# Patient Record
Sex: Male | Born: 1935
Health system: Southern US, Community
[De-identification: ages and names within clinical notes are randomized; demographics above are authoritative.]

## PROBLEM LIST (undated history)

## (undated) DIAGNOSIS — C61 Malignant neoplasm of prostate: Secondary | ICD-10-CM

## (undated) DIAGNOSIS — I340 Nonrheumatic mitral (valve) insufficiency: Secondary | ICD-10-CM

## (undated) DIAGNOSIS — R351 Nocturia: Secondary | ICD-10-CM

## (undated) DIAGNOSIS — M76899 Other specified enthesopathies of unspecified lower limb, excluding foot: Secondary | ICD-10-CM

## (undated) DIAGNOSIS — E785 Hyperlipidemia, unspecified: Secondary | ICD-10-CM

## (undated) DIAGNOSIS — M1990 Unspecified osteoarthritis, unspecified site: Secondary | ICD-10-CM

## (undated) DIAGNOSIS — I499 Cardiac arrhythmia, unspecified: Secondary | ICD-10-CM

## (undated) DIAGNOSIS — I7409 Other arterial embolism and thrombosis of abdominal aorta: Secondary | ICD-10-CM

## (undated) DIAGNOSIS — R002 Palpitations: Secondary | ICD-10-CM

## (undated) DIAGNOSIS — M720 Palmar fascial fibromatosis [Dupuytren]: Secondary | ICD-10-CM

## (undated) DIAGNOSIS — K5900 Constipation, unspecified: Secondary | ICD-10-CM

## (undated) DIAGNOSIS — R350 Frequency of micturition: Secondary | ICD-10-CM

## (undated) DIAGNOSIS — E039 Hypothyroidism, unspecified: Secondary | ICD-10-CM

## (undated) DIAGNOSIS — R112 Nausea with vomiting, unspecified: Secondary | ICD-10-CM

## (undated) DIAGNOSIS — K219 Gastro-esophageal reflux disease without esophagitis: Secondary | ICD-10-CM

## (undated) DIAGNOSIS — S069X9A Unspecified intracranial injury with loss of consciousness of unspecified duration, initial encounter: Secondary | ICD-10-CM

## (undated) DIAGNOSIS — T4145XA Adverse effect of unspecified anesthetic, initial encounter: Secondary | ICD-10-CM

## (undated) DIAGNOSIS — R011 Cardiac murmur, unspecified: Secondary | ICD-10-CM

## (undated) DIAGNOSIS — I10 Essential (primary) hypertension: Secondary | ICD-10-CM

## (undated) DIAGNOSIS — Z9889 Other specified postprocedural states: Secondary | ICD-10-CM

## (undated) DIAGNOSIS — Q211 Atrial septal defect: Secondary | ICD-10-CM

## (undated) DIAGNOSIS — I779 Disorder of arteries and arterioles, unspecified: Secondary | ICD-10-CM

## (undated) DIAGNOSIS — M25569 Pain in unspecified knee: Secondary | ICD-10-CM

## (undated) DIAGNOSIS — N400 Enlarged prostate without lower urinary tract symptoms: Secondary | ICD-10-CM

## (undated) DIAGNOSIS — I739 Peripheral vascular disease, unspecified: Secondary | ICD-10-CM

## (undated) DIAGNOSIS — H25019 Cortical age-related cataract, unspecified eye: Secondary | ICD-10-CM

## (undated) HISTORY — DX: Constipation, unspecified: K59.00

## (undated) HISTORY — DX: Nonrheumatic mitral (valve) insufficiency: I34.0

## (undated) HISTORY — DX: Hyperlipidemia, unspecified: E78.5

## (undated) HISTORY — DX: Hypothyroidism, unspecified: E03.9

## (undated) HISTORY — DX: Essential (primary) hypertension: I10

## (undated) HISTORY — DX: Palpitations: R00.2

## (undated) HISTORY — DX: Pain in unspecified knee: M25.569

## (undated) HISTORY — DX: Benign prostatic hyperplasia without lower urinary tract symptoms: N40.0

## (undated) HISTORY — PX: COLONOSCOPY: SHX174

## (undated) HISTORY — DX: Malignant neoplasm of prostate: C61

## (undated) HISTORY — DX: Frequency of micturition: R35.0

## (undated) HISTORY — DX: Disorder of arteries and arterioles, unspecified: I77.9

## (undated) HISTORY — DX: Unspecified osteoarthritis, unspecified site: M19.90

## (undated) HISTORY — DX: Peripheral vascular disease, unspecified: I73.9

## (undated) HISTORY — DX: Cortical age-related cataract, unspecified eye: H25.019

## (undated) HISTORY — DX: Other specified enthesopathies of unspecified lower limb, excluding foot: M76.899

## (undated) HISTORY — DX: Nocturia: R35.1

## (undated) HISTORY — DX: Palmar fascial fibromatosis (dupuytren): M72.0

## (undated) HISTORY — DX: Other arterial embolism and thrombosis of abdominal aorta: I74.09

---
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
## 1966-10-22 HISTORY — PX: KNEE ARTHROSCOPY WITH MENISCAL REPAIR: SHX5653

## 1998-10-22 HISTORY — PX: PROSTATE SURGERY: SHX751

## 1999-10-23 DIAGNOSIS — S069X9A Unspecified intracranial injury with loss of consciousness of unspecified duration, initial encounter: Secondary | ICD-10-CM

## 1999-10-23 HISTORY — DX: Unspecified intracranial injury with loss of consciousness of unspecified duration, initial encounter: S06.9X9A

## 2004-10-22 HISTORY — PX: CATARACT EXTRACTION W/ INTRAOCULAR LENS  IMPLANT, BILATERAL: SHX1307

## 2008-08-14 DIAGNOSIS — R350 Frequency of micturition: Secondary | ICD-10-CM

## 2008-08-14 HISTORY — DX: Frequency of micturition: R35.0

## 2009-11-27 DIAGNOSIS — M76899 Other specified enthesopathies of unspecified lower limb, excluding foot: Secondary | ICD-10-CM

## 2009-11-27 HISTORY — DX: Other specified enthesopathies of unspecified lower limb, excluding foot: M76.899

## 2010-06-21 DIAGNOSIS — E785 Hyperlipidemia, unspecified: Secondary | ICD-10-CM

## 2010-06-21 DIAGNOSIS — M720 Palmar fascial fibromatosis [Dupuytren]: Secondary | ICD-10-CM | POA: Insufficient documentation

## 2010-06-21 DIAGNOSIS — C61 Malignant neoplasm of prostate: Secondary | ICD-10-CM

## 2010-06-21 DIAGNOSIS — M1990 Unspecified osteoarthritis, unspecified site: Secondary | ICD-10-CM

## 2010-06-21 DIAGNOSIS — I10 Essential (primary) hypertension: Secondary | ICD-10-CM

## 2010-06-21 DIAGNOSIS — K5900 Constipation, unspecified: Secondary | ICD-10-CM

## 2010-06-21 DIAGNOSIS — N400 Enlarged prostate without lower urinary tract symptoms: Secondary | ICD-10-CM

## 2010-06-21 HISTORY — DX: Unspecified osteoarthritis, unspecified site: M19.90

## 2010-06-21 HISTORY — DX: Essential (primary) hypertension: I10

## 2010-06-21 HISTORY — DX: Benign prostatic hyperplasia without lower urinary tract symptoms: N40.0

## 2010-06-21 HISTORY — DX: Constipation, unspecified: K59.00

## 2010-06-21 HISTORY — DX: Hyperlipidemia, unspecified: E78.5

## 2010-06-21 HISTORY — DX: Malignant neoplasm of prostate: C61

## 2010-06-21 HISTORY — DX: Palmar fascial fibromatosis (dupuytren): M72.0

## 2010-08-14 DIAGNOSIS — R002 Palpitations: Secondary | ICD-10-CM

## 2010-08-14 HISTORY — DX: Palpitations: R00.2

## 2011-08-27 DIAGNOSIS — E039 Hypothyroidism, unspecified: Secondary | ICD-10-CM

## 2011-08-27 HISTORY — DX: Hypothyroidism, unspecified: E03.9

## 2011-11-14 DIAGNOSIS — L819 Disorder of pigmentation, unspecified: Secondary | ICD-10-CM | POA: Diagnosis not present

## 2011-11-14 DIAGNOSIS — D485 Neoplasm of uncertain behavior of skin: Secondary | ICD-10-CM | POA: Diagnosis not present

## 2011-11-14 DIAGNOSIS — R238 Other skin changes: Secondary | ICD-10-CM | POA: Diagnosis not present

## 2011-11-14 DIAGNOSIS — L980 Pyogenic granuloma: Secondary | ICD-10-CM | POA: Diagnosis not present

## 2011-12-17 DIAGNOSIS — L821 Other seborrheic keratosis: Secondary | ICD-10-CM | POA: Diagnosis not present

## 2011-12-17 DIAGNOSIS — L570 Actinic keratosis: Secondary | ICD-10-CM | POA: Diagnosis not present

## 2011-12-17 DIAGNOSIS — D485 Neoplasm of uncertain behavior of skin: Secondary | ICD-10-CM | POA: Diagnosis not present

## 2012-01-16 DIAGNOSIS — H903 Sensorineural hearing loss, bilateral: Secondary | ICD-10-CM | POA: Diagnosis not present

## 2012-02-25 DIAGNOSIS — I10 Essential (primary) hypertension: Secondary | ICD-10-CM | POA: Diagnosis not present

## 2012-02-25 DIAGNOSIS — D62 Acute posthemorrhagic anemia: Secondary | ICD-10-CM | POA: Diagnosis not present

## 2012-02-25 DIAGNOSIS — M25569 Pain in unspecified knee: Secondary | ICD-10-CM

## 2012-02-25 DIAGNOSIS — E785 Hyperlipidemia, unspecified: Secondary | ICD-10-CM | POA: Diagnosis not present

## 2012-02-25 HISTORY — DX: Pain in unspecified knee: M25.569

## 2012-02-28 DIAGNOSIS — R002 Palpitations: Secondary | ICD-10-CM | POA: Diagnosis not present

## 2012-02-28 DIAGNOSIS — E785 Hyperlipidemia, unspecified: Secondary | ICD-10-CM | POA: Diagnosis not present

## 2012-02-28 DIAGNOSIS — I10 Essential (primary) hypertension: Secondary | ICD-10-CM | POA: Diagnosis not present

## 2012-03-05 DIAGNOSIS — C61 Malignant neoplasm of prostate: Secondary | ICD-10-CM | POA: Diagnosis not present

## 2012-03-12 DIAGNOSIS — R350 Frequency of micturition: Secondary | ICD-10-CM | POA: Diagnosis not present

## 2012-03-12 DIAGNOSIS — L820 Inflamed seborrheic keratosis: Secondary | ICD-10-CM | POA: Diagnosis not present

## 2012-03-12 DIAGNOSIS — C61 Malignant neoplasm of prostate: Secondary | ICD-10-CM | POA: Diagnosis not present

## 2012-03-12 DIAGNOSIS — N401 Enlarged prostate with lower urinary tract symptoms: Secondary | ICD-10-CM | POA: Diagnosis not present

## 2012-04-21 DIAGNOSIS — L820 Inflamed seborrheic keratosis: Secondary | ICD-10-CM | POA: Diagnosis not present

## 2012-05-01 DIAGNOSIS — C4441 Basal cell carcinoma of skin of scalp and neck: Secondary | ICD-10-CM | POA: Diagnosis not present

## 2012-06-20 DIAGNOSIS — C44211 Basal cell carcinoma of skin of unspecified ear and external auricular canal: Secondary | ICD-10-CM | POA: Diagnosis not present

## 2012-07-29 DIAGNOSIS — M2040 Other hammer toe(s) (acquired), unspecified foot: Secondary | ICD-10-CM | POA: Diagnosis not present

## 2012-07-29 DIAGNOSIS — B351 Tinea unguium: Secondary | ICD-10-CM | POA: Diagnosis not present

## 2012-07-29 DIAGNOSIS — L600 Ingrowing nail: Secondary | ICD-10-CM | POA: Diagnosis not present

## 2012-08-05 DIAGNOSIS — Z23 Encounter for immunization: Secondary | ICD-10-CM | POA: Diagnosis not present

## 2012-08-19 DIAGNOSIS — I4891 Unspecified atrial fibrillation: Secondary | ICD-10-CM | POA: Diagnosis not present

## 2012-08-19 DIAGNOSIS — K219 Gastro-esophageal reflux disease without esophagitis: Secondary | ICD-10-CM | POA: Diagnosis not present

## 2012-08-19 DIAGNOSIS — I6529 Occlusion and stenosis of unspecified carotid artery: Secondary | ICD-10-CM | POA: Diagnosis not present

## 2012-08-19 DIAGNOSIS — E785 Hyperlipidemia, unspecified: Secondary | ICD-10-CM | POA: Diagnosis not present

## 2012-08-26 DIAGNOSIS — R002 Palpitations: Secondary | ICD-10-CM | POA: Diagnosis not present

## 2012-08-26 DIAGNOSIS — I10 Essential (primary) hypertension: Secondary | ICD-10-CM | POA: Diagnosis not present

## 2012-08-26 DIAGNOSIS — E785 Hyperlipidemia, unspecified: Secondary | ICD-10-CM | POA: Diagnosis not present

## 2012-09-01 DIAGNOSIS — C61 Malignant neoplasm of prostate: Secondary | ICD-10-CM | POA: Diagnosis not present

## 2012-09-01 DIAGNOSIS — M76899 Other specified enthesopathies of unspecified lower limb, excluding foot: Secondary | ICD-10-CM | POA: Diagnosis not present

## 2012-09-01 DIAGNOSIS — I10 Essential (primary) hypertension: Secondary | ICD-10-CM | POA: Diagnosis not present

## 2012-09-01 DIAGNOSIS — E785 Hyperlipidemia, unspecified: Secondary | ICD-10-CM | POA: Diagnosis not present

## 2012-09-05 ENCOUNTER — Ambulatory Visit (INDEPENDENT_AMBULATORY_CARE_PROVIDER_SITE_OTHER): Payer: Medicare Other | Admitting: *Deleted

## 2012-09-05 DIAGNOSIS — I6529 Occlusion and stenosis of unspecified carotid artery: Secondary | ICD-10-CM

## 2012-11-19 DIAGNOSIS — D2310 Other benign neoplasm of skin of unspecified eyelid, including canthus: Secondary | ICD-10-CM | POA: Diagnosis not present

## 2012-12-15 DIAGNOSIS — Z85828 Personal history of other malignant neoplasm of skin: Secondary | ICD-10-CM | POA: Diagnosis not present

## 2012-12-15 DIAGNOSIS — L570 Actinic keratosis: Secondary | ICD-10-CM | POA: Diagnosis not present

## 2012-12-15 DIAGNOSIS — L821 Other seborrheic keratosis: Secondary | ICD-10-CM | POA: Diagnosis not present

## 2012-12-15 DIAGNOSIS — D485 Neoplasm of uncertain behavior of skin: Secondary | ICD-10-CM | POA: Diagnosis not present

## 2013-03-06 DIAGNOSIS — C61 Malignant neoplasm of prostate: Secondary | ICD-10-CM | POA: Diagnosis not present

## 2013-03-20 DIAGNOSIS — N401 Enlarged prostate with lower urinary tract symptoms: Secondary | ICD-10-CM | POA: Diagnosis not present

## 2013-03-20 DIAGNOSIS — C61 Malignant neoplasm of prostate: Secondary | ICD-10-CM | POA: Diagnosis not present

## 2013-05-29 ENCOUNTER — Other Ambulatory Visit: Payer: Self-pay | Admitting: Geriatric Medicine

## 2013-06-08 DIAGNOSIS — Z85828 Personal history of other malignant neoplasm of skin: Secondary | ICD-10-CM | POA: Diagnosis not present

## 2013-06-08 DIAGNOSIS — L570 Actinic keratosis: Secondary | ICD-10-CM | POA: Diagnosis not present

## 2013-06-23 ENCOUNTER — Other Ambulatory Visit: Payer: Self-pay | Admitting: Geriatric Medicine

## 2013-07-04 ENCOUNTER — Other Ambulatory Visit: Payer: Self-pay | Admitting: Internal Medicine

## 2013-07-27 DIAGNOSIS — Z23 Encounter for immunization: Secondary | ICD-10-CM | POA: Diagnosis not present

## 2013-08-11 ENCOUNTER — Encounter: Payer: Self-pay | Admitting: *Deleted

## 2013-08-17 DIAGNOSIS — M720 Palmar fascial fibromatosis [Dupuytren]: Secondary | ICD-10-CM | POA: Diagnosis not present

## 2013-08-17 DIAGNOSIS — M19049 Primary osteoarthritis, unspecified hand: Secondary | ICD-10-CM | POA: Diagnosis not present

## 2013-08-18 DIAGNOSIS — I4891 Unspecified atrial fibrillation: Secondary | ICD-10-CM | POA: Diagnosis not present

## 2013-08-18 DIAGNOSIS — I6529 Occlusion and stenosis of unspecified carotid artery: Secondary | ICD-10-CM | POA: Diagnosis not present

## 2013-08-18 DIAGNOSIS — K219 Gastro-esophageal reflux disease without esophagitis: Secondary | ICD-10-CM | POA: Diagnosis not present

## 2013-08-18 DIAGNOSIS — E785 Hyperlipidemia, unspecified: Secondary | ICD-10-CM | POA: Diagnosis not present

## 2013-09-01 DIAGNOSIS — E039 Hypothyroidism, unspecified: Secondary | ICD-10-CM | POA: Diagnosis not present

## 2013-09-01 DIAGNOSIS — I10 Essential (primary) hypertension: Secondary | ICD-10-CM | POA: Diagnosis not present

## 2013-09-01 DIAGNOSIS — N400 Enlarged prostate without lower urinary tract symptoms: Secondary | ICD-10-CM | POA: Diagnosis not present

## 2013-09-01 DIAGNOSIS — E785 Hyperlipidemia, unspecified: Secondary | ICD-10-CM | POA: Diagnosis not present

## 2013-09-04 ENCOUNTER — Encounter: Payer: Self-pay | Admitting: *Deleted

## 2013-09-07 ENCOUNTER — Encounter: Payer: Self-pay | Admitting: Internal Medicine

## 2013-10-12 ENCOUNTER — Encounter: Payer: Self-pay | Admitting: Internal Medicine

## 2013-10-12 ENCOUNTER — Non-Acute Institutional Stay: Payer: Medicare Other | Admitting: Internal Medicine

## 2013-10-12 VITALS — BP 130/82 | HR 56 | Resp 12 | Ht 68.03 in | Wt 160.0 lb

## 2013-10-12 DIAGNOSIS — E785 Hyperlipidemia, unspecified: Secondary | ICD-10-CM

## 2013-10-12 DIAGNOSIS — C61 Malignant neoplasm of prostate: Secondary | ICD-10-CM | POA: Diagnosis not present

## 2013-10-12 DIAGNOSIS — I10 Essential (primary) hypertension: Secondary | ICD-10-CM | POA: Diagnosis not present

## 2013-10-12 DIAGNOSIS — E039 Hypothyroidism, unspecified: Secondary | ICD-10-CM | POA: Diagnosis not present

## 2013-10-12 DIAGNOSIS — M76899 Other specified enthesopathies of unspecified lower limb, excluding foot: Secondary | ICD-10-CM

## 2013-10-12 DIAGNOSIS — M720 Palmar fascial fibromatosis [Dupuytren]: Secondary | ICD-10-CM

## 2013-10-12 NOTE — Progress Notes (Signed)
Patient ID: Samuel [name], male   DOB: 09/15/1932, 79 y.o.   MRN: [id number]    Nursing Home Location:  [facility] [employer] [facility] Service: Clinic (12)  PCP: Kimber Relic, MD  Code Status: LIVING WILL  No Known Allergies  Chief Complaint  Patient presents with  . Annual Exam    Yearly check-up, Heart followed by Dr.Tilley     HPI:   Patient says he is doing well. He has no specific complaints today. he presents for complete exam and review of medical problems  Unspecified hypothyroidism: Controlled  Malignant neoplasm of prostate: No evidence of relapse  Other and unspecified hyperlipidemia: Controlled  Unspecified essential hypertension: Controlled  Contracture of palmar fascia: Stable without change  Enthesopathy of hip region: Improved.  Past Medical History  Diagnosis Date  . Pain in joint, lower leg 02/25/2012  . Unspecified hypothyroidism 08/27/2011  . Palpitations 08/14/2010  . Cortical senile cataract 06/27/2010  . Malignant neoplasm of prostate 06/21/2010  . Other and unspecified hyperlipidemia 06/21/2010  . Unspecified essential hypertension 06/21/2010  . Unspecified constipation 06/21/2010  . Hypertrophy of prostate without urinary obstruction and other lower urinary tract symptoms (LUTS) 06/21/2010  . Osteoarthrosis, unspecified whether generalized or localized, unspecified site 06/21/2010  . Contracture of palmar fascia 06/21/2010  . Enthesopathy of hip region 11/27/2009  . Urinary frequency 08/14/2008  . Nocturia 2011    Past Surgical History  Procedure Laterality Date  . Total knee arthroplasty Right 1955    Dr. Sandra Cockayne  . Knee arthroscopy with meniscal repair Left 1968  . Cataract extraction w/ intraocular lens  implant, bilateral  2006    CONSULTANTS Cardiology: Donnie Aho  PAST PROCEDURES Urology: Heloise Purpura  Social History: History   Social History  . Marital Status: Married    Spouse Name: N/A    Number of Children: N/A    . Years of Education: N/A   Occupational History  . retired Multimedia programmer at [employer] History Main Topics  . Smoking status: Former Smoker -- 5 years    Types: Cigarettes    Quit date: 09/04/1969  . Smokeless tobacco: Never Used  . Alcohol Use: Yes     Comment: 2 daily  . Drug Use: No  . Sexual Activity: None   Other Topics Concern  . None   Social History Narrative   Lives at [facility] since 05/2010   Has living will   Retired from [employer] after 29 years    Family History Family Status  Relation Status Death Age  . Mother Deceased   . Father Deceased   . Sister Alive   . Daughter Alive   . Son Alive    Family History  Problem Relation Age of Onset  . Heart disease Mother     myocardial infarction  . Heart disease Father     myocardial infarction     Medications: Patient's Medications  New Prescriptions   No medications on file  Previous Medications   ALFUZOSIN (UROXATRAL) 10 MG 24 HR TABLET    Take 10 mg by mouth daily with breakfast. Take one daily to reduce bladder spasm   ASPIRIN 81 MG TABLET    Take 81 mg by mouth. Take one daily for anticoagulation   METOPROLOL SUCCINATE (TOPROL-XL) 25 MG 24 HR TABLET    TAKE ONE-HALF (1/2) TABLET EVERY OTHER DAY   NABUMETONE (RELAFEN) 750 MG TABLET    TAKE 1 TABLET TWICE A DAY FOR  SWELLING   NAPROXEN SODIUM (ANAPROX) 220 MG TABLET    Take 220 mg by mouth. Take one tablet daily as needed for pain   SIMVASTATIN (ZOCOR) 20 MG TABLET    TAKE 1 TABLET DAILY TO MAINTAIN LOW CHOLESTEROL   SOLIFENACIN (VESICARE) 10 MG TABLET    Take by mouth daily. Take one daily for bladder control  Modified Medications   No medications on file  Discontinued Medications   No medications on file    Immunization History  Administered Date(s) Administered  . Influenza-Unspecified 07/27/2013  . Td 10/23/2007  . Zoster 10/22/2008     Review of Systems  Constitutional: Negative for fever, chills, weight loss,  malaise/fatigue and diaphoresis.  HENT: Positive for hearing loss. Negative for congestion, ear discharge, ear pain, nosebleeds, sore throat and tinnitus.   Eyes: Negative for blurred vision, double vision, photophobia, pain, discharge and redness.  Respiratory: Negative for cough, hemoptysis, sputum production, shortness of breath, wheezing and stridor.   Cardiovascular: Negative for chest pain, palpitations, orthopnea, claudication, leg swelling and PND.  Gastrointestinal: Negative for heartburn, nausea, vomiting, abdominal pain, diarrhea, constipation, blood in stool and melena.  Genitourinary:       History prostate cancer  Musculoskeletal: Negative for back pain, falls, joint pain, myalgias and neck pain.  Skin: Negative for itching and rash.  Neurological: Negative for dizziness, tingling, tremors, sensory change, speech change, focal weakness, loss of consciousness, weakness and headaches.  Endo/Heme/Allergies: Negative for environmental allergies and polydipsia. Does not bruise/bleed easily.  Psychiatric/Behavioral: Negative.      Filed Vitals:   10/12/13 1429  BP: 130/82  Pulse: 56  Resp: 12  Height: 5' 8.03" (1.728 m)  Weight: 160 lb (72.576 kg)  SpO2: 93%   Physical Exam  Constitutional: He is oriented to person, place, and time. He appears well-developed and well-nourished. No distress.  HENT:  Nose: Nose normal.  Mouth/Throat: Oropharynx is clear and moist. No oropharyngeal exudate.  Eyes: Right eye exhibits no discharge. Left eye exhibits no discharge. No scleral icterus.  Neck: Neck supple. No JVD present. No tracheal deviation present. No thyromegaly present.  Cardiovascular: Normal rate, regular rhythm and intact distal pulses.  Exam reveals no gallop and no friction rub.   No murmur heard. Pulmonary/Chest: No respiratory distress. He has no wheezes. He has no rales. He exhibits no tenderness.  Abdominal: Bowel sounds are normal. He exhibits no distension and no  mass. There is no tenderness. There is no rebound and no guarding.  Musculoskeletal: Normal range of motion. He exhibits no edema and no tenderness.  Lymphadenopathy:    He has no cervical adenopathy.  Neurological: He is oriented to person, place, and time. No cranial nerve deficit. Coordination normal.  Skin: Skin is dry. No rash noted. He is not diaphoretic. No erythema. No pallor.  Psychiatric: He has a normal mood and affect. His behavior is normal. Judgment and thought content normal.   Physical Exam  Constitutional: He is oriented to person, place, and time. He appears well-developed and well-nourished. No distress.  HENT:  Nose: Nose normal.  Mouth/Throat: Oropharynx is clear and moist. No oropharyngeal exudate.  Eyes: Right eye exhibits no discharge. Left eye exhibits no discharge. No scleral icterus.  Neck: Neck supple. No JVD present. No tracheal deviation present. No thyromegaly present.  Cardiovascular: Normal rate, regular rhythm and intact distal pulses.  Exam reveals no gallop and no friction rub.   No murmur heard. Respiratory: No respiratory distress. He has no wheezes. He  has no rales. He exhibits no tenderness.  GI: Bowel sounds are normal. He exhibits no distension and no mass. There is no tenderness. There is no rebound and no guarding.  Musculoskeletal: Normal range of motion. He exhibits no edema and no tenderness.  Lymphadenopathy:    He has no cervical adenopathy.  Neurological: He is oriented to person, place, and time. No cranial nerve deficit. Coordination normal.  Skin: Skin is dry. No rash noted. He is not diaphoretic. No erythema. No pallor.  Psychiatric: He has a normal mood and affect. His behavior is normal. Judgment and thought content normal.      Labs reviewed: 02/22/10: [facility] [place]. WBC 4.1, Hgb. 14.1, Hct. 40.9, Plt 190 B12 333, folat 15.3 TC 159, TG 80, HDL 70, LDL 73 Glu 103, BUN 19, Cr.1.03, Na 140, K+ 4.6, Protein /LFTs WNL. TSH  4.32 PSA 0.1,  Testosterone, Total 539 (WNL), Free Testosterone 60.9(WNL) U/A- neg. Glucose, protein, ketone, blood, nitrite 08/14/11 TSH: 5.227 08/26/2012 CMP: Sodium 140, Potassium 4.8, glucose 79, BUN 17, Creatinine 1.05 Lipid: cholesterol 178, triglyceride 68, HDL 65, LDL 99 TSH 4.333  16/10/96 CMP: nl   Lipids: TC 171, trig 73, HDL 62, LDL 73   TSH 6.211  PSA 0.01  Assessment/Plan Unspecified hypothyroidism: mild elevation in TSH  Malignant neoplasm of prostate; no evidence of relapse  Other and unspecified hyperlipidemia: controlled  Unspecified essential hypertension: controlled  Contracture of palmar fascia: unchanged  Enthesopathy of hip region: resolved

## 2013-11-06 ENCOUNTER — Encounter: Payer: Self-pay | Admitting: Internal Medicine

## 2013-11-14 ENCOUNTER — Other Ambulatory Visit: Payer: Self-pay | Admitting: Geriatric Medicine

## 2013-12-30 DIAGNOSIS — H2640 Unspecified secondary cataract: Secondary | ICD-10-CM | POA: Diagnosis not present

## 2014-01-13 DIAGNOSIS — D1801 Hemangioma of skin and subcutaneous tissue: Secondary | ICD-10-CM | POA: Diagnosis not present

## 2014-01-13 DIAGNOSIS — L821 Other seborrheic keratosis: Secondary | ICD-10-CM | POA: Diagnosis not present

## 2014-01-13 DIAGNOSIS — L570 Actinic keratosis: Secondary | ICD-10-CM | POA: Diagnosis not present

## 2014-01-17 ENCOUNTER — Other Ambulatory Visit: Payer: Self-pay | Admitting: Internal Medicine

## 2014-01-26 DIAGNOSIS — E785 Hyperlipidemia, unspecified: Secondary | ICD-10-CM | POA: Diagnosis not present

## 2014-01-26 LAB — TSH: TSH: 8.42 u[IU]/mL — AB (ref 0.41–5.90)

## 2014-01-27 ENCOUNTER — Other Ambulatory Visit: Payer: Self-pay | Admitting: Geriatric Medicine

## 2014-02-05 ENCOUNTER — Other Ambulatory Visit: Payer: Self-pay

## 2014-02-05 ENCOUNTER — Telehealth: Payer: Self-pay

## 2014-02-05 MED ORDER — LEVOTHYROXINE SODIUM 25 MCG PO TABS: ORAL_TABLET | ORAL | Status: DC

## 2014-02-05 NOTE — Telephone Encounter (Signed)
Dr. Nyoka Cowden received  results from 01/26/14 TSH 8.422. Needs to start Levothyroxine 25 mcq one daily. Dr. Nyoka Cowden wrote for #90 with 3 refills repeat TSH in 2 months. Patient want #30, 3 refills. Faxed to [facility]. Repeat 04/08/14.

## 2014-03-24 DIAGNOSIS — C61 Malignant neoplasm of prostate: Secondary | ICD-10-CM | POA: Diagnosis not present

## 2014-03-31 ENCOUNTER — Other Ambulatory Visit: Payer: Self-pay | Admitting: Internal Medicine

## 2014-03-31 DIAGNOSIS — N401 Enlarged prostate with lower urinary tract symptoms: Secondary | ICD-10-CM | POA: Diagnosis not present

## 2014-03-31 DIAGNOSIS — N139 Obstructive and reflux uropathy, unspecified: Secondary | ICD-10-CM | POA: Diagnosis not present

## 2014-03-31 DIAGNOSIS — R350 Frequency of micturition: Secondary | ICD-10-CM | POA: Diagnosis not present

## 2014-03-31 DIAGNOSIS — C61 Malignant neoplasm of prostate: Secondary | ICD-10-CM | POA: Diagnosis not present

## 2014-04-08 DIAGNOSIS — E039 Hypothyroidism, unspecified: Secondary | ICD-10-CM | POA: Diagnosis not present

## 2014-04-08 LAB — TSH: TSH: 4.19 u[IU]/mL (ref 0.41–5.90)

## 2014-04-14 ENCOUNTER — Telehealth: Payer: Self-pay

## 2014-04-14 MED ORDER — LEVOTHYROXINE SODIUM 25 MCG PO TABS: ORAL_TABLET | ORAL | Status: DC

## 2014-04-14 NOTE — Telephone Encounter (Signed)
Received lab 04/08/14 TSH 4.192, per Hassell Done, NP continue same dose of Levothyroxine 39mcg. Called patient, needs new Rx for 90 day supply with 3 refills for mail order. Rx written. Has appt with Dr. Nyoka Cowden for CPX 10/18/14. Will pick up Rx at clinic.

## 2014-04-27 ENCOUNTER — Telehealth: Payer: Self-pay

## 2014-04-27 NOTE — Telephone Encounter (Signed)
Patient received e-mail from [facility] about his Levothyroxine prescription written 04/14/14. Called [facility] at [phone], they had contacted patient about his Insurance and other programs he's in, and did he want brand or generic. Because he did not get in  touch with them they cancel the Rx. Was transfer to Pharm gave Rx verbally. They will mail Rx, patient should receive it in 8-10 days. Called patient and explain to him.

## 2014-04-29 ENCOUNTER — Encounter: Payer: Self-pay | Admitting: Internal Medicine

## 2014-07-07 DIAGNOSIS — Z23 Encounter for immunization: Secondary | ICD-10-CM | POA: Diagnosis not present

## 2014-08-05 DIAGNOSIS — L821 Other seborrheic keratosis: Secondary | ICD-10-CM | POA: Diagnosis not present

## 2014-08-05 DIAGNOSIS — D098 Carcinoma in situ of other specified sites: Secondary | ICD-10-CM | POA: Diagnosis not present

## 2014-08-05 DIAGNOSIS — D0439 Carcinoma in situ of skin of other parts of face: Secondary | ICD-10-CM | POA: Diagnosis not present

## 2014-08-05 DIAGNOSIS — L918 Other hypertrophic disorders of the skin: Secondary | ICD-10-CM | POA: Diagnosis not present

## 2014-08-20 ENCOUNTER — Encounter: Payer: Self-pay | Admitting: Internal Medicine

## 2014-08-31 DIAGNOSIS — I482 Chronic atrial fibrillation: Secondary | ICD-10-CM | POA: Diagnosis not present

## 2014-08-31 DIAGNOSIS — E785 Hyperlipidemia, unspecified: Secondary | ICD-10-CM | POA: Diagnosis not present

## 2014-08-31 DIAGNOSIS — I6529 Occlusion and stenosis of unspecified carotid artery: Secondary | ICD-10-CM | POA: Diagnosis not present

## 2014-08-31 DIAGNOSIS — I480 Paroxysmal atrial fibrillation: Secondary | ICD-10-CM | POA: Diagnosis not present

## 2014-08-31 DIAGNOSIS — K219 Gastro-esophageal reflux disease without esophagitis: Secondary | ICD-10-CM | POA: Diagnosis not present

## 2014-09-22 DIAGNOSIS — C44321 Squamous cell carcinoma of skin of nose: Secondary | ICD-10-CM | POA: Diagnosis not present

## 2014-10-12 DIAGNOSIS — I10 Essential (primary) hypertension: Secondary | ICD-10-CM | POA: Diagnosis not present

## 2014-10-12 DIAGNOSIS — E785 Hyperlipidemia, unspecified: Secondary | ICD-10-CM | POA: Diagnosis not present

## 2014-10-12 DIAGNOSIS — T50905A Adverse effect of unspecified drugs, medicaments and biological substances, initial encounter: Secondary | ICD-10-CM | POA: Diagnosis not present

## 2014-10-12 LAB — HEPATIC FUNCTION PANEL
ALK PHOS: 71 U/L (ref 25–125)
ALT: 27 U/L (ref 10–40)
AST: 29 U/L (ref 14–40)
Bilirubin, Total: 0.6 mg/dL

## 2014-10-12 LAB — LIPID PANEL
Cholesterol: 164 mg/dL (ref 0–200)
HDL: 73 mg/dL — AB (ref 35–70)
LDL Cholesterol: 71 mg/dL
LDl/HDL Ratio: 2.2
Triglycerides: 59 mg/dL (ref 40–160)

## 2014-10-12 LAB — BASIC METABOLIC PANEL
BUN: 18 mg/dL (ref 4–21)
Creatinine: 1.1 mg/dL (ref 0.6–1.3)
Glucose: 83 mg/dL
Potassium: 4.1 mmol/L (ref 3.4–5.3)
Sodium: 139 mmol/L (ref 137–147)

## 2014-10-12 LAB — TSH: TSH: 4.39 u[IU]/mL (ref 0.41–5.90)

## 2014-10-15 ENCOUNTER — Other Ambulatory Visit: Payer: Self-pay | Admitting: Internal Medicine

## 2014-10-18 ENCOUNTER — Encounter: Payer: Self-pay | Admitting: Internal Medicine

## 2014-10-26 ENCOUNTER — Encounter: Payer: Self-pay | Admitting: Internal Medicine

## 2014-10-26 ENCOUNTER — Non-Acute Institutional Stay: Payer: Medicare Other | Admitting: Internal Medicine

## 2014-10-26 VITALS — BP 132/76 | HR 62 | Temp 97.7°F | Ht 68.0 in | Wt 164.0 lb

## 2014-10-26 DIAGNOSIS — M170 Bilateral primary osteoarthritis of knee: Secondary | ICD-10-CM | POA: Diagnosis not present

## 2014-10-26 DIAGNOSIS — E785 Hyperlipidemia, unspecified: Secondary | ICD-10-CM

## 2014-10-26 DIAGNOSIS — I10 Essential (primary) hypertension: Secondary | ICD-10-CM

## 2014-10-26 DIAGNOSIS — E039 Hypothyroidism, unspecified: Secondary | ICD-10-CM | POA: Diagnosis not present

## 2014-10-26 DIAGNOSIS — R350 Frequency of micturition: Secondary | ICD-10-CM

## 2014-10-26 DIAGNOSIS — C61 Malignant neoplasm of prostate: Secondary | ICD-10-CM

## 2014-10-26 DIAGNOSIS — Z23 Encounter for immunization: Secondary | ICD-10-CM
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
## 2014-10-26 NOTE — Progress Notes (Deleted)
Patient ID: Samuel [name], male   DOB: 1932-02-13, 81 y.o.   MRN: [id number]   Location: [facility]  No Known Allergies  Chief Complaint  Patient presents with  . Annual Exam    Comprehensive exam: thyroid, blood pressure, cholesterol    HPI: Patient is a 81 y.o. white male seen in the office today for his annual exam.    He has no concerns.    Review of Systems:  ROS   Past Medical History  Diagnosis Date  . Pain in joint, lower leg 02/25/2012  . Unspecified hypothyroidism 08/27/2011  . Palpitations 08/14/2010  . Cortical senile cataract 06/27/2010  . Malignant neoplasm of prostate 06/21/2010  . Other and unspecified hyperlipidemia 06/21/2010  . Unspecified essential hypertension 06/21/2010  . Unspecified constipation 06/21/2010  . Hypertrophy of prostate without urinary obstruction and other lower urinary tract symptoms (LUTS) 06/21/2010  . Osteoarthrosis, unspecified whether generalized or localized, unspecified site 06/21/2010  . Contracture of palmar fascia 06/21/2010  . Enthesopathy of hip region 11/27/2009  . Urinary frequency 08/14/2008  . Nocturia 2011    Past Surgical History  Procedure Laterality Date  . Total knee arthroplasty Right 1955    Dr. Ethel Rana  . Knee arthroscopy with meniscal repair Left 1968  . Cataract extraction w/ intraocular lens  implant, bilateral  2006    Social History:   reports that he quit smoking about 45 years ago. His smoking use included Cigarettes. He smoked 0.00 packs per day for 5 years. He has never used smokeless tobacco. He reports that he drinks about 1.2 oz of alcohol per week. He reports that he does not use illicit drugs.  Family History  Problem Relation Age of Onset  . Heart disease Mother     myocardial infarction  . Heart disease Father     myocardial infarction    Medications: Patient's Medications  New Prescriptions   No medications on file  Previous Medications   ALFUZOSIN (UROXATRAL) 10 MG 24 HR TABLET     Take 10 mg by mouth daily with breakfast. Take one daily to reduce bladder spasm   ASPIRIN 81 MG TABLET    Take 81 mg by mouth. Take one daily for anticoagulation   LEVOTHYROXINE (SYNTHROID, LEVOTHROID) 25 MCG TABLET    Take one tablet before breakfast daily for thyroid   METOPROLOL SUCCINATE (TOPROL-XL) 25 MG 24 HR TABLET    TAKE ONE-HALF (1/2) TABLET EVERY OTHER DAY   NABUMETONE (RELAFEN) 750 MG TABLET    TAKE 1 TABLET TWICE A DAY FOR SWELLING   NAPROXEN SODIUM (ANAPROX) 220 MG TABLET    Take 220 mg by mouth. Take one tablet daily as needed for pain   SIMVASTATIN (ZOCOR) 20 MG TABLET    TAKE 1 TABLET DAILY TO MAINTAIN LOW CHOLESTEROL   SOLIFENACIN (VESICARE) 10 MG TABLET    Take by mouth daily. Take one daily for bladder control  Modified Medications   No medications on file  Discontinued Medications   No medications on file     Physical Exam: Filed Vitals:   10/26/14 1439  BP: 132/76  Pulse: 62  Temp: 97.7 F (36.5 C)  TempSrc: Oral  Height: 5\' 8"  (1.727 m)  Weight: 164 lb (74.39 kg)  SpO2: 93%   ***(type .physexam)  Labs reviewed: Basic Metabolic Panel:  Recent Labs  01/26/14 04/08/14 10/12/14  NA  --   --  139  K  --   --  4.1  BUN  --   --  18  CREATININE  --   --  1.1  TSH 8.42* 4.19 4.39   Liver Function Tests:  Recent Labs  10/12/14  AST 29  ALT 27  ALKPHOS 71   No results for input(s): LIPASE, AMYLASE in the last 8760 hours. No results for input(s): AMMONIA in the last 8760 hours. CBC: No results for input(s): WBC, NEUTROABS, HGB, HCT, MCV, PLT in the last 8760 hours. Lipid Panel:  Recent Labs  10/12/14  CHOL 164  HDL 73*  LDLCALC 71  TRIG 59   No results found for: HGBA1C  Past Procedures:  Assessment/Plan No problem-specific assessment & plan notes found for this encounter.  Labs/tests ordered: Next appt:  Ahman Dugdale L. Raivyn Kabler, D.O. [facility] [address], [place] [address] Cell  Phone (Mon-Fri 8am-5pm):  [phone] On Call:  [phone] & follow prompts after 5pm & weekends Office Phone:  [phone] Office Fax:  [phone]

## 2014-11-05 ENCOUNTER — Encounter: Payer: Self-pay | Admitting: Internal Medicine

## 2015-01-14 DIAGNOSIS — L814 Other melanin hyperpigmentation: Secondary | ICD-10-CM | POA: Diagnosis not present

## 2015-01-14 DIAGNOSIS — L570 Actinic keratosis: Secondary | ICD-10-CM | POA: Diagnosis not present

## 2015-01-14 DIAGNOSIS — L578 Other skin changes due to chronic exposure to nonionizing radiation: Secondary | ICD-10-CM | POA: Diagnosis not present

## 2015-01-14 DIAGNOSIS — L820 Inflamed seborrheic keratosis: Secondary | ICD-10-CM | POA: Diagnosis not present

## 2015-01-26 DIAGNOSIS — Z961 Presence of intraocular lens: Secondary | ICD-10-CM | POA: Diagnosis not present

## 2015-02-11 DIAGNOSIS — L578 Other skin changes due to chronic exposure to nonionizing radiation: Secondary | ICD-10-CM | POA: Diagnosis not present

## 2015-02-11 DIAGNOSIS — L570 Actinic keratosis: Secondary | ICD-10-CM | POA: Diagnosis not present

## 2015-02-11 DIAGNOSIS — L820 Inflamed seborrheic keratosis: Secondary | ICD-10-CM | POA: Diagnosis not present

## 2015-03-16 ENCOUNTER — Other Ambulatory Visit: Payer: Self-pay | Admitting: Internal Medicine

## 2015-03-29 DIAGNOSIS — C61 Malignant neoplasm of prostate: Secondary | ICD-10-CM | POA: Diagnosis not present

## 2015-04-05 DIAGNOSIS — N401 Enlarged prostate with lower urinary tract symptoms: Secondary | ICD-10-CM | POA: Diagnosis not present

## 2015-04-05 DIAGNOSIS — R350 Frequency of micturition: Secondary | ICD-10-CM | POA: Diagnosis not present

## 2015-04-05 DIAGNOSIS — R3916 Straining to void: Secondary | ICD-10-CM | POA: Diagnosis not present

## 2015-04-05 DIAGNOSIS — C61 Malignant neoplasm of prostate: Secondary | ICD-10-CM | POA: Diagnosis not present

## 2015-05-20 DIAGNOSIS — L814 Other melanin hyperpigmentation: Secondary | ICD-10-CM | POA: Diagnosis not present

## 2015-05-20 DIAGNOSIS — L578 Other skin changes due to chronic exposure to nonionizing radiation: Secondary | ICD-10-CM | POA: Diagnosis not present

## 2015-05-20 DIAGNOSIS — L570 Actinic keratosis: Secondary | ICD-10-CM | POA: Diagnosis not present

## 2015-08-04 DIAGNOSIS — Z85828 Personal history of other malignant neoplasm of skin: Secondary | ICD-10-CM | POA: Diagnosis not present

## 2015-08-04 DIAGNOSIS — L821 Other seborrheic keratosis: Secondary | ICD-10-CM | POA: Diagnosis not present

## 2015-08-04 DIAGNOSIS — D2239 Melanocytic nevi of other parts of face: Secondary | ICD-10-CM | POA: Diagnosis not present

## 2015-08-04 DIAGNOSIS — D225 Melanocytic nevi of trunk: Secondary | ICD-10-CM | POA: Diagnosis not present

## 2015-08-04 DIAGNOSIS — L814 Other melanin hyperpigmentation: Secondary | ICD-10-CM | POA: Diagnosis not present

## 2015-08-04 DIAGNOSIS — L570 Actinic keratosis: Secondary | ICD-10-CM | POA: Diagnosis not present

## 2015-08-11 DIAGNOSIS — Z23 Encounter for immunization: Secondary | ICD-10-CM | POA: Diagnosis not present

## 2015-09-05 ENCOUNTER — Encounter: Payer: Self-pay | Admitting: Cardiology

## 2015-09-05 DIAGNOSIS — K219 Gastro-esophageal reflux disease without esophagitis: Secondary | ICD-10-CM | POA: Diagnosis not present

## 2015-09-05 DIAGNOSIS — E785 Hyperlipidemia, unspecified: Secondary | ICD-10-CM | POA: Diagnosis not present

## 2015-09-05 DIAGNOSIS — I480 Paroxysmal atrial fibrillation: Secondary | ICD-10-CM | POA: Diagnosis not present

## 2015-09-05 DIAGNOSIS — R011 Cardiac murmur, unspecified: Secondary | ICD-10-CM | POA: Diagnosis not present

## 2015-09-05 DIAGNOSIS — I6529 Occlusion and stenosis of unspecified carotid artery: Secondary | ICD-10-CM | POA: Diagnosis not present

## 2015-09-05 NOTE — Progress Notes (Signed)
Patient ID: Samuel [name], male   DOB: 1932/06/07, 81 y.o.   MRN: [id number]   Samuel, [name]  Date of visit:  09/05/2015 DOB:  12-27-1931    Age:  81 yrs. Medical record number:  [id number]     Account number:  [id number] Primary Care Provider: REED,TIFFANY L ____________________________ CURRENT DIAGNOSES  1. Paroxysmal atrial fibrillation  2. Murmur  3. Hyperlipidemia, unspecified  4. Gastro-esophageal reflux disease without esophagitis  5. Occlusion and stenosis of unspecified carotid artery  6. Personal history of malignant neoplasm of prostate ____________________________ ALLERGIES  No Known Allergies ____________________________ MEDICATIONS  1. nabumetone 750 mg Tablet, BID  2. Uroxatral 10 mg Tablet Extended Release 24 hr, 1 p.o. daily  3. simvastatin 20 mg Tablet, 1 p.o. daily  4. nitroglycerin 0.4 mg tablet, sublingual, SL prn chest pain  5. Vesicare 5 mg tablet, 1 p.o. daily  6. aspirin 81 mg chewable tablet, 1 p.o. daily  7. metoprolol succinate ER 25 mg tablet,extended release 24 hr, 1/2 tab qod ____________________________ CHIEF COMPLAINTS  Followup of Paroxysmal atrial fibrillation ____________________________ HISTORY OF PRESENT ILLNESS Patient seen for cardiac followup. He has had a good year since he was here. He is active and is asymptomatic. He denies angina and has no PND, orthopnea, syncope, palpitations, or claudication. He has had no atrial fibrillation. Her He had a loud murmur on examination today that I have not heard previously. He does recall being told of heart murmur about 10 years ago prior to prostate surgery but has been asymptomatic. ____________________________ PAST HISTORY  Past Medical Illnesses:  syncope, prostate cancer treated with radiation and seed implants, anemia, hyperlipidemia, GERD, BPH;  Cardiovascular Illnesses:  atrial fibrillation;  Surgical Procedures:  tonsillectomy, knee surgery, bil;  NYHA Classification:  I;  Canadian Angina  Classification:  Class 0: Asymptomatic;  Cardiology Procedures-Invasive:  no history of prior cardiac procedures;  Cardiology Procedures-Noninvasive:  echocardiogram 2007, treadmill cardiolite 2008;  Peripheral Vascular Procedures:  carotid doppler November 2013 (<40% BILAT ica);  LVEF not documented,  CHADS Score:  1,  CHA2DS2-VASC Score:  2 ____________________________ CARDIO-PULMONARY TEST DATES EKG Date:  09/05/2015;  Nuclear Study Date:  04/02/2007;  Echocardiography Date: 05/15/2006;   ____________________________ FAMILY HISTORY Father -- Father dead Mother -- Mother dead Sister -- Sister alive and well ____________________________ SOCIAL HISTORY Alcohol Use:  2 1/2 oz rum qd;  Smoking:  used to smoke but quit;  Diet:  regular diet;  Lifestyle:  married, 1 son and 1 daughter;  Exercise:  walking for approximately 30 minutes 7 days per week;  Occupation:  retired and Corporate treasurer;  Residence:  lives with wife;   ____________________________ REVIEW OF SYSTEMS General:  denies recent weight change, fatique or change in exercise tolerance. Eyes: cataract extraction bilaterally Respiratory: denies dyspnea, cough, wheezing or hemoptysis. Cardiovascular:  please review HPI Abdominal: denies dyspepsia, GI bleeding, constipation, or diarrhea Genitourinary-Male: nocturia, frequency, hesitancy  Musculoskeletal:  arthritis of the knees, arthritis of the hips Neurological:  denies headaches, stroke, or TIA  ____________________________ PHYSICAL EXAMINATION VITAL SIGNS  Blood Pressure:  134/60 Sitting, Right arm, regular cuff  , 130/62 Standing, Right arm and regular cuff   Pulse:  64/min. Weight:  165.00 lbs. Height:  68"BMI: 25  Constitutional:  pleasant white male in no acute distress Skin:  warm and dry to touch, no apparent skin lesions, or masses noted. Head:  normocephalic, normal hair pattern, no masses or tenderness ENT:  ears, nose and throat reveal no gross abnormalities.  Dentition good.  Neck:   supple, without massess. No JVD, thyromegaly or carotid bruits. Carotid upstroke normal. Chest:  normal symmetry, clear to auscultation. Cardiac:  regular rhythm, normal S1 and S2, no S3 or S4, grade 3/6 systolic murmur heard best at the apex Peripheral Pulses:  the femoral,dorsalis pedis, and posterior tibial pulses are full and equal bilaterally with no bruits auscultated. Extremities & Back:  no deformities, clubbing, cyanosis, erythema or edema observed. Normal muscle strength and tone. Neurological:  no gross motor or sensory deficits noted, affect appropriate, oriented x3. ____________________________ MOST RECENT LIPID PANEL 09/01/13  CHOL TOTL 171 mg/dl, LDL 73 NM, HDL 62 mg/dl, TRIGLYCER 73 mg/dl, ALT 27 u/l, ALK PHOS 73 u/l, CHOL/HDL 2.8 (Calc) and AST 30 u/l ____________________________ IMPRESSIONS/PLAN  1. Mild systolic heart murmur that sounds like a severe mitral regurgitation murmur by examination 2. Paroxysmal atrial fibrillation without recurrence 3. Carotid artery disease asymptomatic 4. Hyperlipidemia  Recommendations:  Obtain echocardiogram to assess loud systolic heart murmur. Followup in one year otherwise but we'll go over echo when he comes. EKG shows an IVCD and left axis.   ____________________________ TODAYS ORDERS  1. 2D, color flow, doppler: First Available  2. Return Visit: 1 year  3. 12 Lead EKG: Today                       ____________________________ Cardiology Physician:  Kerry Hough MD [facility]

## 2015-09-26 DIAGNOSIS — R011 Cardiac murmur, unspecified: Secondary | ICD-10-CM | POA: Diagnosis not present

## 2015-10-03 ENCOUNTER — Encounter: Payer: Self-pay | Admitting: Cardiology

## 2015-10-03 DIAGNOSIS — I340 Nonrheumatic mitral (valve) insufficiency: Secondary | ICD-10-CM

## 2015-10-03 DIAGNOSIS — I779 Disorder of arteries and arterioles, unspecified: Secondary | ICD-10-CM

## 2015-10-03 DIAGNOSIS — I739 Peripheral vascular disease, unspecified: Secondary | ICD-10-CM

## 2015-10-03 DIAGNOSIS — K219 Gastro-esophageal reflux disease without esophagitis: Secondary | ICD-10-CM | POA: Diagnosis not present

## 2015-10-03 DIAGNOSIS — I6529 Occlusion and stenosis of unspecified carotid artery: Secondary | ICD-10-CM | POA: Diagnosis not present

## 2015-10-03 DIAGNOSIS — E785 Hyperlipidemia, unspecified: Secondary | ICD-10-CM | POA: Diagnosis not present

## 2015-10-03 DIAGNOSIS — I480 Paroxysmal atrial fibrillation: Secondary | ICD-10-CM | POA: Diagnosis not present

## 2015-10-07 DIAGNOSIS — I779 Disorder of arteries and arterioles, unspecified: Secondary | ICD-10-CM | POA: Insufficient documentation

## 2015-10-07 DIAGNOSIS — I340 Nonrheumatic mitral (valve) insufficiency: Secondary | ICD-10-CM

## 2015-10-07 DIAGNOSIS — I739 Peripheral vascular disease, unspecified: Secondary | ICD-10-CM

## 2015-10-07 HISTORY — DX: Nonrheumatic mitral (valve) insufficiency: I34.0

## 2015-10-07 NOTE — Progress Notes (Unsigned)
Patient ID: Samuel [name], male   DOB: [birth year], 81 y.o.   MRN: [id number]  [name], [name]  Date of visit:  10/03/2015 DOB:  11-05-1931    Age:  29 yrs. Medical record number:  [id number]     Account number:  [id number] Primary Care Provider: REED,TIFFANY L ____________________________ CURRENT DIAGNOSES  1. Mitral valve regurgitation  2. Paroxysmal atrial fibrillation  3. Gastro-esophageal reflux disease without esophagitis  4. Hyperlipidemia, unspecified  5. Occlusion and stenosis of unspecified carotid artery  6. Personal history of malignant neoplasm of prostate ____________________________ ALLERGIES  No Known Allergies ____________________________ MEDICATIONS  1. nabumetone 750 mg Tablet, BID  2. Uroxatral 10 mg Tablet Extended Release 24 hr, 1 p.o. daily  3. simvastatin 20 mg Tablet, 1 p.o. daily  4. nitroglycerin 0.4 mg tablet, sublingual, SL prn chest pain  5. Vesicare 5 mg tablet, 1 p.o. daily  6. aspirin 81 mg chewable tablet, 1 p.o. daily  7. metoprolol succinate ER 25 mg tablet,extended release 24 hr, 1/2 tab qod ____________________________ CHIEF COMPLAINTS  Followup of Mitral valve regurgitation ____________________________ HISTORY OF PRESENT ILLNESS Patient seen for cardiac followup. He had an Echocardiogram a week ago that showed a partially flail posterior leaflet of the mitral valve with severe mitral regurgitation. The patient [place] that did not show this degree of regurgitation. He reports mild murmur prior to a previous surgery he was evaluated by cardiologist in another city. This was probably about the same time. He is asymptomatic and can work out and do whatever he wants to do. His wife does have dementia which is a problem for him. Otherwise he is a quite healthy 81 year old with not a lot of other comorbidities. There is a note in the chart about paroxysmal atrial fibrillation in the past but we have not been  able to demonstrate any here. ____________________________ PAST HISTORY  Past Medical Illnesses:  syncope, prostate cancer treated with radiation and seed implants, anemia, hyperlipidemia, GERD, BPH;  Cardiovascular Illnesses:  atrial fibrillation;  Surgical Procedures:  tonsillectomy, knee surgery, bil;  NYHA Classification:  I;  Canadian Angina Classification:  Class 0: Asymptomatic;  Cardiology Procedures-Invasive:  no history of prior cardiac procedures;  Cardiology Procedures-Noninvasive:  echocardiogram 2007, treadmill cardiolite 2008, echocardiogram December 2016;  Peripheral Vascular Procedures:  carotid doppler November 2013 (<40% BILAT ica);  LVEF not documented,  CHADS Score:  1,  CHA2DS2-VASC Score:  2 ____________________________ CARDIO-PULMONARY TEST DATES EKG Date:  09/05/2015;  Nuclear Study Date:  04/02/2007;  Echocardiography Date: 09/24/2015;   ____________________________ SOCIAL HISTORY Alcohol Use:  2 1/2 oz rum qd;  Smoking:  used to smoke but quit;  Diet:  regular diet;  Lifestyle:  married, 1 son and 1 daughter;  Exercise:  walking for approximately 30 minutes 7 days per week;  Occupation:  retired and Corporate treasurer;  Residence:  lives with wife;   ____________________________ REVIEW OF SYSTEMS General:  denies recent weight change, fatique or change in exercise tolerance. Eyes: cataract extraction bilaterally Respiratory: denies dyspnea, cough, wheezing or hemoptysis. Cardiovascular:  please review HPI Abdominal: denies dyspepsia, GI bleeding, constipation, or diarrhea Genitourinary-Male: nocturia, frequency, hesitancy  Musculoskeletal:  arthritis of the knees, arthritis of the hips Neurological:  denies headaches, stroke, or TIA  ____________________________ PHYSICAL EXAMINATION VITAL SIGNS  Blood Pressure:  114/70 Sitting, Right arm, regular cuff  , 124/74 Standing, Right arm and regular cuff   Pulse:  64/min. Weight:  166.00 lbs. Height:  68"BMI:  25  Constitutional:  pleasant  white male in no acute distress Skin:  warm and dry to touch, no apparent skin lesions, or masses noted. Head:  normocephalic, normal hair pattern, no masses or tenderness ENT:  ears, nose and throat reveal no gross abnormalities.  Dentition good. Neck:  supple, without massess. No JVD, thyromegaly or carotid bruits. Carotid upstroke normal. Chest:  normal symmetry, clear to auscultation. Cardiac:  regular rhythm, normal S1 and S2, no S3 or S4, grade 3/6 systolic murmur heard best at the apex Peripheral Pulses:  the femoral,dorsalis pedis, and posterior tibial pulses are full and equal bilaterally with no bruits auscultated. Extremities & Back:  no deformities, clubbing, cyanosis, erythema or edema observed. Normal muscle strength and tone. Neurological:  no gross motor or sensory deficits noted, affect appropriate, oriented x3. ____________________________ MOST RECENT LIPID PANEL 09/01/13  CHOL TOTL 171 mg/dl, LDL 73 NM, HDL 62 mg/dl, TRIGLYCER 73 mg/dl, ALT 27 u/l, ALK PHOS 73 u/l, CHOL/HDL 2.8 (Calc) and AST 30 u/l ____________________________ IMPRESSIONS/PLAN  1. Severe mitral regurgitation due to partially flail posterior leaflet of the mitral valve with anteriorly directed mitral regurgitation recent onset 2. Prior carotid artery disease 3. Hyperlipidemia 4. History of paroxysmal atrial fibrillation which is mild with no recurrence or treatment  Recommendations:  Long discussion about mitral valve disease. He has severe mitral regurgitation and a partially flail leaflet. He is 52 and is a vigorous 83. Again discussed the nature of symptoms. Discussed that we normally recommend repair of regurgitation the severity prevent future complications. This may be more problematic in an 81 year old although he is quite vigorous. We also discussed options of waiting and the fact that he become symptomatic that the results may not be as good. Also discussed other treatments including possibility  of percutaneous but usually not an asymptomatic people. He would like to get an opinion about this and would be willing to talk to the surgeon about it. I did not recommend further additional workup unless he would be willing to entertain repair or something else done about the valve. We'll arrange for cardiac thoracic workup and I will see back in 6 weeks. ____________________________ Cleda Clarks  1. CV Surgery Consult Dr. Roxy Manns: Schedule ASAP  2. Return Visit: 6 weeks                       ____________________________ Cardiology Physician:  Kerry Hough MD [facility]

## 2015-10-23 HISTORY — PX: CORONARY ANGIOPLASTY: SHX604

## 2015-10-25 DIAGNOSIS — I10 Essential (primary) hypertension: Secondary | ICD-10-CM | POA: Diagnosis not present

## 2015-10-25 DIAGNOSIS — T50905A Adverse effect of unspecified drugs, medicaments and biological substances, initial encounter: Secondary | ICD-10-CM | POA: Diagnosis not present

## 2015-10-25 DIAGNOSIS — E785 Hyperlipidemia, unspecified: Secondary | ICD-10-CM | POA: Diagnosis not present

## 2015-10-25 LAB — LIPID PANEL
Cholesterol: 164 mg/dL (ref 0–200)
HDL: 67 mg/dL (ref 35–70)
LDL Cholesterol: 69 mg/dL
LDL/HDL RATIO: 1
TRIGLYCERIDES: 75 mg/dL (ref 40–160)

## 2015-10-25 LAB — HEPATIC FUNCTION PANEL
ALT: 20 U/L (ref 10–40)
AST: 23 U/L (ref 14–40)
Alkaline Phosphatase: 66 U/L (ref 25–125)
BILIRUBIN, TOTAL: 0.6 mg/dL

## 2015-10-25 LAB — BASIC METABOLIC PANEL
BUN: 18 mg/dL (ref 4–21)
CREATININE: 1.1 mg/dL (ref 0.6–1.3)
Glucose: 83 mg/dL
Potassium: 4.1 mmol/L (ref 3.4–5.3)
SODIUM: 138 mmol/L (ref 137–147)

## 2015-10-25 LAB — CBC AND DIFFERENTIAL
HCT: 41 % (ref 41–53)
Hemoglobin: 13.9 g/dL (ref 13.5–17.5)
Platelets: 221 10*3/uL (ref 150–399)
WBC: 4.8 10^3/mL

## 2015-10-25 LAB — TSH: TSH: 6.01 u[IU]/mL — AB (ref 0.41–5.90)

## 2015-10-26 ENCOUNTER — Other Ambulatory Visit: Payer: Self-pay

## 2015-10-26 ENCOUNTER — Encounter: Payer: TRICARE For Life (TFL) | Admitting: Thoracic Surgery (Cardiothoracic Vascular Surgery)

## 2015-10-27 ENCOUNTER — Encounter: Payer: Self-pay | Admitting: Thoracic Surgery (Cardiothoracic Vascular Surgery)

## 2015-10-27 ENCOUNTER — Other Ambulatory Visit: Payer: Self-pay | Admitting: Thoracic Surgery (Cardiothoracic Vascular Surgery)

## 2015-10-27 ENCOUNTER — Institutional Professional Consult (permissible substitution) (INDEPENDENT_AMBULATORY_CARE_PROVIDER_SITE_OTHER): Payer: Medicare Other | Admitting: Thoracic Surgery (Cardiothoracic Vascular Surgery)

## 2015-10-27 VITALS — BP 140/80 | HR 60 | Resp 20 | Ht 68.0 in | Wt 163.0 lb

## 2015-10-27 DIAGNOSIS — I340 Nonrheumatic mitral (valve) insufficiency: Secondary | ICD-10-CM | POA: Diagnosis not present

## 2015-10-27 DIAGNOSIS — I719 Aortic aneurysm of unspecified site, without rupture: Secondary | ICD-10-CM

## 2015-10-27 DIAGNOSIS — I7409 Other arterial embolism and thrombosis of abdominal aorta: Secondary | ICD-10-CM

## 2015-10-27 NOTE — Progress Notes (Signed)
[address]       [place] [address]             [phone]     CARDIOTHORACIC SURGERY CONSULTATION REPORT  Referring Provider is [name], MD PCP is [name], DO  Chief Complaint  Patient presents with  . Mitral Regurgitation    Surgical eval, ECHO 09/26/15- Dr Wynonia Lawman    HPI:  Patient is an 82 year old male with history of hypertension and prostate cancer who has otherwise been remarkably healthy most of his life but has been referred for surgical consultation to discuss treatment options for recently discovered mitral valve prolapse with severe mitral regurgitation. The patient is retired from the [employer] and lived for many years in [place] where he was followed by a cardiologist because of history of heart murmur and abnormal electrocardiogram. He states that he has been told that he had a slight heart murmur for most of his life but this did not affect his ability to be in the [employer]. There is no reported history of rheumatic fever. Prior to elective surgery for radioactive seed implantation for management of the patient's prostate cancer the patient was noted to have abnormal EKG and referred for cardiology evaluation. He carries the diagnosis of left atrial enlargement and paroxysmal atrial fibrillation. He states that the irregular heart rhythm was discovered only on a Holter monitor study that was performed after the patient suffered a brief syncopal event several years ago. Approximately 5 years ago the patient moved to [place] where he now lives with his wife at the [place] community.  For the last several years he has been followed by Dr. Wynonia Lawman who recently noted a prominent systolic murmur on physical exam that was noticeably different in comparison with previous exams. Subsequent transthoracic echocardiogram revealed findings consistent with mitral valve prolapse with possible flail segment of the posterior leaflet and  severe mitral regurgitation. The patient was referred for surgical consultation.  The patient remains very active physically and functionally completely independent. He lives in a town house with his wife. His wife has significant short-term memory dysfunction related to dementia but still resides at home with him and remains reasonably functional. The patient exercises regularly, including swimming, aerobic exercise on a treadmill, and weight lifting. He denies any symptoms of exertional shortness of breath or fatigue. He denies any significant problems with PND, orthopnea, or lower extremity edema.  He reports no other significant physical limitations other than some problems related to chronic arthritis and arthralgias in both knees. He specifically denies any history of exertional chest pain or chest tightness. He has never had any palpitations, dizzy spells, nor syncope.     Past Medical History  Diagnosis Date  . Pain in joint, lower leg 02/25/2012  . Unspecified hypothyroidism 08/27/2011  . Palpitations 08/14/2010  . Cortical senile cataract 06/27/2010  . Malignant neoplasm of prostate ([facility]) 06/21/2010  . Other and unspecified hyperlipidemia 06/21/2010  . Unspecified essential hypertension 06/21/2010  . Unspecified constipation 06/21/2010  . Hypertrophy of prostate without urinary obstruction and other lower urinary tract symptoms (LUTS) 06/21/2010  . Osteoarthrosis, unspecified whether generalized or localized, unspecified site 06/21/2010  . Contracture of palmar fascia 06/21/2010  . Enthesopathy of hip region 11/27/2009  . Urinary frequency 08/14/2008  . Nocturia 2011  . Mitral regurgitation 10/07/2015    Severe MR noted on ECHO with partially flail post leaflet Oct 03 2015   . Essential hypertension, benign  06/21/2010  . Carotid artery disease ([facility])   . Hypothyroidism 08/27/2011  . Hyperlipidemia LDL goal <100 06/21/2010  . Aortoiliac occlusive disease [facility])     Past Surgical History    Procedure Laterality Date  . Total knee arthroplasty Right 1955    Dr. Ethel Rana  . Knee arthroscopy with meniscal repair Left 1968  . Cataract extraction w/ intraocular lens  implant, bilateral  2006    Family History  Problem Relation Age of Onset  . Heart disease Mother     myocardial infarction  . Heart disease Father     myocardial infarction    Social History   Social History  . Marital Status: Married    Spouse Name: N/A  . Number of Children: N/A  . Years of Education: N/A   Occupational History  . retired Engineer, materials at [employer]  . Smoking status: Former Smoker -- 5 years    Types: Cigarettes    Quit date: 09/04/1969  . Smokeless tobacco: Never Used  . Alcohol Use: 1.2 oz/week    2 Shots of liquor per week     Comment: 2 daily  . Drug Use: No  . Sexual Activity: Not on file   Other Topics Concern  . Not on file   Social History Narrative   Lives at [facility] since 05/2010   Married [name]    Has living will, POA   Retired from [employer] after 29 years   Stopped smoking 1970   Exercise run 1 1/2 mile every other day, swim 15 minutes every other day, use machines in gym   Alcohol 2 drinks daily    Current Outpatient Prescriptions  Medication Sig Dispense Refill  . alfuzosin (UROXATRAL) 10 MG 24 hr tablet Take 10 mg by mouth daily with breakfast. Take one daily to reduce bladder spasm    . aspirin 81 MG tablet Take 81 mg by mouth. Take one daily for anticoagulation    . levothyroxine (SYNTHROID, LEVOTHROID) 25 MCG tablet TAKE 1 TABLET DAILY IN THE MORNING BEFORE BREAKFAST FOR THYROID 90 tablet 2  . metoprolol succinate (TOPROL-XL) 25 MG 24 hr tablet TAKE ONE-HALF (1/2) TABLET EVERY OTHER DAY 45 tablet 0  . nabumetone (RELAFEN) 750 MG tablet TAKE 1 TABLET TWICE A DAY FOR SWELLING 180 tablet 1  . simvastatin (ZOCOR) 20 MG tablet TAKE 1 TABLET DAILY TO MAINTAIN LOW CHOLESTEROL 90 tablet 0  . solifenacin (VESICARE) 10 MG  tablet Take by mouth daily. Take one daily for bladder control     No current facility-administered medications for this visit.    No Known Allergies    Review of Systems:   General:  normal appetite, normal energy, no weight gain, no weight loss, no fever  Cardiac:  no chest pain with exertion, no chest pain at rest, no SOB with exertion, no resting SOB, no PND, no orthopnea, no palpitations, no arrhythmia, no atrial fibrillation, no LE edema, no dizzy spells, no syncope  Respiratory:  no shortness of breath, no home oxygen, no productive cough, no dry cough, no bronchitis, no wheezing, no hemoptysis, no asthma, no pain with inspiration or cough, no sleep apnea, no CPAP at night  GI:   no difficulty swallowing, no reflux, no frequent heartburn, no hiatal hernia, no abdominal pain, no constipation, no diarrhea, no hematochezia, no hematemesis, no melena  GU:   no dysuria,  + frequency, no urinary tract infection, no hematuria, no enlarged prostate,  no kidney stones, no kidney disease  Vascular:  no pain suggestive of claudication, no pain in feet, no leg cramps, no varicose veins, no DVT, no non-healing foot ulcer  Neuro:   no stroke, no TIA's, no seizures, no headaches, no temporary blindness one eye,  no slurred speech, no peripheral neuropathy, no chronic pain, no instability of gait, no memory/cognitive dysfunction  Musculoskeletal: + arthritis, + joint swelling, no myalgias, no difficulty walking, normal mobility   Skin:   no rash, no itching, no skin infections, no pressure sores or ulcerations  Psych:   no anxiety, no depression, no nervousness, no unusual recent stress  Eyes:   no blurry vision, no floaters, no recent vision changes, + wears glasses or contacts  ENT:   no hearing loss, no loose or painful teeth, no dentures, last saw dentist within the past year  Hematologic:  no easy bruising, no abnormal bleeding, no clotting disorder, no frequent epistaxis  Endocrine:  no diabetes,  does not check CBG's at home     Physical Exam:   BP 140/80 mmHg  Pulse 60  Resp 20  Ht 5\' 8"  (1.727 m)  Wt 163 lb (73.936 kg)  BMI 24.79 kg/m2  SpO2 98%  General:  Thin,  well-appearing, looks younger than stated age  14:  Unremarkable   Neck:   no JVD, no bruits, no adenopathy   Chest:   clear to auscultation, symmetrical breath sounds, no wheezes, no rhonchi   CV:   RRR, prominent grade IV/VI holosystolic murmur   Abdomen:  soft, non-tender, no masses   Extremities:  warm, well-perfused, pulses palpable but somewhat diminished, no LE edema  Rectal/GU  Deferred  Neuro:   Grossly non-focal and symmetrical throughout  Skin:   Clean and dry, no rashes, no breakdown   Diagnostic Tests:  TRANSTHORACIC ECHOCARDIOGRAM  Both images and report of transthoracic echocardiogram performed recently at Dr. Thurman Coyer office are reviewed. Image quality is suboptimal in these digital recordings, but the patient clearly has mitral valve prolapse including a likely flail segment of the posterior leaflet of the mitral valve. There appears to be severe mitral regurgitation with an extremely broad jet of regurgitation that fills the majority of the left atrium. Left ventricular systolic function appears preserved. No other significant abnormalities are noted.   Impression:  Patient has stage C severe asymptomatic primary mitral regurgitation with mitral valve prolapse and what appears to be a flail segment of the posterior leaflet of the mitral valve. Left ventricular systolic function remains preserved. Options include proceeding with further diagnostic workup and possible elective mitral valve repair versus continued close observation on medical therapy. Despite the patient's advanced age he appears to be an excellent candidate for surgery given his otherwise remarkably good health. Because of the severity of mitral regurgitation I favor proceeding with surgery in the near future.  He may be an  excellent candidate for early invasive approach for surgery although diagnostic cardiac catheterization has not yet been performed.   Plan:  The patient was counseled at length regarding the indications, risks and potential benefits of mitral valve repair.  The rationale for elective surgery has been explained, including a comparison between surgery and continued medical therapy with close follow-up.  The patient is interested in proceeding with further diagnostic workup and possible elective surgery in the near future.  We will make arrangements for the patient to undergo transesophageal echocardiogram and diagnostic cardiac catheterization in the near future. The patient will also undergo CT  angiography to further characterize the feasibility of peripheral cannulation for surgery to facilitate a minimally invasive approach. The patient will return for follow-up within the next few weeks once all of these tests have been completed. All of his questions have been addressed.    I spent in excess of 90 minutes during the conduct of this office consultation and >50% of this time involved direct face-to-face encounter with the patient for counseling and/or coordination of their care.   [name], MD 10/27/2015 4:43 PM

## 2015-10-27 NOTE — Patient Instructions (Signed)
Continue all previous medications without any changes at this time  Schedule TEE and heart catheterization as soon as practical through Dr Thurman Coyer office  Schedule CT angiogram

## 2015-10-31 ENCOUNTER — Inpatient Hospital Stay: Admission: RE | Admit: 2015-10-31 | Payer: TRICARE For Life (TFL) | Source: Ambulatory Visit

## 2015-10-31 ENCOUNTER — Other Ambulatory Visit: Payer: TRICARE For Life (TFL)

## 2015-11-01 ENCOUNTER — Encounter: Payer: Self-pay | Admitting: Internal Medicine

## 2015-11-02 ENCOUNTER — Non-Acute Institutional Stay: Payer: Medicare Other | Admitting: Internal Medicine

## 2015-11-02 ENCOUNTER — Encounter: Payer: Self-pay | Admitting: Cardiology

## 2015-11-02 ENCOUNTER — Encounter: Payer: Self-pay | Admitting: Internal Medicine

## 2015-11-02 VITALS — BP 128/66 | HR 64 | Temp 97.5°F | Ht 67.5 in | Wt 163.0 lb

## 2015-11-02 DIAGNOSIS — C61 Malignant neoplasm of prostate: Secondary | ICD-10-CM

## 2015-11-02 DIAGNOSIS — E039 Hypothyroidism, unspecified: Secondary | ICD-10-CM | POA: Diagnosis not present

## 2015-11-02 DIAGNOSIS — I10 Essential (primary) hypertension: Secondary | ICD-10-CM | POA: Diagnosis not present

## 2015-11-02 DIAGNOSIS — I480 Paroxysmal atrial fibrillation: Secondary | ICD-10-CM | POA: Diagnosis not present

## 2015-11-02 DIAGNOSIS — E785 Hyperlipidemia, unspecified: Secondary | ICD-10-CM

## 2015-11-02 DIAGNOSIS — I340 Nonrheumatic mitral (valve) insufficiency: Secondary | ICD-10-CM

## 2015-11-02 DIAGNOSIS — Z0000 Encounter for general adult medical examination without abnormal findings: Secondary | ICD-10-CM | POA: Diagnosis not present

## 2015-11-02 DIAGNOSIS — Z01810 Encounter for preprocedural cardiovascular examination: Secondary | ICD-10-CM | POA: Diagnosis not present

## 2015-11-02 DIAGNOSIS — I6529 Occlusion and stenosis of unspecified carotid artery: Secondary | ICD-10-CM | POA: Diagnosis not present

## 2015-11-02 DIAGNOSIS — K219 Gastro-esophageal reflux disease without esophagitis: Secondary | ICD-10-CM | POA: Diagnosis not present

## 2015-11-02 MED ORDER — LEVOTHYROXINE SODIUM 50 MCG PO CAPS: 1.0000 | ORAL_CAPSULE | Freq: Every day | ORAL | Status: DC

## 2015-11-02 NOTE — Progress Notes (Signed)
Patient ID: Samuel [name], male   DOB: Mar 12, 1932, 81 y.o.   MRN: [id number] MMSE 28/30 passed clock

## 2015-11-02 NOTE — Progress Notes (Signed)
Patient ID: Samuel [name], male   DOB: 1932/09/07, 82 y.o.   MRN: [id number]  [name], [name]  Date of visit:  11/02/2015 DOB:  [birth year]    Age:  82 yrs. Medical record number:  [id number]     Account number:  [id number] Primary Care Provider: REED,TIFFANY L ____________________________ CURRENT DIAGNOSES  1. Mitral valve regurgitation  2. Paroxysmal atrial fibrillation  3. Gastro-esophageal reflux disease without esophagitis  4. Hyperlipidemia, unspecified  5. Occlusion and stenosis of unspecified carotid artery  6. Personal history of malignant neoplasm of prostate ____________________________ ALLERGIES  No Known Allergies ____________________________ MEDICATIONS  1. nabumetone 750 mg Tablet, BID  2. Uroxatral 10 mg Tablet Extended Release 24 hr, 1 p.o. daily  3. simvastatin 20 mg Tablet, 1 p.o. daily  4. nitroglycerin 0.4 mg tablet, sublingual, SL prn chest pain  5. Vesicare 5 mg tablet, 1 p.o. daily  6. aspirin 81 mg chewable tablet, 1 p.o. daily  7. metoprolol succinate ER 25 mg tablet,extended release 24 hr, 1/2 tab qod ____________________________ CHIEF COMPLAINTS  Talk re cath ____________________________ HISTORY OF PRESENT ILLNESS Patient seen for cardiac followup. He was found to have severe mitral regurgitation and a partially flail posterior leaflet of the mitral valve. He has seen Dr. Roxy Manns and is now agreeable to having a workup to be considered for repair. The patient is quite vigorous at his age and he denies angina or shortness of breath. He has no PND orthopnea or edema. He has a history of some carotid artery disease that is asymptomatic but is normally able to exercise. His wife does have some health issues that are of concern to him so his schedule will need to be taken into account when his diagnostic testing as scheduled. ____________________________ PAST HISTORY  Past Medical Illnesses:  syncope, prostate cancer treated with radiation and seed implants, anemia,  hyperlipidemia, GERD, BPH;  Cardiovascular Illnesses:  atrial fibrillation, mitral regurgitation;  Surgical Procedures:  tonsillectomy, knee surgery, bil;  NYHA Classification:  I;  Canadian Angina Classification:  Class 0: Asymptomatic;  Cardiology Procedures-Invasive:  no history of prior cardiac procedures;  Cardiology Procedures-Noninvasive:  echocardiogram 2007, treadmill cardiolite 2008, echocardiogram December 2016;  Peripheral Vascular Procedures:  carotid doppler November 2013 (<40% BILAT ica);  LVEF of 60% documented via echocardiogram on 09/26/2015,  CHADS Score:  1,  CHA2DS2-VASC Score:  2 ____________________________ CARDIO-PULMONARY TEST DATES EKG Date:  09/05/2015;  Nuclear Study Date:  04/02/2007;  Echocardiography Date: 09/24/2015;   ____________________________ FAMILY HISTORY Father -- Father dead Mother -- Mother dead Sister -- Sister alive and well ____________________________ SOCIAL HISTORY Alcohol Use:  2 1/2 oz rum qd;  Smoking:  used to smoke but quit;  Diet:  regular diet;  Lifestyle:  married, 1 son and 1 daughter;  Exercise:  walking for approximately 30 minutes 7 days per week;  Occupation:  retired and Corporate treasurer;  Residence:  lives with wife;   ____________________________ REVIEW OF SYSTEMS General:  denies recent weight change, fatique or change in exercise tolerance.  Integumentary:no rashes or new skin lesions. Eyes: cataract extraction bilaterally Respiratory: denies dyspnea, cough, wheezing or hemoptysis. Cardiovascular:  please review HPI Abdominal: denies dyspepsia, GI bleeding, constipation, or diarrhea Genitourinary-Male: nocturia, frequency, hesitancy  Musculoskeletal:  arthritis of the knees, arthritis of the hips Neurological:  denies headaches, stroke, or TIA  ____________________________ PHYSICAL EXAMINATION VITAL SIGNS  Blood Pressure:  124/70 Sitting, Right arm, regular cuff  , 120/72 Standing, Right arm and regular cuff   Pulse:  72/min. Weight:  164.00  lbs. Height:  68"BMI: 25  Constitutional:  pleasant white male in no acute distress Skin:  warm and dry to touch, no apparent skin lesions, or masses noted. Head:  normocephalic, normal hair pattern, no masses or tenderness ENT:  ears, nose and throat reveal no gross abnormalities.  Dentition good. Neck:  supple, without massess. No JVD, thyromegaly or carotid bruits. Carotid upstroke normal. Chest:  normal symmetry, clear to auscultation. Cardiac:  regular rhythm, normal S1 and S2, no S3 or S4, grade 3/6 systolic murmur heard best at the apex Peripheral Pulses:  the femoral,dorsalis pedis, and posterior tibial pulses are full and equal bilaterally with no bruits auscultated. Extremities & Back:  no deformities, clubbing, cyanosis, erythema or edema observed. Normal muscle strength and tone. Neurological:  no gross motor or sensory deficits noted, affect appropriate, oriented x3. ____________________________ MOST RECENT LIPID PANEL 10/25/15  CHOL TOTL 164 mg/dl, LDL 69 NM, HDL 67 mg/dl and TRIGLYCER 75 mg/dl ____________________________ IMPRESSIONS/PLAN  1. Severe mitral regurgitation due partially flail posterior leaflet and her this is an is an 8 he is to rule and unless instructions and upper males is 2. History of carotid artery disease asymptomatic 3. History of prostate cancer 4. Hyperlipidemia 5. Remote history of paroxysmal atrial fibrillation years ago without recurrence  Recommendations:  He will need to have a transesophageal echocardiogram as well as cardiac catheterization. Cardiac catheterization was discussed with the patient including risks of myocardial infarction, death, stroke, bleeding, arrhythmia, dye allergy, or renal insufficiency. He understands and is willing to proceed. Possibility of percutaneous intervention at the same setting was also discussed with the patient including risks. He would like to proceed with these and we discussed the risks and benefits of  both with him. He would like to have these both done on the same day  if possible. ____________________________ TODAYS ORDERS  1. TEE: First Available  2. Right and Left Heart Cath: First Available                       ____________________________ Cardiology Physician:  Kerry Hough MD [facility]

## 2015-11-02 NOTE — Progress Notes (Signed)
Patient ID: Samuel [name], male   DOB: 02-Mar-1932, 82 y.o.   MRN: [id number]   Location: [facility] Provider: Maheen Cwikla L. Mariea Clonts, D.O., C.M.D.  Code Status: DNR Goals of Care: Advanced Directive information Does patient have an advance directive?: Yes, Type of Advance Directive: Living will  Chief Complaint  Patient presents with  . Annual Exam    Wellness exam  . Medical Management of Chronic Issues    blood pressure, thyroid, mitral valve prolapse    HPI: Patient is a 82 y.o. male seen in the office today for an annual wellness exam.    Has TEE, catheterization, and CT of heart.  CT is coming up and Dr. Wynonia Lawman is scheduling the other two.  Going forward like an emergency, he reports.  Dr. Ricard Dillon also encouraged him to get it done while his health remains good. Probably in February.  Plan in 4-5 days of hospitalization.  Every now and then he gets a light pain in the left chest. Just feels it.  Still swimming and doing his regular routine every other day.  No dyspnea.  No syncope.  HTN:  bp at goal.    Depression screen [facility] 2/9 11/02/2015 10/26/2014  Decreased Interest 0 0  Down, Depressed, Hopeless 0 0  PHQ - 2 Score 0 0    Fall Risk  11/02/2015 10/26/2014  Falls in the past year? No No   MMSE - Mini Mental State Exam 11/02/2015  Orientation to time 5  Orientation to Place 5  Registration 3  Attention/ Calculation 5  Recall 1  Language- name 2 objects 2  Language- repeat 1  Language- follow 3 step command 3  Language- read & follow direction 1  Write a sentence 1  Copy design 1  Total score 28  Passed clock.  Cannot remember peoples' names.  Says he is losing his memory slowly but surely.     Health Maintenance  Topic Date Due  . PNA vac Low Risk Adult (2 of 2 - PPSV23) 10/27/2015  . INFLUENZA VACCINE  05/22/2016  . TETANUS/TDAP  02/27/2025  . ZOSTAVAX  Completed   Urinary incontinence: No difficulty.  Gets up 2-3 x per night.   Functional status:  Independent  in all ADLs, IADLs; still very active in [facility] Exercise:  Swims and does exercise routine every other day Diet:  No special diet.  Doesn't eat a lot of lunch so he can eat more liberally at supper. Vision:  Seeing well. OD 20/40, OS 20/30, and 20/30 OU Hearing:  No difficulty here.  Dentition:  No dental problems.  Goes regularly--just had a cleaning. Pain:  No pain whatsoever.    Review of Systems:  Review of Systems  Constitutional: Negative for fever and chills.  HENT: Negative for hearing loss.   Eyes: Negative for blurred vision.  Respiratory: Negative for cough and shortness of breath.   Cardiovascular: Positive for chest pain. Negative for palpitations, orthopnea and leg swelling.  Gastrointestinal: Negative for abdominal pain, constipation, blood in stool and melena.  Genitourinary: Positive for frequency. Negative for dysuria and urgency.  Musculoskeletal: Negative for falls.  Neurological: Negative for dizziness and loss of consciousness.  Endo/Heme/Allergies: Bruises/bleeds easily.  Psychiatric/Behavioral: Positive for memory loss. The patient does not have insomnia.        He notes, but not significant during visit    Past Medical History  Diagnosis Date  . Pain in joint, lower leg 02/25/2012  . Unspecified hypothyroidism 08/27/2011  . Palpitations 08/14/2010  .  Cortical senile cataract 06/27/2010  . Malignant neoplasm of prostate ([place]) 06/21/2010  . Other and unspecified hyperlipidemia 06/21/2010  . Unspecified essential hypertension 06/21/2010  . Unspecified constipation 06/21/2010  . Hypertrophy of prostate without urinary obstruction and other lower urinary tract symptoms (LUTS) 06/21/2010  . Osteoarthrosis, unspecified whether generalized or localized, unspecified site 06/21/2010  . Contracture of palmar fascia 06/21/2010  . Enthesopathy of hip region 11/27/2009  . Urinary frequency 08/14/2008  . Nocturia 2011  . Mitral regurgitation 10/07/2015    Severe MR noted on  ECHO with partially flail post leaflet Oct 03 2015   . Essential hypertension, benign 06/21/2010  . Carotid artery disease ([place])   . Hypothyroidism 08/27/2011  . Hyperlipidemia LDL goal <100 06/21/2010  . Aortoiliac occlusive disease [facility])     Past Surgical History  Procedure Laterality Date  . Total knee arthroplasty Right 1955    Dr. Ethel Rana  . Knee arthroscopy with meniscal repair Left 1968  . Cataract extraction w/ intraocular lens  implant, bilateral  2006     No Known Allergies    Medication List       This list is accurate as of: 11/02/15  3:02 PM.  Always use your most recent med list.               alfuzosin 10 MG 24 hr tablet  Commonly known as:  UROXATRAL  Take 10 mg by mouth daily with breakfast. Take one daily to reduce bladder spasm     aspirin 81 MG tablet  Take 81 mg by mouth. Take one daily for anticoagulation     levothyroxine 25 MCG tablet  Commonly known as:  SYNTHROID, LEVOTHROID  TAKE 1 TABLET DAILY IN THE MORNING BEFORE BREAKFAST FOR THYROID     metoprolol succinate 25 MG 24 hr tablet  Commonly known as:  TOPROL-XL  TAKE ONE-HALF (1/2) TABLET EVERY OTHER DAY     nabumetone 750 MG tablet  Commonly known as:  RELAFEN  TAKE 1 TABLET TWICE A DAY FOR SWELLING     simvastatin 20 MG tablet  Commonly known as:  ZOCOR  TAKE 1 TABLET DAILY TO MAINTAIN LOW CHOLESTEROL     solifenacin 10 MG tablet  Commonly known as:  VESICARE  Take by mouth daily. Take one daily for bladder control        Physical Exam: Filed Vitals:   11/02/15 1446  BP: 128/66  Pulse: 64  Temp: 97.5 F (36.4 C)  TempSrc: Oral  Height: 5' 7.5" (1.715 m)  Weight: 163 lb (73.936 kg)  SpO2: 92%   Body mass index is 25.14 kg/(m^2). Physical Exam  Constitutional: He is oriented to person, place, and time. He appears well-developed and well-nourished. No distress.  HENT:  Head: Normocephalic and atraumatic.  Right Ear: External ear normal.  Left Ear: External ear normal.    Nose: Nose normal.  Mouth/Throat: Oropharynx is clear and moist. No oropharyngeal exudate.  Eyes: Conjunctivae and EOM are normal. Pupils are equal, round, and reactive to light.  Neck: Normal range of motion. Neck supple. No JVD present.  Cardiovascular: Normal rate and regular rhythm.   Murmur heard. 3/6 systolic murmur loudest at apex midaxillary line but audible throughout the precordium  Pulmonary/Chest: Effort normal and breath sounds normal. No respiratory distress. He has no rales.  Abdominal: Soft. Bowel sounds are normal. He exhibits no distension and no mass. There is no tenderness.  Musculoskeletal: Normal range of motion. He exhibits no edema or tenderness.  Lymphadenopathy:    He has no cervical adenopathy.  Neurological: He is alert and oriented to person, place, and time. He has normal reflexes.  Skin: Skin is warm and dry.  Psychiatric: He has a normal mood and affect.    Labs reviewed: Basic Metabolic Panel:  Recent Labs  10/25/15  NA 138  K 4.1  BUN 18  CREATININE 1.1  TSH 6.01*   Liver Function Tests:  Recent Labs  10/25/15  AST 23  ALT 20  ALKPHOS 66   No results for input(s): LIPASE, AMYLASE in the last 8760 hours. No results for input(s): AMMONIA in the last 8760 hours. CBC:  Recent Labs  10/25/15  WBC 4.8  HGB 13.9  HCT 41  PLT 221   Lipid Panel:  Recent Labs  10/25/15  CHOL 164  HDL 67  LDLCALC 69  TRIG 75  Reviewed labs with pt  Procedures: 09/26/15 echocardiogram reviewed and pt provided copy of CD for me to personally view (current laptop w/o cd drive):  Severe mitral regurg with ruptured chordae tendinae noted as new finding  Assessment/Plan 1. Essential hypertension, benign -bp well controlled on current regimen -pt does report occasional "light pain" in left chest, but not severe and not enough to treat, resolves on own; no other symptoms of his new flail valve  2. Mitral regurgitation -is undergoing workup as in hpi  for valve surgery--to get imaging, cath, and TEE to assess if needs cabg as well during procedure -will follow results as they transpire and will also plan to see him in rehab here at [facility] upon hospital discharge  3. Hypothyroidism, unspecified hypothyroidism type -TSH elevated at 6 this time -will increase synthroid to 79mcg daily from 37mcg and f/u tsh during rehab stay  4. Hyperlipidemia LDL goal <100 -lipids at goal with zocor, cont same  5. Malignant neoplasm of prostate ([place]) -cont uroxatral, vesicare and monitor  6. Medicare annual wellness visit, subsequent -see above -needs a new pneumovax ideally before his surgery -has had prevnar over a year ago and last pneumovax at least 10 years ago -otherwise up to date on age appropriate preventive care  Labs/tests ordered:  No new Next appt:  Will see him in rehab postop  Shanin Szymanowski L. Teira Arcilla, D.O. [facility] [address], [place] [address] Cell Phone (Mon-Fri 8am-5pm):  [phone] On Call:  [phone] & follow prompts after 5pm & weekends Office Phone:  [phone] Office Fax:  [phone]

## 2015-11-04 ENCOUNTER — Ambulatory Visit
Admission: RE | Admit: 2015-11-04 | Discharge: 2015-11-04 | Disposition: A | Discharge status: Home / Self Care | Payer: Medicare Other | Source: Ambulatory Visit | Attending: Thoracic Surgery (Cardiothoracic Vascular Surgery) | Admitting: Thoracic Surgery (Cardiothoracic Vascular Surgery)

## 2015-11-04 DIAGNOSIS — I719 Aortic aneurysm of unspecified site, without rupture: Secondary | ICD-10-CM

## 2015-11-04 DIAGNOSIS — Z01818 Encounter for other preprocedural examination: Secondary | ICD-10-CM | POA: Diagnosis not present

## 2015-11-04 DIAGNOSIS — I700 Atherosclerosis of aorta: Secondary | ICD-10-CM | POA: Diagnosis not present

## 2015-11-04 DIAGNOSIS — I7409 Other arterial embolism and thrombosis of abdominal aorta: Secondary | ICD-10-CM

## 2015-11-04 MED ORDER — IOPAMIDOL (ISOVUE-370) INJECTION 76%
75.0000 mL | Freq: Once | INTRAVENOUS | Status: AC | PRN
  Administered 2015-11-04: 75 mL via INTRAVENOUS

## 2015-11-10 ENCOUNTER — Ambulatory Visit (HOSPITAL_COMMUNITY)
Admission: AD | Admit: 2015-11-10 | Discharge: 2015-11-10 | Disposition: A | Discharge status: Home / Self Care | Payer: Medicare Other | Source: Ambulatory Visit | Attending: Cardiovascular Disease | Admitting: Cardiovascular Disease

## 2015-11-10 ENCOUNTER — Ambulatory Visit (HOSPITAL_BASED_OUTPATIENT_CLINIC_OR_DEPARTMENT_OTHER)
Admission: AD | Admit: 2015-11-10 | Discharge: 2015-11-10 | Disposition: A | Discharge status: Home / Self Care | Payer: Medicare Other | Source: Ambulatory Visit | Attending: Cardiovascular Disease | Admitting: Cardiovascular Disease

## 2015-11-10 ENCOUNTER — Encounter (HOSPITAL_COMMUNITY)
Admission: AD | Disposition: A | Discharge status: Home / Self Care | Payer: Self-pay | Source: Ambulatory Visit | Attending: Cardiovascular Disease

## 2015-11-10 ENCOUNTER — Encounter (HOSPITAL_COMMUNITY): Payer: Self-pay | Admitting: *Deleted

## 2015-11-10 DIAGNOSIS — I480 Paroxysmal atrial fibrillation: Secondary | ICD-10-CM | POA: Insufficient documentation

## 2015-11-10 DIAGNOSIS — I340 Nonrheumatic mitral (valve) insufficiency: Secondary | ICD-10-CM | POA: Diagnosis not present

## 2015-11-10 DIAGNOSIS — E785 Hyperlipidemia, unspecified: Secondary | ICD-10-CM | POA: Diagnosis not present

## 2015-11-10 DIAGNOSIS — Z7982 Long term (current) use of aspirin: Secondary | ICD-10-CM | POA: Insufficient documentation

## 2015-11-10 DIAGNOSIS — K219 Gastro-esophageal reflux disease without esophagitis: Secondary | ICD-10-CM | POA: Diagnosis not present

## 2015-11-10 DIAGNOSIS — I341 Nonrheumatic mitral (valve) prolapse: Secondary | ICD-10-CM | POA: Insufficient documentation

## 2015-11-10 DIAGNOSIS — Z8546 Personal history of malignant neoplasm of prostate: Secondary | ICD-10-CM | POA: Insufficient documentation

## 2015-11-10 DIAGNOSIS — I10 Essential (primary) hypertension: Secondary | ICD-10-CM | POA: Diagnosis not present

## 2015-11-10 DIAGNOSIS — I2510 Atherosclerotic heart disease of native coronary artery without angina pectoris: Secondary | ICD-10-CM | POA: Insufficient documentation

## 2015-11-10 DIAGNOSIS — Z87891 Personal history of nicotine dependence: Secondary | ICD-10-CM | POA: Insufficient documentation

## 2015-11-10 DIAGNOSIS — D649 Anemia, unspecified: Secondary | ICD-10-CM | POA: Diagnosis not present

## 2015-11-10 DIAGNOSIS — I739 Peripheral vascular disease, unspecified: Secondary | ICD-10-CM

## 2015-11-10 DIAGNOSIS — I779 Disorder of arteries and arterioles, unspecified: Secondary | ICD-10-CM | POA: Diagnosis present

## 2015-11-10 HISTORY — PX: TEE WITHOUT CARDIOVERSION: SHX5443

## 2015-11-10 HISTORY — PX: CARDIAC CATHETERIZATION: SHX172

## 2015-11-10 LAB — CBC
HEMATOCRIT: 41.4 % (ref 39.0–52.0)
Hemoglobin: 14.1 g/dL (ref 13.0–17.0)
MCH: 30.9 pg (ref 26.0–34.0)
MCHC: 34.1 g/dL (ref 30.0–36.0)
MCV: 90.6 fL (ref 78.0–100.0)
Platelets: 178 10*3/uL (ref 150–400)
RBC: 4.57 MIL/uL (ref 4.22–5.81)
RDW: 13.2 % (ref 11.5–15.5)
WBC: 3.6 10*3/uL — AB (ref 4.0–10.5)

## 2015-11-10 LAB — POCT I-STAT 3, ART BLOOD GAS (G3+)
Acid-Base Excess: 1 mmol/L (ref 0.0–2.0)
Bicarbonate: 24.6 mEq/L — ABNORMAL HIGH (ref 20.0–24.0)
O2 SAT: 96 %
PCO2 ART: 36.6 mmHg (ref 35.0–45.0)
PO2 ART: 82 mmHg (ref 80.0–100.0)
TCO2: 26 mmol/L (ref 0–100)
pH, Arterial: 7.435 (ref 7.350–7.450)

## 2015-11-10 LAB — POCT I-STAT 3, VENOUS BLOOD GAS (G3P V)
ACID-BASE DEFICIT: 2 mmol/L (ref 0.0–2.0)
BICARBONATE: 23.3 meq/L (ref 20.0–24.0)
O2 Saturation: 75 %
PCO2 VEN: 40.5 mmHg — AB (ref 45.0–50.0)
PO2 VEN: 41 mmHg (ref 30.0–45.0)
TCO2: 25 mmol/L (ref 0–100)
pH, Ven: 7.369 — ABNORMAL HIGH (ref 7.250–7.300)

## 2015-11-10 LAB — BASIC METABOLIC PANEL
ANION GAP: 9 (ref 5–15)
BUN: 13 mg/dL (ref 6–20)
CALCIUM: 9.2 mg/dL (ref 8.9–10.3)
CO2: 25 mmol/L (ref 22–32)
CREATININE: 1.15 mg/dL (ref 0.61–1.24)
Chloride: 106 mmol/L (ref 101–111)
GFR calc Af Amer: >60 mL/min (ref >60)
GFR calc non Af Amer: 58 mL/min — ABNORMAL LOW (ref >60)
GLUCOSE: 89 mg/dL (ref 65–99)
Potassium: 4.1 mmol/L (ref 3.5–5.1)
Sodium: 140 mmol/L (ref 135–145)

## 2015-11-10 LAB — PROTIME-INR
INR: 1.1 (ref 0.00–1.49)
Prothrombin Time: 14.4 seconds (ref 11.6–15.2)

## 2015-11-10 LAB — POCT ACTIVATED CLOTTING TIME: ACTIVATED CLOTTING TIME: 178 s

## 2015-11-10 SURGERY — RIGHT/LEFT HEART CATH AND CORONARY ANGIOGRAPHY
Anesthesia: LOCAL

## 2015-11-10 SURGERY — ECHOCARDIOGRAM, TRANSESOPHAGEAL
Anesthesia: Moderate Sedation

## 2015-11-10 MED ORDER — LIDOCAINE HCL (PF) 1 % IJ SOLN
INTRAMUSCULAR | Status: AC
  Filled 2015-11-10: qty 30

## 2015-11-10 MED ORDER — SODIUM CHLORIDE 0.9 % WEIGHT BASED INFUSION: 3.0000 mL/kg/h | INTRAVENOUS | Status: AC

## 2015-11-10 MED ORDER — SODIUM CHLORIDE 0.9 % WEIGHT BASED INFUSION: 1.0000 mL/kg/h | INTRAVENOUS | Status: DC

## 2015-11-10 MED ORDER — MIDAZOLAM HCL 5 MG/ML IJ SOLN
INTRAMUSCULAR | Status: AC
  Filled 2015-11-10: qty 2

## 2015-11-10 MED ORDER — SODIUM CHLORIDE 0.9 % IV SOLN: 250.0000 mL | INTRAVENOUS | Status: DC | PRN

## 2015-11-10 MED ORDER — HEPARIN (PORCINE) IN NACL 2-0.9 UNIT/ML-% IJ SOLN
INTRAMUSCULAR | Status: DC | PRN
  Administered 2015-11-10: 12:00:00

## 2015-11-10 MED ORDER — IOHEXOL 350 MG/ML SOLN
INTRAVENOUS | Status: DC | PRN
Start: 2015-11-10 — End: 2015-11-10
  Administered 2015-11-10: 60 mL via INTRAVENOUS

## 2015-11-10 MED ORDER — SODIUM CHLORIDE 0.9 % IJ SOLN: 3.0000 mL | Freq: Two times a day (BID) | INTRAMUSCULAR | Status: DC

## 2015-11-10 MED ORDER — FENTANYL CITRATE (PF) 100 MCG/2ML IJ SOLN
INTRAMUSCULAR | Status: AC
  Filled 2015-11-10: qty 2

## 2015-11-10 MED ORDER — SODIUM CHLORIDE 0.9 % IJ SOLN: 3.0000 mL | INTRAMUSCULAR | Status: DC | PRN

## 2015-11-10 MED ORDER — ASPIRIN 81 MG PO CHEW
81.0000 mg | CHEWABLE_TABLET | ORAL | Status: DC
  Filled 2015-11-10: qty 1

## 2015-11-10 MED ORDER — HEPARIN SODIUM (PORCINE) 1000 UNIT/ML IJ SOLN
INTRAMUSCULAR | Status: DC | PRN
  Administered 2015-11-10: 3000 [IU] via INTRAVENOUS

## 2015-11-10 MED ORDER — VERAPAMIL HCL 2.5 MG/ML IV SOLN
INTRAVENOUS | Status: DC | PRN
  Administered 2015-11-10: 10 mL via INTRA_ARTERIAL

## 2015-11-10 MED ORDER — MIDAZOLAM HCL 10 MG/2ML IJ SOLN
INTRAMUSCULAR | Status: DC | PRN
  Administered 2015-11-10 (×5): 1 mg via INTRAVENOUS

## 2015-11-10 MED ORDER — SODIUM CHLORIDE 0.9 % IV SOLN
250.0000 mL | INTRAVENOUS | Status: DC | PRN
  Administered 2015-11-10: 250 mL via INTRAVENOUS

## 2015-11-10 MED ORDER — HEPARIN SODIUM (PORCINE) 1000 UNIT/ML IJ SOLN
INTRAMUSCULAR | Status: AC
  Filled 2015-11-10: qty 1

## 2015-11-10 MED ORDER — HEPARIN (PORCINE) IN NACL 2-0.9 UNIT/ML-% IJ SOLN
INTRAMUSCULAR | Status: AC
  Filled 2015-11-10: qty 1000

## 2015-11-10 MED ORDER — FENTANYL CITRATE (PF) 100 MCG/2ML IJ SOLN
INTRAMUSCULAR | Status: DC | PRN
  Administered 2015-11-10 (×2): 12.5 ug via INTRAVENOUS
  Administered 2015-11-10 (×2): 25 ug via INTRAVENOUS

## 2015-11-10 MED ORDER — VERAPAMIL HCL 2.5 MG/ML IV SOLN
INTRAVENOUS | Status: AC
  Filled 2015-11-10: qty 2

## 2015-11-10 MED ORDER — MIDAZOLAM HCL 2 MG/2ML IJ SOLN
INTRAMUSCULAR | Status: DC | PRN
  Administered 2015-11-10: 1 mg via INTRAVENOUS

## 2015-11-10 MED ORDER — BUTAMBEN-TETRACAINE-BENZOCAINE 2-2-14 % EX AERO
INHALATION_SPRAY | CUTANEOUS | Status: DC | PRN
  Administered 2015-11-10: 2 via TOPICAL

## 2015-11-10 MED ORDER — MIDAZOLAM HCL 2 MG/2ML IJ SOLN
INTRAMUSCULAR | Status: AC
  Filled 2015-11-10: qty 2

## 2015-11-10 SURGICAL SUPPLY — 17 items
CATH BALLN WEDGE 5F 110CM (CATHETERS) ×2 IMPLANT
CATH INFINITI 5 FR JL3.5 (CATHETERS) ×2 IMPLANT
CATH INFINITI 5FR ANG PIGTAIL (CATHETERS) ×2 IMPLANT
CATH INFINITI JR4 5F (CATHETERS) ×2 IMPLANT
CATH SWAN GANZ 7F STRAIGHT (CATHETERS) ×2 IMPLANT
DEVICE RAD COMP TR BAND LRG (VASCULAR PRODUCTS) ×2 IMPLANT
GLIDESHEATH SLEND SS 6F .021 (SHEATH) ×2 IMPLANT
GUIDEWIRE .025 260CM (WIRE) ×2 IMPLANT
KIT HEART LEFT (KITS) ×2 IMPLANT
KIT HEART RIGHT NAMIC (KITS) ×2 IMPLANT
PACK CARDIAC CATHETERIZATION (CUSTOM PROCEDURE TRAY) ×2 IMPLANT
SHEATH FAST CATH BRACH 5F 5CM (SHEATH) ×2 IMPLANT
SHEATH PINNACLE 7F 10CM (SHEATH) ×2 IMPLANT
SYR MEDRAD MARK V 150ML (SYRINGE) ×2 IMPLANT
TRANSDUCER W/STOPCOCK (MISCELLANEOUS) ×2 IMPLANT
TUBING CIL FLEX 10 FLL-RA (TUBING) ×2 IMPLANT
WIRE SAFE-T 1.5MM-J .035X260CM (WIRE) ×2 IMPLANT

## 2015-11-10 NOTE — H&P (View-Only) (Signed)
Patient ID: Samuel [name], male   DOB: 30-Oct-1931, 82 y.o.   MRN: [id number]  Samuel, [name]  Date of visit:  11/02/2015 DOB:  1932-01-24    Age:  82 yrs. Medical record number:  [id number]     Account number:  [id number] Primary Care Provider: REED,TIFFANY L ____________________________ CURRENT DIAGNOSES  1. Mitral valve regurgitation  2. Paroxysmal atrial fibrillation  3. Gastro-esophageal reflux disease without esophagitis  4. Hyperlipidemia, unspecified  5. Occlusion and stenosis of unspecified carotid artery  6. Personal history of malignant neoplasm of prostate ____________________________ ALLERGIES  No Known Allergies ____________________________ MEDICATIONS  1. nabumetone 750 mg Tablet, BID  2. Uroxatral 10 mg Tablet Extended Release 24 hr, 1 p.o. daily  3. simvastatin 20 mg Tablet, 1 p.o. daily  4. nitroglycerin 0.4 mg tablet, sublingual, SL prn chest pain  5. Vesicare 5 mg tablet, 1 p.o. daily  6. aspirin 81 mg chewable tablet, 1 p.o. daily  7. metoprolol succinate ER 25 mg tablet,extended release 24 hr, 1/2 tab qod ____________________________ CHIEF COMPLAINTS  Talk re cath ____________________________ HISTORY OF PRESENT ILLNESS Patient seen for cardiac followup. He was found to have severe mitral regurgitation and a partially flail posterior leaflet of the mitral valve. He has seen Dr. Roxy Manns and is now agreeable to having a workup to be considered for repair. The patient is quite vigorous at his age and he denies angina or shortness of breath. He has no PND orthopnea or edema. He has a history of some carotid artery disease that is asymptomatic but is normally able to exercise. His wife does have some health issues that are of concern to him so his schedule will need to be taken into account when his diagnostic testing as scheduled. ____________________________ PAST HISTORY  Past Medical Illnesses:  syncope, prostate cancer treated with radiation and seed implants, anemia,  hyperlipidemia, GERD, BPH;  Cardiovascular Illnesses:  atrial fibrillation, mitral regurgitation;  Surgical Procedures:  tonsillectomy, knee surgery, bil;  NYHA Classification:  I;  Canadian Angina Classification:  Class 0: Asymptomatic;  Cardiology Procedures-Invasive:  no history of prior cardiac procedures;  Cardiology Procedures-Noninvasive:  echocardiogram 2007, treadmill cardiolite 2008, echocardiogram December 2016;  Peripheral Vascular Procedures:  carotid doppler November 2013 (<40% BILAT ica);  LVEF of 60% documented via echocardiogram on 09/26/2015,  CHADS Score:  1,  CHA2DS2-VASC Score:  2 ____________________________ CARDIO-PULMONARY TEST DATES EKG Date:  09/05/2015;  Nuclear Study Date:  04/02/2007;  Echocardiography Date: 09/24/2015;   ____________________________ FAMILY HISTORY Father -- Father dead Mother -- Mother dead Sister -- Sister alive and well ____________________________ SOCIAL HISTORY Alcohol Use:  2 1/2 oz rum qd;  Smoking:  used to smoke but quit;  Diet:  regular diet;  Lifestyle:  married, 1 son and 1 daughter;  Exercise:  walking for approximately 30 minutes 7 days per week;  Occupation:  retired and Corporate treasurer;  Residence:  lives with wife;   ____________________________ REVIEW OF SYSTEMS General:  denies recent weight change, fatique or change in exercise tolerance.  Integumentary:no rashes or new skin lesions. Eyes: cataract extraction bilaterally Respiratory: denies dyspnea, cough, wheezing or hemoptysis. Cardiovascular:  please review HPI Abdominal: denies dyspepsia, GI bleeding, constipation, or diarrhea Genitourinary-Male: nocturia, frequency, hesitancy  Musculoskeletal:  arthritis of the knees, arthritis of the hips Neurological:  denies headaches, stroke, or TIA  ____________________________ PHYSICAL EXAMINATION VITAL SIGNS  Blood Pressure:  124/70 Sitting, Right arm, regular cuff  , 120/72 Standing, Right arm and regular cuff   Pulse:  72/min. Weight:  164.00  lbs. Height:  68"BMI: 25  Constitutional:  pleasant white male in no acute distress Skin:  warm and dry to touch, no apparent skin lesions, or masses noted. Head:  normocephalic, normal hair pattern, no masses or tenderness ENT:  ears, nose and throat reveal no gross abnormalities.  Dentition good. Neck:  supple, without massess. No JVD, thyromegaly or carotid bruits. Carotid upstroke normal. Chest:  normal symmetry, clear to auscultation. Cardiac:  regular rhythm, normal S1 and S2, no S3 or S4, grade 3/6 systolic murmur heard best at the apex Peripheral Pulses:  the femoral,dorsalis pedis, and posterior tibial pulses are full and equal bilaterally with no bruits auscultated. Extremities & Back:  no deformities, clubbing, cyanosis, erythema or edema observed. Normal muscle strength and tone. Neurological:  no gross motor or sensory deficits noted, affect appropriate, oriented x3. ____________________________ MOST RECENT LIPID PANEL 10/25/15  CHOL TOTL 164 mg/dl, LDL 69 NM, HDL 67 mg/dl and TRIGLYCER 75 mg/dl ____________________________ IMPRESSIONS/PLAN  1. Severe mitral regurgitation due partially flail posterior leaflet and her this is an is an 8 he is to rule and unless instructions and upper males is 2. History of carotid artery disease asymptomatic 3. History of prostate cancer 4. Hyperlipidemia 5. Remote history of paroxysmal atrial fibrillation years ago without recurrence  Recommendations:  He will need to have a transesophageal echocardiogram as well as cardiac catheterization. Cardiac catheterization was discussed with the patient including risks of myocardial infarction, death, stroke, bleeding, arrhythmia, dye allergy, or renal insufficiency. He understands and is willing to proceed. Possibility of percutaneous intervention at the same setting was also discussed with the patient including risks. He would like to proceed with these and we discussed the risks and benefits of  both with him. He would like to have these both done on the same day  if possible. ____________________________ TODAYS ORDERS  1. TEE: First Available  2. Right and Left Heart Cath: First Available                       ____________________________ Cardiology Physician:  Kerry Hough MD [facility]

## 2015-11-10 NOTE — Progress Notes (Signed)
Right brachial and right femoral sheaths pulled. Manual pressure held for 15 minutes to the femoral site and 10 minutes to the brachial site. Hemostasis obtained to both sites. Both sites level 0. No hematoma or bleeding noted. Vital signs stable. Patient to be transported to short stay.

## 2015-11-10 NOTE — Progress Notes (Signed)
  Echocardiogram Echocardiogram Transesophageal has been performed.  Jennette Dubin 11/10/2015, 11:55 AM

## 2015-11-10 NOTE — Interval H&P Note (Signed)
History and Physical Interval Note:  11/10/2015 10:06 AM  Samuel [name]  has presented today for surgery, with the diagnosis of MR   The various methods of treatment have been discussed with the patient and family. After consideration of risks, benefits and other options for treatment, the patient has consented to  Procedure(s): TRANSESOPHAGEAL ECHOCARDIOGRAM (TEE) (N/A) as a surgical intervention .  The patient's history has been reviewed, patient examined, no change in status, stable for surgery.  I have reviewed the patient's chart and labs.  Questions were answered to the patient's satisfaction.     Sharol Harness, MD 11/10/2015  10:06 AM

## 2015-11-10 NOTE — Discharge Instructions (Signed)
Radial Site Care °Refer to this sheet in the next few weeks. These instructions provide you with information about caring for yourself after your procedure. Your health care provider may also give you more specific instructions. Your treatment has been planned according to current medical practices, but problems sometimes occur. Call your health care provider if you have any problems or questions after your procedure. °WHAT TO EXPECT AFTER THE PROCEDURE °After your procedure, it is typical to have the following: °· Bruising at the radial site that usually fades within 1-2 weeks. °· Blood collecting in the tissue (hematoma) that may be painful to the touch. It should usually decrease in size and tenderness within 1-2 weeks. °HOME CARE INSTRUCTIONS °· Take medicines only as directed by your health care provider. °· You may shower 24-48 hours after the procedure or as directed by your health care provider. Remove the bandage (dressing) and gently wash the site with plain soap and water. Pat the area dry with a clean towel. Do not rub the site, because this may cause bleeding. °· Do not take baths, swim, or use a hot tub until your health care provider approves. °· Check your insertion site every day for redness, swelling, or drainage. °· Do not apply powder or lotion to the site. °· Do not flex or bend the affected arm for 24 hours or as directed by your health care provider. °· Do not push or pull heavy objects with the affected arm for 24 hours or as directed by your health care provider. °· Do not lift over 10 lb (4.5 kg) for 5 days after your procedure or as directed by your health care provider. °· Ask your health care provider when it is okay to: °¨ Return to work or school. °¨ Resume usual physical activities or sports. °¨ Resume sexual activity. °· Do not drive home if you are discharged the same day as the procedure. Have someone else drive you. °· You may drive 24 hours after the procedure unless otherwise  instructed by your health care provider. °· Do not operate machinery or power tools for 24 hours after the procedure. °· If your procedure was done as an outpatient procedure, which means that you went home the same day as your procedure, a responsible adult should be with you for the first 24 hours after you arrive home. °· Keep all follow-up visits as directed by your health care provider. This is important. °SEEK MEDICAL CARE IF: °· You have a fever. °· You have chills. °· You have increased bleeding from the radial site. Hold pressure on the site. CALL 911 °SEEK IMMEDIATE MEDICAL CARE IF: °· You have unusual pain at the radial site. °· You have redness, warmth, or swelling at the radial site. °· You have drainage (other than a small amount of blood on the dressing) from the radial site. °· The radial site is bleeding, and the bleeding does not stop after 30 minutes of holding steady pressure on the site. °· Your arm or hand becomes pale, cool, tingly, or numb. °  °This information is not intended to replace advice given to you by your health care provider. Make sure you discuss any questions you have with your health care provider. °  °Document Released: 11/10/2010 Document Revised: 10/29/2014 Document Reviewed: 04/26/2014 °Elsevier Interactive Patient Education ©2016 [employer] ° °

## 2015-11-10 NOTE — Progress Notes (Signed)
[facility] facility was called, I spoke with Mliss Sax Rn to alert them of post cath precautions. Pt lives in a separate home environment that has an emergency pull cord system for assistance.

## 2015-11-10 NOTE — CV Procedure (Signed)
Brief TEE Note  LVEF >55% Severe mitral regurgitation directed anteriorly.  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  For additional details see full report.  Dayzha Pogosyan C. Oval Linsey, MD, [facility]  11/10/2015 10:43 AM

## 2015-11-10 NOTE — Interval H&P Note (Signed)
History and Physical Interval Note:  11/10/2015 10:54 AM  Samuel [name]  has presented today for surgery, with the diagnosis of cp  The various methods of treatment have been discussed with the patient and family. After consideration of risks, benefits and other options for treatment, the patient has consented to  Procedure(s): Right/Left Heart Cath and Coronary Angiography (N/A) as a surgical intervention .  The patient's history has been reviewed, patient examined, no change in status, stable for surgery.  I have reviewed the patient's chart and labs.  Questions were answered to the patient's satisfaction.     Collier Salina [facility] 11/10/2015 10:55 AM

## 2015-11-11 ENCOUNTER — Encounter: Payer: Self-pay | Admitting: Internal Medicine

## 2015-11-11 ENCOUNTER — Encounter (HOSPITAL_COMMUNITY): Payer: Self-pay | Admitting: Cardiology

## 2015-11-21 ENCOUNTER — Other Ambulatory Visit: Payer: Self-pay | Admitting: *Deleted

## 2015-11-21 ENCOUNTER — Ambulatory Visit (INDEPENDENT_AMBULATORY_CARE_PROVIDER_SITE_OTHER): Payer: Medicare Other | Admitting: Thoracic Surgery (Cardiothoracic Vascular Surgery)

## 2015-11-21 ENCOUNTER — Encounter: Payer: Self-pay | Admitting: Thoracic Surgery (Cardiothoracic Vascular Surgery)

## 2015-11-21 VITALS — BP 135/83 | HR 61 | Resp 16 | Ht 67.5 in | Wt 163.0 lb

## 2015-11-21 DIAGNOSIS — I340 Nonrheumatic mitral (valve) insufficiency: Secondary | ICD-10-CM | POA: Diagnosis not present

## 2015-11-21 MED ORDER — AMIODARONE HCL 200 MG PO TABS: 200.0000 mg | ORAL_TABLET | Freq: Every day | ORAL | Status: DC

## 2015-11-21 NOTE — Patient Instructions (Signed)
Continue all previous medications without any changes at this time  Begin taking amiodarone 200 mg daily 7 days prior to your scheduled surgery

## 2015-11-21 NOTE — Progress Notes (Signed)
[address]       [place] [address]             [phone]     CARDIOTHORACIC SURGERY OFFICE NOTE  Referring Provider is [name], MD PCP is REED, [facility], DO   HPI:  Patient returns to the office today for follow-up of stage C severe asymptomatic primary mitral regurgitation.  He was originally seen in consultation on 10/27/2015. Since then he underwent TEE and diagnostic cardiac catheterization on 11/04/2015. TEE confirmed the presence of active valve prolapse with a large flail segment of the posterior leaflet and severe mitral regurgitation.  Left ventricular systolic function was normal with ejection fraction estimated 60-65%. There were no other complicating features. Left and right heart catheterization was notable for the presence of mild nonobstructive coronary artery disease. Right heart pressures were normal. The patient has subsequently undergone CT angiography and he returns to the office today to discuss the results of these tests and make further plans for treatment. He reports no new problems or complaints over the last few weeks.   Current Outpatient Prescriptions  Medication Sig Dispense Refill  . alfuzosin (UROXATRAL) 10 MG 24 hr tablet Take 10 mg by mouth daily with breakfast. Take one daily to reduce bladder spasm    . aspirin 81 MG tablet Take 81 mg by mouth. Take one daily for anticoagulation    . levothyroxine (SYNTHROID, LEVOTHROID) 25 MCG tablet Take 25 mcg by mouth daily before breakfast.    . Levothyroxine Sodium 50 MCG CAPS Take 1 capsule (50 mcg total) by mouth daily before breakfast. 90 capsule 3  . metoprolol succinate (TOPROL-XL) 25 MG 24 hr tablet TAKE ONE-HALF (1/2) TABLET EVERY OTHER DAY 45 tablet 0  . nabumetone (RELAFEN) 750 MG tablet TAKE 1 TABLET TWICE A DAY FOR SWELLING 180 tablet 1  . simvastatin (ZOCOR) 20 MG tablet TAKE 1 TABLET DAILY TO MAINTAIN LOW CHOLESTEROL 90 tablet 0  . solifenacin (VESICARE) 10 MG  tablet Take by mouth daily. Take one daily for bladder control     No current facility-administered medications for this visit.      Physical Exam:   BP 135/83 mmHg  Pulse 61  Resp 16  Ht 5' 7.5" (1.715 m)  Wt 163 lb (73.936 kg)  BMI 25.14 kg/m2  SpO2 96%  General:  Well-appearing  Chest:   Clear to auscultation  CV:   Regular rate and rhythm with holosystolic murmur  Incisions:  n/a  Abdomen:  Soft and nontender  Extremities:  Warm and well-perfused  Diagnostic Tests:  Transesophageal Echocardiography  Patient:  Samuel, [name] MR #:    [id number] Study Date: 11/10/2015 Gender:   M Age:    82 Height:   172.7 cm Weight:   73.9 kg BSA:    1.89 m^2 Pt. Status: Room:  ADMITTING  Skeet Latch, MD [place], MD [place], MD [place], MD [place], MD SONOGRAPHER Mikki Santee  cc:  ------------------------------------------------------------------- LV EF: 60% -  65%  ------------------------------------------------------------------- Indications:   MVD [non-rheumatic] 424.0.  ------------------------------------------------------------------- History:  PMH:  Coronary artery disease. Risk factors: Hypertension. Dyslipidemia.  ------------------------------------------------------------------- Study Conclusions  - Left ventricle: Systolic function was normal. The estimated ejection fraction was in the range of 60% to 65%. Wall motion was normal; there were no regional wall motion abnormalities. - Mitral valve: Flail motion due to rupture of the cords attached to  the posteromedial papillary muscle. There was severe regurgitation directed anteriorly. - Left atrium: No evidence of thrombus in the atrial cavity or appendage. No evidence of thrombus in the atrial cavity or appendage. - Right ventricle: The cavity size was  normal. Wall thickness was normal. Systolic function was normal. - Right atrium: No evidence of thrombus in the atrial cavity or appendage.  Diagnostic transesophageal echocardiography. 2D and color Doppler. Birthdate: Patient birthdate: August 18, 1932. Age: Patient is 82 yr old. Sex: Gender: male.  BMI: 24.8 kg/m^2. Blood pressure: 145/83 Patient status: Inpatient. Study date: Study date: 11/10/2015. Study time: 09:36 AM. Location: Endoscopy.  -------------------------------------------------------------------  ------------------------------------------------------------------- Left ventricle: Systolic function was normal. The estimated ejection fraction was in the range of 60% to 65%. Wall motion was normal; there were no regional wall motion abnormalities.  ------------------------------------------------------------------- Aortic valve:  Structurally normal valve. Trileaflet; normal thickness leaflets. Cusp separation was normal. Doppler: There was no significant regurgitation.  ------------------------------------------------------------------- Aorta: There was mild atheromatous plaque. There was no evidence for dissection. Aortic root: The aortic root was not dilated. Ascending aorta: The ascending aorta was normal in size. Aortic arch: The aortic arch was normal in size. Descending aorta: The descending aorta was normal in size.  ------------------------------------------------------------------- Mitral valve: Leaflet separation was normal. Flail motion due to rupture of the cords attached to the posteromedial papillary muscle. Doppler: There was severe regurgitation directed anteriorly.  ------------------------------------------------------------------- Left atrium: The atrium was normal in size. No evidence of thrombus in the atrial cavity or appendage. No evidence of thrombus in the atrial cavity or appendage. The appendage  was morphologically a left appendage, multilobulated, and of normal size. Emptying velocity was normal.  ------------------------------------------------------------------- Right ventricle: The cavity size was normal. Wall thickness was normal. Systolic function was normal.  ------------------------------------------------------------------- Pulmonic valve:  Structurally normal valve.  ------------------------------------------------------------------- Tricuspid valve:  Structurally normal valve.  Leaflet separation was normal. Doppler: There was no significant regurgitation.  ------------------------------------------------------------------- Pulmonary artery:  The main pulmonary artery was normal-sized.  ------------------------------------------------------------------- Right atrium: The atrium was normal in size. No evidence of thrombus in the atrial cavity or appendage. The appendage was morphologically a right appendage.  ------------------------------------------------------------------- Pericardium: There was no pericardial effusion.  ------------------------------------------------------------------- Measurements  Mitral valve               Value Mitral maximal regurg velocity, PISA   467  cm/s Mitral regurg VTI, PISA          178  cm Mitral ERO, PISA             0.46 cm^2 Mitral regurg volume, PISA        82  ml  Legend: (L) and (H) mark values outside specified reference range.  ------------------------------------------------------------------- Prepared and Electronically Authenticated by  Skeet Latch, MD 2017-01-19T16:37:53   CARDIAC CATHETERIZATION Procedures    Right/Left Heart Cath and Coronary Angiography    Conclusion     Mid LAD to Dist LAD lesion, 40% stenosed.  The left ventricular systolic function is normal.  1. Nonobstructive CAD 2. Normal LV function 3.  Severe MR 4. Normal right heart and LV filling pressures.   Plan: per Dr. Wynonia Lawman and Dr. Roxy Manns.     Indications    Severe mitral insufficiency [I34.0 (ICD-10-CM)]    Technique and Indications    Indication: 82 yo WM with severe MR secondary to ruptured chord.   Procedural Details: The right wrist was prepped, draped, and anesthetized with 1% lidocaine. Using the modified Seldinger technique a 6 Fr slender  sheath was placed in the right radial artery and a 5 French sheath was placed in the right brachial vein. We were unable to pass a wire or catheter via the brachial vein so we placed a 7 Fr. Venous sheath in the right femoral vein. A Swan-Ganz catheter was used for the right heart catheterization. Standard protocol was followed for recording of right heart pressures and sampling of oxygen saturations. Fick cardiac output was calculated. Standard Judkins catheters were used for selective coronary angiography and left ventriculography. There were no immediate procedural complications. The patient was transferred to the post catheterization recovery area for further monitoring.  Estimated blood loss <50 mL. There were no immediate complications during the procedure.    Coronary Findings    Dominance: Right   Left Anterior Descending   . Mid LAD to Dist LAD lesion, 40% stenosed. Discrete.     Left Circumflex   . First Obtuse Marginal Branch   The vessel is small in size.   . Third Obtuse Marginal Branch   The vessel is small in size.     Right Coronary Artery  . Vessel is large.       Right Heart Pressures LV EDP is normal.    Wall Motion                 Left Heart    Left Ventricle The left ventricular size is normal. The left ventricular systolic function is normal. The left ventricular ejection fraction is 55-65% by visual estimate. There are no wall motion abnormalities in the left ventricle.   Mitral Valve There is severe (4+) mitral regurgitation and  mitral valve prolapse present.    Coronary Diagrams    Diagnostic Diagram            Implants    Name ID Temporary Type Supply   No information to display    PACS Images    Show images for Cardiac catheterization     Link to Procedure Log    Procedure Log      Hemo Data       Most Recent Value   Fick Cardiac Output  6.18 L/min   Fick Cardiac Output Index  3.3 (L/min)/BSA   RA A Wave  6 mmHg   RA V Wave  3 mmHg   RA Mean  2 mmHg   RV Systolic Pressure  30 mmHg   RV Diastolic Pressure  0 mmHg   RV EDP  4 mmHg   PA Systolic Pressure  27 mmHg   PA Diastolic Pressure  9 mmHg   PA Mean  18 mmHg   PW A Wave  12 mmHg   PW V Wave  21 mmHg   PW Mean  11 mmHg   AO Systolic Pressure  Q000111Q mmHg   AO Diastolic Pressure  67 mmHg   AO Mean  93 mmHg   LV Systolic Pressure  [id number] mmHg   LV Diastolic Pressure  2 mmHg   LV EDP  18 mmHg   Arterial Occlusion Pressure Extended Systolic Pressure  0000000 mmHg   Arterial Occlusion Pressure Extended Diastolic Pressure  62 mmHg   Arterial Occlusion Pressure Extended Mean Pressure  94 mmHg   Left Ventricular Apex Extended Systolic Pressure  0000000 mmHg   Left Ventricular Apex Extended Diastolic Pressure  0 mmHg   Left Ventricular Apex Extended EDP Pressure  19 mmHg   QP/QS  1   TPVR Index  5.45 HRUI   TSVR  Index  28.16 HRUI   PVR SVR Ratio  0.08   TPVR/TSVR Ratio  0.19      CT ANGIOGRAPHY CHEST, ABDOMEN AND PELVIS  TECHNIQUE: Multidetector CT imaging through the chest, abdomen and pelvis was performed using the standard protocol during bolus administration of intravenous contrast. Multiplanar reconstructed images and MIPs were obtained and reviewed to evaluate the vascular anatomy.  CONTRAST: 75 mL Isovue 370 IV  COMPARISON: None.  FINDINGS: CTA CHEST FINDINGS  The thoracic aorta is normally patent and shows no evidence of aneurysmal disease or stenosis. Proximal great vessels  show normal branching anatomy and are widely patent.  The left atrium is enlarged. Pulmonary venous anatomy appears normal. Probable calcified plaque in the distribution of the distal LAD. No evidence of pleural or pericardial fluid. Lungs show no edema, focal airspace disease or nodules. No enlarged lymph nodes are seen. Bony structures are unremarkable.  Review of the MIP images confirms the above findings.  CTA ABDOMEN AND PELVIS FINDINGS  The abdominal aorta shows normal patency without evidence of aneurysm or stenosis. The celiac axis, superior mesenteric artery, bilateral single renal arteries and inferior mesenteric artery show normal patency.  Bilateral iliac arteries show moderate tortuosity and scattered calcified plaque without evidence of aneurysmal disease or significant obstructive disease. Internal and external iliac arteries as well as the common femoral arteries are normally patent. Femoral bifurcation show normal patency and are normally located.  Brachytherapy seeds are seen throughout the prostate gland. There are a few calcified gallstones in the gallbladder a small hiatal hernia is present. The liver, pancreas, spleen, adrenal glands and kidneys are unremarkable. Bowel loops are of normal caliber. No incidental mass lesions, enlarged lymph nodes or hernias are seen. Degenerative disc disease of the lumbar spine present at L4-5 and L5-S1.  Review of the MIP images confirms the above findings.  IMPRESSION: 1. Normal patent thoracic aorta and visualized proximal great vessels. 2. Left atrial enlargement. 3. Calcified plaque in the distribution of the LAD. 4. Normal patency of abdominal aorta, iliac arteries and common femoral arteries. The iliac arteries are moderately tortuous.   Electronically Signed  By: Aletta Edouard M.D.  On: 11/04/2015 10:31         Impression:  Patient has stage C severe asymptomatic primary mitral  regurgitation.  I have personally reviewed the patient's recent TEE, catheterization, and CT angiograms. He has a large flail segment of the posterior leaflet with severe mitral regurgitation coursing anteriorly around the left atrium.  Left ventricular systolic function remains preserved. Based upon review of the patient's recent TEE I feel there is a very high likelihood that his valve should be repairable.  Diagnostic cardiac catheterization is notable for the absence of significant coronary artery disease and normal right heart pressures. CT angiography is notable for the absence of significant aneurysmal or aortoiliac occlusive disease. The patient appears to be a good candidate for minimally invasive approach to surgery and operative risks should be relatively low. Options include continued close observation on medical therapy versus proceeding with elective surgery in the near future.   Plan:  The patient was again counseled at length regarding the indications, risks and potential benefits of mitral valve repair.  The rationale for elective surgery has been explained, including a comparison between surgery and continued medical therapy with close follow-up.  The likelihood of successful and durable valve repair has been discussed with particular reference to the findings of their recent echocardiogram.  Based upon these findings and previous experience,  I have quoted them a greater than 95 percent likelihood of successful valve repair.  The patient is interested in proceeding with surgery in the near future, although he desires to wait until sometime in March so as to make appropriate arrangements for the care of his wife while he is recovering from surgery. We tenderly plan to proceed with surgery on Wednesday, 01/04/2016. The patient will return for follow-up on Monday, 01/02/2016 prior to surgery. He has been given a prescription for amiodarone to begin 1 week prior to surgery to decrease his risk of  perioperative atrial arrhythmias.   I spent in excess of 15 minutes during the conduct of this office consultation and >50% of this time involved direct face-to-face encounter with the patient for counseling and/or coordination of their care.  [name], MD 11/21/2015 1:51 PM

## 2015-11-23 ENCOUNTER — Ambulatory Visit: Payer: Medicare Other

## 2015-11-23 ENCOUNTER — Encounter: Payer: Medicare Other | Admitting: Internal Medicine

## 2015-11-23 DIAGNOSIS — Z23 Encounter for immunization: Secondary | ICD-10-CM | POA: Diagnosis not present

## 2015-11-24 ENCOUNTER — Ambulatory Visit: Payer: Medicare Other

## 2015-12-11 ENCOUNTER — Other Ambulatory Visit: Payer: Self-pay | Admitting: Internal Medicine

## 2015-12-18 ENCOUNTER — Other Ambulatory Visit: Payer: Self-pay | Admitting: Internal Medicine

## 2016-01-02 ENCOUNTER — Ambulatory Visit (HOSPITAL_COMMUNITY)
Admission: RE | Admit: 2016-01-02 | Discharge: 2016-01-02 | Disposition: A | Discharge status: Home / Self Care | Payer: Medicare Other | Source: Ambulatory Visit | Attending: Thoracic Surgery (Cardiothoracic Vascular Surgery) | Admitting: Thoracic Surgery (Cardiothoracic Vascular Surgery)

## 2016-01-02 ENCOUNTER — Encounter (HOSPITAL_COMMUNITY)
Admission: RE | Admit: 2016-01-02 | Discharge: 2016-01-02 | Disposition: A | Discharge status: Home / Self Care | Payer: Medicare Other | Source: Ambulatory Visit | Attending: Thoracic Surgery (Cardiothoracic Vascular Surgery) | Admitting: Thoracic Surgery (Cardiothoracic Vascular Surgery)

## 2016-01-02 ENCOUNTER — Ambulatory Visit (INDEPENDENT_AMBULATORY_CARE_PROVIDER_SITE_OTHER): Payer: Medicare Other | Admitting: Thoracic Surgery (Cardiothoracic Vascular Surgery)

## 2016-01-02 ENCOUNTER — Encounter: Payer: Self-pay | Admitting: Thoracic Surgery (Cardiothoracic Vascular Surgery)

## 2016-01-02 ENCOUNTER — Encounter (HOSPITAL_COMMUNITY): Payer: Self-pay

## 2016-01-02 ENCOUNTER — Ambulatory Visit (HOSPITAL_BASED_OUTPATIENT_CLINIC_OR_DEPARTMENT_OTHER)
Admission: RE | Admit: 2016-01-02 | Discharge: 2016-01-02 | Disposition: A | Discharge status: Home / Self Care | Payer: Medicare Other | Source: Ambulatory Visit | Attending: Thoracic Surgery (Cardiothoracic Vascular Surgery) | Admitting: Thoracic Surgery (Cardiothoracic Vascular Surgery)

## 2016-01-02 VITALS — BP 130/69 | HR 53 | Temp 97.3°F | Resp 18 | Ht 68.0 in | Wt 167.8 lb

## 2016-01-02 VITALS — BP 127/71 | HR 74 | Resp 20 | Ht 68.0 in | Wt 167.0 lb

## 2016-01-02 DIAGNOSIS — J449 Chronic obstructive pulmonary disease, unspecified: Secondary | ICD-10-CM

## 2016-01-02 DIAGNOSIS — I340 Nonrheumatic mitral (valve) insufficiency: Secondary | ICD-10-CM | POA: Diagnosis not present

## 2016-01-02 DIAGNOSIS — Z7982 Long term (current) use of aspirin: Secondary | ICD-10-CM | POA: Insufficient documentation

## 2016-01-02 DIAGNOSIS — I10 Essential (primary) hypertension: Secondary | ICD-10-CM | POA: Insufficient documentation

## 2016-01-02 DIAGNOSIS — Z8546 Personal history of malignant neoplasm of prostate: Secondary | ICD-10-CM

## 2016-01-02 DIAGNOSIS — K219 Gastro-esophageal reflux disease without esophagitis: Secondary | ICD-10-CM | POA: Insufficient documentation

## 2016-01-02 DIAGNOSIS — Z79899 Other long term (current) drug therapy: Secondary | ICD-10-CM | POA: Insufficient documentation

## 2016-01-02 DIAGNOSIS — I480 Paroxysmal atrial fibrillation: Secondary | ICD-10-CM | POA: Diagnosis not present

## 2016-01-02 DIAGNOSIS — I2510 Atherosclerotic heart disease of native coronary artery without angina pectoris: Secondary | ICD-10-CM

## 2016-01-02 DIAGNOSIS — Z01818 Encounter for other preprocedural examination: Secondary | ICD-10-CM | POA: Insufficient documentation

## 2016-01-02 DIAGNOSIS — Z0183 Encounter for blood typing: Secondary | ICD-10-CM

## 2016-01-02 DIAGNOSIS — Z01812 Encounter for preprocedural laboratory examination: Secondary | ICD-10-CM | POA: Insufficient documentation

## 2016-01-02 DIAGNOSIS — R001 Bradycardia, unspecified: Secondary | ICD-10-CM

## 2016-01-02 DIAGNOSIS — Q211 Atrial septal defect: Secondary | ICD-10-CM | POA: Diagnosis not present

## 2016-01-02 DIAGNOSIS — I6523 Occlusion and stenosis of bilateral carotid arteries: Secondary | ICD-10-CM | POA: Insufficient documentation

## 2016-01-02 DIAGNOSIS — E785 Hyperlipidemia, unspecified: Secondary | ICD-10-CM

## 2016-01-02 DIAGNOSIS — E039 Hypothyroidism, unspecified: Secondary | ICD-10-CM | POA: Insufficient documentation

## 2016-01-02 DIAGNOSIS — D62 Acute posthemorrhagic anemia: Secondary | ICD-10-CM | POA: Diagnosis not present

## 2016-01-02 DIAGNOSIS — I341 Nonrheumatic mitral (valve) prolapse: Secondary | ICD-10-CM | POA: Diagnosis not present

## 2016-01-02 DIAGNOSIS — Z87891 Personal history of nicotine dependence: Secondary | ICD-10-CM

## 2016-01-02 HISTORY — DX: Cardiac arrhythmia, unspecified: I49.9

## 2016-01-02 HISTORY — DX: Gastro-esophageal reflux disease without esophagitis: K21.9

## 2016-01-02 HISTORY — DX: Adverse effect of unspecified anesthetic, initial encounter: T41.45XA

## 2016-01-02 LAB — CBC
HEMATOCRIT: 39.1 % (ref 39.0–52.0)
HEMOGLOBIN: 12.8 g/dL — AB (ref 13.0–17.0)
MCH: 29.7 pg (ref 26.0–34.0)
MCHC: 32.7 g/dL (ref 30.0–36.0)
MCV: 90.7 fL (ref 78.0–100.0)
Platelets: 196 10*3/uL (ref 150–400)
RBC: 4.31 MIL/uL (ref 4.22–5.81)
RDW: 13.2 % (ref 11.5–15.5)
WBC: 4.1 10*3/uL (ref 4.0–10.5)

## 2016-01-02 LAB — PULMONARY FUNCTION TEST
DL/VA % pred: 53 %
DL/VA: 2.4 ml/min/mmHg/L
DLCO unc % pred: 53 %
DLCO unc: 15.93 ml/min/mmHg
FEF 25-75 Post: 2.04 L/sec
FEF 25-75 Pre: 2.36 L/sec
FEF2575-%CHANGE-POST: -13 %
FEF2575-%PRED-PRE: 139 %
FEF2575-%Pred-Post: 120 %
FEV1-%CHANGE-POST: -2 %
FEV1-%PRED-POST: 118 %
FEV1-%Pred-Pre: 121 %
FEV1-PRE: 3.08 L
FEV1-Post: 2.99 L
FEV1FVC-%CHANGE-POST: -3 %
FEV1FVC-%Pred-Pre: 105 %
FEV6-%Change-Post: 0 %
FEV6-%PRED-PRE: 121 %
FEV6-%Pred-Post: 120 %
FEV6-PRE: 4.06 L
FEV6-Post: 4.03 L
FEV6FVC-%Change-Post: -1 %
FEV6FVC-%PRED-PRE: 106 %
FEV6FVC-%Pred-Post: 104 %
FVC-%CHANGE-POST: 0 %
FVC-%PRED-PRE: 113 %
FVC-%Pred-Post: 114 %
FVC-POST: 4.14 L
FVC-Pre: 4.11 L
POST FEV6/FVC RATIO: 97 %
PRE FEV1/FVC RATIO: 75 %
PRE FEV6/FVC RATIO: 99 %
Post FEV1/FVC ratio: 72 %
RV % pred: 95 %
RV: 2.48 L
TLC % pred: 100 %
TLC: 6.72 L

## 2016-01-02 LAB — TYPE AND SCREEN
ABO/RH(D): A POS
Antibody Screen: NEGATIVE

## 2016-01-02 LAB — COMPREHENSIVE METABOLIC PANEL
ALK PHOS: 66 U/L (ref 38–126)
ALT: 23 U/L (ref 17–63)
AST: 27 U/L (ref 15–41)
Albumin: 3.4 g/dL — ABNORMAL LOW (ref 3.5–5.0)
Anion gap: 10 (ref 5–15)
BILIRUBIN TOTAL: 1 mg/dL (ref 0.3–1.2)
BUN: 20 mg/dL (ref 6–20)
CO2: 24 mmol/L (ref 22–32)
CREATININE: 1.17 mg/dL (ref 0.61–1.24)
Calcium: 9 mg/dL (ref 8.9–10.3)
Chloride: 107 mmol/L (ref 101–111)
GFR calc Af Amer: >60 mL/min (ref >60)
GFR, EST NON AFRICAN AMERICAN: 56 mL/min — AB (ref >60)
Glucose, Bld: 73 mg/dL (ref 65–99)
Potassium: 4.1 mmol/L (ref 3.5–5.1)
Sodium: 141 mmol/L (ref 135–145)
TOTAL PROTEIN: 6.2 g/dL — AB (ref 6.5–8.1)

## 2016-01-02 LAB — BLOOD GAS, ARTERIAL
Acid-base deficit: 1.2 mmol/L (ref 0.0–2.0)
Bicarbonate: 22.2 mEq/L (ref 20.0–24.0)
DRAWN BY: 449841
FIO2: 0.21
O2 Saturation: 94.7 %
PCO2 ART: 31.8 mmHg — AB (ref 35.0–45.0)
PH ART: 7.457 — AB (ref 7.350–7.450)
Patient temperature: 98.6
TCO2: 23.2 mmol/L (ref 0–100)
pO2, Arterial: 72.5 mmHg — ABNORMAL LOW (ref 80.0–100.0)

## 2016-01-02 LAB — URINALYSIS, ROUTINE W REFLEX MICROSCOPIC
Bilirubin Urine: NEGATIVE
GLUCOSE, UA: NEGATIVE mg/dL
Hgb urine dipstick: NEGATIVE
Ketones, ur: NEGATIVE mg/dL
LEUKOCYTES UA: NEGATIVE
NITRITE: NEGATIVE
PH: 5 (ref 5.0–8.0)
PROTEIN: NEGATIVE mg/dL
Specific Gravity, Urine: 1.014 (ref 1.005–1.030)

## 2016-01-02 LAB — PROTIME-INR
INR: 1.14 (ref 0.00–1.49)
PROTHROMBIN TIME: 14.8 s (ref 11.6–15.2)

## 2016-01-02 LAB — ABO/RH: ABO/RH(D): A POS

## 2016-01-02 LAB — SURGICAL PCR SCREEN
MRSA, PCR: NEGATIVE
STAPHYLOCOCCUS AUREUS: NEGATIVE

## 2016-01-02 LAB — APTT: aPTT: 30 seconds (ref 24–37)

## 2016-01-02 MED ORDER — ALBUTEROL SULFATE (2.5 MG/3ML) 0.083% IN NEBU
2.5000 mg | INHALATION_SOLUTION | Freq: Once | RESPIRATORY_TRACT | Status: AC
  Administered 2016-01-02: 2.5 mg via RESPIRATORY_TRACT

## 2016-01-02 NOTE — Progress Notes (Signed)
VASCULAR LAB PRELIMINARY  PRELIMINARY  PRELIMINARY  PRELIMINARY  Pre-op Cardiac Surgery  Carotid Findings:  Bilateral:  1-39% ICA stenosis.  Vertebral artery flow is antegrade.     Upper Extremity Right Left  Brachial Pressures 124 Triphasic 115 Triphasic  Radial Waveforms Triphasic Triphasic  Ulnar Waveforms Triphasic Triphasic  Palmar Arch (Allen's Test) Normal Normal   Findings:  Doppler waveforms remained normal bilaterally wuth both radial and ulnar compressions.   Jacai Kipp, RVS 01/02/2016, 3:44 PM

## 2016-01-02 NOTE — Pre-Procedure Instructions (Addendum)
Moriah Huffines  01/02/2016      [facility] - [place], [place] [address] [place] [address] Phone: [phone] Fax: [place], [address] [place] [address] Phone: [phone] Fax: [phone]  [facility] [address] [place], [place] [address] Phone: [phone] Fax: [phone]    Your procedure is scheduled on 01/04/16.  Report to [facility] Admitting at 630 A.M.  Call this number if you have problems the morning of surgery:  [phone]   Remember:  Do not eat food or drink liquids after midnight.  Take these medicines the morning of surgery with A SIP OF WATER amiodarone(pacerone),levothyroxine,vesicare  STOP all herbel meds, nsaids (aleve,naproxen,advil,ibuprofen) including vitamins,nabumatone   Stop/continue aspirin per dr instruction   Do not wear jewelry, make-up or nail polish.  Do not wear lotions, powders, or perfumes.  You may wear deodorant.  Do not shave 48 hours prior to surgery.  Men may shave face and neck.  Do not bring valuables to the hospital.  [facility] is not responsible for any belongings or valuables.  Contacts, dentures or bridgework may not be worn into surgery.  Leave your suitcase in the car.  After surgery it may be brought to your room.  For patients admitted to the hospital, discharge time will be determined by your treatment team.  Patients discharged the day of surgery will not be allowed to drive home.   Name and phone number of your driver:    Special instructions:   Special Instructions: [place] - Preparing for Surgery  Before surgery, you can play an important role.  Because skin is not sterile, your skin needs to be as free of germs as possible.  You can reduce the number of germs on you skin by washing  with CHG (chlorahexidine gluconate) soap before surgery.  CHG is an antiseptic cleaner which kills germs and bonds with the skin to continue killing germs even after washing.  Please DO NOT use if you have an allergy to CHG or antibacterial soaps.  If your skin becomes reddened/irritated stop using the CHG and inform your nurse when you arrive at Short Stay.  Do not shave (including legs and underarms) for at least 48 hours prior to the first CHG shower.  You may shave your face.  Please follow these instructions carefully:   1.  Shower with CHG Soap the night before surgery and the morning of Surgery.  2.  If you choose to wash your hair, wash your hair first as usual with your normal shampoo.  3.  After you shampoo, rinse your hair and body thoroughly to remove the Shampoo.  4.  Use CHG as you would any other liquid soap.  You can apply chg directly  to the skin and wash gently with scrungie or a clean washcloth.  5.  Apply the CHG Soap to your body ONLY FROM THE NECK DOWN.  Do not use on open wounds or open sores.  Avoid contact with your eyes ears, mouth and genitals (private parts).  Wash genitals (private parts)       with your normal soap.  6.  Wash thoroughly, paying special attention to the area where your surgery will be performed.  7.  Thoroughly rinse your body with warm water from the neck down.  8.  DO NOT shower/wash with your normal soap after using and rinsing off the CHG Soap.  9.  Pat yourself dry with a clean towel.            10.  Wear clean pajamas.            11.  Place clean sheets on your bed the night of your first shower and do not sleep with pets.  Day of Surgery  Do not apply any lotions/deodorants the morning of surgery.  Please wear clean clothes to the hospital/surgery center.  Please read over the following fact sheets that you were given. Pain Booklet, Coughing and Deep Breathing, Blood Transfusion Information, MRSA Information and Surgical Site Infection  Prevention

## 2016-01-02 NOTE — Progress Notes (Signed)
Samuel [name]       [place] [address]             [phone]     CARDIOTHORACIC SURGERY OFFICE NOTE  Referring Provider is Samuel Reedy, MD PCP is Samuel Kinnier, DO   HPI:  Patient returns to the office today for follow-up of severe primary mitral regurgitation with tentative plans to proceed with elective mitral valve repair later this week. He was last seen here in our office on 11/21/2015. Since then he has continued to remain clinically stable. He reports no new problems or complaints and is eager to proceed with surgery as previously planned.   Current Outpatient Prescriptions  Medication Sig Dispense Refill  . alfuzosin (UROXATRAL) 10 MG 24 hr tablet Take 10 mg by mouth daily with breakfast. Take one daily to reduce bladder spasm    . amiodarone (PACERONE) 200 MG tablet Take 1 tablet (200 mg total) by mouth daily. 14 tablet 0  . aspirin 81 MG tablet Take 81 mg by mouth daily. Take one daily for anticoagulation    . Levothyroxine Sodium 50 MCG CAPS Take 1 capsule (50 mcg total) by mouth daily before breakfast. 90 capsule 3  . metoprolol succinate (TOPROL-XL) 25 MG 24 hr tablet TAKE ONE-HALF (1/2) TABLET EVERY OTHER DAY 45 tablet 0  . nabumetone (RELAFEN) 750 MG tablet TAKE 1 TABLET TWICE A DAY FOR SWELLING 270 tablet 2  . simvastatin (ZOCOR) 20 MG tablet TAKE 1 TABLET DAILY TO MAINTAIN LOW CHOLESTEROL 90 tablet 1  . solifenacin (VESICARE) 5 MG tablet Take 5 mg by mouth daily.     No current facility-administered medications for this visit.      Physical Exam:   BP 127/71 mmHg  Pulse 74  Resp 20  Ht 5\' 8"  (1.727 m)  Wt 167 lb (75.751 kg)  BMI 25.40 kg/m2  SpO2 97%  General:  Well-appearing  Chest:   Clear  CV:   Regular rate and rhythm with systolic murmur  Incisions:  n/a  Abdomen:  Soft and nontender  Extremities:  Warm and well-perfused  Diagnostic Tests:  n/a   Impression:  Patient has stage C severe asymptomatic primary  mitral regurgitation. I have personally reviewed the patient's recent TEE, catheterization, and CT angiograms. He has a large flail segment of the posterior leaflet with severe mitral regurgitation coursing anteriorly around the left atrium. Left ventricular systolic function remains preserved. Based upon review of the patient's recent TEE I feel there is a very high likelihood that his valve should be repairable. Diagnostic cardiac catheterization is notable for the absence of significant coronary artery disease and normal right heart pressures. CT angiography is notable for the absence of significant aneurysmal or aortoiliac occlusive disease. The patient appears to be a good candidate for minimally invasive approach to surgery and operative risks should be relatively low. Options include continued close observation on medical therapy versus proceeding with elective surgery in the near future.   Plan:  The patient was again counseled at length regarding the indications, risks, and potential benefits of mitral valve repair. The rationale for elective surgery has been explained including comparison between surgery and continued medical therapy with close follow-up. The likelihood of successful and durable valve repairs been discussed. Expectations for the patient's postoperative convalescence of been discussed.   Based upon these findings and previous experience, I have quoted them a greater than 90 percent likelihood of successful valve repair.  In the  unlikely event that their valve cannot be successfully repaired, we discussed the possibility of replacing the mitral valve using a mechanical prosthesis with the attendant need for long-term anticoagulation versus the alternative of replacing it using a bioprosthetic tissue valve with its potential for late structural valve deterioration and failure, depending upon the patient's longevity.  The patient specifically requests that if the mitral valve must be  replaced that it be done using a bioprosthetic tissue valve.   The patient understands and accepts all potential risks of surgery including but not limited to risk of death, stroke or other neurologic complication, myocardial infarction, congestive heart failure, respiratory failure, renal failure, bleeding requiring transfusion and/or reexploration, arrhythmia, infection or other wound complications, pneumonia, pleural and/or pericardial effusion, pulmonary embolus, aortic dissection or other major vascular complication, or delayed complications related to valve repair or replacement including but not limited to structural valve deterioration and failure, thrombosis, embolization, endocarditis, or paravalvular leak.  Alternative surgical approaches have been discussed including a comparison between conventional sternotomy and minimally-invasive techniques.  The relative risks and benefits of each have been reviewed as they pertain to the patient's specific circumstances, and all of their questions have been addressed.  Specific risks potentially related to the minimally-invasive approach were discussed at length, including but not limited to risk of conversion to full or partial sternotomy, aortic dissection or other major vascular complication, unilateral acute lung injury or pulmonary edema, phrenic nerve dysfunction or paralysis, rib fracture, chronic pain, lung hernia, or lymphocele.  All of his questions have been answered.   I spent in excess of 15 minutes during the conduct of this office consultation and >50% of this time involved direct face-to-face encounter with the patient for counseling and/or coordination of their care.   Samuel Gu. Roxy Manns, MD 01/02/2016 3:53 PM

## 2016-01-02 NOTE — Patient Instructions (Signed)
   Patient should continue taking all current medications without change through the day before surgery.  Patient should have nothing to eat or drink after midnight the night before surgery.  On the morning of surgery patient should take only levothyroxine (thyroid pill) and Toprol-XL (heart pill) with a sip of water.

## 2016-01-03 LAB — HEMOGLOBIN A1C
HEMOGLOBIN A1C: 5.5 % (ref 4.8–5.6)
Mean Plasma Glucose: 111 mg/dL

## 2016-01-03 MED ORDER — DEXTROSE 5 % IV SOLN
1.5000 g | INTRAVENOUS | Status: AC
  Administered 2016-01-04: .75 g via INTRAVENOUS
  Administered 2016-01-04: 1.5 g via INTRAVENOUS
  Filled 2016-01-03: qty 1.5

## 2016-01-03 MED ORDER — VANCOMYCIN HCL 1000 MG IV SOLR
INTRAVENOUS | Status: AC
  Administered 2016-01-04: 1000 mL
  Filled 2016-01-03: qty 1000

## 2016-01-03 MED ORDER — NITROGLYCERIN IN D5W 200-5 MCG/ML-% IV SOLN
2.0000 ug/min | INTRAVENOUS | Status: AC
  Administered 2016-01-04: 5 ug/min via INTRAVENOUS
  Filled 2016-01-03: qty 250

## 2016-01-03 MED ORDER — PLASMA-LYTE 148 IV SOLN
INTRAVENOUS | Status: AC
  Administered 2016-01-04: 500 mL
  Filled 2016-01-03: qty 2.5

## 2016-01-03 MED ORDER — POTASSIUM CHLORIDE 2 MEQ/ML IV SOLN
80.0000 meq | INTRAVENOUS | Status: DC
  Filled 2016-01-03: qty 40

## 2016-01-03 MED ORDER — SODIUM CHLORIDE 0.9 % IV SOLN
INTRAVENOUS | Status: DC
  Filled 2016-01-03: qty 40

## 2016-01-03 MED ORDER — METOPROLOL TARTRATE 12.5 MG HALF TABLET: 12.5000 mg | ORAL_TABLET | Freq: Once | ORAL | Status: DC

## 2016-01-03 MED ORDER — CHLORHEXIDINE GLUCONATE 0.12 % MT SOLN: 15.0000 mL | Freq: Once | OROMUCOSAL | Status: DC

## 2016-01-03 MED ORDER — VANCOMYCIN HCL 10 G IV SOLR
1250.0000 mg | INTRAVENOUS | Status: AC
  Administered 2016-01-04: 1250 mg via INTRAVENOUS
  Filled 2016-01-03: qty 1250

## 2016-01-03 MED ORDER — DEXMEDETOMIDINE HCL IN NACL 400 MCG/100ML IV SOLN
0.1000 ug/kg/h | INTRAVENOUS | Status: DC
  Filled 2016-01-03: qty 100

## 2016-01-03 MED ORDER — GLUTARALDEHYDE 0.625% SOAKING SOLUTION
TOPICAL | Status: DC | PRN
  Filled 2016-01-03: qty 50

## 2016-01-03 MED ORDER — MAGNESIUM SULFATE 50 % IJ SOLN
40.0000 meq | INTRAMUSCULAR | Status: DC
  Filled 2016-01-03: qty 10

## 2016-01-03 MED ORDER — INSULIN REGULAR HUMAN 100 UNIT/ML IJ SOLN
INTRAMUSCULAR | Status: DC
  Filled 2016-01-03: qty 2.5

## 2016-01-03 MED ORDER — SODIUM CHLORIDE 0.9 % IV SOLN
INTRAVENOUS | Status: DC
  Filled 2016-01-03: qty 30

## 2016-01-03 MED ORDER — EPINEPHRINE HCL 1 MG/ML IJ SOLN
0.0000 ug/min | INTRAVENOUS | Status: DC
  Filled 2016-01-03: qty 4

## 2016-01-03 MED ORDER — PHENYLEPHRINE HCL 10 MG/ML IJ SOLN
30.0000 ug/min | INTRAMUSCULAR | Status: DC
  Filled 2016-01-03: qty 2

## 2016-01-03 MED ORDER — DEXTROSE 5 % IV SOLN
750.0000 mg | INTRAVENOUS | Status: DC
  Filled 2016-01-03: qty 750

## 2016-01-03 MED ORDER — DOPAMINE-DEXTROSE 3.2-5 MG/ML-% IV SOLN
0.0000 ug/kg/min | INTRAVENOUS | Status: DC
  Filled 2016-01-03: qty 250

## 2016-01-03 NOTE — Progress Notes (Signed)
Anesthesia Chart Review: Patient is a 82 year old male scheduled for minimally invasive MV repair on 01/04/16 by Dr. Roxy Manns.  History includes severe mitral regurgitation with large flail segment of the posterior leaflet, remote former smoker, hypothyroidism, PAF (per cardiology note, patient had remote episode), palpitations, mild non-obstructive CAD (40% mid LAD 10/2015 cath), HTN, BPH, prostate cancer s/p radioactive seed implant, HLD, GERD, carotid artery disease (< 40%) 10/2015. Aortoiliac occlusive disease is also listed but 10/2015 CTA of the abd/pelvis showed moderately tortuous iliac arteries but normal patency of the abdominal aorta, iliac arteries, and common femoral arteries. For anesthesia history, he reported a possible reaction to sodium pentathol in 1955.   PCP is Dr. [name]. Cardiologist is Dr. Viona Gilmore. Tollie Eth.   Meds include Uroxatral, amiodarone, ASA 81 mg, levothyroxine, Toprol XL, Relafen, Zocor, Vesicare.  11/10/15 TEE: Study Conclusions - Left ventricle: Systolic function was normal. The estimated  ejection fraction was in the range of 60% to 65%. Wall motion was  normal; there were no regional wall motion abnormalities. - Mitral valve: Flail motion due to rupture of the cords attached  to the posteromedial papillary muscle. There was severe  regurgitation directed anteriorly. - Left atrium: No evidence of thrombus in the atrial cavity or  appendage. No evidence of thrombus in the atrial cavity or  appendage. - Right ventricle: The cavity size was normal. Wall thickness was  normal. Systolic function was normal. - Right atrium: No evidence of thrombus in the atrial cavity or  appendage.  11/10/15 RHC/LHC:  Mid LAD to Dist LAD lesion, 40% stenosed.  The left ventricular systolic function is normal. 1. Nonobstructive CAD 2. Normal LV function. EF 55-65% by visual estimate. 3. Severe MR 4. Normal right heart and LV filling pressures.   01/02/16 Carotid U/S  (Preliminary): Bilateral: 1-39% ICA stenosis. Vertebral artery flow is antegrade.  01/02/16 PFTs: FVC 4.11 (113%), FEV1 3.08 (121%), DLCOunc 15.93 (53%).  Preoperative EKG, CXR, ABG, and labs noted.   If no acute changes then I anticipate that he can proceed as planned.  George Hugh [facility] Stay Center/Anesthesiology Phone [phone] 01/03/2016 11:00 AM

## 2016-01-03 NOTE — H&P (Signed)
[address]       [place] [address]             [phone]          CARDIOTHORACIC SURGERY HISTORY AND PHYSICAL EXAM  Referring Provider is [name], MD PCP is [name], DO  Chief Complaint  Patient presents with  . Mitral Regurgitation    Surgical eval, ECHO 09/26/15- Dr Wynonia Lawman    HPI:  Patient is an 82 year old male with history of hypertension and prostate cancer who has otherwise been remarkably healthy most of his life but has been referred for surgical consultation to discuss treatment options for recently discovered mitral valve prolapse with severe mitral regurgitation. The patient is retired from the [employer] and lived for many years in [place] where he was followed by a cardiologist because of history of heart murmur and abnormal electrocardiogram. He states that he has been told that he had a slight heart murmur for most of his life but this did not affect his ability to be in the [employer]. There is no reported history of rheumatic fever. Prior to elective surgery for radioactive seed implantation for management of the patient's prostate cancer the patient was noted to have abnormal EKG and referred for cardiology evaluation. He carries the diagnosis of left atrial enlargement and paroxysmal atrial fibrillation. He states that the irregular heart rhythm was discovered only on a Holter monitor study that was performed after the patient suffered a brief syncopal event several years ago. Approximately 5 years ago the patient moved to [place] where he now lives with his wife at the [facility] community. For the last several years he has been followed by Dr. Wynonia Lawman who recently noted a prominent systolic murmur on physical exam that was noticeably different in comparison with previous exams. Subsequent transthoracic echocardiogram revealed findings consistent with mitral valve prolapse with possible flail segment of the  posterior leaflet and severe mitral regurgitation. The patient was referred for surgical consultation.  The patient remains very active physically and functionally completely independent. He lives in a town house with his wife. His wife has significant short-term memory dysfunction related to dementia but still resides at home with him and remains reasonably functional. The patient exercises regularly, including swimming, aerobic exercise on a treadmill, and weight lifting. He denies any symptoms of exertional shortness of breath or fatigue. He denies any significant problems with PND, orthopnea, or lower extremity edema. He reports no other significant physical limitations other than some problems related to chronic arthritis and arthralgias in both knees. He specifically denies any history of exertional chest pain or chest tightness. He has never had any palpitations, dizzy spells, nor syncope.   Patient returns to the office today for follow-up of severe primary mitral regurgitation with tentative plans to proceed with elective mitral valve repair later this week. He was last seen here in our office on 11/21/2015. Since then he has continued to remain clinically stable. He reports no new problems or complaints and is eager to proceed with surgery as previously planned.         Past Medical History  Diagnosis Date  . Pain in joint, lower leg 02/25/2012  . Unspecified hypothyroidism 08/27/2011  . Palpitations 08/14/2010  . Cortical senile cataract 06/27/2010  . Malignant neoplasm of prostate ([facility]) 06/21/2010  . Other and unspecified hyperlipidemia 06/21/2010  . Unspecified essential hypertension 06/21/2010  . Unspecified constipation 06/21/2010  . Hypertrophy of  prostate without urinary obstruction and other lower urinary tract symptoms (LUTS) 06/21/2010  . Osteoarthrosis, unspecified whether generalized or localized, unspecified site 06/21/2010  . Contracture of palmar fascia 06/21/2010  .  Enthesopathy of hip region 11/27/2009  . Urinary frequency 08/14/2008  . Nocturia 2011  . Mitral regurgitation 10/07/2015    Severe MR noted on ECHO with partially flail post leaflet Oct 03 2015   . Essential hypertension, benign 06/21/2010  . Hypothyroidism 08/27/2011  . Hyperlipidemia LDL goal <100 06/21/2010  . Complication of anesthesia 1955    sodium pentathol ?  Marland Kitchen Dysrhythmia   . Carotid artery disease ([place])   . Aortoiliac occlusive disease ([place])   . GERD (gastroesophageal reflux disease)     occ    Past Surgical History  Procedure Laterality Date  . Total knee arthroplasty Right 1955    Dr. Ethel Rana  . Knee arthroscopy with meniscal repair Left 1968  . Cataract extraction w/ intraocular lens  implant, bilateral  2006  . Cardiac catheterization N/A 11/10/2015    Procedure: Right/Left Heart Cath and Coronary Angiography;  Surgeon: Peter M Martinique, MD;  Location: [facility];  Service: Cardiovascular;  Laterality: N/A;  . Tee without cardioversion N/A 11/10/2015    Procedure: TRANSESOPHAGEAL ECHOCARDIOGRAM (TEE);  Surgeon: Skeet Latch, MD;  Location[facility] ENDOSCOPY;  Service: Cardiovascular;  Laterality: N/A;  . Prostate surgery  2000    seed implants    Family History  Problem Relation Age of Onset  . Heart disease Mother     myocardial infarction  . Heart disease Father     myocardial infarction    Social History Social History  Substance Use Topics  . Smoking status: Former Smoker -- 0.50 packs/day for 5 years    Types: Cigarettes    Quit date: 09/04/1969  . Smokeless tobacco: Never Used  . Alcohol Use: 1.2 oz/week    2 Shots of liquor per week     Comment: 2 daily    Prior to Admission medications   Medication Sig Start Date End Date Taking? Authorizing Provider  alfuzosin (UROXATRAL) 10 MG 24 hr tablet Take 10 mg by mouth daily with breakfast. Take one daily to reduce bladder spasm   Yes Historical Provider, MD  amiodarone (PACERONE) 200 MG tablet Take 1  tablet (200 mg total) by mouth daily. 11/21/15  Yes Rexene Alberts, MD  aspirin 81 MG tablet Take 81 mg by mouth daily. Take one daily for anticoagulation   Yes Historical Provider, MD  Levothyroxine Sodium 50 MCG CAPS Take 1 capsule (50 mcg total) by mouth daily before breakfast. 11/02/15  Yes Tiffany L Reed, DO  metoprolol succinate (TOPROL-XL) 25 MG 24 hr tablet TAKE ONE-HALF (1/2) TABLET EVERY OTHER DAY 07/04/13  Yes Estill Dooms, MD  nabumetone (RELAFEN) 750 MG tablet TAKE 1 TABLET TWICE A DAY FOR SWELLING 12/19/15  Yes Tiffany L Reed, DO  simvastatin (ZOCOR) 20 MG tablet TAKE 1 TABLET DAILY TO MAINTAIN LOW CHOLESTEROL 12/12/15  Yes Tiffany L Reed, DO  solifenacin (VESICARE) 5 MG tablet Take 5 mg by mouth daily.   Yes Historical Provider, MD    No Known Allergies    Review of Systems:  General:normal appetite, normal energy, no weight gain, no weight loss, no fever Cardiac:no chest pain with exertion, no chest pain at rest, no SOB with exertion, no resting SOB, no PND, no orthopnea, no palpitations, no arrhythmia, no atrial fibrillation, no LE edema, no dizzy spells, no syncope Respiratory:no  shortness of breath, no home oxygen, no productive cough, no dry cough, no bronchitis, no wheezing, no hemoptysis, no asthma, no pain with inspiration or cough, no sleep apnea, no CPAP at night GI:no difficulty swallowing, no reflux, no frequent heartburn, no hiatal hernia, no abdominal pain, no constipation, no diarrhea, no hematochezia, no hematemesis, no melena GU:no dysuria, + frequency, no urinary tract infection, no hematuria, no enlarged prostate, no kidney stones, no kidney disease Vascular:no pain suggestive of claudication, no pain in feet, no leg cramps, no varicose veins, no DVT,  no non-healing foot ulcer Neuro:no stroke, no TIA's, no seizures, no headaches, no temporary blindness one eye, no slurred speech, no peripheral neuropathy, no chronic pain, no instability of gait, no memory/cognitive dysfunction Musculoskeletal:+ arthritis, + joint swelling, no myalgias, no difficulty walking, normal mobility  Skin:no rash, no itching, no skin infections, no pressure sores or ulcerations Psych:no anxiety, no depression, no nervousness, no unusual recent stress Eyes:no blurry vision, no floaters, no recent vision changes, + wears glasses or contacts ENT:no hearing loss, no loose or painful teeth, no dentures, last saw dentist within the past year Hematologic:no easy bruising, no abnormal bleeding, no clotting disorder, no frequent epistaxis Endocrine:no diabetes, does not check CBG's at home   Physical Exam:  BP 140/80 mmHg  Pulse 60  Resp 20  Ht 5\' 8"  (1.727 m)  Wt 163 lb (73.936 kg)  BMI 24.79 kg/m2  SpO2 98% General:Thin, well-appearing, looks younger than stated age HEENT:Unremarkable  Neck:no JVD, no bruits, no adenopathy  Chest:clear to auscultation, symmetrical breath sounds, no wheezes, no rhonchi  CV:RRR, prominent grade IV/VI holosystolic murmur  Abdomen:soft, non-tender, no masses  Extremities:warm, well-perfused, pulses palpable but somewhat diminished, no LE  edema Rectal/GUDeferred Neuro:Grossly non-focal and symmetrical throughout Skin:Clean and dry, no rashes, no breakdown    Diagnostic Tests:  Transesophageal Echocardiography  Patient:  Taki, Currier MR #:    [id number] Study Date: 11/10/2015 Gender:   M Age:    56 Height:   172.7 cm Weight:   73.9 kg BSA:    1.89 m^2 Pt. Status: Room:  ADMITTING  Skeet Latch, MD [place], MD [place], MD [place], MD [place], MD SONOGRAPHER Mikki Santee  cc:  ------------------------------------------------------------------- LV EF: 60% -  65%  ------------------------------------------------------------------- Indications:   MVD [non-rheumatic] 424.0.  ------------------------------------------------------------------- History:  PMH:  Coronary artery disease. Risk factors: Hypertension. Dyslipidemia.  ------------------------------------------------------------------- Study Conclusions  - Left ventricle: Systolic function was normal. The estimated ejection fraction was in the range of 60% to 65%. Wall motion was normal; there were no regional wall motion abnormalities. - Mitral valve: Flail motion due to rupture of the cords attached to the posteromedial papillary muscle. There was severe regurgitation directed anteriorly. - Left atrium: No evidence of thrombus in the atrial cavity or appendage. No evidence of thrombus in the atrial cavity or appendage. - Right ventricle: The cavity size was normal. Wall thickness was normal. Systolic function was normal. - Right atrium: No evidence of thrombus in the atrial cavity or appendage.  Diagnostic transesophageal echocardiography. 2D and color Doppler. Birthdate: Patient  birthdate: April 25, 1932. Age: Patient is 82 yr old. Sex: Gender: male.  BMI: 24.8 kg/m^2. Blood pressure: 145/83 Patient status: Inpatient. Study date: Study date: 11/10/2015. Study time: 09:36 AM. Location: Endoscopy.  -------------------------------------------------------------------  ------------------------------------------------------------------- Left ventricle: Systolic function was normal. The estimated ejection fraction was in the range of 60% to 65%. Wall motion was normal; there  were no regional wall motion abnormalities.  ------------------------------------------------------------------- Aortic valve:  Structurally normal valve. Trileaflet; normal thickness leaflets. Cusp separation was normal. Doppler: There was no significant regurgitation.  ------------------------------------------------------------------- Aorta: There was mild atheromatous plaque. There was no evidence for dissection. Aortic root: The aortic root was not dilated. Ascending aorta: The ascending aorta was normal in size. Aortic arch: The aortic arch was normal in size. Descending aorta: The descending aorta was normal in size.  ------------------------------------------------------------------- Mitral valve: Leaflet separation was normal. Flail motion due to rupture of the cords attached to the posteromedial papillary muscle. Doppler: There was severe regurgitation directed anteriorly.  ------------------------------------------------------------------- Left atrium: The atrium was normal in size. No evidence of thrombus in the atrial cavity or appendage. No evidence of thrombus in the atrial cavity or appendage. The appendage was morphologically a left appendage, multilobulated, and of normal size. Emptying velocity was normal.  ------------------------------------------------------------------- Right ventricle: The cavity size was normal. Wall thickness was normal.  Systolic function was normal.  ------------------------------------------------------------------- Pulmonic valve:  Structurally normal valve.  ------------------------------------------------------------------- Tricuspid valve:  Structurally normal valve.  Leaflet separation was normal. Doppler: There was no significant regurgitation.  ------------------------------------------------------------------- Pulmonary artery:  The main pulmonary artery was normal-sized.  ------------------------------------------------------------------- Right atrium: The atrium was normal in size. No evidence of thrombus in the atrial cavity or appendage. The appendage was morphologically a right appendage.  ------------------------------------------------------------------- Pericardium: There was no pericardial effusion.  ------------------------------------------------------------------- Measurements  Mitral valve               Value Mitral maximal regurg velocity, PISA   467  cm/s Mitral regurg VTI, PISA          178  cm Mitral ERO, PISA             0.46 cm^2 Mitral regurg volume, PISA        82  ml  Legend: (L) and (H) mark values outside specified reference range.  ------------------------------------------------------------------- Prepared and Electronically Authenticated by  Skeet Latch, MD 2017-01-19T16:37:53   CARDIAC CATHETERIZATION Procedures    Right/Left Heart Cath and Coronary Angiography    Conclusion     Mid LAD to Dist LAD lesion, 40% stenosed.  The left ventricular systolic function is normal.  1. Nonobstructive CAD 2. Normal LV function 3. Severe MR 4. Normal right heart and LV filling pressures.   Plan: per Dr. Wynonia Lawman and Dr. Roxy Manns.     Indications    Severe mitral insufficiency [I34.0 (ICD-10-CM)]    Technique and Indications    Indication: 82 yo WM with  severe MR secondary to ruptured chord.   Procedural Details: The right wrist was prepped, draped, and anesthetized with 1% lidocaine. Using the modified Seldinger technique a 6 Fr slender sheath was placed in the right radial artery and a 5 French sheath was placed in the right brachial vein. We were unable to pass a wire or catheter via the brachial vein so we placed a 7 Fr. Venous sheath in the right femoral vein. A Swan-Ganz catheter was used for the right heart catheterization. Standard protocol was followed for recording of right heart pressures and sampling of oxygen saturations. Fick cardiac output was calculated. Standard Judkins catheters were used for selective coronary angiography and left ventriculography. There were no immediate procedural complications. The patient was transferred to the post catheterization recovery area for further monitoring.  Estimated blood loss <50 mL. There were no immediate complications during the procedure.    Coronary Findings    Dominance: Right  Left Anterior Descending   . Mid LAD to Dist LAD lesion, 40% stenosed. Discrete.     Left Circumflex   . First Obtuse Marginal Branch   The vessel is small in size.   . Third Obtuse Marginal Branch   The vessel is small in size.     Right Coronary Artery  . Vessel is large.       Right Heart Pressures LV EDP is normal.    Wall Motion                 Left Heart    Left Ventricle The left ventricular size is normal. The left ventricular systolic function is normal. The left ventricular ejection fraction is 55-65% by visual estimate. There are no wall motion abnormalities in the left ventricle.   Mitral Valve There is severe (4+) mitral regurgitation and mitral valve prolapse present.    Coronary Diagrams    Diagnostic Diagram            Implants    Name ID Temporary Type Supply   No information to display     PACS Images    Show images for Cardiac catheterization     Link to Procedure Log    Procedure Log      Hemo Data       Most Recent Value   Fick Cardiac Output  6.18 L/min   Fick Cardiac Output Index  3.3 (L/min)/BSA   RA A Wave  6 mmHg   RA V Wave  3 mmHg   RA Mean  2 mmHg   RV Systolic Pressure  30 mmHg   RV Diastolic Pressure  0 mmHg   RV EDP  4 mmHg   PA Systolic Pressure  27 mmHg   PA Diastolic Pressure  9 mmHg   PA Mean  18 mmHg   PW A Wave  12 mmHg   PW V Wave  21 mmHg   PW Mean  11 mmHg   AO Systolic Pressure  Q000111Q mmHg   AO Diastolic Pressure  67 mmHg   AO Mean  93 mmHg   LV Systolic Pressure  [id number] mmHg   LV Diastolic Pressure  2 mmHg   LV EDP  18 mmHg   Arterial Occlusion Pressure Extended Systolic Pressure  0000000 mmHg   Arterial Occlusion Pressure Extended Diastolic Pressure  62 mmHg   Arterial Occlusion Pressure Extended Mean Pressure  94 mmHg   Left Ventricular Apex Extended Systolic Pressure  0000000 mmHg   Left Ventricular Apex Extended Diastolic Pressure  0 mmHg   Left Ventricular Apex Extended EDP Pressure  19 mmHg   QP/QS  1   TPVR Index  5.45 HRUI   TSVR Index  28.16 HRUI   PVR SVR Ratio  0.08   TPVR/TSVR Ratio  0.19      CT ANGIOGRAPHY CHEST, ABDOMEN AND PELVIS  TECHNIQUE: Multidetector CT imaging through the chest, abdomen and pelvis was performed using the standard protocol during bolus administration of intravenous contrast. Multiplanar reconstructed images and MIPs were obtained and reviewed to evaluate the vascular anatomy.  CONTRAST: 75 mL Isovue 370 IV  COMPARISON: None.  FINDINGS: CTA CHEST FINDINGS  The thoracic aorta is normally patent and shows no evidence of aneurysmal disease or stenosis. Proximal great vessels show normal branching anatomy  and are widely patent.  The left atrium is enlarged. Pulmonary venous anatomy appears normal. Probable calcified plaque in the distribution of the distal LAD. No evidence of pleural  or pericardial fluid. Lungs show no edema, focal airspace disease or nodules. No enlarged lymph nodes are seen. Bony structures are unremarkable.  Review of the MIP images confirms the above findings.  CTA ABDOMEN AND PELVIS FINDINGS  The abdominal aorta shows normal patency without evidence of aneurysm or stenosis. The celiac axis, superior mesenteric artery, bilateral single renal arteries and inferior mesenteric artery show normal patency.  Bilateral iliac arteries show moderate tortuosity and scattered calcified plaque without evidence of aneurysmal disease or significant obstructive disease. Internal and external iliac arteries as well as the common femoral arteries are normally patent. Femoral bifurcation show normal patency and are normally located.  Brachytherapy seeds are seen throughout the prostate gland. There are a few calcified gallstones in the gallbladder a small hiatal hernia is present. The liver, pancreas, spleen, adrenal glands and kidneys are unremarkable. Bowel loops are of normal caliber. No incidental mass lesions, enlarged lymph nodes or hernias are seen. Degenerative disc disease of the lumbar spine present at L4-5 and L5-S1.  Review of the MIP images confirms the above findings.  IMPRESSION: 1. Normal patent thoracic aorta and visualized proximal great vessels. 2. Left atrial enlargement. 3. Calcified plaque in the distribution of the LAD. 4. Normal patency of abdominal aorta, iliac arteries and common femoral arteries. The iliac arteries are moderately tortuous.   Electronically Signed  By: Aletta Edouard M.D.  On: 11/04/2015 10:31            Impression:  Patient has stage C severe asymptomatic primary mitral regurgitation. I have  personally reviewed the patient's recent TEE, catheterization, and CT angiograms. He has a large flail segment of the posterior leaflet with severe mitral regurgitation coursing anteriorly around the left atrium. Left ventricular systolic function remains preserved. Based upon review of the patient's recent TEE I feel there is a very high likelihood that his valve should be repairable. Diagnostic cardiac catheterization is notable for the absence of significant coronary artery disease and normal right heart pressures. CT angiography is notable for the absence of significant aneurysmal or aortoiliac occlusive disease. The patient appears to be a good candidate for minimally invasive approach to surgery and operative risks should be relatively low. Options include continued close observation on medical therapy versus proceeding with elective surgery in the near future.   Plan:  The patient was again counseled at length regarding the indications, risks, and potential benefits of mitral valve repair. The rationale for elective surgery has been explained including comparison between surgery and continued medical therapy with close follow-up. The likelihood of successful and durable valve repairs been discussed. Expectations for the patient's postoperative convalescence of been discussed. Based upon these findings and previous experience, I have quoted them a greater than 90 percent likelihood of successful valve repair. In the unlikely event that their valve cannot be successfully repaired, we discussed the possibility of replacing the mitral valve using a mechanical prosthesis with the attendant need for long-term anticoagulation versus the alternative of replacing it using a bioprosthetic tissue valve with its potential for late structural valve deterioration and failure, depending upon the patient's longevity. The patient specifically requests that if the mitral valve must be replaced that it be done using  a bioprosthetic tissue valve. The patient understands and accepts all potential risks of surgery including but not limited to risk of death, stroke or other neurologic complication, myocardial infarction, congestive heart failure, respiratory failure, renal failure, bleeding requiring transfusion and/or reexploration, arrhythmia, infection or other wound complications, pneumonia, pleural and/or  pericardial effusion, pulmonary embolus, aortic dissection or other major vascular complication, or delayed complications related to valve repair or replacement including but not limited to structural valve deterioration and failure, thrombosis, embolization, endocarditis, or paravalvular leak. Alternative surgical approaches have been discussed including a comparison between conventional sternotomy and minimally-invasive techniques. The relative risks and benefits of each have been reviewed as they pertain to the patient's specific circumstances, and all of their questions have been addressed. Specific risks potentially related to the minimally-invasive approach were discussed at length, including but not limited to risk of conversion to full or partial sternotomy, aortic dissection or other major vascular complication, unilateral acute lung injury or pulmonary edema, phrenic nerve dysfunction or paralysis, rib fracture, chronic pain, lung hernia, or lymphocele. All of his questions have been answered.   [name], MD 01/02/2016 3:53 PM

## 2016-01-04 ENCOUNTER — Inpatient Hospital Stay (HOSPITAL_COMMUNITY): Payer: Medicare Other

## 2016-01-04 ENCOUNTER — Inpatient Hospital Stay (HOSPITAL_COMMUNITY)
Admission: RE | Admit: 2016-01-04 | Discharge: 2016-01-10 | DRG: 220 | Disposition: A | Discharge status: Home Health | Payer: Medicare Other | Source: Ambulatory Visit | Attending: Thoracic Surgery (Cardiothoracic Vascular Surgery) | Admitting: Thoracic Surgery (Cardiothoracic Vascular Surgery)

## 2016-01-04 ENCOUNTER — Inpatient Hospital Stay (HOSPITAL_COMMUNITY): Payer: Medicare Other | Admitting: Anesthesiology

## 2016-01-04 ENCOUNTER — Encounter (HOSPITAL_COMMUNITY)
Admission: RE | Disposition: A | Discharge status: Home Health | Payer: Self-pay | Source: Ambulatory Visit | Attending: Thoracic Surgery (Cardiothoracic Vascular Surgery)

## 2016-01-04 ENCOUNTER — Encounter (HOSPITAL_COMMUNITY): Payer: Self-pay | Admitting: Thoracic Surgery (Cardiothoracic Vascular Surgery)

## 2016-01-04 DIAGNOSIS — Z9841 Cataract extraction status, right eye: Secondary | ICD-10-CM | POA: Diagnosis not present

## 2016-01-04 DIAGNOSIS — I341 Nonrheumatic mitral (valve) prolapse: Secondary | ICD-10-CM | POA: Diagnosis not present

## 2016-01-04 DIAGNOSIS — J90 Pleural effusion, not elsewhere classified: Secondary | ICD-10-CM | POA: Diagnosis not present

## 2016-01-04 DIAGNOSIS — Z961 Presence of intraocular lens: Secondary | ICD-10-CM | POA: Diagnosis present

## 2016-01-04 DIAGNOSIS — R918 Other nonspecific abnormal finding of lung field: Secondary | ICD-10-CM | POA: Diagnosis not present

## 2016-01-04 DIAGNOSIS — K219 Gastro-esophageal reflux disease without esophagitis: Secondary | ICD-10-CM | POA: Diagnosis not present

## 2016-01-04 DIAGNOSIS — D62 Acute posthemorrhagic anemia: Secondary | ICD-10-CM | POA: Diagnosis not present

## 2016-01-04 DIAGNOSIS — I10 Essential (primary) hypertension: Secondary | ICD-10-CM | POA: Diagnosis present

## 2016-01-04 DIAGNOSIS — I480 Paroxysmal atrial fibrillation: Secondary | ICD-10-CM | POA: Diagnosis not present

## 2016-01-04 DIAGNOSIS — Z87891 Personal history of nicotine dependence: Secondary | ICD-10-CM

## 2016-01-04 DIAGNOSIS — Z8546 Personal history of malignant neoplasm of prostate: Secondary | ICD-10-CM

## 2016-01-04 DIAGNOSIS — D696 Thrombocytopenia, unspecified: Secondary | ICD-10-CM | POA: Diagnosis not present

## 2016-01-04 DIAGNOSIS — R0602 Shortness of breath: Secondary | ICD-10-CM | POA: Diagnosis not present

## 2016-01-04 DIAGNOSIS — E039 Hypothyroidism, unspecified: Secondary | ICD-10-CM | POA: Diagnosis present

## 2016-01-04 DIAGNOSIS — J9811 Atelectasis: Secondary | ICD-10-CM | POA: Diagnosis not present

## 2016-01-04 DIAGNOSIS — Z96651 Presence of right artificial knee joint: Secondary | ICD-10-CM | POA: Diagnosis present

## 2016-01-04 DIAGNOSIS — Q2112 Patent foramen ovale: Secondary | ICD-10-CM

## 2016-01-04 DIAGNOSIS — M1990 Unspecified osteoarthritis, unspecified site: Secondary | ICD-10-CM | POA: Diagnosis present

## 2016-01-04 DIAGNOSIS — Z952 Presence of prosthetic heart valve: Secondary | ICD-10-CM | POA: Diagnosis not present

## 2016-01-04 DIAGNOSIS — I340 Nonrheumatic mitral (valve) insufficiency: Secondary | ICD-10-CM | POA: Diagnosis not present

## 2016-01-04 DIAGNOSIS — Z9842 Cataract extraction status, left eye: Secondary | ICD-10-CM | POA: Diagnosis not present

## 2016-01-04 DIAGNOSIS — Z7982 Long term (current) use of aspirin: Secondary | ICD-10-CM

## 2016-01-04 DIAGNOSIS — I2510 Atherosclerotic heart disease of native coronary artery without angina pectoris: Secondary | ICD-10-CM | POA: Diagnosis not present

## 2016-01-04 DIAGNOSIS — Q211 Atrial septal defect: Secondary | ICD-10-CM

## 2016-01-04 DIAGNOSIS — Z9889 Other specified postprocedural states: Secondary | ICD-10-CM

## 2016-01-04 DIAGNOSIS — E785 Hyperlipidemia, unspecified: Secondary | ICD-10-CM | POA: Diagnosis present

## 2016-01-04 DIAGNOSIS — R05 Cough: Secondary | ICD-10-CM | POA: Diagnosis not present

## 2016-01-04 HISTORY — PX: CLIPPING OF ATRIAL APPENDAGE: SHX5773

## 2016-01-04 HISTORY — DX: Other specified postprocedural states: Z98.890

## 2016-01-04 HISTORY — PX: MITRAL VALVE REPAIR: SHX2039

## 2016-01-04 HISTORY — DX: Atrial septal defect: Q21.1

## 2016-01-04 HISTORY — DX: Patent foramen ovale: Q21.12

## 2016-01-04 HISTORY — PX: TEE WITHOUT CARDIOVERSION: SHX5443

## 2016-01-04 LAB — POCT I-STAT, CHEM 8
BUN: 19 mg/dL (ref 6–20)
BUN: 17 mg/dL (ref 6–20)
BUN: 19 mg/dL (ref 6–20)
BUN: 18 mg/dL (ref 6–20)
BUN: 17 mg/dL (ref 6–20)
BUN: 18 mg/dL (ref 6–20)
BUN: 17 mg/dL (ref 6–20)
CALCIUM ION: 1.11 mmol/L — AB (ref 1.13–1.30)
CALCIUM ION: 1.01 mmol/L — AB (ref 1.13–1.30)
CALCIUM ION: 1.06 mmol/L — AB (ref 1.13–1.30)
CALCIUM ION: 1.11 mmol/L — AB (ref 1.13–1.30)
CHLORIDE: 105 mmol/L (ref 101–111)
CHLORIDE: 102 mmol/L (ref 101–111)
CREATININE: 0.7 mg/dL (ref 0.61–1.24)
CREATININE: 1 mg/dL (ref 0.61–1.24)
CREATININE: 0.7 mg/dL (ref 0.61–1.24)
Calcium, Ion: 1.05 mmol/L — ABNORMAL LOW (ref 1.13–1.30)
Calcium, Ion: 1.15 mmol/L (ref 1.13–1.30)
Calcium, Ion: 1.06 mmol/L — ABNORMAL LOW (ref 1.13–1.30)
Chloride: 104 mmol/L (ref 101–111)
Chloride: 104 mmol/L (ref 101–111)
Chloride: 106 mmol/L (ref 101–111)
Chloride: 111 mmol/L (ref 101–111)
Chloride: 109 mmol/L (ref 101–111)
Creatinine, Ser: 0.9 mg/dL (ref 0.61–1.24)
Creatinine, Ser: 0.6 mg/dL — ABNORMAL LOW (ref 0.61–1.24)
Creatinine, Ser: 0.9 mg/dL (ref 0.61–1.24)
Creatinine, Ser: 0.9 mg/dL (ref 0.61–1.24)
GLUCOSE: 124 mg/dL — AB (ref 65–99)
GLUCOSE: 158 mg/dL — AB (ref 65–99)
GLUCOSE: 180 mg/dL — AB (ref 65–99)
Glucose, Bld: 109 mg/dL — ABNORMAL HIGH (ref 65–99)
Glucose, Bld: 118 mg/dL — ABNORMAL HIGH (ref 65–99)
Glucose, Bld: 175 mg/dL — ABNORMAL HIGH (ref 65–99)
Glucose, Bld: 114 mg/dL — ABNORMAL HIGH (ref 65–99)
HCT: 34 % — ABNORMAL LOW (ref 39.0–52.0)
HCT: 26 % — ABNORMAL LOW (ref 39.0–52.0)
HCT: 28 % — ABNORMAL LOW (ref 39.0–52.0)
HCT: 27 % — ABNORMAL LOW (ref 39.0–52.0)
HCT: 33 % — ABNORMAL LOW (ref 39.0–52.0)
HEMATOCRIT: 33 % — AB (ref 39.0–52.0)
HEMATOCRIT: 28 % — AB (ref 39.0–52.0)
HEMOGLOBIN: 9.5 g/dL — AB (ref 13.0–17.0)
HEMOGLOBIN: 9.2 g/dL — AB (ref 13.0–17.0)
HEMOGLOBIN: 9.5 g/dL — AB (ref 13.0–17.0)
HEMOGLOBIN: 11.2 g/dL — AB (ref 13.0–17.0)
Hemoglobin: 11.6 g/dL — ABNORMAL LOW (ref 13.0–17.0)
Hemoglobin: 11.2 g/dL — ABNORMAL LOW (ref 13.0–17.0)
Hemoglobin: 8.8 g/dL — ABNORMAL LOW (ref 13.0–17.0)
POTASSIUM: 3.8 mmol/L (ref 3.5–5.1)
Potassium: 3.8 mmol/L (ref 3.5–5.1)
Potassium: 3.8 mmol/L (ref 3.5–5.1)
Potassium: 4.4 mmol/L (ref 3.5–5.1)
Potassium: 4.6 mmol/L (ref 3.5–5.1)
Potassium: 5.1 mmol/L (ref 3.5–5.1)
Potassium: 4.5 mmol/L (ref 3.5–5.1)
SODIUM: 140 mmol/L (ref 135–145)
SODIUM: 135 mmol/L (ref 135–145)
SODIUM: 137 mmol/L (ref 135–145)
SODIUM: 139 mmol/L (ref 135–145)
Sodium: 138 mmol/L (ref 135–145)
Sodium: 140 mmol/L (ref 135–145)
Sodium: 138 mmol/L (ref 135–145)
TCO2: 24 mmol/L (ref 0–100)
TCO2: 22 mmol/L (ref 0–100)
TCO2: 26 mmol/L (ref 0–100)
TCO2: 27 mmol/L (ref 0–100)
TCO2: 21 mmol/L (ref 0–100)
TCO2: 23 mmol/L (ref 0–100)
TCO2: 20 mmol/L (ref 0–100)

## 2016-01-04 LAB — POCT I-STAT 3, ART BLOOD GAS (G3+)
ACID-BASE DEFICIT: 2 mmol/L (ref 0.0–2.0)
ACID-BASE DEFICIT: 3 mmol/L — AB (ref 0.0–2.0)
ACID-BASE DEFICIT: 4 mmol/L — AB (ref 0.0–2.0)
Acid-base deficit: 4 mmol/L — ABNORMAL HIGH (ref 0.0–2.0)
Acid-base deficit: 6 mmol/L — ABNORMAL HIGH (ref 0.0–2.0)
BICARBONATE: 23.8 meq/L (ref 20.0–24.0)
Bicarbonate: 20.5 mEq/L (ref 20.0–24.0)
Bicarbonate: 22.1 mEq/L (ref 20.0–24.0)
Bicarbonate: 20.2 mEq/L (ref 20.0–24.0)
Bicarbonate: 19.3 mEq/L — ABNORMAL LOW (ref 20.0–24.0)
O2 SAT: 100 %
O2 SAT: 97 %
O2 Saturation: 98 %
O2 Saturation: 88 %
O2 Saturation: 96 %
PCO2 ART: 33.7 mmHg — AB (ref 35.0–45.0)
PCO2 ART: 37.4 mmHg (ref 35.0–45.0)
PCO2 ART: 35.7 mmHg (ref 35.0–45.0)
PH ART: 7.392 (ref 7.350–7.450)
PH ART: 7.377 (ref 7.350–7.450)
PH ART: 7.34 — AB (ref 7.350–7.450)
PO2 ART: 106 mmHg — AB (ref 80.0–100.0)
PO2 ART: 54 mmHg — AB (ref 80.0–100.0)
Patient temperature: 36.1
Patient temperature: 37.3
Patient temperature: 36.8
TCO2: 25 mmol/L (ref 0–100)
TCO2: 22 mmol/L (ref 0–100)
TCO2: 23 mmol/L (ref 0–100)
TCO2: 21 mmol/L (ref 0–100)
TCO2: 20 mmol/L (ref 0–100)
pCO2 arterial: 42.1 mmHg (ref 35.0–45.0)
pCO2 arterial: 33.9 mmHg — ABNORMAL LOW (ref 35.0–45.0)
pH, Arterial: 7.36 (ref 7.350–7.450)
pH, Arterial: 7.384 (ref 7.350–7.450)
pO2, Arterial: 434 mmHg — ABNORMAL HIGH (ref 80.0–100.0)
pO2, Arterial: 88 mmHg (ref 80.0–100.0)
pO2, Arterial: 84 mmHg (ref 80.0–100.0)

## 2016-01-04 LAB — CBC
HEMATOCRIT: 34.9 % — AB (ref 39.0–52.0)
HEMATOCRIT: 33.7 % — AB (ref 39.0–52.0)
HEMOGLOBIN: 11.3 g/dL — AB (ref 13.0–17.0)
Hemoglobin: 11.6 g/dL — ABNORMAL LOW (ref 13.0–17.0)
MCH: 30.1 pg (ref 26.0–34.0)
MCH: 30.2 pg (ref 26.0–34.0)
MCHC: 33.2 g/dL (ref 30.0–36.0)
MCHC: 33.5 g/dL (ref 30.0–36.0)
MCV: 90.6 fL (ref 78.0–100.0)
MCV: 90.1 fL (ref 78.0–100.0)
Platelets: 122 10*3/uL — ABNORMAL LOW (ref 150–400)
Platelets: 122 10*3/uL — ABNORMAL LOW (ref 150–400)
RBC: 3.85 MIL/uL — AB (ref 4.22–5.81)
RBC: 3.74 MIL/uL — ABNORMAL LOW (ref 4.22–5.81)
RDW: 13.1 % (ref 11.5–15.5)
RDW: 13.2 % (ref 11.5–15.5)
WBC: 8.2 10*3/uL (ref 4.0–10.5)
WBC: 9.2 10*3/uL (ref 4.0–10.5)

## 2016-01-04 LAB — GLUCOSE, CAPILLARY
GLUCOSE-CAPILLARY: 115 mg/dL — AB (ref 65–99)
GLUCOSE-CAPILLARY: 118 mg/dL — AB (ref 65–99)
GLUCOSE-CAPILLARY: 75 mg/dL (ref 65–99)
Glucose-Capillary: 68 mg/dL (ref 65–99)
Glucose-Capillary: 69 mg/dL (ref 65–99)
Glucose-Capillary: 84 mg/dL (ref 65–99)

## 2016-01-04 LAB — HEMOGLOBIN AND HEMATOCRIT, BLOOD
HCT: 28 % — ABNORMAL LOW (ref 39.0–52.0)
Hemoglobin: 9.2 g/dL — ABNORMAL LOW (ref 13.0–17.0)

## 2016-01-04 LAB — PLATELET COUNT: Platelets: 110 10*3/uL — ABNORMAL LOW (ref 150–400)

## 2016-01-04 LAB — CREATININE, SERUM
CREATININE: 1.04 mg/dL (ref 0.61–1.24)
GFR calc Af Amer: >60 mL/min (ref >60)
GFR calc non Af Amer: >60 mL/min (ref >60)

## 2016-01-04 LAB — APTT: APTT: 31 s (ref 24–37)

## 2016-01-04 LAB — PROTIME-INR
INR: 1.55 — AB (ref 0.00–1.49)
Prothrombin Time: 18.6 seconds — ABNORMAL HIGH (ref 11.6–15.2)

## 2016-01-04 LAB — POCT I-STAT 4, (NA,K, GLUC, HGB,HCT)
Glucose, Bld: 130 mg/dL — ABNORMAL HIGH (ref 65–99)
HCT: 33 % — ABNORMAL LOW (ref 39.0–52.0)
HEMOGLOBIN: 11.2 g/dL — AB (ref 13.0–17.0)
POTASSIUM: 3.7 mmol/L (ref 3.5–5.1)
Sodium: 140 mmol/L (ref 135–145)

## 2016-01-04 LAB — MAGNESIUM: Magnesium: 2.7 mg/dL — ABNORMAL HIGH (ref 1.7–2.4)

## 2016-01-04 SURGERY — REPAIR, MITRAL VALVE, MINIMALLY INVASIVE
Anesthesia: General | Site: Chest | Laterality: Right

## 2016-01-04 MED ORDER — LACTATED RINGERS IV SOLN: 500.0000 mL | Freq: Once | INTRAVENOUS | Status: DC | PRN

## 2016-01-04 MED ORDER — CHLORHEXIDINE GLUCONATE 4 % EX LIQD: 30.0000 mL | CUTANEOUS | Status: DC

## 2016-01-04 MED ORDER — ROCURONIUM BROMIDE 50 MG/5ML IV SOLN
INTRAVENOUS | Status: AC
  Filled 2016-01-04: qty 3

## 2016-01-04 MED ORDER — METOPROLOL TARTRATE 25 MG/10 ML ORAL SUSPENSION
12.5000 mg | Freq: Two times a day (BID) | ORAL | Status: DC
Start: 2016-01-04 — End: 2016-01-05

## 2016-01-04 MED ORDER — MAGNESIUM SULFATE 4 GM/100ML IV SOLN
4.0000 g | Freq: Once | INTRAVENOUS | Status: AC
  Administered 2016-01-04: 4 g via INTRAVENOUS
  Filled 2016-01-04: qty 100

## 2016-01-04 MED ORDER — VANCOMYCIN HCL IN DEXTROSE 1-5 GM/200ML-% IV SOLN
1000.0000 mg | Freq: Once | INTRAVENOUS | Status: AC
  Administered 2016-01-04: 1000 mg via INTRAVENOUS
  Filled 2016-01-04: qty 200

## 2016-01-04 MED ORDER — PROPOFOL 10 MG/ML IV BOLUS
INTRAVENOUS | Status: AC
  Filled 2016-01-04: qty 20

## 2016-01-04 MED ORDER — DEXMEDETOMIDINE HCL IN NACL 400 MCG/100ML IV SOLN
0.1000 ug/kg/h | INTRAVENOUS | Status: DC
Start: 2016-01-04 — End: 2016-01-04
  Filled 2016-01-04: qty 100

## 2016-01-04 MED ORDER — LACTATED RINGERS IV SOLN
INTRAVENOUS | Status: DC | PRN
  Administered 2016-01-04: 08:00:00 via INTRAVENOUS

## 2016-01-04 MED ORDER — MORPHINE SULFATE (PF) 2 MG/ML IV SOLN: 2.0000 mg | INTRAVENOUS | Status: DC | PRN

## 2016-01-04 MED ORDER — BISACODYL 10 MG RE SUPP
10.0000 mg | Freq: Every day | RECTAL | Status: DC
Start: 2016-01-05 — End: 2016-01-10

## 2016-01-04 MED ORDER — METOPROLOL TARTRATE 12.5 MG HALF TABLET: 12.5000 mg | ORAL_TABLET | Freq: Two times a day (BID) | ORAL | Status: DC

## 2016-01-04 MED ORDER — MIDAZOLAM HCL 2 MG/2ML IJ SOLN: 2.0000 mg | INTRAMUSCULAR | Status: DC | PRN

## 2016-01-04 MED ORDER — MIDAZOLAM HCL 5 MG/5ML IJ SOLN
INTRAMUSCULAR | Status: DC | PRN
  Administered 2016-01-04: 4 mg via INTRAVENOUS
  Administered 2016-01-04: 6 mg via INTRAVENOUS

## 2016-01-04 MED ORDER — SODIUM CHLORIDE 0.9 % IV SOLN
INTRAVENOUS | Status: DC | PRN
  Administered 2016-01-04: 15:00:00 via INTRAVENOUS

## 2016-01-04 MED ORDER — SODIUM CHLORIDE 0.9% FLUSH
3.0000 mL | Freq: Two times a day (BID) | INTRAVENOUS | Status: DC
  Administered 2016-01-05 – 2016-01-10 (×10): 3 mL via INTRAVENOUS

## 2016-01-04 MED ORDER — SODIUM CHLORIDE 0.9 % IV SOLN
10.0000 g | INTRAVENOUS | Status: DC | PRN
  Administered 2016-01-04: 5 g/h via INTRAVENOUS

## 2016-01-04 MED ORDER — ACETAMINOPHEN 160 MG/5ML PO SOLN: 650.0000 mg | Freq: Once | ORAL | Status: AC

## 2016-01-04 MED ORDER — SODIUM CHLORIDE 0.9 % IV SOLN
INTRAVENOUS | Status: DC
  Administered 2016-01-04: 100 mL/h via INTRAVENOUS

## 2016-01-04 MED ORDER — SODIUM CHLORIDE 0.9% FLUSH: 3.0000 mL | INTRAVENOUS | Status: DC | PRN

## 2016-01-04 MED ORDER — NITROGLYCERIN IN D5W 200-5 MCG/ML-% IV SOLN: 0.0000 ug/min | INTRAVENOUS | Status: DC

## 2016-01-04 MED ORDER — AMIODARONE HCL IN DEXTROSE 360-4.14 MG/200ML-% IV SOLN
30.0000 mg/h | INTRAVENOUS | Status: DC
  Administered 2016-01-04 – 2016-01-05 (×2): 30 mg/h via INTRAVENOUS
  Filled 2016-01-04 (×2): qty 200

## 2016-01-04 MED ORDER — SODIUM CHLORIDE 0.9 % IJ SOLN
INTRAMUSCULAR | Status: AC
Start: 2016-01-04 — End: 2016-01-04
  Filled 2016-01-04: qty 10

## 2016-01-04 MED ORDER — DEXTROSE 5 % IV SOLN
10.0000 mg | INTRAVENOUS | Status: DC | PRN
  Administered 2016-01-04: 30 ug/min via INTRAVENOUS

## 2016-01-04 MED ORDER — INSULIN ASPART 100 UNIT/ML ~~LOC~~ SOLN
0.0000 [IU] | SUBCUTANEOUS | Status: DC
  Administered 2016-01-05 (×5): 2 [IU] via SUBCUTANEOUS

## 2016-01-04 MED ORDER — METOPROLOL TARTRATE 1 MG/ML IV SOLN: 2.5000 mg | INTRAVENOUS | Status: DC | PRN

## 2016-01-04 MED ORDER — HEPARIN SODIUM (PORCINE) 1000 UNIT/ML IJ SOLN
INTRAMUSCULAR | Status: DC | PRN
  Administered 2016-01-04: 25 mL via INTRAVENOUS

## 2016-01-04 MED ORDER — BUPIVACAINE HCL (PF) 0.5 % IJ SOLN
INTRAMUSCULAR | Status: DC | PRN
  Administered 2016-01-04: 10 mL

## 2016-01-04 MED ORDER — PROTAMINE SULFATE 10 MG/ML IV SOLN
INTRAVENOUS | Status: DC | PRN
  Administered 2016-01-04: 50 mg via INTRAVENOUS
  Administered 2016-01-04: 5 mg via INTRAVENOUS
  Administered 2016-01-04: 70 mg via INTRAVENOUS
  Administered 2016-01-04: 50 mg via INTRAVENOUS
  Administered 2016-01-04: 75 mg via INTRAVENOUS

## 2016-01-04 MED ORDER — AMIODARONE HCL IN DEXTROSE 360-4.14 MG/200ML-% IV SOLN
60.0000 mg/h | INTRAVENOUS | Status: AC
  Administered 2016-01-04: 150 mg/h via INTRAVENOUS
  Filled 2016-01-04: qty 200

## 2016-01-04 MED ORDER — ASPIRIN EC 325 MG PO TBEC
325.0000 mg | DELAYED_RELEASE_TABLET | Freq: Every day | ORAL | Status: DC
Start: 2016-01-05 — End: 2016-01-06
  Administered 2016-01-05: 325 mg via ORAL
  Filled 2016-01-04: qty 1

## 2016-01-04 MED ORDER — ACETAMINOPHEN 650 MG RE SUPP
650.0000 mg | Freq: Once | RECTAL | Status: AC
  Administered 2016-01-04: 650 mg via RECTAL

## 2016-01-04 MED ORDER — BUPIVACAINE HCL (PF) 0.5 % IJ SOLN
INTRAMUSCULAR | Status: AC
  Filled 2016-01-04: qty 10

## 2016-01-04 MED ORDER — CETYLPYRIDINIUM CHLORIDE 0.05 % MT LIQD
7.0000 mL | Freq: Two times a day (BID) | OROMUCOSAL | Status: DC
  Administered 2016-01-04 – 2016-01-10 (×6): 7 mL via OROMUCOSAL

## 2016-01-04 MED ORDER — TRAMADOL HCL 50 MG PO TABS
50.0000 mg | ORAL_TABLET | ORAL | Status: DC | PRN
  Administered 2016-01-08: 50 mg via ORAL
  Filled 2016-01-04: qty 2
  Filled 2016-01-04: qty 1

## 2016-01-04 MED ORDER — DEXTROSE 50 % IV SOLN: 1.0000 | Freq: Once | INTRAVENOUS | Status: DC

## 2016-01-04 MED ORDER — ROCURONIUM BROMIDE 50 MG/5ML IV SOLN
INTRAVENOUS | Status: AC
  Filled 2016-01-04: qty 1

## 2016-01-04 MED ORDER — DEXMEDETOMIDINE HCL IN NACL 200 MCG/50ML IV SOLN
0.0000 ug/kg/h | INTRAVENOUS | Status: DC
  Administered 2016-01-04: 0.7 ug/kg/h via INTRAVENOUS

## 2016-01-04 MED ORDER — BISACODYL 5 MG PO TBEC
10.0000 mg | DELAYED_RELEASE_TABLET | Freq: Every day | ORAL | Status: DC
  Administered 2016-01-05 – 2016-01-09 (×5): 10 mg via ORAL
  Filled 2016-01-04 (×5): qty 2

## 2016-01-04 MED ORDER — CEFUROXIME SODIUM 1.5 G IJ SOLR
1.5000 g | Freq: Two times a day (BID) | INTRAMUSCULAR | Status: AC
  Administered 2016-01-04 – 2016-01-06 (×4): 1.5 g via INTRAVENOUS
  Filled 2016-01-04 (×4): qty 1.5

## 2016-01-04 MED ORDER — CHLORHEXIDINE GLUCONATE 0.12 % MT SOLN
15.0000 mL | OROMUCOSAL | Status: AC
  Administered 2016-01-04: 15 mL via OROMUCOSAL
  Filled 2016-01-04: qty 15

## 2016-01-04 MED ORDER — FAMOTIDINE IN NACL 20-0.9 MG/50ML-% IV SOLN
20.0000 mg | Freq: Two times a day (BID) | INTRAVENOUS | Status: DC
  Administered 2016-01-04: 20 mg via INTRAVENOUS

## 2016-01-04 MED ORDER — SODIUM CHLORIDE 0.9 % IV SOLN
INTRAVENOUS | Status: DC
  Administered 2016-01-04: 20 mL/h via INTRAVENOUS

## 2016-01-04 MED ORDER — SODIUM CHLORIDE 0.9 % IJ SOLN
INTRAMUSCULAR | Status: AC
  Filled 2016-01-04: qty 10

## 2016-01-04 MED ORDER — PROPOFOL 10 MG/ML IV BOLUS
INTRAVENOUS | Status: DC | PRN
  Administered 2016-01-04: 20 mg via INTRAVENOUS

## 2016-01-04 MED ORDER — SODIUM CHLORIDE 0.9 % IV SOLN
250.0000 [IU] | INTRAVENOUS | Status: DC | PRN
  Administered 2016-01-04: 1 [IU]/h via INTRAVENOUS

## 2016-01-04 MED ORDER — ANTISEPTIC ORAL RINSE SOLUTION (CORINZ): 7.0000 mL | OROMUCOSAL | Status: DC

## 2016-01-04 MED ORDER — ALBUMIN HUMAN 5 % IV SOLN
250.0000 mL | INTRAVENOUS | Status: AC | PRN
Start: 2016-01-04 — End: 2016-01-05
  Administered 2016-01-04 – 2016-01-05 (×4): 250 mL via INTRAVENOUS
  Filled 2016-01-04: qty 250

## 2016-01-04 MED ORDER — ACETAMINOPHEN 160 MG/5ML PO SOLN: 1000.0000 mg | Freq: Four times a day (QID) | ORAL | Status: DC

## 2016-01-04 MED ORDER — SODIUM CHLORIDE 0.9 % IV SOLN: 250.0000 mL | INTRAVENOUS | Status: DC

## 2016-01-04 MED ORDER — FENTANYL CITRATE (PF) 250 MCG/5ML IJ SOLN
INTRAMUSCULAR | Status: AC
  Filled 2016-01-04: qty 5

## 2016-01-04 MED ORDER — BUPIVACAINE 0.5 % ON-Q PUMP SINGLE CATH 400 ML
400.0000 mL | INJECTION | Status: DC
  Filled 2016-01-04: qty 400

## 2016-01-04 MED ORDER — FENTANYL CITRATE (PF) 250 MCG/5ML IJ SOLN
INTRAMUSCULAR | Status: AC
  Filled 2016-01-04: qty 20

## 2016-01-04 MED ORDER — POTASSIUM CHLORIDE 10 MEQ/50ML IV SOLN
10.0000 meq | INTRAVENOUS | Status: AC
  Administered 2016-01-04 (×3): 10 meq via INTRAVENOUS

## 2016-01-04 MED ORDER — FENTANYL CITRATE (PF) 100 MCG/2ML IJ SOLN
INTRAMUSCULAR | Status: DC | PRN
  Administered 2016-01-04: 150 ug via INTRAVENOUS
  Administered 2016-01-04: 100 ug via INTRAVENOUS
  Administered 2016-01-04: 200 ug via INTRAVENOUS
  Administered 2016-01-04: 150 ug via INTRAVENOUS
  Administered 2016-01-04: 50 ug via INTRAVENOUS
  Administered 2016-01-04: 100 ug via INTRAVENOUS
  Administered 2016-01-04: 500 ug via INTRAVENOUS

## 2016-01-04 MED ORDER — DEXTROSE 50 % IV SOLN
INTRAVENOUS | Status: AC
  Filled 2016-01-04: qty 50

## 2016-01-04 MED ORDER — ESMOLOL HCL 100 MG/10ML IV SOLN
INTRAVENOUS | Status: AC
Start: 2016-01-04 — End: 2016-01-04
  Filled 2016-01-04: qty 10

## 2016-01-04 MED ORDER — LACTATED RINGERS IV SOLN
INTRAVENOUS | Status: DC
Start: 2016-01-04 — End: 2016-01-05
  Administered 2016-01-04: 20 mL/h via INTRAVENOUS

## 2016-01-04 MED ORDER — PANTOPRAZOLE SODIUM 40 MG PO TBEC
40.0000 mg | DELAYED_RELEASE_TABLET | Freq: Every day | ORAL | Status: DC
  Administered 2016-01-06 – 2016-01-10 (×5): 40 mg via ORAL
  Filled 2016-01-04 (×5): qty 1

## 2016-01-04 MED ORDER — INSULIN REGULAR BOLUS VIA INFUSION
0.0000 [IU] | Freq: Three times a day (TID) | INTRAVENOUS | Status: DC
  Filled 2016-01-04: qty 10

## 2016-01-04 MED ORDER — CHLORHEXIDINE GLUCONATE 0.12% ORAL RINSE (MEDLINE KIT)
15.0000 mL | Freq: Two times a day (BID) | OROMUCOSAL | Status: DC
  Administered 2016-01-04: 15 mL via OROMUCOSAL

## 2016-01-04 MED ORDER — PHENYLEPHRINE HCL 10 MG/ML IJ SOLN
0.0000 ug/min | INTRAVENOUS | Status: DC
  Administered 2016-01-05: 10 ug/min via INTRAVENOUS
  Filled 2016-01-04: qty 2

## 2016-01-04 MED ORDER — SODIUM CHLORIDE 0.9 % IV SOLN
200.0000 ug | INTRAVENOUS | Status: DC | PRN
  Administered 2016-01-04: .2 ug/kg/h via INTRAVENOUS

## 2016-01-04 MED ORDER — MIDAZOLAM HCL 10 MG/2ML IJ SOLN
INTRAMUSCULAR | Status: AC
  Filled 2016-01-04: qty 2

## 2016-01-04 MED ORDER — ROCURONIUM BROMIDE 100 MG/10ML IV SOLN
INTRAVENOUS | Status: DC | PRN
  Administered 2016-01-04: 30 mg via INTRAVENOUS
  Administered 2016-01-04: 20 mg via INTRAVENOUS
  Administered 2016-01-04 (×4): 50 mg via INTRAVENOUS

## 2016-01-04 MED ORDER — SODIUM CHLORIDE 0.9 % IV SOLN
0.5000 g/h | Freq: Once | INTRAVENOUS | Status: DC
  Filled 2016-01-04: qty 20

## 2016-01-04 MED ORDER — HEMOSTATIC AGENTS (NO CHARGE) OPTIME
TOPICAL | Status: DC | PRN
  Administered 2016-01-04: 1 via TOPICAL

## 2016-01-04 MED ORDER — ACETAMINOPHEN 500 MG PO TABS
1000.0000 mg | ORAL_TABLET | Freq: Four times a day (QID) | ORAL | Status: AC
  Administered 2016-01-04 – 2016-01-08 (×6): 1000 mg via ORAL
  Filled 2016-01-04 (×10): qty 2

## 2016-01-04 MED ORDER — EPHEDRINE SULFATE 50 MG/ML IJ SOLN
INTRAMUSCULAR | Status: DC | PRN
  Administered 2016-01-04: 10 mg via INTRAVENOUS

## 2016-01-04 MED ORDER — ASPIRIN 81 MG PO CHEW: 324.0000 mg | CHEWABLE_TABLET | Freq: Every day | ORAL | Status: DC

## 2016-01-04 MED ORDER — 0.9 % SODIUM CHLORIDE (POUR BTL) OPTIME
TOPICAL | Status: DC | PRN
  Administered 2016-01-04: 5000 mL

## 2016-01-04 MED ORDER — DOCUSATE SODIUM 100 MG PO CAPS
200.0000 mg | ORAL_CAPSULE | Freq: Every day | ORAL | Status: DC
  Administered 2016-01-05 – 2016-01-09 (×5): 200 mg via ORAL
  Filled 2016-01-04 (×5): qty 2

## 2016-01-04 MED ORDER — LACTATED RINGERS IV SOLN
INTRAVENOUS | Status: DC
  Administered 2016-01-04: 10 mL/h via INTRAVENOUS

## 2016-01-04 MED ORDER — OXYCODONE HCL 5 MG PO TABS
5.0000 mg | ORAL_TABLET | ORAL | Status: DC | PRN
Start: 2016-01-04 — End: 2016-01-10
  Administered 2016-01-05: 5 mg via ORAL
  Administered 2016-01-05: 10 mg via ORAL
  Filled 2016-01-04: qty 1
  Filled 2016-01-04: qty 2

## 2016-01-04 MED ORDER — SODIUM CHLORIDE 0.45 % IV SOLN
INTRAVENOUS | Status: DC | PRN
  Administered 2016-01-04: 20 mL/h via INTRAVENOUS

## 2016-01-04 MED ORDER — ONDANSETRON HCL 4 MG/2ML IJ SOLN
4.0000 mg | Freq: Four times a day (QID) | INTRAMUSCULAR | Status: DC | PRN
  Administered 2016-01-05: 4 mg via INTRAVENOUS
  Filled 2016-01-04: qty 2

## 2016-01-04 MED ORDER — ARTIFICIAL TEARS OP OINT
TOPICAL_OINTMENT | OPHTHALMIC | Status: AC
  Filled 2016-01-04: qty 3.5

## 2016-01-04 MED ORDER — LACTATED RINGERS IV SOLN
INTRAVENOUS | Status: DC | PRN
  Administered 2016-01-04 (×2): via INTRAVENOUS

## 2016-01-04 MED ORDER — MORPHINE SULFATE (PF) 2 MG/ML IV SOLN: 1.0000 mg | INTRAVENOUS | Status: DC | PRN

## 2016-01-04 MED ORDER — SODIUM CHLORIDE 0.9 % IV SOLN
INTRAVENOUS | Status: DC
  Filled 2016-01-04: qty 2.5

## 2016-01-04 MED ORDER — DEXTROSE 50 % IV SOLN
1.0000 | Freq: Once | INTRAVENOUS | Status: AC
  Administered 2016-01-04: 50 mL via INTRAVENOUS

## 2016-01-04 MED FILL — Magnesium Sulfate Inj 50%: INTRAMUSCULAR | Qty: 10 | Status: AC

## 2016-01-04 MED FILL — Heparin Sodium (Porcine) Inj 1000 Unit/ML: INTRAMUSCULAR | Qty: 30 | Status: AC

## 2016-01-04 MED FILL — Potassium Chloride Inj 2 mEq/ML: INTRAVENOUS | Qty: 40 | Status: AC

## 2016-01-04 SURGICAL SUPPLY — 115 items
ADAPTER CARDIO PERF ANTE/RETRO (ADAPTER) ×4 IMPLANT
ARTICLIP LAA PROCLIP II 45 (Clip) ×4 IMPLANT
BAG DECANTER FOR FLEXI CONT (MISCELLANEOUS) ×8 IMPLANT
BLADE SURG 11 STRL SS (BLADE) ×4 IMPLANT
CANISTER SUCTION 2500CC (MISCELLANEOUS) ×8 IMPLANT
CANNULA FEM VENOUS REMOTE 22FR (CANNULA) ×4 IMPLANT
CANNULA FEMORAL ART 14 SM (MISCELLANEOUS) ×4 IMPLANT
CANNULA GUNDRY RCSP 15FR (MISCELLANEOUS) ×4 IMPLANT
CANNULA OPTISITE PERFUSION 16F (CANNULA) IMPLANT
CANNULA OPTISITE PERFUSION 18F (CANNULA) ×4 IMPLANT
CANNULA SUMP PERICARDIAL (CANNULA) ×8 IMPLANT
CATH KIT ON Q 5IN SLV (PAIN MANAGEMENT) ×4 IMPLANT
CONN ST 1/4X3/8  BEN (MISCELLANEOUS) ×2
CONN ST 1/4X3/8 BEN (MISCELLANEOUS) ×6 IMPLANT
CONNECTOR 1/2X3/8X1/2 3 WAY (MISCELLANEOUS) ×1
CONNECTOR 1/2X3/8X1/2 3WAY (MISCELLANEOUS) ×3 IMPLANT
CONT SPEC 4OZ CLIKSEAL STRL BL (MISCELLANEOUS) ×4 IMPLANT
CONT SPEC STER OR (MISCELLANEOUS) ×4 IMPLANT
COVER BACK TABLE 24X17X13 BIG (DRAPES) ×4 IMPLANT
COVER PROBE W GEL 5X96 (DRAPES) ×4 IMPLANT
CRADLE DONUT ADULT HEAD (MISCELLANEOUS) ×4 IMPLANT
DERMABOND ADVANCED (GAUZE/BANDAGES/DRESSINGS) ×2
DERMABOND ADVANCED .7 DNX12 (GAUZE/BANDAGES/DRESSINGS) ×6 IMPLANT
DEVICE ATRICLIP LAA PRCLPII 45 (Clip) ×3 IMPLANT
DEVICE PMI PUNCTURE CLOSURE (MISCELLANEOUS) ×4 IMPLANT
DEVICE SUT CK QUICK LOAD MINI (Prosthesis & Implant Heart) ×8 IMPLANT
DEVICE TROCAR PUNCTURE CLOSURE (ENDOMECHANICALS) ×4 IMPLANT
DRAIN CHANNEL 28F RND 3/8 FF (WOUND CARE) ×8 IMPLANT
DRAPE BILATERAL SPLIT (DRAPES) ×4 IMPLANT
DRAPE C-ARM 42X72 X-RAY (DRAPES) ×4 IMPLANT
DRAPE CV SPLIT W-CLR ANES SCRN (DRAPES) ×4 IMPLANT
DRAPE INCISE IOBAN 66X45 STRL (DRAPES) ×12 IMPLANT
DRAPE SLUSH/WARMER DISC (DRAPES) ×4 IMPLANT
DRSG COVADERM 4X8 (GAUZE/BANDAGES/DRESSINGS) ×4 IMPLANT
ELECT BLADE 6.5 EXT (BLADE) ×8 IMPLANT
ELECT REM PT RETURN 9FT ADLT (ELECTROSURGICAL) ×8
ELECTRODE REM PT RTRN 9FT ADLT (ELECTROSURGICAL) ×6 IMPLANT
FELT TEFLON 1X6 (MISCELLANEOUS) ×4 IMPLANT
FEMORAL VENOUS CANN RAP (CANNULA) IMPLANT
GLOVE BIO SURGEON STRL SZ 6.5 (GLOVE) ×24 IMPLANT
GLOVE BIO SURGEON STRL SZ7 (GLOVE) ×16 IMPLANT
GLOVE ORTHO TXT STRL SZ7.5 (GLOVE) ×12 IMPLANT
GOWN STRL REUS W/ TWL LRG LVL3 (GOWN DISPOSABLE) ×12 IMPLANT
GOWN STRL REUS W/TWL LRG LVL3 (GOWN DISPOSABLE) ×4
IV NS IRRIG 3000ML ARTHROMATIC (IV SOLUTION) ×4 IMPLANT
KIT BASIN OR (CUSTOM PROCEDURE TRAY) ×4 IMPLANT
KIT DILATOR VASC 18G NDL (KITS) ×4 IMPLANT
KIT DRAINAGE VACCUM ASSIST (KITS) ×4 IMPLANT
KIT ROOM TURNOVER OR (KITS) ×4 IMPLANT
KIT SUCTION CATH 14FR (SUCTIONS) ×4 IMPLANT
KIT SUT CK MINI COMBO 4X17 (Prosthesis & Implant Heart) ×4 IMPLANT
LEAD PACING MYOCARDI (MISCELLANEOUS) ×4 IMPLANT
LINE VENT (MISCELLANEOUS) ×4 IMPLANT
NEEDLE AORTIC ROOT 14G 7F (CATHETERS) ×4 IMPLANT
NS IRRIG 1000ML POUR BTL (IV SOLUTION) ×20 IMPLANT
PACK OPEN HEART (CUSTOM PROCEDURE TRAY) ×4 IMPLANT
PAD ARMBOARD 7.5X6 YLW CONV (MISCELLANEOUS) ×8 IMPLANT
PAD ELECT DEFIB RADIOL ZOLL (MISCELLANEOUS) ×4 IMPLANT
PATCH CORMATRIX 4CMX7CM (Prosthesis & Implant Heart) ×4 IMPLANT
RETRACTOR TRL SOFT TISSUE LG (INSTRUMENTS) IMPLANT
RETRACTOR TRM SOFT TISSUE 7.5 (INSTRUMENTS) IMPLANT
RING MITRAL MEMO 3D 32MM SMD32 (Prosthesis & Implant Heart) ×4 IMPLANT
SET CANNULATION TOURNIQUET (MISCELLANEOUS) ×4 IMPLANT
SET CARDIOPLEGIA MPS 5001102 (MISCELLANEOUS) ×4 IMPLANT
SET IRRIG TUBING LAPAROSCOPIC (IRRIGATION / IRRIGATOR) ×4 IMPLANT
SOLUTION ANTI FOG 6CC (MISCELLANEOUS) ×4 IMPLANT
SPONGE GAUZE 4X4 12PLY STER LF (GAUZE/BANDAGES/DRESSINGS) ×4 IMPLANT
SUT BONE WAX W31G (SUTURE) ×4 IMPLANT
SUT E-PACK MINIMALLY INVASIVE (SUTURE) IMPLANT
SUT ETHIBOND (SUTURE) ×12 IMPLANT
SUT ETHIBOND 2 0 SH (SUTURE) ×4 IMPLANT
SUT ETHIBOND 2-0 RB-1 WHT (SUTURE) ×12 IMPLANT
SUT ETHIBOND NAB MH 2-0 36IN (SUTURE) ×4 IMPLANT
SUT ETHIBOND X763 2 0 SH 1 (SUTURE) ×4 IMPLANT
SUT GORETEX CV 4 TH 22 36 (SUTURE) ×4 IMPLANT
SUT GORETEX CV-5THC-13 36IN (SUTURE) ×28 IMPLANT
SUT GORETEX CV4 TH-18 (SUTURE) ×8 IMPLANT
SUT MNCRL AB 3-0 PS2 18 (SUTURE) ×8 IMPLANT
SUT PROLENE 3 0 SH 1 (SUTURE) ×16 IMPLANT
SUT PROLENE 3 0 SH DA (SUTURE) ×20 IMPLANT
SUT PROLENE 3 0 SH1 36 (SUTURE) ×24 IMPLANT
SUT PROLENE 4 0 RB 1 (SUTURE) ×2
SUT PROLENE 4-0 RB1 .5 CRCL 36 (SUTURE) ×6 IMPLANT
SUT PROLENE 5 0 C 1 36 (SUTURE) ×8 IMPLANT
SUT PROLENE 6 0 C 1 30 (SUTURE) ×8 IMPLANT
SUT PTFE CHORD X 24MM (SUTURE) ×4 IMPLANT
SUT SILK  1 MH (SUTURE) ×3
SUT SILK 1 MH (SUTURE) ×9 IMPLANT
SUT SILK 1 TIES 10X30 (SUTURE) ×4 IMPLANT
SUT SILK 2 0 SH CR/8 (SUTURE) ×4 IMPLANT
SUT SILK 2 0 TIES 10X30 (SUTURE) ×4 IMPLANT
SUT SILK 2 0SH CR/8 30 (SUTURE) ×8 IMPLANT
SUT SILK 3 0 (SUTURE) ×1
SUT SILK 3 0 SH CR/8 (SUTURE) ×4 IMPLANT
SUT SILK 3 0SH CR/8 30 (SUTURE) ×28 IMPLANT
SUT SILK 3-0 18XBRD TIE 12 (SUTURE) ×3 IMPLANT
SUT TEM PAC WIRE 2 0 SH (SUTURE) ×8 IMPLANT
SUT VIC AB 2-0 CTX 36 (SUTURE) ×8 IMPLANT
SUT VIC AB 2-0 UR6 27 (SUTURE) ×8 IMPLANT
SUT VIC AB 3-0 SH 8-18 (SUTURE) ×16 IMPLANT
SUT VICRYL 2 TP 1 (SUTURE) ×4 IMPLANT
SYRINGE 10CC LL (SYRINGE) ×4 IMPLANT
SYSTEM SAHARA CHEST DRAIN ATS (WOUND CARE) ×4 IMPLANT
TAPE CLOTH SURG 4X10 WHT LF (GAUZE/BANDAGES/DRESSINGS) ×4 IMPLANT
TAPE PAPER 2X10 WHT MICROPORE (GAUZE/BANDAGES/DRESSINGS) ×4 IMPLANT
TOWEL OR 17X24 6PK STRL BLUE (TOWEL DISPOSABLE) ×8 IMPLANT
TOWEL OR 17X26 10 PK STRL BLUE (TOWEL DISPOSABLE) ×8 IMPLANT
TRAY FOLEY IC TEMP SENS 16FR (CATHETERS) ×4 IMPLANT
TROCAR XCEL BLADELESS 5X75MML (TROCAR) ×4 IMPLANT
TROCAR XCEL NON-BLD 11X100MML (ENDOMECHANICALS) ×8 IMPLANT
TUBE SUCT INTRACARD DLP 20F (MISCELLANEOUS) ×4 IMPLANT
TUNNELER SHEATH ON-Q 11GX8 DSP (PAIN MANAGEMENT) ×4 IMPLANT
UNDERPAD 30X30 INCONTINENT (UNDERPADS AND DIAPERS) ×4 IMPLANT
WATER STERILE IRR 1000ML POUR (IV SOLUTION) ×8 IMPLANT
WIRE J 3MM .035X145CM (WIRE) ×4 IMPLANT

## 2016-01-04 NOTE — Progress Notes (Signed)
Patient ID: Etheridge Whitmarsh, male   DOB: [birth year], 82 y.o.   MRN: [id number] EVENING ROUNDS NOTE :     [address]       [place] [address]             [phone]                 Day of Surgery Procedure(s) (LRB): MINIMALLY INVASIVE MITRAL VALVE REPAIR (MVR) (Right) TRANSESOPHAGEAL ECHOCARDIOGRAM (TEE) (N/A) PATENT FORAMEN OVALE CLOSURE (N/A) CLIPPING OF ATRIAL APPENDAGE (N/A)  Total Length of Stay:  LOS: 0 days  BP 147/69 mmHg  Pulse 80  Temp(Src) 96.6 F (35.9 C) (Oral)  Resp 13  Wt 167 lb (75.751 kg)  SpO2 100%  .Intake/Output      03/15 0701 - 03/16 0700   I.V. (mL/kg) 3722.4 (49.1)   Blood 427   NG/GT 30   IV Piggyback 800   Total Intake(mL/kg) 4979.4 (65.7)   Urine (mL/kg/hr) 1345 (1.5)   Blood 1250 (1.4)   Chest Tube 175 (0.2)   Total Output 2770   Net +2209.4         . sodium chloride 20 mL/hr (01/04/16 1540)  . [START ON 01/05/2016] sodium chloride    . sodium chloride 20 mL/hr (01/04/16 1540)  . sodium chloride 100 mL/hr (01/04/16 1619)  . amiodarone 30 mg/hr (01/04/16 1802)  . dexmedetomidine 0.5 mcg/kg/hr (01/04/16 1825)  . insulin (NOVOLIN-R) infusion Stopped (01/04/16 1800)  . lactated ringers 20 mL/hr (01/04/16 1540)  . lactated ringers 10 mL/hr (01/04/16 1540)  . nitroGLYCERIN Stopped (01/04/16 1540)  . phenylephrine (NEO-SYNEPHRINE) Adult infusion Stopped (01/04/16 1845)     Lab Results  Component Value Date   WBC 8.2 01/04/2016   HGB 11.2* 01/04/2016   HCT 33.0* 01/04/2016   PLT 122* 01/04/2016   GLUCOSE 130* 01/04/2016   CHOL 164 10/25/2015   TRIG 75 10/25/2015   HDL 67 10/25/2015   LDLCALC 69 10/25/2015   ALT 23 01/02/2016   AST 27 01/02/2016   NA 140 01/04/2016   K 3.7 01/04/2016   CL 111 01/04/2016   CREATININE 0.90 01/04/2016   BUN 17 01/04/2016   CO2 24 01/02/2016   TSH 6.01* 10/25/2015   INR 1.55* 01/04/2016   HGBA1C 5.5 01/02/2016   Early postop, not bleeding, still on vent  Grace Isaac  MD  Beeper [phone] Office [phone] 01/04/2016 7:09 PM

## 2016-01-04 NOTE — Procedures (Signed)
Extubation Procedure Note  Patient Details:   Name: Samuel [name] DOB: 03/21/1932 MRN: [id number]   Airway Documentation:     Evaluation  O2 sats: stable throughout Complications: No apparent complications Patient did tolerate procedure well. Bilateral Breath Sounds: Clear, Diminished Suctioning: Oral, Airway Yes   Passed rapid wean protocol.  ABG within protocol limits.  Leak test positive.  Extubated to 5L  without complication.  Able to cough and speak.  IS 1000-1249ml post extubation.    Rayne Du Marga Gramajo 01/04/2016, 10:05 PM

## 2016-01-04 NOTE — Interval H&P Note (Signed)
History and Physical Interval Note:  01/04/2016 7:29 AM  Samuel [name]  has presented today for surgery, with the diagnosis of MR  The various methods of treatment have been discussed with the patient and family. After consideration of risks, benefits and other options for treatment, the patient has consented to  Procedure(s): MINIMALLY INVASIVE MITRAL VALVE REPAIR (MVR) (Right) TRANSESOPHAGEAL ECHOCARDIOGRAM (TEE) (N/A) as a surgical intervention .  The patient's history has been reviewed, patient examined, no change in status, stable for surgery.  I have reviewed the patient's chart and labs.  Questions were answered to the patient's satisfaction.     Rexene Alberts

## 2016-01-04 NOTE — Transfer of Care (Signed)
Immediate Anesthesia Transfer of Care Note  Patient: Samuel [name]  Procedure(s) Performed: Procedure(s): MINIMALLY INVASIVE MITRAL VALVE REPAIR (MVR) (Right) TRANSESOPHAGEAL ECHOCARDIOGRAM (TEE) (N/A) PATENT FORAMEN OVALE CLOSURE (N/A) CLIPPING OF ATRIAL APPENDAGE (N/A)  Patient Location: SICU  Anesthesia Type:General  Level of Consciousness: sedated, unresponsive and Patient remains intubated per anesthesia plan  Airway & Oxygen Therapy: Patient remains intubated per anesthesia plan and Patient placed on Ventilator (see vital sign flow sheet for setting)  Post-op Assessment: Report given to RN and Post -op Vital signs reviewed and stable  Post vital signs: Reviewed and stable  Last Vitals:  Filed Vitals:   01/04/16 0654  BP: 147/69  Pulse: 61  Temp: 36.7 C  Resp: 20    Complications: No apparent anesthesia complications

## 2016-01-04 NOTE — Anesthesia Procedure Notes (Addendum)
Anesthesia Procedure Note Arterial Line: Timeout, sterile prep, drape R wrist, 1% lido local, #20ga AC radial artery 1st attempt, Biopatch and sterile dressing on, VSS, pt tolerated well  Jenita Seashore, MD  08:30-08:33   Procedure Name: Intubation Date/Time: 01/04/2016 8:58 AM Performed by: Trixie Deis A Pre-anesthesia Checklist: Timeout performed, Patient identified, Emergency Drugs available, Suction available and Patient being monitored Patient Re-evaluated:Patient Re-evaluated prior to inductionOxygen Delivery Method: Circle system utilized Preoxygenation: Pre-oxygenation with 100% oxygen Intubation Type: IV induction Ventilation: Mask ventilation without difficulty and Oral airway inserted - appropriate to patient size Grade View: Grade I Tube type: Oral Endobronchial tube: Double lumen EBT and Left and 37 Fr Number of attempts: 1 Airway Equipment and Method: Rigid stylet and Fiberoptic brochoscope Placement Confirmation: ETT inserted through vocal cords under direct vision,  breath sounds checked- equal and bilateral and positive ETCO2 Secured at: 31 cm Tube secured with: Tape Dental Injury: Teeth and Oropharynx as per pre-operative assessment    Anesthesia Procedure Note Arterial Line: Timeout, sterile prep, drape R wrist, 1% lido local, #20ga AC radial artery 1st attempt, Biopatch and sterile dressing on, VSS, pt tolerated well  Jenita Seashore, MD  08:30-08:33   Procedure Name: Intubation Date/Time: 01/04/2016 3:17 PM Performed by: Trixie Deis A Laryngoscope Size: Glidescope and 4 Grade View: Grade I Tube type: Oral Tube size: 8.0 mm Number of attempts: 1 Airway Equipment and Method: Video-laryngoscopy (Cook catheter ) Placement Confirmation: ETT inserted through vocal cords under direct vision,  positive ETCO2 and breath sounds checked- equal and bilateral Secured at: 22 cm Tube secured with: Tape Dental Injury: Teeth and Oropharynx as per pre-operative assessment  Comments:  Exchanged DLT for OETT without issue at end of surgical procedure for ICU care.

## 2016-01-04 NOTE — Progress Notes (Signed)
Patient tolerating PSV well.  Able to follow commands appropriately and lift head from pillow.  VC: 1.2 L NIF: -21

## 2016-01-04 NOTE — OR Nursing (Signed)
14:40 - 40 minute call to SICU

## 2016-01-04 NOTE — Anesthesia Postprocedure Evaluation (Signed)
Anesthesia Post Note  Patient: Samuel [name]  Procedure(s) Performed: Procedure(s) (LRB): MINIMALLY INVASIVE MITRAL VALVE REPAIR (MVR) (Right) TRANSESOPHAGEAL ECHOCARDIOGRAM (TEE) (N/A) PATENT FORAMEN OVALE CLOSURE (N/A) CLIPPING OF ATRIAL APPENDAGE (N/A)  Patient location during evaluation: SICU Anesthesia Type: General Level of consciousness: sedated and patient remains intubated per anesthesia plan Pain management: pain level controlled Vital Signs Assessment: post-procedure vital signs reviewed and stable Respiratory status: patient on ventilator - see flowsheet for VS and patient remains intubated per anesthesia plan Cardiovascular status: blood pressure returned to baseline and stable Anesthetic complications: no    Last Vitals:  Filed Vitals:   01/04/16 1630 01/04/16 1632  BP:    Pulse: 79 79  Temp: 35.9 C 35.8 C  Resp: 12 12    Last Pain: There were no vitals filed for this visit.               Midge Minium

## 2016-01-04 NOTE — Progress Notes (Signed)
*  PRELIMINARY RESULTS* Echocardiogram Echocardiogram Transesophageal has been performed.  Samuel [name] 01/04/2016, 9:45 AM

## 2016-01-04 NOTE — Anesthesia Preprocedure Evaluation (Addendum)
Anesthesia Evaluation  Patient identified by MRN, date of birth, ID band Patient awake    Reviewed: Allergy & Precautions, NPO status , Patient's Chart, lab work & pertinent test results  History of Anesthesia Complications Negative for: history of anesthetic complications  Airway Mallampati: II  TM Distance: >3 FB Neck ROM: Full    Dental  (+) Dental Advisory Given   Pulmonary former smoker (quit 1970),    breath sounds clear to auscultation       Cardiovascular hypertension, Pt. on medications and Pt. on home beta blockers (-) angina+ dysrhythmias Atrial Fibrillation + Valvular Problems/Murmurs (flail posterior leaflet with ruptured chords) MR  Rhythm:Irregular Rate:Normal  Normal LVF by echo and cath   Neuro/Psych negative neurological ROS     GI/Hepatic Neg liver ROS, GERD  Controlled,  Endo/Other  Hypothyroidism   Renal/GU negative Renal ROS     Musculoskeletal  (+) Arthritis ,   Abdominal   Peds  Hematology   Anesthesia Other Findings Prostate cancer  Reproductive/Obstetrics                            Anesthesia Physical Anesthesia Plan  ASA: III  Anesthesia Plan: General   Post-op Pain Management:    Induction: Intravenous  Airway Management Planned: Double Lumen EBT  Additional Equipment: Arterial line, CVP, PA Cath, TEE and Ultrasound Guidance Line Placement  Intra-op Plan:   Post-operative Plan: Post-operative intubation/ventilation  Informed Consent: I have reviewed the patients History and Physical, chart, labs and discussed the procedure including the risks, benefits and alternatives for the proposed anesthesia with the patient or authorized representative who has indicated his/her understanding and acceptance.   Dental advisory given  Plan Discussed with: CRNA and Surgeon  Anesthesia Plan Comments: (Plan routine monitors, A line, PA cath, GETA with DLT and post  op intubation)        Anesthesia Quick Evaluation

## 2016-01-04 NOTE — OR Nursing (Signed)
15:05-20 minute call to SICU charge nurse

## 2016-01-04 NOTE — Brief Op Note (Addendum)
01/04/2016  2:06 PM  PATIENT:  Samuel [name]  82 y.o. male  PRE-OPERATIVE DIAGNOSIS:  1. Severe Mitral Regurgitation  POST-OPERATIVE DIAGNOSIS:  1. Severe Mitral Regurgitation 2. 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  MV Stenosis: No mitral valve stenosis.  MV Disease Functional Class: MV Disease Functional Class: Type II.  Etiology (Choose at least one and up to five): Degenerative.  MV Lesions (Choose at least one): Elongated/ruptured chords(s).   Mitral/Tricuspid/Pulmonary Valve Procedure  Mitral Valve Procedure Performed:  Repair: Leaflet Resection. Resection Type:Triangular. Mitral Leaflet Resection Location: Posterior. and [place]rods. Number of Neochords Inserted: 3 pairs   Implant: Annuloplasty Device: Implant model number [id number], Size 32, Unique Device Identifier [id number].  Tricuspid Valve Procedure Performed:  N/A  Implant: N/A  Pulmonary Valve Procedure Performed: N/A  Implant: N/A       COMPLICATIONS: None  BASELINE WEIGHT: 76 kg  PATIENT DISPOSITION:   TO SICU IN STABLE CONDITION  Rexene Alberts, MD 01/04/2016 3:21 PM

## 2016-01-04 NOTE — Progress Notes (Signed)
UR Completed. Halley Kincer, RN, BSN.  [phone] 

## 2016-01-04 NOTE — Op Note (Signed)
CARDIOTHORACIC SURGERY OPERATIVE NOTE  Date of Procedure:  01/04/2016  Preoperative Diagnosis:   Severe Mitral Regurgitation  Postoperative Diagnosis:   Severe Mitral Regurgitation  Patent Foramen Ovale  Paroxysmal Atrial Fibrillation  Procedure:    Minimally-Invasive Mitral Valve Repair  Complex valvuloplasty including triangular resection of flail segment of posterior leaflet  Artificial Gore-tex neochord placement x6  Sorin Memo 3D Ring Annuloplasty (size 14mm, catalog # [id number], serial # [id number])   Clipping of Left Atrial Appendage   Closure of Patent Foramen Ovale    Surgeon: [name], MD  Assistant: Nani Skillern, PA-C  Anesthesia: Midge Minium, MD  Operative Findings:  Fibroelastic deficiency type degenerative disease with multiple ruptured chordae tendineaeflail segment of posterior leaflet  Type II dysfunction with severe mitral regurgitation  Normal LV systolic function  No residual mitral regurgitation after successful valve repair                      BRIEF CLINICAL NOTE AND INDICATIONS FOR SURGERY  Patient is an 82 year old male with history of hypertension and prostate cancer who has otherwise been remarkably healthy most of his life but has been referred for surgical consultation to discuss treatment options for recently discovered mitral valve prolapse with severe mitral regurgitation. The patient is retired from the [employer] and lived for many years in [place] where he was followed by a cardiologist because of history of heart murmur and abnormal electrocardiogram. He states that he has been told that he had a slight heart murmur for most of his life but this did not affect his ability to be in the [employer]. There is no reported history of rheumatic fever. Prior to elective surgery for radioactive seed implantation for management of the patient's prostate cancer the patient was noted to have abnormal EKG and referred  for cardiology evaluation. He carries the diagnosis of left atrial enlargement and paroxysmal atrial fibrillation. He states that the irregular heart rhythm was discovered only on a Holter monitor study that was performed after the patient suffered a brief syncopal event several years ago. Approximately 5 years ago the patient moved to [place] where he now lives with his wife at the [facility] [place] community. For the last several years he has been followed by Dr. Wynonia Lawman who recently noted a prominent systolic murmur on physical exam that was noticeably different in comparison with previous exams. Subsequent transthoracic echocardiogram revealed findings consistent with mitral valve prolapse with possible flail segment of the posterior leaflet and severe mitral regurgitation. The patient was referred for surgical consultation.  The patient has been seen in consultation and counseled at length regarding the indications, risks and potential benefits of surgery.  All questions have been answered, and the patient provides full informed consent for the operation as described.    DETAILS OF THE OPERATIVE PROCEDURE  Preparation:  The patient is brought to the operating room on the above mentioned date and central monitoring was established by the anesthesia team including placement of Swan-Ganz catheter through the left internal jugular vein.  A radial arterial line is placed. The patient is placed in the supine position on the operating table.  Intravenous antibiotics are administered. General endotracheal anesthesia is induced uneventfully. The patient is initially intubated using a dual lumen endotracheal tube.  A Foley catheter is placed.  Baseline transesophageal echocardiogram was performed.  Findings were notable for an obvious flail segment of the posterior leaflet with multiple ruptured chordae tendineae and severe mitral  regurgitation.  There was normal LV systolic function.  The aortic  valve was normal.  There was no aortic insufficiency.  There was normal RV size and systolic function with trivial tricuspid regurgitation.  A soft roll is placed behind the patient's left scapula and the neck gently extended and turned to the left.   The patient's right neck, chest, abdomen, both groins, and both lower extremities are prepared and draped in a sterile manner. A time out procedure is performed.  Surgical Approach:  A right miniature anterolateral thoracotomy incision is performed. The incision is placed just lateral to and superior to the right nipple. The pectoralis major muscle is retracted medially and completely preserved. The right pleural space is entered through the 4th intercostal space. A soft tissue retractor is placed.  Two 11 mm ports are placed through separate stab incisions inferiorly. The right pleural space is insufflated continuously with carbon dioxide gas through the posterior port during the remainder of the operation.  A pledgeted sutures placed through the dome of the right hemidiaphragm and retracted inferiorly to facilitate exposure.  A longitudinal incision is made in the pericardium 3 cm anterior to the phrenic nerve and silk traction sutures are placed on either side of the incision for exposure.   Extracorporeal Cardiopulmonary Bypass:  A small incision is made in the right inguinal crease and the anterior surface of the right common femoral artery and right common femoral vein are identified.  The patient is placed in Trendelenburg position. The right internal jugular vein is cannulated with Seldinger technique and a guidewire advanced into the right atrium. The patient is heparinized systemically. The right internal jugular vein is cannulated with a 14 [place] pediatric femoral venous cannula. Pursestring sutures are placed on the anterior surface of the right common femoral vein and right common femoral artery. The right common femoral vein is cannulated with  the Seldinger technique and a guidewire is advanced under transesophageal echocardiogram guidance through the right atrium. The femoral vein is cannulated with a long 22 French femoral venous cannula. The right common femoral artery is cannulated with Seldinger technique and a flexible guidewire is advanced until it can be appreciated intraluminally in the descending thoracic aorta on transesophageal echocardiogram. The femoral artery is cannulated with an 18 French femoral arterial cannula.  Adequate heparinization is verified.     The entire pre-bypass portion of the operation was notable for stable hemodynamics although the patient developed rapid atrial fibrillation.  Cardiopulmonary bypass was begun.  Vacuum assist venous drainage is utilized. The incision in the pericardium is extended in both directions. Venous drainage and exposure are notably excellent.    Clipping of Left Atrial Appendage:  The left atrial appendage is occluded using a left atrial clip (Atricure Atriclip Pro2, size 45 mm) applied through the transverse sinus posterior to the aorta.  TEE guidance was utilized to verify complete occlusion of the left atrial appendage.    Myocardial Protection:  A retrograde cardioplegia cannula is placed through the right atrium into the coronary sinus using transesophageal echocardiogram guidance.  An antegrade cardioplegia cannula is placed in the ascending aorta.    The patient is cooled to 28C systemic temperature.  The aortic cross clamp is applied and cold blood cardioplegia is delivered initially in an antegrade fashion through the aortic root.   Supplemental cardioplegia is given retrograde through the coronary sinus catheter. The initial cardioplegic arrest is rapid with early diastolic arrest.  Repeat doses of cardioplegia are administered intermittently every 20  to 30 minutes throughout the entire cross clamp portion of the operation through the aortic root and through the  coronary sinus catheter in order to maintain completely flat electrocardiogram.  Myocardial protection was felt to be excellent.   Closure of Patent Foramen Ovale:  A left atriotomy incision was performed through the interatrial groove and extended partially across the back wall of the left atrium after opening the oblique sinus inferiorly.  There was an obvious patent foramen ovale with blood shunting from right to left when vacuum assist venous drainage was temporarily stopped.  The patent foramen ovale was closed using running 4-0 Prolene suture.   Mitral Valve Repair:  The mitral valve is exposed using a self-retaining retractor.  The mitral valve was inspected and notable for fibroelastic deficiency type degenerative disease.  There was a flail segment of the posterior leaflet comprising a portion of P2 close to the cleft between P2 and P3.  There were multiple ruptured primary chordae tendineae .  Interrupted 2-0 Ethibond horizontal mattress sutures are placed circumferentially around the entire mitral valve annulus. The sutures will ultimately be utilized for ring annuloplasty, and at this juncture there are utilized to suspend the valve symmetrically.  The flail segment of the posterior leaflet is repaired using a simple triangular resection. The intervening vertical defect in the posterior leaflet is closed using interrupted simple everting CV-5 Gore-Tex sutures.  A Chord-X multistrand CV-4 Gore-Tex suture with premeasured loops is utilized to reimplant a total of 3 plan is repairs of artificial Gore-Tex neochords Onto the free margin of the posterior leaflet on either side of the vertical defect. The papillary muscle strand of this multistrand set of sutures is placed into the head of the posterior papillary muscle. Each pair is reimplanted directly into the free margin of P2.  The valve was tested with saline and appeared competent even without ring annuloplasty complete. The valve was  sized to a 32 mm annuloplasty ring, based upon the transverse distance between the left and right commissures and the height of the anterior leaflet, corresponding to a size just slightly larger than the overall surface area of the anterior leaflet.  A Sorin Memo 3D annuloplasty ring (size 48mm, catalog [id number], serial [id number]) was secured in place uneventfully. All ring sutures were secured using a Cor-knot device.    The valve is again tested with saline and appears to be perfectly competent with a broad symmetrical line of coaptation of the anterior and posterior leaflet. There is no residual leak. There was a broad, symmetrical line of coaptation of the anterior and posterior leaflet which was confirmed using the blue ink test.  Rewarming is begun.   Procedure Completion:  The atriotomy was closed using a 2-layer closure of running 3-0 Prolene suture after placing a sump drain across the mitral valve to serve as a left ventricular vent.  One final dose of warm retrograde "hot shot" cardioplegia was administered retrograde through the coronary sinus catheter while all air was evacuated through the aortic root.  The aortic cross clamp was removed after a total cross clamp time of 118 minutes.  Epicardial pacing wires are fixed to the inferior wall of the right ventricule and to the right atrial appendage. The patient is rewarmed to 37C temperature. The left ventricular vent is removed.  The patient is ventilated and flow volumes turndown while the mitral valve repair is inspected using transesophageal echocardiogram. The valve repair appears intact with no residual leak. The antegrade cardioplegia cannula is now  removed. The patient is weaned and disconnected from cardiopulmonary bypass.  The patient's rhythm at separation from bypass was sinus.  The patient was weaned from bypass without any inotropic support. Total cardiopulmonary bypass time for the operation was 171 minutes.  Followup  transesophageal echocardiogram performed after separation from bypass revealed a well-seated annuloplasty ring in the mitral position with a normal functioning mitral valve. There was no residual leak.  Left ventricular function was unchanged from preoperatively.  The mean gradient across the mitral valve was estimated to be 2 mmHg.  The femoral arterial and venous cannulae were removed uneventfully. There was a palpable pulse in the distal right common femoral artery after removal of the cannula. Protamine was administered to reverse the anticoagulation. The right internal jugular cannula was removed and manual pressure held on the neck for 15 minutes.  Single lung ventilation was begun. The atriotomy closure was inspected for hemostasis. The pericardial sac was drained using a 28 French Bard drain placed through the anterior port incision.  The pericardium was closed using a patch of core matrix bovine submucosal tissue patch. The right pleural space is irrigated with saline solution and inspected for hemostasis. The right pleural space was drained using a 28 French Bard drain placed through the posterior port incision. A single lumen On-Q catheter is placed through the chest tube incision and tunneled into the subpleural space posteriorly to cover the 2nd through the 6th intercostal nerve roots.  The On-Q catheter is flushed with 0.5% bupivacaine solution and ultimately connected to a continuous infusion pump.  The miniature thoracotomy incision was closed in multiple layers in routine fashion. The right groin incision was inspected for hemostasis and closed in multiple layers in routine fashion.  The post-bypass portion of the operation was notable for stable rhythm and hemodynamics.  No blood products were administered during the operation.   Disposition:  The patient tolerated the procedure well.  The patient was reintubated using a single lumen endotracheal tube and subsequently transported to the  surgical intensive care unit in stable condition. There were no intraoperative complications. All sponge instrument and needle counts are verified correct at completion of the operation.     [name] MD 01/04/2016 3:24 PM

## 2016-01-05 ENCOUNTER — Inpatient Hospital Stay (HOSPITAL_COMMUNITY): Payer: Medicare Other

## 2016-01-05 ENCOUNTER — Encounter (HOSPITAL_COMMUNITY): Payer: Self-pay | Admitting: Thoracic Surgery (Cardiothoracic Vascular Surgery)

## 2016-01-05 LAB — BASIC METABOLIC PANEL
ANION GAP: 11 (ref 5–15)
BUN: 15 mg/dL (ref 6–20)
CALCIUM: 7.4 mg/dL — AB (ref 8.9–10.3)
CO2: 18 mmol/L — AB (ref 22–32)
CREATININE: 1.04 mg/dL (ref 0.61–1.24)
Chloride: 110 mmol/L (ref 101–111)
GLUCOSE: 159 mg/dL — AB (ref 65–99)
Potassium: 4.2 mmol/L (ref 3.5–5.1)
Sodium: 139 mmol/L (ref 135–145)

## 2016-01-05 LAB — POCT I-STAT, CHEM 8
BUN: 19 mg/dL (ref 6–20)
CALCIUM ION: 1.12 mmol/L — AB (ref 1.13–1.30)
CREATININE: 1 mg/dL (ref 0.61–1.24)
Chloride: 103 mmol/L (ref 101–111)
Glucose, Bld: 148 mg/dL — ABNORMAL HIGH (ref 65–99)
HEMATOCRIT: 29 % — AB (ref 39.0–52.0)
HEMOGLOBIN: 9.9 g/dL — AB (ref 13.0–17.0)
Potassium: 4 mmol/L (ref 3.5–5.1)
Sodium: 139 mmol/L (ref 135–145)
TCO2: 22 mmol/L (ref 0–100)

## 2016-01-05 LAB — GLUCOSE, CAPILLARY
GLUCOSE-CAPILLARY: 134 mg/dL — AB (ref 65–99)
GLUCOSE-CAPILLARY: 150 mg/dL — AB (ref 65–99)
GLUCOSE-CAPILLARY: 134 mg/dL — AB (ref 65–99)
GLUCOSE-CAPILLARY: 140 mg/dL — AB (ref 65–99)
Glucose-Capillary: 119 mg/dL — ABNORMAL HIGH (ref 65–99)
Glucose-Capillary: 130 mg/dL — ABNORMAL HIGH (ref 65–99)

## 2016-01-05 LAB — CBC
HEMATOCRIT: 31.5 % — AB (ref 39.0–52.0)
HEMATOCRIT: 29.9 % — AB (ref 39.0–52.0)
HEMOGLOBIN: 9.7 g/dL — AB (ref 13.0–17.0)
Hemoglobin: 10.4 g/dL — ABNORMAL LOW (ref 13.0–17.0)
MCH: 30.1 pg (ref 26.0–34.0)
MCH: 29.7 pg (ref 26.0–34.0)
MCHC: 33 g/dL (ref 30.0–36.0)
MCHC: 32.4 g/dL (ref 30.0–36.0)
MCV: 91 fL (ref 78.0–100.0)
MCV: 91.4 fL (ref 78.0–100.0)
PLATELETS: 107 10*3/uL — AB (ref 150–400)
PLATELETS: 124 10*3/uL — AB (ref 150–400)
RBC: 3.46 MIL/uL — ABNORMAL LOW (ref 4.22–5.81)
RBC: 3.27 MIL/uL — AB (ref 4.22–5.81)
RDW: 13.3 % (ref 11.5–15.5)
RDW: 13.7 % (ref 11.5–15.5)
WBC: 9.3 10*3/uL (ref 4.0–10.5)
WBC: 11.4 10*3/uL — AB (ref 4.0–10.5)

## 2016-01-05 LAB — CREATININE, SERUM
Creatinine, Ser: 1.09 mg/dL (ref 0.61–1.24)
GFR calc non Af Amer: >60 mL/min (ref >60)

## 2016-01-05 LAB — MAGNESIUM
Magnesium: 2.3 mg/dL (ref 1.7–2.4)
Magnesium: 2.4 mg/dL (ref 1.7–2.4)

## 2016-01-05 MED ORDER — LEVOTHYROXINE SODIUM 100 MCG PO TABS
50.0000 ug | ORAL_TABLET | Freq: Every day | ORAL | Status: DC
  Administered 2016-01-05 – 2016-01-10 (×6): 50 ug via ORAL
  Filled 2016-01-05 (×6): qty 1

## 2016-01-05 MED ORDER — COUMADIN BOOK
Freq: Once | Status: AC
  Administered 2016-01-05: 11:00:00
  Filled 2016-01-05: qty 1

## 2016-01-05 MED ORDER — MORPHINE SULFATE (PF) 2 MG/ML IV SOLN
1.0000 mg | INTRAVENOUS | Status: DC | PRN
  Administered 2016-01-06 (×2): 2 mg via INTRAVENOUS
  Filled 2016-01-05 (×2): qty 1

## 2016-01-05 MED ORDER — WARFARIN SODIUM 2.5 MG PO TABS
2.5000 mg | ORAL_TABLET | Freq: Every day | ORAL | Status: DC
  Administered 2016-01-05 – 2016-01-09 (×5): 2.5 mg via ORAL
  Filled 2016-01-05 (×5): qty 1

## 2016-01-05 MED ORDER — PROMETHAZINE HCL 25 MG/ML IJ SOLN
12.5000 mg | Freq: Four times a day (QID) | INTRAMUSCULAR | Status: DC | PRN
  Administered 2016-01-05: 12.5 mg via INTRAVENOUS
  Filled 2016-01-05 (×2): qty 1

## 2016-01-05 MED ORDER — FUROSEMIDE 10 MG/ML IJ SOLN
20.0000 mg | Freq: Once | INTRAMUSCULAR | Status: AC
  Administered 2016-01-05: 20 mg via INTRAVENOUS
  Filled 2016-01-05: qty 2

## 2016-01-05 MED ORDER — WARFARIN - PHYSICIAN DOSING INPATIENT: Freq: Every day | Status: DC

## 2016-01-05 MED FILL — Lidocaine HCl IV Inj 20 MG/ML: INTRAVENOUS | Qty: 5 | Status: AC

## 2016-01-05 MED FILL — Sodium Bicarbonate IV Soln 8.4%: INTRAVENOUS | Qty: 50 | Status: AC

## 2016-01-05 MED FILL — Sodium Chloride IV Soln 0.9%: INTRAVENOUS | Qty: 2000 | Status: AC

## 2016-01-05 MED FILL — Electrolyte-R (PH 7.4) Solution: INTRAVENOUS | Qty: 3000 | Status: AC

## 2016-01-05 MED FILL — Mannitol IV Soln 20%: INTRAVENOUS | Qty: 500 | Status: AC

## 2016-01-05 MED FILL — Heparin Sodium (Porcine) Inj 1000 Unit/ML: INTRAMUSCULAR | Qty: 10 | Status: AC

## 2016-01-05 NOTE — Care Management Note (Signed)
Case Management Note  Patient Details  Name: Enri Chute MRN: [id number] Date of Birth: Sep 28, 1932  Subjective/Objective:     S/p mini MVR              Action/Plan:  Pt is from [place] with wife.  Wife will provide 24 hour supervision as recommended.  CM will continue to monitor for disposition needs   Expected Discharge Date:                  Expected Discharge Plan:  [place]  In-House Referral:     Discharge planning Services  CM Consult  Post Acute Care Choice:    Choice offered to:     DME Arranged:    DME Agency:     HH Arranged:    HH Agency:     Status of Service:  In process, will continue to follow  Medicare Important Message Given:    Date Medicare IM Given:    Medicare IM give by:    Date Additional Medicare IM Given:    Additional Medicare Important Message give by:     If discussed at [place] of Stay Meetings, dates discussed:    Additional Comments:  Maryclare Labrador, RN 01/05/2016, 3:33 PM

## 2016-01-05 NOTE — Progress Notes (Addendum)
[address]       [place] [address]             [phone]        CARDIOTHORACIC SURGERY PROGRESS NOTE   R1 Day Post-Op Procedure(s) (LRB): MINIMALLY INVASIVE MITRAL VALVE REPAIR (MVR) (Right) TRANSESOPHAGEAL ECHOCARDIOGRAM (TEE) (N/A) PATENT FORAMEN OVALE CLOSURE (N/A) CLIPPING OF ATRIAL APPENDAGE (N/A)  Subjective: Looks good.  Minimal pain.  Complains of nausea  Objective: Vital signs: BP Readings from Last 1 Encounters:  01/05/16 103/61   Pulse Readings from Last 1 Encounters:  01/05/16 80   Resp Readings from Last 1 Encounters:  01/05/16 10   Temp Readings from Last 1 Encounters:  01/05/16 97.5 F (36.4 C)     Hemodynamics: PAP: (25-47)/(11-22) 30/17 mmHg CO:  [2.7 L/min-5.7 L/min] 5.4 L/min CI:  [1.4 L/min/m2-3 L/min/m2] 2.9 L/min/m2  Physical Exam:  Rhythm:   Junctional - sinus brady 50 - AAI pacing  Breath sounds: clear  Heart sounds:  RRR w/out murmur  Incisions:  Dressings dry, intact  Abdomen:  Soft, non-distended, non-tender  Extremities:  Warm, well-perfused  Chest tubes:  Low volume thin serosanguinous output, no air leak    Intake/Output from previous day: 03/15 0701 - 03/16 0700 In: 7218.3 [I.V.:5131.3; Blood:427; NG/GT:60; IV Piggyback:1600] Out: [id number] [Urine:1875; Blood:1250; Chest Tube:625] Intake/Output this shift:    Lab Results:  CBC: Recent Labs  01/04/16 2218 01/05/16 0332  WBC 9.2 9.3  HGB 11.3* 10.4*  HCT 33.7* 31.5*  PLT 122* 107*    BMET:  Recent Labs  01/02/16 1220  01/04/16 2207 01/04/16 2218 01/05/16 0332  NA 141  < > 139  --  139  K 4.1  < > 4.5  --  4.2  CL 107  < > 109  --  110  CO2 24  --   --   --  18*  GLUCOSE 73  < > 114*  --  159*  BUN 20  < > 17  --  15  CREATININE 1.17  < > 0.90 1.04 1.04  CALCIUM 9.0  --   --   --  7.4*  < > = values in this interval not displayed.   PT/INR:   Recent Labs  01/04/16 1540  LABPROT 18.6*  INR 1.55*    CBG (last 3)   Recent  Labs  01/04/16 1913 01/04/16 2338 01/05/16 0339  GLUCAP 84 118* 134*    ABG    Component Value Date/Time   PHART 7.340* 01/04/2016 2302   PCO2ART 35.7 01/04/2016 2302   PO2ART 84.0 01/04/2016 2302   HCO3 19.3* 01/04/2016 2302   TCO2 20 01/04/2016 2302   ACIDBASEDEF 6.0* 01/04/2016 2302   O2SAT 96.0 01/04/2016 2302    CXR: Looks good.  Mild bibasilar atelectasis and pulm vasc congestion   Assessment/Plan: S/P Procedure(s) (LRB): MINIMALLY INVASIVE MITRAL VALVE REPAIR (MVR) (Right) TRANSESOPHAGEAL ECHOCARDIOGRAM (TEE) (N/A) PATENT FORAMEN OVALE CLOSURE (N/A) CLIPPING OF ATRIAL APPENDAGE (N/A)  Overall doing well POD1 Maintaining AAI paced rhythm w/ stable hemodynamics on low dose Neo for BP support Breathing comfortably w/ O2 sats 96% on 4 L/min via [place] Expected post op acute blood loss anemia, mild Expected post op volume excess, mild Expected post op atelectasis, mild Post op nausea Post op thrombocytopenia, mild Paroxysmal atrial fibrillation in OR - maintaining sinus rhythm on IV amiodarone   Continue AAI pacing   Stop amiodarone drip and hold beta blockers for now  [employer]  D/C lines  Wean Neo as tolerated  Hold diuretics until BP improved off Neo  Keep chest tubes until drainage decreased  Start coumadin slowly  Rexene Alberts, MD 01/05/2016 7:47 AM

## 2016-01-05 NOTE — Progress Notes (Signed)
Patient ID: Samuel [name], male   DOB: 08/20/1932, 82 y.o.   MRN: [id number]   SICU Evening Rounds:   Hemodynamically stable, atrial paced 80 Neo off   Urine output good  CT output 20-30 /hr.  CBC    Component Value Date/Time   WBC 11.4* 01/05/2016 1625   WBC 4.8 10/25/2015   RBC 3.27* 01/05/2016 1625   HGB 9.7* 01/05/2016 1625   HGB 9.9* 01/05/2016 1625   HCT 29.9* 01/05/2016 1625   HCT 29.0* 01/05/2016 1625   PLT 124* 01/05/2016 1625   MCV 91.4 01/05/2016 1625   MCH 29.7 01/05/2016 1625   MCHC 32.4 01/05/2016 1625   RDW 13.7 01/05/2016 1625     BMET    Component Value Date/Time   NA 139 01/05/2016 1625   NA 138 10/25/2015   K 4.0 01/05/2016 1625   CL 103 01/05/2016 1625   CO2 18* 01/05/2016 0332   GLUCOSE 148* 01/05/2016 1625   BUN 19 01/05/2016 1625   BUN 18 10/25/2015   CREATININE 1.09 01/05/2016 1625   CREATININE 1.00 01/05/2016 1625   CREATININE 1.1 10/25/2015   CALCIUM 7.4* 01/05/2016 0332   GFRNONAA >60 01/05/2016 1625   GFRAA >60 01/05/2016 1625     A/P:  Stable postop course. Continue current plans

## 2016-01-06 ENCOUNTER — Inpatient Hospital Stay (HOSPITAL_COMMUNITY): Payer: Medicare Other

## 2016-01-06 LAB — BASIC METABOLIC PANEL
ANION GAP: 8 (ref 5–15)
BUN: 19 mg/dL (ref 6–20)
CALCIUM: 7.9 mg/dL — AB (ref 8.9–10.3)
CO2: 24 mmol/L (ref 22–32)
CREATININE: 1.17 mg/dL (ref 0.61–1.24)
Chloride: 106 mmol/L (ref 101–111)
GFR calc Af Amer: >60 mL/min (ref >60)
GFR, EST NON AFRICAN AMERICAN: 56 mL/min — AB (ref >60)
GLUCOSE: 124 mg/dL — AB (ref 65–99)
Potassium: 4.3 mmol/L (ref 3.5–5.1)
Sodium: 138 mmol/L (ref 135–145)

## 2016-01-06 LAB — PROTIME-INR
INR: 1.56 — AB (ref 0.00–1.49)
PROTHROMBIN TIME: 18.7 s — AB (ref 11.6–15.2)

## 2016-01-06 LAB — CBC
HCT: 29.4 % — ABNORMAL LOW (ref 39.0–52.0)
HEMOGLOBIN: 10.2 g/dL — AB (ref 13.0–17.0)
MCH: 32 pg (ref 26.0–34.0)
MCHC: 34.7 g/dL (ref 30.0–36.0)
MCV: 92.2 fL (ref 78.0–100.0)
PLATELETS: 118 10*3/uL — AB (ref 150–400)
RBC: 3.19 MIL/uL — ABNORMAL LOW (ref 4.22–5.81)
RDW: 14.1 % (ref 11.5–15.5)
WBC: 11.9 10*3/uL — ABNORMAL HIGH (ref 4.0–10.5)

## 2016-01-06 LAB — GLUCOSE, CAPILLARY
GLUCOSE-CAPILLARY: 164 mg/dL — AB (ref 65–99)
Glucose-Capillary: 105 mg/dL — ABNORMAL HIGH (ref 65–99)
Glucose-Capillary: 115 mg/dL — ABNORMAL HIGH (ref 65–99)
Glucose-Capillary: 155 mg/dL — ABNORMAL HIGH (ref 65–99)

## 2016-01-06 MED ORDER — FUROSEMIDE 10 MG/ML IJ SOLN
20.0000 mg | Freq: Four times a day (QID) | INTRAMUSCULAR | Status: AC
  Administered 2016-01-06 (×3): 20 mg via INTRAVENOUS
  Filled 2016-01-06 (×4): qty 2

## 2016-01-06 MED ORDER — POTASSIUM CHLORIDE CRYS ER 20 MEQ PO TBCR
20.0000 meq | EXTENDED_RELEASE_TABLET | Freq: Two times a day (BID) | ORAL | Status: AC
  Administered 2016-01-07 – 2016-01-08 (×4): 20 meq via ORAL
  Filled 2016-01-06 (×4): qty 1

## 2016-01-06 MED ORDER — ASPIRIN EC 81 MG PO TBEC
81.0000 mg | DELAYED_RELEASE_TABLET | Freq: Every day | ORAL | Status: DC
  Administered 2016-01-06 – 2016-01-10 (×5): 81 mg via ORAL
  Filled 2016-01-06 (×5): qty 1

## 2016-01-06 MED ORDER — ALFUZOSIN HCL ER 10 MG PO TB24
10.0000 mg | ORAL_TABLET | Freq: Every day | ORAL | Status: DC
  Administered 2016-01-06 – 2016-01-10 (×5): 10 mg via ORAL
  Filled 2016-01-06 (×5): qty 1

## 2016-01-06 MED ORDER — SODIUM CHLORIDE 0.9% FLUSH: 3.0000 mL | INTRAVENOUS | Status: DC | PRN

## 2016-01-06 MED ORDER — FUROSEMIDE 40 MG PO TABS
40.0000 mg | ORAL_TABLET | Freq: Two times a day (BID) | ORAL | Status: AC
  Administered 2016-01-07 – 2016-01-08 (×4): 40 mg via ORAL
  Filled 2016-01-06 (×4): qty 1

## 2016-01-06 MED ORDER — SODIUM CHLORIDE 0.9% FLUSH
3.0000 mL | Freq: Two times a day (BID) | INTRAVENOUS | Status: DC
  Administered 2016-01-06 – 2016-01-09 (×7): 3 mL via INTRAVENOUS

## 2016-01-06 MED ORDER — AMIODARONE HCL 200 MG PO TABS: 200.0000 mg | ORAL_TABLET | Freq: Every day | ORAL | Status: DC

## 2016-01-06 MED ORDER — SIMVASTATIN 20 MG PO TABS
20.0000 mg | ORAL_TABLET | Freq: Every day | ORAL | Status: DC
  Administered 2016-01-06 – 2016-01-09 (×4): 20 mg via ORAL
  Filled 2016-01-06 (×4): qty 1

## 2016-01-06 MED ORDER — SODIUM CHLORIDE 0.9 % IV SOLN: 250.0000 mL | INTRAVENOUS | Status: DC | PRN

## 2016-01-06 MED ORDER — AMIODARONE HCL 200 MG PO TABS
200.0000 mg | ORAL_TABLET | Freq: Every day | ORAL | Status: DC
  Administered 2016-01-06 – 2016-01-10 (×5): 200 mg via ORAL
  Filled 2016-01-06 (×5): qty 1

## 2016-01-06 MED ORDER — MOVING RIGHT ALONG BOOK
Freq: Once | Status: AC
  Administered 2016-01-06: 08:00:00
  Filled 2016-01-06: qty 1

## 2016-01-06 MED FILL — Dexmedetomidine HCl in NaCl 0.9% IV Soln 400 MCG/100ML: INTRAVENOUS | Qty: 100 | Status: AC

## 2016-01-06 NOTE — Care Management Important Message (Signed)
Important Message  Patient Details  Name: Samuel [name] MRN: [id number] Date of Birth: [birth year]   Medicare Important Message Given:  Yes    Tashiba Timoney P Risha Barretta 01/06/2016, 2:23 PM

## 2016-01-06 NOTE — Progress Notes (Signed)
      [address]       [place] [address]             [phone]        CARDIOTHORACIC SURGERY PROGRESS NOTE   R2 Days Post-Op Procedure(s) (LRB): MINIMALLY INVASIVE MITRAL VALVE REPAIR (MVR) (Right) TRANSESOPHAGEAL ECHOCARDIOGRAM (TEE) (N/A) PATENT FORAMEN OVALE CLOSURE (N/A) CLIPPING OF ATRIAL APPENDAGE (N/A)  Subjective: Looks good.  Mild soreness in chest.  Objective: Vital signs: BP Readings from Last 1 Encounters:  01/06/16 104/65   Pulse Readings from Last 1 Encounters:  01/06/16 60   Resp Readings from Last 1 Encounters:  01/06/16 11   Temp Readings from Last 1 Encounters:  01/06/16 98.5 F (36.9 C) Oral    Hemodynamics: PAP: (34-41)/(11-21) 41/11 mmHg CO:  [6.4 L/min] 6.4 L/min CI:  [3.4 L/min/m2] 3.4 L/min/m2  Physical Exam:  Rhythm:   sinus  Breath sounds: clear  Heart sounds:  RRR w/out murmur  Incisions:  Clean and dry  Abdomen:  Soft, non-distended, non-tender  Extremities:  Warm, well-perfused  Chest tubes:  Decreasing volume thin serosanguinous output, no air leak    Intake/Output from previous day: 03/16 0701 - 03/17 0700 In: 906.6 [P.O.:780; I.V.:76.6; IV Piggyback:50] Out: 1740 [Urine:950; Chest Tube:790] Intake/Output this shift:    Lab Results:  CBC: Recent Labs  01/05/16 1625 01/06/16 0513  WBC 11.4* 11.9*  HGB 9.9*  9.7* 10.2*  HCT 29.0*  29.9* 29.4*  PLT 124* 118*    BMET:  Recent Labs  01/05/16 0332 01/05/16 1625 01/06/16 0513  NA 139 139 138  K 4.2 4.0 4.3  CL 110 103 106  CO2 18*  --  24  GLUCOSE 159* 148* 124*  BUN 15 19 19   CREATININE 1.04 1.09  1.00 1.17  CALCIUM 7.4*  --  7.9*     PT/INR:   Recent Labs  01/06/16 0513  LABPROT 18.7*  INR 1.56*    CBG (last 3)   Recent Labs  01/05/16 1915 01/05/16 2350 01/06/16 0406  GLUCAP 130* 119* 105*    ABG    Component Value Date/Time   PHART 7.340* 01/04/2016 2302   PCO2ART 35.7 01/04/2016 2302   PO2ART 84.0  01/04/2016 2302   HCO3 19.3* 01/04/2016 2302   TCO2 22 01/05/2016 1625   ACIDBASEDEF 6.0* 01/04/2016 2302   O2SAT 96.0 01/04/2016 2302    CXR: Looks good.  Stable mild bibasilar atelectasis and pulm vasc congestion  Assessment/Plan: S/P Procedure(s) (LRB): MINIMALLY INVASIVE MITRAL VALVE REPAIR (MVR) (Right) TRANSESOPHAGEAL ECHOCARDIOGRAM (TEE) (N/A) PATENT FORAMEN OVALE CLOSURE (N/A) CLIPPING OF ATRIAL APPENDAGE (N/A)  Overall doing well POD2 Maintaining sinus rhythm w/ stable BP off Neo Breathing comfortably w/ O2 sats 91-92% on 4 L/min via [place] Expected post op acute blood loss anemia, mild Expected post op volume excess, mild Expected post op atelectasis, mild Post op thrombocytopenia, mild Paroxysmal atrial fibrillation in OR - maintaining sinus rhythm so far postop   Mobilize  Diuresis  Keep chest tubes until drainage decreased  Start coumadin slowly  Transfer step down  Rexene Alberts, MD 01/06/2016 7:48 AM

## 2016-01-06 NOTE — Discharge Summary (Addendum)
Physician Discharge Summary       [address]       [place] [address]             [phone]    Patient ID: Samuel [name] MRN: [id number] DOB/AGE: Apr 09, 1932 82 y.o.  Admit date: 01/04/2016 Discharge date: 01/10/2016  Admission Diagnosis: Severe mitral regurgitation  Active Diagnoses:  1. Patent Foramen Ovale 2. Paroxysmal atrial fibrillation (HCC) 3. Hypothyroidism 4. Hyperlipidemia LDL goal <100 5. Essential hypertension, benign 6. Malignant neoplasm of prostate ([place]) 7. Hypertrophy of prostate without urinary obstruction and other lower urinary tract symptoms (LUTS) 8. Carotid artery disease ([place]) 9. Aortoiliac occlusive disease ([place]) 10. GERD (gastroesophageal reflux disease) 11. Remote tobacco abuse 12. ABL anemia 13. Mild thrombocytopenia  Procedure (s):   Minimally-Invasive Mitral Valve Repair Complex valvuloplasty including triangular resection of flail segment of posterior leaflet Artificial Gore-tex neochord placement x6 Sorin Memo 3D Ring Annuloplasty (size 99mm, catalog # [id number], serial # [id number])   Clipping of Left Atrial Appendage   Closure of Patent Foramen Ovale by Dr. Roxy Manns on 01/04/2016.  History of Presenting Illness: Patient is an 82 year old male with history of hypertension and prostate cancer who has otherwise been remarkably healthy most of his life but has been referred for surgical consultation to discuss treatment options for recently discovered mitral valve prolapse with severe mitral regurgitation. The patient is retired from the [employer] and lived for many years in [place] where he was followed by a cardiologist because of history of heart murmur and abnormal electrocardiogram. He states that he has been told that he had a slight heart murmur for most of his life but this did not affect his ability to be in the [employer]. There is no reported history of rheumatic fever. Prior to  elective surgery for radioactive seed implantation for management of the patient's prostate cancer the patient was noted to have abnormal EKG and referred for cardiology evaluation. He carries the diagnosis of left atrial enlargement and paroxysmal atrial fibrillation. He states that the irregular heart rhythm was discovered only on a Holter monitor study that was performed after the patient suffered a brief syncopal event several years ago. Approximately 5 years ago the patient moved to [place] where he now lives with his wife at the [place] community. For the last several years he has been followed by Dr. Wynonia Lawman who recently noted a prominent systolic murmur on physical exam that was noticeably different in comparison with previous exams. Subsequent transthoracic echocardiogram revealed findings consistent with mitral valve prolapse with possible flail segment of the posterior leaflet and severe mitral regurgitation. The patient was referred for surgical consultation.  The patient remains very active physically and functionally completely independent. He lives in a town house with his wife. His wife has significant short-term memory dysfunction related to dementia but still resides at home with him and remains reasonably functional. The patient exercises regularly, including swimming, aerobic exercise on a treadmill, and weight lifting. He denies any symptoms of exertional shortness of breath or fatigue. He denies any significant problems with PND, orthopnea, or lower extremity edema. He reports no other significant physical limitations other than some problems related to chronic arthritis and arthralgias in both knees. He specifically denies any history of exertional chest pain or chest tightness. He has never had any palpitations, dizzy spells, nor syncope.   Patient returned to the office on 03/14 for follow-up of severe primary mitral regurgitation with tentative plans to proceed  with  elective mitral valve repair later . He was last seen here in our office on 11/21/2015. Since then, he has continued to remain clinically stable. He reports no new problems or complaints and is eager to proceed with surgery as previously planned.  The patient was again counseled at length regarding the indications, risks, and potential benefits of mitral valve repair. The rationale for elective surgery has been explained including comparison between surgery and continued medical therapy with close follow-up. The likelihood of successful and durable valve repairs been discussed. Expectations for the patient's postoperative convalescence of been discussed. Based upon these findings and previous experience, I have quoted them a greater than 90 percent likelihood of successful valve repair. In the unlikely event that their valve cannot be successfully repaired, we discussed the possibility of replacing the mitral valve using a mechanical prosthesis with the attendant need for long-term anticoagulation versus the alternative of replacing it using a bioprosthetic tissue valve with its potential for late structural valve deterioration and failure, depending upon the patient's longevity. The patient specifically requests that if the mitral valve must be replaced that it be done using a bioprosthetic tissue valve.The patient understands and accepts all potential risks of surgery. He was admitted on 01/04/2016 in order to undergo a minimally invasive mitral valve repair, closure of PFO, and LA clip.  Brief Hospital Course:  The patient was extubated later the evening of surgery without difficulty. He remained afebrile and hemodynamically stable. He was initially AAI paced and was weaned off of Neo Synephrine drip. Gordy Councilman, a line, chest tubes, and foley were removed early in the post operative course.  He was volume over loaded and diuresed. He had ABL anemia. He did not require a post op transfusion. His  last H and H was 9.3 and 28.7. He also had mild thrombocytopenia. His last platelet count was 121,000. He was weaned off the insulin drip. The patient's HGA1C pre op was 5.5. He was started on Coumadin and his PT and INR were monitored daily. His last INR was 2.56. He will be discharged on Coumadin 2 mg. The patient was felt surgically stable for transfer from the ICU to [facility]U for further convalescence on 01/06/2016. He continues to progress with cardiac rehab. He initially required 3 liters of oxygen via [place]. He was weaned to 1 liter of oxygen via [place]. CXR on 03/21 showed low lung volumes, small, stable right  pneumothorax, right chest wall emphysema, bibasilar atelectasis R>L, and small left pleural effusion. He will need to continue using his incentive spirometer and flutter valve. He has been tolerating a diet and has had a bowel movement. Epicardial pacing wires were removed prior to chest tubes. Chest tube sutures will be removed by [facility] after discharge on 01/18/2016. Dr. Roxy Manns reviewed his 2D echo (LVEF 60-65%, mild mitral and tricuspid regurgitation, and no pericardial effusion)and felt the patient is felt surgically stable for discharge today.   Latest Vital Signs: Blood pressure 107/59, pulse 79, temperature 97.8 F (36.6 C), temperature source Oral, resp. rate 18, weight 159 lb 6.3 oz (72.3 kg), SpO2 92 %.  Physical Exam: Cardiovascular: RRR Pulmonary: Slightly diminished right base and left lung is clear Abdomen: Soft, non tender, bowel sounds present. Extremities: No lower extremity edema. Wounds: Clean and dry. No erythema or signs of infection.  Discharge Condition:Stable and discharged to home  Recent laboratory studies:  Lab Results  Component Value Date   WBC 6.9 01/08/2016   HGB 9.3* 01/08/2016   HCT 28.7*  01/08/2016   MCV 92.0 01/08/2016   PLT 121* 01/08/2016   Lab Results  Component Value Date   NA 136 01/10/2016   K 3.9 01/10/2016   CL 100* 01/10/2016     CO2 26 01/10/2016   CREATININE 1.18 01/10/2016   GLUCOSE 92 01/10/2016    Diagnostic Studies:  EXAM: CHEST 2 VIEW  COMPARISON: Chest radiograph from one day prior.  FINDINGS: Cardiac valvular prosthesis is in place. Stable cardiomediastinal silhouette with top-normal heart size. Stable tiny right apical pneumothorax. No left pneumothorax. Stable small right pleural effusion. Small left pleural effusion, slightly decreased. Mild to moderate bibasilar atelectasis, slightly decreased. No overt pulmonary edema. Stable subcutaneous emphysema in the right lateral chest wall.  IMPRESSION: 1. Stable small right hydropneumothorax, with stable tiny apical pneumothorax component and stable small basilar pleural effusion component. 2. Small left pleural effusion, slightly decreased . 3. Mild-to-moderate bibasilar atelectasis, slightly improved.   Electronically Signed  By: Ilona Sorrel M.D.  On: 01/10/2016 08:06  Discharge Medications:   Medication List    STOP taking these medications        metoprolol succinate 25 MG 24 hr tablet  Commonly known as:  TOPROL-XL      TAKE these medications        alfuzosin 10 MG 24 hr tablet  Commonly known as:  UROXATRAL  Take 10 mg by mouth daily with breakfast. Take one daily to reduce bladder spasm     amiodarone 200 MG tablet  Commonly known as:  PACERONE  Take 1 tablet (200 mg total) by mouth daily.     aspirin 81 MG tablet  Take 81 mg by mouth daily. Take one daily for anticoagulation     furosemide 40 MG tablet  Commonly known as:  LASIX  Take 1 tablet (40 mg total) by mouth daily. For 5 days then stop.     Levothyroxine Sodium 50 MCG Caps  Take 1 capsule (50 mcg total) by mouth daily before breakfast.     lisinopril 2.5 MG tablet  Commonly known as:  PRINIVIL,ZESTRIL  Take 1 tablet (2.5 mg total) by mouth daily.     nabumetone 750 MG tablet  Commonly known as:  RELAFEN  TAKE 1 TABLET TWICE A DAY FOR SWELLING      Potassium Chloride ER 20 MEQ Tbcr  Take 10 mEq by mouth daily.     simvastatin 20 MG tablet  Commonly known as:  ZOCOR  TAKE 1 TABLET DAILY TO MAINTAIN LOW CHOLESTEROL     solifenacin 5 MG tablet  Commonly known as:  VESICARE  Take 5 mg by mouth daily.     traMADol 50 MG tablet  Commonly known as:  ULTRAM  Take 1 tablet (50 mg total) by mouth every 6 (six) hours as needed for moderate pain.     warfarin 2 MG tablet  Commonly known as:  COUMADIN  Take 1 tablet (2 mg total) by mouth daily at 6 PM. Or as directed.       The patient has been discharged on:   1.Beta Blocker:  Yes [   ]                              No   [ x  ]  If No, reason:Bradycardia  2.Ace Inhibitor/ARB: Yes [ x  ]                                     No  [    ]                                     If No, reason:  3.Statin:   Yes [ x  ]                  No  [   ]                  If No, reason:  4.Ecasa:  Yes  [ x  ]                  No   [   ]                  If No, reason:  Follow Up Appointments: Follow-up Information    Follow up with W Tollie Eth, MD.   Specialty:  Cardiology   Why:  Call for a follow up appointment for 2 weeks   Contact information:   [place] [address] [phone]       Follow up with Rexene Alberts, MD On 01/23/2016.   Specialty:  Cardiothoracic Surgery   Why:  PA/LAT CXR (to be taken at [facility] which is in the same building as Dr. Guy Sandifer office) on 01/23/2016 at 12:45 pm;Appointment time is at  1:30 pm   Contact information:   [place] [address] [phone]       Follow up with [facility].   Why:  Please draw PT and INR (as is on Coumadin, INR goal 2-2.5) on Thursday 01/12/2016 and call or fax result to Dr. Thurman Coyer office      Follow up with [facility].   Why:  Please remove chest tube sutures 03/292/2017.      Signed: ZIMMERMAN,DONIELLE  MPA-C 01/10/2016, 11:04 AM

## 2016-01-06 NOTE — Significant Event (Signed)
Patient transferred safely to 2W20, taken via wheelchair per his requests. VS stable during the transfer. Patient's son at bedside. CT place to wall suction, on 3L [place]. Personal belongings taken to new room (cellphone, chapsticks). Report given to [facility], receiving RN in 2W. Ravynn Hogate, Therapist, sports.

## 2016-01-07 LAB — PROTIME-INR
INR: 1.81 — ABNORMAL HIGH (ref 0.00–1.49)
Prothrombin Time: 21 seconds — ABNORMAL HIGH (ref 11.6–15.2)

## 2016-01-07 LAB — BASIC METABOLIC PANEL
ANION GAP: 8 (ref 5–15)
BUN: 23 mg/dL — ABNORMAL HIGH (ref 6–20)
CALCIUM: 7.9 mg/dL — AB (ref 8.9–10.3)
CO2: 26 mmol/L (ref 22–32)
Chloride: 105 mmol/L (ref 101–111)
Creatinine, Ser: 1.22 mg/dL (ref 0.61–1.24)
GFR calc Af Amer: >60 mL/min (ref >60)
GFR calc non Af Amer: 53 mL/min — ABNORMAL LOW (ref >60)
GLUCOSE: 96 mg/dL (ref 65–99)
Potassium: 4.1 mmol/L (ref 3.5–5.1)
Sodium: 139 mmol/L (ref 135–145)

## 2016-01-07 LAB — CBC
HEMATOCRIT: 28.1 % — AB (ref 39.0–52.0)
Hemoglobin: 9.1 g/dL — ABNORMAL LOW (ref 13.0–17.0)
MCH: 29.9 pg (ref 26.0–34.0)
MCHC: 32.4 g/dL (ref 30.0–36.0)
MCV: 92.4 fL (ref 78.0–100.0)
Platelets: 101 10*3/uL — ABNORMAL LOW (ref 150–400)
RBC: 3.04 MIL/uL — ABNORMAL LOW (ref 4.22–5.81)
RDW: 14 % (ref 11.5–15.5)
WBC: 7.8 10*3/uL (ref 4.0–10.5)

## 2016-01-07 MED ORDER — WARFARIN VIDEO
Freq: Once | Status: AC
  Administered 2016-01-07: 13:00:00

## 2016-01-07 NOTE — Progress Notes (Addendum)
      [address]       [place], [address]             [phone]      3 Days Post-Op Procedure(s) (LRB): MINIMALLY INVASIVE MITRAL VALVE REPAIR (MVR) (Right) TRANSESOPHAGEAL ECHOCARDIOGRAM (TEE) (N/A) PATENT FORAMEN OVALE CLOSURE (N/A) CLIPPING OF ATRIAL APPENDAGE (N/A)   Subjective:  Samuel [name] states he is doing okay.  He continues to have some incisional pain along right chest  Objective: Vital signs in last 24 hours: Temp:  [97.9 F (36.6 C)-99.2 F (37.3 C)] 99.2 F (37.3 C) (03/18 0359) Pulse Rate:  [67-70] 70 (03/18 0359) Cardiac Rhythm:  [-] Atrial paced (03/18 0700) Resp:  [10-26] 18 (03/18 0359) BP: (92-117)/(55-65) 116/61 mmHg (03/18 0359) SpO2:  [90 %-95 %] 90 % (03/18 0359) Weight:  [172 lb 9.9 oz (78.3 kg)] 172 lb 9.9 oz (78.3 kg) (03/18 0359)  Intake/Output from previous day: 03/17 0701 - 03/18 0700 In: 173 [P.O.:120; I.V.:3; IV Piggyback:50] Out: 1615 [Urine:1175; Chest Tube:440] Intake/Output this shift: Total I/O In: -  Out: 30 [Chest Tube:30]  General appearance: alert, cooperative and no distress Heart: regular rate and rhythm Lungs: clear to auscultation bilaterally Abdomen: soft, non-tender; bowel sounds normal; no masses,  no organomegaly Extremities: edema 1+ pitting Wound: clean and dry, ecchymosis present  Lab Results:  Recent Labs  01/06/16 0513 01/07/16 0350  WBC 11.9* 7.8  HGB 10.2* 9.1*  HCT 29.4* 28.1*  PLT 118* 101*   BMET:  Recent Labs  01/06/16 0513 01/07/16 0350  NA 138 139  K 4.3 4.1  CL 106 105  CO2 24 26  GLUCOSE 124* 96  BUN 19 23*  CREATININE 1.17 1.22  CALCIUM 7.9* 7.9*    PT/INR:  Recent Labs  01/07/16 0350  LABPROT 21.0*  INR 1.81*   ABG    Component Value Date/Time   PHART 7.340* 01/04/2016 2302   HCO3 19.3* 01/04/2016 2302   TCO2 22 01/05/2016 1625   ACIDBASEDEF 6.0* 01/04/2016 2302   O2SAT 96.0 01/04/2016 2302   CBG (last 3)   Recent Labs  01/06/16 0725  01/06/16 1147 01/06/16 1525  GLUCAP 115* 155* 164*    Assessment/Plan: S/P Procedure(s) (LRB): MINIMALLY INVASIVE MITRAL VALVE REPAIR (MVR) (Right) TRANSESOPHAGEAL ECHOCARDIOGRAM (TEE) (N/A) PATENT FORAMEN OVALE CLOSURE (N/A) CLIPPING OF ATRIAL APPENDAGE (N/A)  1. CV- Sinus brady under pacer, rate in the 60s- on Amiodarone at 200 mg daily 2. INR 1.81, continue Coumadin at current regimen 3. Pulm- no acute issues, off oxygen 4. Chest tube 440 cc output yesterday, 30 cc since midnight ( vac level at 60 this morning)- output a little high yesterday to remove, will reassess and can hopefully d/c in AM 5. Renal- creatinine is slowly rising currently at 1.22, weight is trending down, currently 5 lbs above preoperative weight, on Lasix 40 mg BID, may have to decrease dose if creatinine continues to rise 6. Thrombocytopenia- plts have dropped down to 101, avoid heparin products and monitor 7. Dispo- patient stable, maintaining sinus brady, continue coumadin, diuresis, hopefully can remove chest tubes in AM   LOS: 3 days    BARRETT, ERIN 01/07/2016  I have seen and examined the patient and agree with the assessment and plan as outlined.  Rexene Alberts, MD 01/07/2016 8:00 PM

## 2016-01-07 NOTE — Progress Notes (Signed)
CARDIAC REHAB PHASE I   PRE:  Rate/Rhythm: 70 pacing  BP:   Sitting:123/63     SaO2: 89% 3L  MODE:  Ambulation: 238 ft   POST:  Rate/Rhythm: 70 pacing  BP:   Sitting: 116/55     SaO2: 92% 3L 1100-1200 Pt ambulated 236ft with 2 person assist, gait belt, Rolator, pacer and 3L of oxygen and family member follow closely behind.Pt walked on 3L of O2 and O2 remained over 88% while ambulating. At rest O2 increased to 92% 3L. Returned pt to recliner with LE elevated, call bell and telephone in reach. Discussed importance of IS and spit cup. Had pt practiced sit to stand with heart pillow to avoid using arms. Pt has a great support system and in great spirit.   Samuel Pickup D Elcie Pelster,MS,ACSM-RCEP 01/07/2016 12:04 PM

## 2016-01-08 LAB — BASIC METABOLIC PANEL
Anion gap: 7 (ref 5–15)
BUN: 15 mg/dL (ref 6–20)
CHLORIDE: 107 mmol/L (ref 101–111)
CO2: 24 mmol/L (ref 22–32)
Calcium: 7.9 mg/dL — ABNORMAL LOW (ref 8.9–10.3)
Creatinine, Ser: 1.01 mg/dL (ref 0.61–1.24)
GFR calc Af Amer: >60 mL/min (ref >60)
GFR calc non Af Amer: >60 mL/min (ref >60)
Glucose, Bld: 88 mg/dL (ref 65–99)
POTASSIUM: 3.9 mmol/L (ref 3.5–5.1)
SODIUM: 138 mmol/L (ref 135–145)

## 2016-01-08 LAB — CBC
HEMATOCRIT: 28.7 % — AB (ref 39.0–52.0)
Hemoglobin: 9.3 g/dL — ABNORMAL LOW (ref 13.0–17.0)
MCH: 29.8 pg (ref 26.0–34.0)
MCHC: 32.4 g/dL (ref 30.0–36.0)
MCV: 92 fL (ref 78.0–100.0)
Platelets: 121 10*3/uL — ABNORMAL LOW (ref 150–400)
RBC: 3.12 MIL/uL — AB (ref 4.22–5.81)
RDW: 13.6 % (ref 11.5–15.5)
WBC: 6.9 10*3/uL (ref 4.0–10.5)

## 2016-01-08 LAB — PROTIME-INR
INR: 2.06 — ABNORMAL HIGH (ref 0.00–1.49)
Prothrombin Time: 23.1 seconds — ABNORMAL HIGH (ref 11.6–15.2)

## 2016-01-08 MED ORDER — DIPHENHYDRAMINE HCL 25 MG PO CAPS
25.0000 mg | ORAL_CAPSULE | Freq: Three times a day (TID) | ORAL | Status: DC | PRN
  Administered 2016-01-08 – 2016-01-09 (×2): 25 mg via ORAL
  Filled 2016-01-08 (×2): qty 1

## 2016-01-08 MED ORDER — WARFARIN SODIUM 2.5 MG PO TABS: 2.5000 mg | ORAL_TABLET | Freq: Every day | ORAL | Status: DC

## 2016-01-08 NOTE — Progress Notes (Signed)
Katrina Doree Fudge Rn rmv'd wires. Had no resistance, pt tolerated procedure well. Has no pain. B/P is 114/65.

## 2016-01-08 NOTE — Progress Notes (Signed)
Chest tube rmv'd, no drainage noted. Pt tolerated procedure well, has no pain.

## 2016-01-08 NOTE — Progress Notes (Addendum)
      [address]       [place] [address]             [phone]      4 Days Post-Op Procedure(s) (LRB): MINIMALLY INVASIVE MITRAL VALVE REPAIR (MVR) (Right) TRANSESOPHAGEAL ECHOCARDIOGRAM (TEE) (N/A) PATENT FORAMEN OVALE CLOSURE (N/A) CLIPPING OF ATRIAL APPENDAGE (N/A)   Subjective:  Samuel [name] continues to complain of right sided discomfort.  He is not using pain medications and I encouraged him to use them for pain as needed.  + ambulation  + BM  Objective: Vital signs in last 24 hours: Temp:  [97.6 F (36.4 C)-99.2 F (37.3 C)] 99 F (37.2 C) (03/19 0437) Pulse Rate:  [63-70] 63 (03/19 0437) Cardiac Rhythm:  [-] Atrial paced;Normal sinus rhythm (03/18 1921) Resp:  [16-18] 18 (03/19 0437) BP: (123-130)/(58-72) 126/64 mmHg (03/19 0437) SpO2:  [91 %-93 %] 93 % (03/19 0437) Weight:  [168 lb 11.2 oz (76.522 kg)] 168 lb 11.2 oz (76.522 kg) (03/19 0437)  Intake/Output from previous day: 03/18 0701 - 03/19 0700 In: 236 [P.O.:236] Out: 2755 [Urine:2425; Chest Tube:330] Intake/Output this shift: Total I/O In: -  Out: 120 [Chest Tube:120]  General appearance: alert, cooperative and no distress Heart: regular rate and rhythm Lungs: clear to auscultation bilaterally Abdomen: soft, non-tender; bowel sounds normal; no masses,  no organomegaly Extremities: edema trace Wound: clean and dry, ecchymosis present  Lab Results:  Recent Labs  01/07/16 0350 01/08/16 0409  WBC 7.8 6.9  HGB 9.1* 9.3*  HCT 28.1* 28.7*  PLT 101* 121*   BMET:  Recent Labs  01/07/16 0350 01/08/16 0409  NA 139 138  K 4.1 3.9  CL 105 107  CO2 26 24  GLUCOSE 96 88  BUN 23* 15  CREATININE 1.22 1.01  CALCIUM 7.9* 7.9*    PT/INR:  Recent Labs  01/08/16 0409  LABPROT 23.1*  INR 2.06*   ABG    Component Value Date/Time   PHART 7.340* 01/04/2016 2302   HCO3 19.3* 01/04/2016 2302   TCO2 22 01/05/2016 1625   ACIDBASEDEF 6.0* 01/04/2016 2302   O2SAT 96.0  01/04/2016 2302   CBG (last 3)   Recent Labs  01/06/16 0725 01/06/16 1147 01/06/16 1525  GLUCAP 115* 155* 164*    Assessment/Plan: S/P Procedure(s) (LRB): MINIMALLY INVASIVE MITRAL VALVE REPAIR (MVR) (Right) TRANSESOPHAGEAL ECHOCARDIOGRAM (TEE) (N/A) PATENT FORAMEN OVALE CLOSURE (N/A) CLIPPING OF ATRIAL APPENDAGE (N/A)  1. CV- Sinus Loletha Grayer, pacer turned off yesterday- on Amiodarone at 200 mg daily 2. INR 2.06, continue Coumadin at 2.5 mg daily 3. Chest tubes- 360 cc output yesterday ( level currently at 420)- leave chest tubes today 4. Pulm- wean oxygen as tolerated, patient desats with ambulation, continue IS 5. Renal- creatinine improved, down to 1.01, weight continues to trend down, continue Lasix 6.Thrombocytopenia- improved, back to 121K 7. Dispo- patient stable, tolerating Bradycardia, will d/c EPW today, continue diuresis, leave chest tube today with output of 360 yesterday  LOS: 4 days    [name], Samuel 01/08/2016  I have seen and examined the patient and agree with the assessment and plan as outlined.  D/C wires and chest tubes.  Mobilize.  Wean O2 as tolerated.  Possible d/c to SNF at [facility] in 1-2 days.  Samuel Alberts, MD 01/08/2016 12:49 PM

## 2016-01-08 NOTE — Discharge Instructions (Addendum)
Mitral Valve Repair, Care After Refer to this sheet in the next few weeks. These instructions provide you with information on caring for yourself after your procedure. Your health care provider may also give you specific instructions. Your treatment has been planned according to current medical practices, but problems sometimes occur. Call your health care provider if you have any problems or questions after your procedure.  HOME CARE INSTRUCTIONS   Take medicines only as directed by your health care provider.  Take your temperature every morning for the first 7 days after surgery. Write these down.  Weigh yourself every morning for at least 7 days after surgery. Write your weight down.  Wear elastic stockings during the day for at least 2 weeks after surgery or as directed by your health care provider. Use them longer if your ankles are swollen. The stockings help blood flow and help reduce swelling in the legs.  Take frequent naps or rest often throughout the day.  Avoid lifting more than 10 lb (4.5 kg) or pushing or pulling things with your arms for 6-8 weeks or as directed by your health care provider.  Avoid driving or airplane travel for 4-6 weeks after surgery or as directed. If you are riding in a car for an extended period, stop every 1-2 hours to stretch your legs.  Avoid crossing your legs.  Avoid climbing stairs and using the handrail to pull yourself up for the first 2-3 weeks after surgery.  Do not take baths for 2-4 weeks after surgery. Take showers once your health care provider approves. Pat incisions dry. Do not rub incisions with a washcloth or towel.  Return to work as directed by your health care provider.  Drink enough fluids to keep your urine clear or pale yellow.  Do not strain to have a bowel movement. Eat high-fiber foods if you become constipated. You may also take a medicine to help you have a bowel movement (laxative) as directed by your health care  provider.  Resume sexual activity as directed by your health care provider. SEEK MEDICAL CARE IF:   You develop a skin rash.   Your weight is increasing each day over 2-3 days.  Your weight increases by 2 or more pounds (1 kg) in a single day.  You have a fever. SEEK IMMEDIATE MEDICAL CARE IF:   You develop chest pain that is not coming from your incision.  You develop shortness of breath or difficulty breathing.  You have drainage, redness, swelling, or pain at your incision site.  You have pus coming from your incision.  You develop light-headedness. MAKE SURE YOU:  Understand these directions.  Will watch your condition.  Will get help right away if you are not doing well or get worse.   This information is not intended to replace advice given to you by your health care provider. Make sure you discuss any questions you have with your health care provider.   Document Released: 04/27/2005 Document Revised: 10/29/2014 Document Reviewed: 03/10/2013 Elsevier Interactive Patient Education 2016 [place] on my medicine - Coumadin   (Warfarin)  This medication education was reviewed with me or my healthcare representative as part of my discharge preparation.  The pharmacist that spoke with me during my hospital stay was:  Wayland Salinas, [facility]  Why was Coumadin prescribed for you? Coumadin was prescribed for you because you have a blood clot or a medical condition that can cause an increased risk of forming blood  clots. Blood clots can cause serious health problems by blocking the flow of blood to the heart, lung, or brain. Coumadin can prevent harmful blood clots from forming. As a reminder your indication for Coumadin is:   Stroke Prevention Because Of Atrial Fibrillation  What test will check on my response to Coumadin? While on Coumadin (warfarin) you will need to have an INR test regularly to ensure that your dose is keeping you in the  desired range. The INR (international normalized ratio) number is calculated from the result of the laboratory test called prothrombin time (PT).  If an INR APPOINTMENT HAS NOT ALREADY BEEN MADE FOR YOU please schedule an appointment to have this lab work done by your health care provider within 7 days. Your INR goal is usually a number between:  2 to 3 or your provider may give you a more narrow range like 2-2.5.  Ask your health care provider during an office visit what your goal INR is.  What  do you need to  know  About  COUMADIN? Take Coumadin (warfarin) exactly as prescribed by your healthcare provider about the same time each day.  DO NOT stop taking without talking to the doctor who prescribed the medication.  Stopping without other blood clot prevention medication to take the place of Coumadin may increase your risk of developing a new clot or stroke.  Get refills before you run out.  What do you do if you miss a dose? If you miss a dose, take it as soon as you remember on the same day then continue your regularly scheduled regimen the next day.  Do not take two doses of Coumadin at the same time.  Important Safety Information A possible side effect of Coumadin (Warfarin) is an increased risk of bleeding. You should call your healthcare provider right away if you experience any of the following: ? Bleeding from an injury or your nose that does not stop. ? Unusual colored urine (red or dark brown) or unusual colored stools (red or black). ? Unusual bruising for unknown reasons. ? A serious fall or if you hit your head (even if there is no bleeding).  Some foods or medicines interact with Coumadin (warfarin) and might alter your response to warfarin. To help avoid this: ? Eat a balanced diet, maintaining a consistent amount of Vitamin K. ? Notify your provider about major diet changes you plan to make. ? Avoid alcohol or limit your intake to 1 drink for women and 2 drinks for men per  day. (1 drink is 5 oz. wine, 12 oz. beer, or 1.5 oz. liquor.)  Make sure that ANY health care provider who prescribes medication for you knows that you are taking Coumadin (warfarin).  Also make sure the healthcare provider who is monitoring your Coumadin knows when you have started a new medication including herbals and non-prescription products.  Coumadin (Warfarin)  Major Drug Interactions  Increased Warfarin Effect Decreased Warfarin Effect  Alcohol (large quantities) Antibiotics (esp. Septra/Bactrim, Flagyl, Cipro) Amiodarone (Cordarone) Aspirin (ASA) Cimetidine (Tagamet) Megestrol (Megace) NSAIDs (ibuprofen, naproxen, etc.) Piroxicam (Feldene) Propafenone (Rythmol SR) Propranolol (Inderal) Isoniazid (INH) Posaconazole (Noxafil) Barbiturates (Phenobarbital) Carbamazepine (Tegretol) Chlordiazepoxide (Librium) Cholestyramine (Questran) Griseofulvin Oral Contraceptives Rifampin Sucralfate (Carafate) Vitamin K   Coumadin (Warfarin) Major Herbal Interactions  Increased Warfarin Effect Decreased Warfarin Effect  Garlic Ginseng Ginkgo biloba Coenzyme Q10 Green tea St. Johns wort    Coumadin (Warfarin) FOOD Interactions  Eat a consistent number of servings per week of foods HIGH  in Vitamin K (1 serving =  cup)  Collards (cooked, or boiled & drained) Kale (cooked, or boiled & drained) Mustard greens (cooked, or boiled & drained) Parsley *serving size only =  cup Spinach (cooked, or boiled & drained) Swiss chard (cooked, or boiled & drained) Turnip greens (cooked, or boiled & drained)  Eat a consistent number of servings per week of foods MEDIUM-HIGH in Vitamin K (1 serving = 1 cup)  Asparagus (cooked, or boiled & drained) Broccoli (cooked, boiled & drained, or raw & chopped) Brussel sprouts (cooked, or boiled & drained) *serving size only =  cup Lettuce, raw (green leaf, endive, romaine) Spinach, raw Turnip greens, raw & chopped   These websites have more  information on Coumadin (warfarin):  FailFactory.se; VeganReport.com.au;

## 2016-01-09 ENCOUNTER — Inpatient Hospital Stay (HOSPITAL_COMMUNITY): Payer: Medicare Other

## 2016-01-09 LAB — PROTIME-INR
INR: 2.02 — AB (ref 0.00–1.49)
PROTHROMBIN TIME: 22.8 s — AB (ref 11.6–15.2)

## 2016-01-09 MED ORDER — FUROSEMIDE 40 MG PO TABS
40.0000 mg | ORAL_TABLET | Freq: Two times a day (BID) | ORAL | Status: DC
  Administered 2016-01-09 – 2016-01-10 (×3): 40 mg via ORAL
  Filled 2016-01-09 (×3): qty 1

## 2016-01-09 MED ORDER — POTASSIUM CHLORIDE CRYS ER 20 MEQ PO TBCR
40.0000 meq | EXTENDED_RELEASE_TABLET | Freq: Once | ORAL | Status: AC
  Administered 2016-01-09: 40 meq via ORAL
  Filled 2016-01-09: qty 2

## 2016-01-09 MED ORDER — LISINOPRIL 5 MG PO TABS
5.0000 mg | ORAL_TABLET | Freq: Every day | ORAL | Status: DC
  Administered 2016-01-09: 5 mg via ORAL
  Filled 2016-01-09: qty 1

## 2016-01-09 NOTE — Progress Notes (Signed)
Pt. needs 2L to walk with SATs drop to high 80's. At rest Pt. Is on 1L [place]. Will report to nurse to wean off completely tomorrow.

## 2016-01-09 NOTE — Progress Notes (Signed)
Removed patients ON-Q. No resistance, pt. tolerated with no pain.

## 2016-01-09 NOTE — Progress Notes (Signed)
CARDIAC REHAB PHASE I   PRE:  Rate/Rhythm: 71 SR c/ PACs  BP:  Sitting: 116/83        SaO2: 90% 1L  MODE:  Ambulation: 320 ft   POST:  Rate/Rhythm: 124 a fib  BP:  Sitting: 103/68         SaO2: 88% on 1L, 89-90% on 2 L c/ ambulation, 93% on 1L after 2 minutes rest  Pt states he is concerned he is having trouble remembering things. Pt stood with minimal assistance, ambulated 70 ft on 1L O2, then abruptly requested to return to his room to use the restroom. Pt then ambulated an additional 250 ft on 2L O2, gait belt, slow, fairly steady gait, tolerated well, mild DOE, fatigue, pt denies any other complaints. Pt sats 88% on 1L O2 while ambulating, increased to 2L O2, sats 89-90% on 2L O2 while ambulating. Upon return to room, pt O2 decreased to 1L after 2 minutes rest, sats 93%. Additionally, upon returning to room, pt HR increased to 110-124 a fib, RN notified. Pt to recliner after walk, feet elevated, call bell within reach. Encouraged increased ambulation, IS. Will follow.  [id number] Lenna Sciara, RN, BSN 01/09/2016 10:09 AM

## 2016-01-09 NOTE — Progress Notes (Addendum)
      [address]       [place] [address]             [phone]        5 Days Post-Op Procedure(s) (LRB): MINIMALLY INVASIVE MITRAL VALVE REPAIR (MVR) (Right) TRANSESOPHAGEAL ECHOCARDIOGRAM (TEE) (N/A) PATENT FORAMEN OVALE CLOSURE (N/A) CLIPPING OF ATRIAL APPENDAGE (N/A)  Subjective: Patient just returned from x ray.   Objective: Vital signs in last 24 hours: Temp:  [98.6 F (37 C)-99.2 F (37.3 C)] 99 F (37.2 C) (03/20 0351) Pulse Rate:  [66-73] 73 (03/20 0351) Cardiac Rhythm:  [-] Normal sinus rhythm (03/19 1942) Resp:  [18-19] 18 (03/20 0351) BP: (113-136)/(56-67) 136/66 mmHg (03/20 0351) SpO2:  [92 %-99 %] 92 % (03/20 0351) Weight:  [162 lb 0.6 oz (73.5 kg)] 162 lb 0.6 oz (73.5 kg) (03/20 0351)   Current Weight  01/09/16 162 lb 0.6 oz (73.5 kg)      Intake/Output from previous day: 03/19 0701 - 03/20 0700 In: 6 [I.V.:6] Out: 1520 [Urine:1400; Chest Tube:120]   Physical Exam:  Cardiovascular: RRR Pulmonary: Slightly diminished right base and left lung is clear; no rales, wheezes, or rhonchi. Abdomen: Soft, non tender, bowel sounds present. Extremities: Trace bilateral lower extremity edema. Wounds: Clean and dry.  No erythema or signs of infection.  Lab Results: CBC: Recent Labs  01/07/16 0350 01/08/16 0409  WBC 7.8 6.9  HGB 9.1* 9.3*  HCT 28.1* 28.7*  PLT 101* 121*   BMET:  Recent Labs  01/07/16 0350 01/08/16 0409  NA 139 138  K 4.1 3.9  CL 105 107  CO2 26 24  GLUCOSE 96 88  BUN 23* 15  CREATININE 1.22 1.01  CALCIUM 7.9* 7.9*    PT/INR:  Lab Results  Component Value Date   INR 2.02* 01/09/2016   INR 2.06* 01/08/2016   INR 1.81* 01/07/2016   ABG:  INR: Will add last result for INR, ABG once components are confirmed Will add last 4 CBG results once components are confirmed  Assessment/Plan:  1. CV - SR in the 70's. On Amiodarone 200 mg daily, Lisinopril 5 mg daily, and Coumadin. INR ncreased from 2.02  to 2.56. BP is labile in low 100's so will decrease Lisinopril to 2.5 mg. 2.  Pulmonary - On 1 liter of oxygen via [place]. CXR this am appears stable. Encourage incentive spirometer and flutter valve 3.  Acute blood loss anemia - Last H and H 9.3 and 28.7 4. Mild thrombocytopenia-last platelets up to 121,000 5. Supplement potassium 6. Possibly discharge later today with home oxygen  Samuel [name],DONIELLE MPA-C 01/09/2016,7:21 AM

## 2016-01-09 NOTE — Care Management Important Message (Signed)
Important Message  Patient Details  Name: Samuel [name] MRN: [id number] Date of Birth: [birth year]   Medicare Important Message Given:  Yes    Miray Mancino P Marilyn Nihiser 01/09/2016, 3:28 PM

## 2016-01-10 ENCOUNTER — Inpatient Hospital Stay (HOSPITAL_COMMUNITY): Payer: Medicare Other

## 2016-01-10 ENCOUNTER — Ambulatory Visit (HOSPITAL_COMMUNITY): Payer: Medicare Other

## 2016-01-10 DIAGNOSIS — I340 Nonrheumatic mitral (valve) insufficiency: Secondary | ICD-10-CM

## 2016-01-10 LAB — COMPREHENSIVE METABOLIC PANEL
ALBUMIN: 2.2 g/dL — AB (ref 3.5–5.0)
ALK PHOS: 124 U/L (ref 38–126)
ALT: 51 U/L (ref 17–63)
AST: 33 U/L (ref 15–41)
Anion gap: 10 (ref 5–15)
BUN: 14 mg/dL (ref 6–20)
CALCIUM: 8.3 mg/dL — AB (ref 8.9–10.3)
CO2: 26 mmol/L (ref 22–32)
CREATININE: 1.18 mg/dL (ref 0.61–1.24)
Chloride: 100 mmol/L — ABNORMAL LOW (ref 101–111)
GFR calc Af Amer: >60 mL/min (ref >60)
GFR calc non Af Amer: 56 mL/min — ABNORMAL LOW (ref >60)
GLUCOSE: 92 mg/dL (ref 65–99)
Potassium: 3.9 mmol/L (ref 3.5–5.1)
SODIUM: 136 mmol/L (ref 135–145)
Total Bilirubin: 0.8 mg/dL (ref 0.3–1.2)
Total Protein: 5 g/dL — ABNORMAL LOW (ref 6.5–8.1)

## 2016-01-10 LAB — BRAIN NATRIURETIC PEPTIDE: B Natriuretic Peptide: 446.5 pg/mL — ABNORMAL HIGH (ref 0.0–100.0)

## 2016-01-10 LAB — PREALBUMIN: Prealbumin: 12.3 mg/dL — ABNORMAL LOW (ref 18–38)

## 2016-01-10 LAB — PROTIME-INR
INR: 2.56 — ABNORMAL HIGH (ref 0.00–1.49)
Prothrombin Time: 27.1 seconds — ABNORMAL HIGH (ref 11.6–15.2)

## 2016-01-10 LAB — ECHOCARDIOGRAM COMPLETE: Weight: 2550.28 oz

## 2016-01-10 MED ORDER — TRAMADOL HCL 50 MG PO TABS: 50.0000 mg | ORAL_TABLET | Freq: Four times a day (QID) | ORAL | Status: DC | PRN

## 2016-01-10 MED ORDER — LISINOPRIL 2.5 MG PO TABS
2.5000 mg | ORAL_TABLET | Freq: Every day | ORAL | Status: DC
  Administered 2016-01-10: 2.5 mg via ORAL
  Filled 2016-01-10: qty 1

## 2016-01-10 MED ORDER — FUROSEMIDE 40 MG PO TABS: 40.0000 mg | ORAL_TABLET | Freq: Every day | ORAL | Status: DC

## 2016-01-10 MED ORDER — POTASSIUM CHLORIDE ER 20 MEQ PO TBCR: 10.0000 meq | EXTENDED_RELEASE_TABLET | Freq: Every day | ORAL | Status: DC

## 2016-01-10 MED ORDER — WARFARIN SODIUM 2 MG PO TABS: 2.0000 mg | ORAL_TABLET | Freq: Every day | ORAL | Status: DC

## 2016-01-10 MED ORDER — LISINOPRIL 2.5 MG PO TABS: 2.5000 mg | ORAL_TABLET | Freq: Every day | ORAL | Status: DC

## 2016-01-10 NOTE — Clinical Social Work Placement (Signed)
   CLINICAL SOCIAL WORK PLACEMENT  NOTE  Date:  01/10/2016  Patient Details  Name: Samuel [name] MRN: [id number] Date of Birth: September 02, 1932  Clinical Social Work is seeking post-discharge placement for this patient at the [place] level of care (*CSW will initial, date and re-position this form in  chart as items are completed):  Yes   Patient/family provided with [place] Work Department's list of facilities offering this level of care within the geographic area requested by the patient (or if unable, by the patient's family).  Yes   Patient/family informed of their freedom to choose among providers that offer the needed level of care, that participate in Medicare, Medicaid or managed care program needed by the patient, have an available bed and are willing to accept the patient.  Yes   Patient/family informed of [facility]'s ownership interest in [facility] and [facility], as well as of the fact that they are under no obligation to receive care at these facilities.  PASRR submitted to EDS on 01/10/16     PASRR number received on 01/10/16     Existing PASRR number confirmed on       FL2 transmitted to all facilities in geographic area requested by pt/family on 01/10/16     FL2 transmitted to all facilities within larger geographic area on       Patient informed that his/her managed care company has contracts with or will negotiate with certain facilities, including the following:        Yes   Patient/family informed of bed offers received.  Patient chooses bed at [facility]     Physician recommends and patient chooses bed at      Patient to be transferred to [facility] on 01/10/16.  Patient to be transferred to facility by [facility] transportation     Patient family notified on 01/10/16 of transfer.  Name of family member notified:  N/A Patient oriented     PHYSICIAN       Additional Comment:     _______________________________________________ Benard Halsted, [place] 01/10/2016, 1:59 PM

## 2016-01-10 NOTE — Progress Notes (Signed)
Patient will DC to: [facility] Anticipated DC date: 01/10/16 Family notified: N/A Transport by: [facility] transportation 2:15pm  Patient requested that he discharge by [facility] transportation and stated he does not need oxygen for the trip.  CSW signing off.  Cedric Fishman, [place] Social Worker [phone]

## 2016-01-10 NOTE — Progress Notes (Addendum)
      [address]       [place] [address]             [phone]        6 Days Post-Op Procedure(s) (LRB): MINIMALLY INVASIVE MITRAL VALVE REPAIR (MVR) (Right) TRANSESOPHAGEAL ECHOCARDIOGRAM (TEE) (N/A) PATENT FORAMEN OVALE CLOSURE (N/A) CLIPPING OF ATRIAL APPENDAGE (N/A)  Subjective: Patient just returned from x ray.   Objective: Vital signs in last 24 hours: Temp:  [97.8 F (36.6 C)-98.7 F (37.1 C)] 97.8 F (36.6 C) (03/21 0423) Pulse Rate:  [75-124] 79 (03/21 0423) Cardiac Rhythm:  [-] Normal sinus rhythm (03/20 2200) Resp:  [18] 18 (03/21 0423) BP: (97-107)/(59-71) 107/59 mmHg (03/21 0423) SpO2:  [92 %-98 %] 92 % (03/21 0423) Weight:  [159 lb 6.3 oz (72.3 kg)] 159 lb 6.3 oz (72.3 kg) (03/21 0423)   Current Weight  01/10/16 159 lb 6.3 oz (72.3 kg)      Intake/Output from previous day: 03/20 0701 - 03/21 0700 In: 840 [P.O.:840] Out: 1100 [Urine:1100]   Physical Exam:  Cardiovascular: RRR Pulmonary: Slightly diminished right base and left lung is clear; no rales, wheezes, or rhonchi. Abdomen: Soft, non tender, bowel sounds present. Extremities: Trace bilateral lower extremity edema. Wounds: Clean and dry.  No erythema or signs of infection.  Lab Results: CBC:  Recent Labs  01/08/16 0409  WBC 6.9  HGB 9.3*  HCT 28.7*  PLT 121*   BMET:   Recent Labs  01/08/16 0409 01/10/16 0300  NA 138 136  K 3.9 3.9  CL 107 100*  CO2 24 26  GLUCOSE 88 92  BUN 15 14  CREATININE 1.01 1.18  CALCIUM 7.9* 8.3*    PT/INR:  Lab Results  Component Value Date   INR 2.56* 01/10/2016   INR 2.02* 01/09/2016   INR 2.06* 01/08/2016   ABG:  INR: Will add last result for INR, ABG once components are confirmed Will add last 4 CBG results once components are confirmed  Assessment/Plan:  1. CV - Occasional a fib with HR into 120' but mostly in SR in the 70's. On Amiodarone 200 mg daily, Lisinopril 5 mg daily, and Coumadin. INR ncreased from  2.02 to 2.56 so will continue with Coumadin 2 mg daily.BP is labile in low 100's so will decrease Lisinopril to 2.5 mg. 2.  Pulmonary - On 1 liter of oxygen via [place]. CXR this am appears stable. Encourage incentive spirometer and flutter valve 3.  Acute blood loss anemia - Last H and H 9.3 and 28.7 4. Mild thrombocytopenia-last platelets up to 121,000 5. Supplement potassium 6. Possibly discharge later today with home oxygen  ZIMMERMAN,DONIELLE MPA-C 01/10/2016,8:07 AM

## 2016-01-10 NOTE — Care Management Note (Signed)
Case Management Note Previous CM note initiated by Elenor Quinones RN, CM  Patient Details  Name: Roi Hjort MRN: [id number] Date of Birth: [birth year]  Subjective/Objective:     S/p mini MVR              Action/Plan:  Pt is from [facility] with wife.  Wife will provide 24 hour supervision as recommended.  CM will continue to monitor for disposition needs   Expected Discharge Date:    01/10/16              Expected Discharge Plan:  [place]  In-House Referral:  Clinical Social Work  Discharge planning Services  CM Consult  Post Acute Care Choice:    Choice offered to:     DME Arranged:    DME Agency:     HH Arranged:    [place] Agency:     Status of Service:  Completed, signed off  Medicare Important Message Given:  Yes Date Medicare IM Given:    Medicare IM give by:    Date Additional Medicare IM Given:    Additional Medicare Important Message give by:     If discussed at [place] of Stay Meetings, dates discussed:    Discharge Disposition: Skilled Facility   Additional Comments:  01/10/16- Per pt he reports that his plan is to return to [facility]- rehab unit- CSW aware and working on d/c to [facility] SNF bed- orders where placed for home 02 and [place]- will not make any referrals as pt to discharge to STSNF at [facility], Romeo Rabon, RN 01/10/2016, 2:43 PM

## 2016-01-10 NOTE — Clinical Social Work Note (Signed)
Clinical Social Work Assessment  Patient Details  Name: Samuel [name] MRN: [id number] Date of Birth: Oct 31, 1931  Date of referral:  01/10/16               Reason for consult:  Facility Placement                Permission sought to share information with:  Facility Sport and exercise psychologist, Family Supports Permission granted to share information::  Yes, Verbal Permission Granted  Name::     [name]  Agency::  [facility]  Relationship::  Spouse  Contact Information:  [phone]  Housing/Transportation Living arrangements for the past 2 months:  [place] of Information:  Patient Patient Interpreter Needed:  None Criminal Activity/Legal Involvement Pertinent to Current Situation/Hospitalization:  No - Comment as needed Significant Relationships:  Spouse Lives with:  Spouse Do you feel safe going back to the place where you live?  No Need for family participation in patient care:  No (Coment)  Care giving concerns:  CSW received referral for possible SNF placement at time of discharge. CSW met with patient to discuss SNF options. Patient is from [place] but would like to go to [facility].  Social Worker assessment / plan:  CSW spoke with patient concerning possibility of rehab at [facility] before returning home.  Employment status:  Retired Forensic scientist:  Medicare PT Recommendations:  Not assessed at this time [place] / Referral to community resources:  [place]  Patient/Family's Response to care:  Patient recognizes need for rehab before returning home and is agreeable to [facility]. Patient is very pleasant and hopeful that he will get stronger on his new oxygen. Patient would like to have his clothes brought to the hospital before he leaves. Facility is aware.  Patient/Family's Understanding of and Emotional Response to Diagnosis, Current Treatment, and Prognosis:  Patient is realistic regarding therapy  needs. No questions/concerns about plan or treatment.    Emotional Assessment Appearance:  Appears stated age Attitude/Demeanor/Rapport:   (Appropriate) Affect (typically observed):  Accepting, Appropriate, Pleasant Orientation:  Oriented to Self, Oriented to Place, Oriented to  Time, Oriented to Situation Alcohol / Substance use:  Not Applicable Psych involvement (Current and /or in the community):  No (Comment)  Discharge Needs  Concerns to be addressed:  Care Coordination Readmission within the last 30 days:  No Current discharge risk:  None Barriers to Discharge:  No Barriers Identified   Benard Halsted, [place] 01/10/2016, 1:54 PM

## 2016-01-10 NOTE — Progress Notes (Signed)
CARDIAC REHAB PHASE I   PRE:  Rate/Rhythm: 72 SR  BP:  Sitting: 114/64        SaO2: 91 1 L  MODE:  Ambulation: 450 ft   POST:  Rate/Rhythm: 83 SR  BP:  Sitting: 115/63         SaO2: 91 1L  Pt states he is hoping to discharge to [facility] SNF today, CSW aware. Pt ambulated 450 ft on 1L O2, gait belt, hand held assist, fairly steady gait, tolerated well. Cardiac surgery discharge education completed. Reviewed IS, restrictions, activity progression, exercise, heart healthy diet, sodium restrictions, and phase 2 cardiac rehab. Pt verbalized understanding. Pt agrees to phase 2 cardiac rehab referral, will send to [facility]. Pt to recliner after walk, call bell within reach.   [id number] Lenna Sciara, RN, BSN 01/10/2016 11:33 AM

## 2016-01-10 NOTE — Progress Notes (Signed)
  Echocardiogram 2D Echocardiogram has been performed.  Diamond Nickel 01/10/2016, 9:46 AM

## 2016-01-10 NOTE — NC FL2 (Signed)
[place] LEVEL OF CARE SCREENING TOOL     IDENTIFICATION  Patient Name: Samuel [name] Birthdate: 05/15/1932 Sex: male Admission Date (Current Location): 01/04/2016  [facility] and [place] Number:  Herbalist and Address:  [facility], [place] [address], [place], [place] [address]      Provider Number: [id number]  Attending Physician Name and Address:  Rexene Alberts, MD  Relative Name and Phone Number:  Elon Spanner, [phone]    Current Level of Care: Hospital Recommended Level of Care: [place] Prior Approval Number:    Date Approved/Denied:   PASRR Number: [id number]  Discharge Plan: SNF    Current Diagnoses: Patient Active Problem List   Diagnosis Date Noted  . S/P minimally invasive mitral valve repair 01/04/2016  . Paroxysmal atrial fibrillation ([place]) 01/04/2016  . Patent foramen ovale 01/04/2016  . Mitral regurgitation 10/07/2015  . Carotid artery disease ([place])   . Hypothyroidism 08/27/2011  . Malignant neoplasm of prostate ([place]) 06/21/2010  . Hyperlipidemia LDL goal <100 06/21/2010  . Essential hypertension, benign 06/21/2010  . Contracture of palmar fascia 06/21/2010  . Enthesopathy of hip region 11/27/2009    Orientation RESPIRATION BLADDER Height & Weight     Self, Time, Situation, Place  O2 (1L) Continent Weight: 72.3 kg (159 lb 6.3 oz) Height:     BEHAVIORAL SYMPTOMS/MOOD NEUROLOGICAL BOWEL NUTRITION STATUS      Continent  (Please see DC summary)  AMBULATORY STATUS COMMUNICATION OF NEEDS Skin   Limited Assist Verbally Surgical wounds (Closed incision on chest)                       Personal Care Assistance Level of Assistance  Bathing, Feeding, Dressing Bathing Assistance: Limited assistance Feeding assistance: Independent Dressing Assistance: Limited assistance     Functional Limitations Info             SPECIAL CARE FACTORS FREQUENCY                        Contractures      Additional Factors Info  Code Status, Allergies Code Status Info: Full Allergies Info: NKA           Current Medications (01/10/2016):  This is the current hospital active medication list Current Facility-Administered Medications  Medication Dose Route Frequency Provider Last Rate Last Dose  . 0.9 %  sodium chloride infusion  250 mL Intravenous PRN Rexene Alberts, MD      . alfuzosin (UROXATRAL) 24 hr tablet 10 mg  10 mg Oral Q breakfast Rexene Alberts, MD   10 mg at 01/10/16 0824  . amiodarone (PACERONE) tablet 200 mg  200 mg Oral Daily Rexene Alberts, MD   200 mg at 01/10/16 1021  . antiseptic oral rinse (CPC / CETYLPYRIDINIUM CHLORIDE 0.05%) solution 7 mL  7 mL Mouth Rinse BID Rexene Alberts, MD   7 mL at 01/10/16 1000  . aspirin EC tablet 81 mg  81 mg Oral Daily Rexene Alberts, MD   81 mg at 01/10/16 1021  . bisacodyl (DULCOLAX) EC tablet 10 mg  10 mg Oral Daily Rexene Alberts, MD   10 mg at 01/09/16 1017   Or  . bisacodyl (DULCOLAX) suppository 10 mg  10 mg Rectal Daily Rexene Alberts, MD      . diphenhydrAMINE (BENADRYL) capsule 25 mg  25 mg Oral Q8H PRN Rexene Alberts,  MD   25 mg at 01/09/16 2228  . docusate sodium (COLACE) capsule 200 mg  200 mg Oral Daily Rexene Alberts, MD   200 mg at 01/09/16 1018  . furosemide (LASIX) tablet 40 mg  40 mg Oral BID Nani Skillern, PA-C   40 mg at 01/10/16 0824  . levothyroxine (SYNTHROID, LEVOTHROID) tablet 50 mcg  50 mcg Oral QAC breakfast Rexene Alberts, MD   50 mcg at 01/10/16 [phone]  . lisinopril (PRINIVIL,ZESTRIL) tablet 2.5 mg  2.5 mg Oral Daily Donielle Liston Alba, PA-C   2.5 mg at 01/10/16 1021  . metoprolol (LOPRESSOR) injection 2.5-5 mg  2.5-5 mg Intravenous Q2H PRN Rexene Alberts, MD      . ondansetron [facility]) injection 4 mg  4 mg Intravenous Q6H PRN Rexene Alberts, MD   4 mg at 01/05/16 0238  . oxyCODONE (Oxy IR/ROXICODONE) immediate release tablet 5-10 mg  5-10 mg Oral Q3H PRN Rexene Alberts, MD   10 mg at 01/05/16 2216  . pantoprazole (PROTONIX) EC tablet 40 mg  40 mg Oral Daily Rexene Alberts, MD   40 mg at 01/10/16 1021  . promethazine (PHENERGAN) injection 12.5 mg  12.5 mg Intravenous Q6H PRN Rexene Alberts, MD   12.5 mg at 01/05/16 0904  . simvastatin (ZOCOR) tablet 20 mg  20 mg Oral q1800 Rexene Alberts, MD   20 mg at 01/09/16 1749  . sodium chloride flush (NS) 0.9 % injection 3 mL  3 mL Intravenous Q12H Rexene Alberts, MD   3 mL at 01/10/16 1000  . sodium chloride flush (NS) 0.9 % injection 3 mL  3 mL Intravenous PRN Rexene Alberts, MD      . sodium chloride flush (NS) 0.9 % injection 3 mL  3 mL Intravenous Q12H Rexene Alberts, MD   3 mL at 01/09/16 1020  . sodium chloride flush (NS) 0.9 % injection 3 mL  3 mL Intravenous PRN Rexene Alberts, MD      . traMADol Veatrice Bourbon) tablet 50-100 mg  50-100 mg Oral Q4H PRN Rexene Alberts, MD   50 mg at 01/08/16 0804  . warfarin (COUMADIN) tablet 2 mg  2 mg Oral q1800 Nani Skillern, PA-C      . Warfarin - Physician Dosing Inpatient   Does not apply [id number] Rexene Alberts, MD   0  at 01/05/16 1800     Discharge Medications: Please see discharge summary for a list of discharge medications.  Relevant Imaging Results:  Relevant Lab Results:   Additional Information SSN: [place], [place]

## 2016-01-10 NOTE — Progress Notes (Signed)
RN notified by CCMD patient converted to NSR.

## 2016-01-11 ENCOUNTER — Non-Acute Institutional Stay (SKILLED_NURSING_FACILITY): Payer: Medicare Other | Admitting: Internal Medicine

## 2016-01-11 DIAGNOSIS — I480 Paroxysmal atrial fibrillation: Secondary | ICD-10-CM | POA: Diagnosis not present

## 2016-01-11 DIAGNOSIS — M6281 Muscle weakness (generalized): Secondary | ICD-10-CM | POA: Diagnosis not present

## 2016-01-11 DIAGNOSIS — I10 Essential (primary) hypertension: Secondary | ICD-10-CM | POA: Diagnosis not present

## 2016-01-11 DIAGNOSIS — E039 Hypothyroidism, unspecified: Secondary | ICD-10-CM

## 2016-01-11 DIAGNOSIS — Q211 Atrial septal defect: Secondary | ICD-10-CM | POA: Diagnosis not present

## 2016-01-11 DIAGNOSIS — Z9889 Other specified postprocedural states: Secondary | ICD-10-CM

## 2016-01-11 DIAGNOSIS — I340 Nonrheumatic mitral (valve) insufficiency: Secondary | ICD-10-CM

## 2016-01-11 DIAGNOSIS — R2689 Other abnormalities of gait and mobility: Secondary | ICD-10-CM | POA: Diagnosis not present

## 2016-01-11 DIAGNOSIS — R2681 Unsteadiness on feet: Secondary | ICD-10-CM | POA: Diagnosis not present

## 2016-01-11 DIAGNOSIS — Q2112 Patent foramen ovale: Secondary | ICD-10-CM

## 2016-01-11 DIAGNOSIS — Z954 Presence of other heart-valve replacement: Secondary | ICD-10-CM | POA: Diagnosis not present

## 2016-01-11 DIAGNOSIS — R278 Other lack of coordination: Secondary | ICD-10-CM | POA: Diagnosis not present

## 2016-01-11 NOTE — Progress Notes (Signed)
Patient ID: Samuel [name], male   DOB: Aug 17, 1932, 82 y.o.   MRN: [id number]  Provider:  Rexene Edison. Mariea Clonts, D.O., C.M.D. Location:  Occupational psychologist of Service:  SNF (31) [facility]  PCP: [name], DO Patient Care Team: Gayland Curry, DO as PCP - General (Geriatric Medicine) [facility] Jeri Cos, NP as Nurse Practitioner (Geriatric Medicine) [name], MD as Consulting Physician (Cardiology) Raynelle Bring, MD as Consulting Physician (Urology)  Extended Emergency Contact Information Primary Emergency Contact: Armstrong,Joan Address: [address]          [place], [place] [address] Johnnette Litter of [place] Phone: [phone] Relation: Spouse  Code Status: full code Goals of Care: Advanced Directive information Advanced Directives 02/08/2016  Does patient have an advance directive? Yes  Type of Advance Directive Living will;Healthcare Power of Attorney  Copy of advanced directive(s) in chart? Yes   Chief Complaint  Patient presents with  . New Admit To SNF    s/p mitral valve repair, PFO closure    HPI: Patient is a 82 y.o. male with h/o htn, prostate ca and distant spell of afib seen today for admission to [facility] rehab s/p hospitalization for mitral valve repair and PFO closure.  He had been doing very well, swimming regularly and overall in good health. He saw cardiology and was noted to have a new, prominent murmur that "sounded like a washing machine"--mitral valve prolapse and severe MR were discovered.  His echocardiogram revealed a flail valve and this was in need of repair.  He was admitted 3/15 to 3/21 for this surgery.  He underwent a minimally invasive mitral valve repair (complex valvuloplasty including triangular resection of flail segment of posterior leaflet artificial gore-tex neochord placement x 6, sorin memo 3D annuloplasty (size 8mm, catalog # [id number], serial [id number]), clipping of left atrial  appendage and closture of PFO by Dr. Roxy Manns on 01/04/16.    Per d/c summary:  "The patient was extubated later the evening of surgery without difficulty. He remained afebrile and hemodynamically stable. He was initially AAI paced and was weaned off of Neo Synephrine drip. Gordy Councilman, a line, chest tubes, and foley were removed early in the post operative course. He was volume over loaded and diuresed. He had ABL anemia. He did not require a post op transfusion. His last H and H was 9.3 and 28.7. He also had mild thrombocytopenia. His last platelet count was 121,000. He was weaned off the insulin drip. The patient's HGA1C pre op was 5.5. He was started on Coumadin and his PT and INR were monitored daily. His last INR was 2.56. He will be discharged on Coumadin 2 mg. The patient was felt surgically stable for transfer from the ICU to PCTU for further convalescence on 01/06/2016. He continues to progress with cardiac rehab. He initially required 3 liters of oxygen via [place]. He was weaned to 1 liter of oxygen via [place]. CXR on 03/21 showed low lung volumes, small, stable right pneumothorax, right chest wall emphysema, bibasilar atelectasis R>L, and small left pleural effusion. He will need to continue using his incentive spirometer and flutter valve. He has been tolerating a diet and has had a bowel movement. Epicardial pacing wires were removed prior to chest tubes. Chest tube sutures will be removed by [facility] after discharge on 01/18/2016. Dr. Roxy Manns reviewed his 2D echo (LVEF 60-65%, mild mitral and tricuspid regurgitation, and no pericardial effusion)and felt the patient is felt surgically stable  for discharge today."  When seen, he was feeling well.  He walked [facility] van into rehab.  Aside from some soreness in his chest, mild weakness and pallor, he was feeling just fine.    Past Medical History  Diagnosis Date  . Pain in joint, lower leg 02/25/2012  . Unspecified hypothyroidism 08/27/2011  .  Palpitations 08/14/2010  . Cortical senile cataract 06/27/2010  . Malignant neoplasm of prostate ([place]) 06/21/2010  . Other and unspecified hyperlipidemia 06/21/2010  . Unspecified essential hypertension 06/21/2010  . Unspecified constipation 06/21/2010  . Hypertrophy of prostate without urinary obstruction and other lower urinary tract symptoms (LUTS) 06/21/2010  . Osteoarthrosis, unspecified whether generalized or localized, unspecified site 06/21/2010  . Contracture of palmar fascia 06/21/2010  . Enthesopathy of hip region 11/27/2009  . Urinary frequency 08/14/2008  . Nocturia 2011  . Mitral regurgitation 10/07/2015    Severe MR noted on ECHO with partially flail post leaflet Oct 03 2015   . Essential hypertension, benign 06/21/2010  . Hypothyroidism 08/27/2011  . Hyperlipidemia LDL goal <100 06/21/2010  . Complication of anesthesia 1955    sodium pentathol ?  Marland Kitchen Dysrhythmia   . Carotid artery disease ([place])   . Aortoiliac occlusive disease ([place])   . GERD (gastroesophageal reflux disease)     occ  . Patent foramen ovale 01/04/2016    Closed at the time of mitral valve repair   . S/P minimally invasive mitral valve repair 01/04/2016    Complex valvuloplasty including triangular resection of flail segment of posterior leaflet, artificial Gore-tex neochord placement x6 and 30mm Sorin Memo 3D ring annuloplasty via right mini thoracotomy approach with closure of PFO and clipping of LA appendage    Past Surgical History  Procedure Laterality Date  . Total knee arthroplasty Right 1955    Dr. Ethel Rana  . Knee arthroscopy with meniscal repair Left 1968  . Cataract extraction w/ intraocular lens  implant, bilateral  2006  . Cardiac catheterization N/A 11/10/2015    Procedure: Right/Left Heart Cath and Coronary Angiography;  Surgeon: Peter M Martinique, MD;  Location: [facility];  Service: Cardiovascular;  Laterality: N/A;  . Tee without cardioversion N/A 11/10/2015    Procedure: TRANSESOPHAGEAL  ECHOCARDIOGRAM (TEE);  Surgeon: Skeet Latch, MD;  Location: [facility];  Service: Cardiovascular;  Laterality: N/A;  . Prostate surgery  2000    seed implants  . Mitral valve repair Right 01/04/2016    Procedure: MINIMALLY INVASIVE MITRAL VALVE REPAIR (MVR);  Surgeon: Rexene Alberts, MD;  Location: [facility];  Service: Open Heart Surgery;  Laterality: Right;  . Tee without cardioversion N/A 01/04/2016    Procedure: TRANSESOPHAGEAL ECHOCARDIOGRAM (TEE);  Surgeon: Rexene Alberts, MD;  Location: [facility];  Service: Open Heart Surgery;  Laterality: N/A;  . Clipping of atrial appendage N/A 01/04/2016    Procedure: CLIPPING OF ATRIAL APPENDAGE;  Surgeon: Rexene Alberts, MD;  Location: [facility];  Service: Open Heart Surgery;  Laterality: N/A;    reports that he quit smoking about 46 years ago. His smoking use included Cigarettes. He has a 2.5 pack-year smoking history. He has never used smokeless tobacco. He reports that he drinks about 1.2 oz of alcohol per week. He reports that he does not use illicit drugs. Social History   Social History  . Marital Status: Married    Spouse Name: N/A  . Number of Children: N/A  . Years of Education: N/A   Occupational History  . retired Corporate treasurer  taught mathematics at [employer]  . Smoking status: Former Smoker -- 0.50 packs/day for 5 years    Types: Cigarettes    Quit date: 09/04/1969  . Smokeless tobacco: Never Used  . Alcohol Use: 1.2 oz/week    2 Shots of liquor per week     Comment: 2 daily  . Drug Use: No  . Sexual Activity: Not on file   Other Topics Concern  . Not on file   Social History Narrative   Lives at [facility] since 05/2010   Married [name]    Has living will, POA   Retired from [employer] after 29 years   Stopped smoking 1970   Exercise run 1 1/2 mile every other day, swim 15 minutes every other day, use machines in gym   Alcohol 2 drinks daily    Functional Status Survey: Is the patient deaf or have  difficulty hearing?: No Does the patient have difficulty seeing, even when wearing glasses/contacts?: No Does the patient have difficulty concentrating, remembering, or making decisions?: No Does the patient have difficulty walking or climbing stairs?: No Does the patient have difficulty dressing or bathing?: No Does the patient have difficulty doing errands alone such as visiting a doctor's office or shopping?: No  Family History  Problem Relation Age of Onset  . Heart disease Mother     myocardial infarction  . Heart disease Father     myocardial infarction    Health Maintenance  Topic Date Due  . INFLUENZA VACCINE  05/22/2016  . TETANUS/TDAP  02/27/2025  . ZOSTAVAX  Completed  . PNA vac Low Risk Adult  Completed    No Known Allergies    Medication List       This list is accurate as of: 01/11/16 11:59 PM.  Always use your most recent med list.               alfuzosin 10 MG 24 hr tablet  Commonly known as:  UROXATRAL  Take 10 mg by mouth daily with breakfast. Take one daily to reduce bladder spasm     amiodarone 200 MG tablet  Commonly known as:  PACERONE  Take 1 tablet (200 mg total) by mouth daily.     aspirin 81 MG tablet  Take 81 mg by mouth daily. Take one daily for anticoagulation     furosemide 40 MG tablet  Commonly known as:  LASIX  Take 1 tablet (40 mg total) by mouth daily. For 5 days then stop.     Levothyroxine Sodium 50 MCG Caps  Take 1 capsule (50 mcg total) by mouth daily before breakfast.     lisinopril 2.5 MG tablet  Commonly known as:  PRINIVIL,ZESTRIL  Take 1 tablet (2.5 mg total) by mouth daily.     nabumetone 750 MG tablet  Commonly known as:  RELAFEN  TAKE 1 TABLET TWICE A DAY FOR SWELLING     Potassium Chloride ER 20 MEQ Tbcr  Take 10 mEq by mouth daily.     simvastatin 20 MG tablet  Commonly known as:  ZOCOR  TAKE 1 TABLET DAILY TO MAINTAIN LOW CHOLESTEROL     solifenacin 5 MG tablet  Commonly known as:  VESICARE  Take 5  mg by mouth daily.     traMADol 50 MG tablet  Commonly known as:  ULTRAM  Take 1 tablet (50 mg total) by mouth every 6 (six) hours as needed for moderate pain.  warfarin 2 MG tablet  Commonly known as:  COUMADIN  Take 1 tablet (2 mg total) by mouth daily at 6 PM. Or as directed.        Review of Systems  Constitutional: Negative for fever, chills and malaise/fatigue.  HENT: Negative for congestion and hearing loss.   Eyes: Negative for blurred vision.  Respiratory: Negative for cough and shortness of breath.   Cardiovascular: Negative for chest pain and leg swelling.  Gastrointestinal: Negative for abdominal pain, constipation, blood in stool and melena.  Genitourinary: Negative for dysuria, urgency and frequency.  Musculoskeletal: Negative for myalgias, joint pain and falls.  Neurological: Negative for dizziness, loss of consciousness, weakness and headaches.  Endo/Heme/Allergies: Does not bruise/bleed easily.  Psychiatric/Behavioral: Negative for depression and memory loss. The patient does not have insomnia.     Filed Vitals:   02/22/16 1325  BP: 114/67  Pulse: 80  Temp: 97.2 F (36.2 C)  Resp: 16  Height: 5' 7.5" (1.715 m)  Weight: 160 lb 3.2 oz (72.666 kg)  SpO2: 96%   Body mass index is 24.71 kg/(m^2). Physical Exam  Constitutional: He is oriented to person, place, and time. He appears well-developed and well-nourished. No distress.  HENT:  Head: Normocephalic and atraumatic.  Right Ear: External ear normal.  Left Ear: External ear normal.  Nose: Nose normal.  Mouth/Throat: Oropharynx is clear and moist.  Eyes: Conjunctivae and EOM are normal. Pupils are equal, round, and reactive to light.  Neck: Normal range of motion. Neck supple. No JVD present. No thyromegaly present.  Cardiovascular: Normal rate, regular rhythm, normal heart sounds and intact distal pulses.   No murmur heard. Pulmonary/Chest: Effort normal and breath sounds normal. No respiratory  distress. He has no wheezes. He has no rales.  Abdominal: Soft. Bowel sounds are normal. He exhibits no distension and no mass. There is no tenderness.  Musculoskeletal: Normal range of motion.  Lymphadenopathy:    He has no cervical adenopathy.  Neurological: He is alert and oriented to person, place, and time. He has normal reflexes.  Skin: Skin is warm and dry.  Two tube insertion sites on right lateral chest and small incision on right breast area closed with glue  Psychiatric: He has a normal mood and affect.    Labs reviewed: Basic Metabolic Panel:  Recent Labs  01/04/16 2218 01/05/16 0332 01/05/16 1625  01/07/16 0350 01/08/16 0409 01/10/16 0300 02/07/16  NA  --  139 139  < > 139 138 136 141  K  --  4.2 4.0  < > 4.1 3.9 3.9 4.7  CL  --  110 103  < > 105 107 100*  --   CO2  --  18*  --   < > 26 24 26   --   GLUCOSE  --  159* 148*  < > 96 88 92  --   BUN  --  15 19  < > 23* 15 14 19   CREATININE 1.04 1.04 1.09  1.00  < > 1.22 1.01 1.18 1.3  CALCIUM  --  7.4*  --   < > 7.9* 7.9* 8.3*  --   MG 2.7* 2.3 2.4  --   --   --   --   --   < > = values in this interval not displayed. Liver Function Tests:  Recent Labs  10/25/15 01/02/16 1220 01/10/16 0300  AST 23 27 33  ALT 20 23 51  ALKPHOS 66 66 124  BILITOT  --  1.0 0.8  PROT  --  6.2* 5.0*  ALBUMIN  --  3.4* 2.2*   No results for input(s): LIPASE, AMYLASE in the last 8760 hours. No results for input(s): AMMONIA in the last 8760 hours. CBC:  Recent Labs  01/06/16 0513 01/07/16 0350 01/08/16 0409  WBC 11.9* 7.8 6.9  HGB 10.2* 9.1* 9.3*  HCT 29.4* 28.1* 28.7*  MCV 92.2 92.4 92.0  PLT 118* 101* 121*   Cardiac Enzymes: No results for input(s): CKTOTAL, CKMB, CKMBINDEX, TROPONINI in the last 8760 hours. BNP: Invalid input(s): POCBNP Lab Results  Component Value Date   HGBA1C 5.5 01/02/2016   Lab Results  Component Value Date   TSH 6.01* 10/25/2015   No results found for: VITAMINB12 No results found for:  FOLATE No results found for: IRON, TIBC, FERRITIN  Imaging and Procedures obtained prior to SNF admission: Dg Chest 2 View  01/02/2016  CLINICAL DATA:  Preoperative assessment for minimally invasive mitral valve repair, mitral regurgitation, prostate cancer, essential hypertension, carotid artery disease, former smoker EXAM: CHEST  2 VIEW COMPARISON:  CT chest 11/04/2015 FINDINGS: Mild enlargement of cardiac silhouette. Mediastinal contours and pulmonary vascularity normal. Emphysematous changes without infiltrate, pleural effusion or pneumothorax. Multiple old RIGHT rib fractures. Bones demineralized. IMPRESSION: Mild enlargement of cardiac silhouette. COPD changes without acute infiltrate. Electronically Signed   By: Lavonia Dana M.D.   On: 01/02/2016 16:15    Assessment/Plan 1. Mitral regurgitation -resolved after repair  2. S/P minimally invasive mitral valve repair -doing well postop  3. Patent foramen ovale -s/p repair  4. Hypothyroidism, unspecified hypothyroidism type -cont current tirosint--dose recently changed, but this is the brand he typically takes  5. Paroxysmal atrial fibrillation (HCC) -continue baby asa, coumadin with inr goal 2-3, on amiodarone  6. Essential hypertension, benign -bp at goal with low dose lisinopril  Family/ staff Communication:   Discussed with rehab nurse and nurse manager  Labs/tests ordered:  Cbc, bmp before discharge  [place]. Alonah Lineback, D.O. [facility] [address], [place] [address] Cell Phone (Mon-Fri 8am-5pm):  [phone] On Call:  [phone] & follow prompts after 5pm & weekends Office Phone:  [phone] Office Fax:  [phone]

## 2016-01-12 DIAGNOSIS — I499 Cardiac arrhythmia, unspecified: Secondary | ICD-10-CM | POA: Diagnosis not present

## 2016-01-12 DIAGNOSIS — M6281 Muscle weakness (generalized): Secondary | ICD-10-CM | POA: Diagnosis not present

## 2016-01-12 DIAGNOSIS — R2681 Unsteadiness on feet: Secondary | ICD-10-CM | POA: Diagnosis not present

## 2016-01-12 DIAGNOSIS — R2689 Other abnormalities of gait and mobility: Secondary | ICD-10-CM | POA: Diagnosis not present

## 2016-01-12 DIAGNOSIS — R278 Other lack of coordination: Secondary | ICD-10-CM | POA: Diagnosis not present

## 2016-01-12 DIAGNOSIS — R799 Abnormal finding of blood chemistry, unspecified: Secondary | ICD-10-CM | POA: Diagnosis not present

## 2016-01-12 DIAGNOSIS — Z954 Presence of other heart-valve replacement: Secondary | ICD-10-CM | POA: Diagnosis not present

## 2016-01-16 DIAGNOSIS — M6281 Muscle weakness (generalized): Secondary | ICD-10-CM | POA: Diagnosis not present

## 2016-01-16 DIAGNOSIS — R2681 Unsteadiness on feet: Secondary | ICD-10-CM | POA: Diagnosis not present

## 2016-01-16 DIAGNOSIS — Z954 Presence of other heart-valve replacement: Secondary | ICD-10-CM | POA: Diagnosis not present

## 2016-01-16 DIAGNOSIS — R278 Other lack of coordination: Secondary | ICD-10-CM | POA: Diagnosis not present

## 2016-01-16 DIAGNOSIS — R2689 Other abnormalities of gait and mobility: Secondary | ICD-10-CM | POA: Diagnosis not present

## 2016-01-17 ENCOUNTER — Other Ambulatory Visit: Payer: Self-pay | Admitting: Internal Medicine

## 2016-01-18 DIAGNOSIS — R2681 Unsteadiness on feet: Secondary | ICD-10-CM | POA: Diagnosis not present

## 2016-01-18 DIAGNOSIS — R278 Other lack of coordination: Secondary | ICD-10-CM | POA: Diagnosis not present

## 2016-01-18 DIAGNOSIS — Z954 Presence of other heart-valve replacement: Secondary | ICD-10-CM | POA: Diagnosis not present

## 2016-01-18 DIAGNOSIS — R2689 Other abnormalities of gait and mobility: Secondary | ICD-10-CM | POA: Diagnosis not present

## 2016-01-18 DIAGNOSIS — M6281 Muscle weakness (generalized): Secondary | ICD-10-CM | POA: Diagnosis not present

## 2016-01-19 ENCOUNTER — Telehealth: Payer: Self-pay

## 2016-01-19 DIAGNOSIS — I340 Nonrheumatic mitral (valve) insufficiency: Secondary | ICD-10-CM | POA: Diagnosis not present

## 2016-01-19 DIAGNOSIS — Q211 Atrial septal defect: Secondary | ICD-10-CM | POA: Diagnosis not present

## 2016-01-19 DIAGNOSIS — Z7901 Long term (current) use of anticoagulants: Secondary | ICD-10-CM | POA: Diagnosis not present

## 2016-01-19 DIAGNOSIS — I480 Paroxysmal atrial fibrillation: Secondary | ICD-10-CM | POA: Diagnosis not present

## 2016-01-19 NOTE — Telephone Encounter (Signed)
Patient called to say that he tried to have his medication of Tirosint filled and the pharmacy said that he could not get it  Filled because  he has already had a similar medication filled which they say is used for the same treatment.  He would like to speak to you if at all possible about other concerns he has I told him he would need to make an appointment he wishes to talk to you. [phone]) Please Advise.

## 2016-01-19 NOTE — Telephone Encounter (Signed)
Patient called th office back to say he has enough medication to last him until his next appointment on 02/01/16 and he would talk to Dr. Mariea Clonts then told this information to Dr. Mariea Clonts verbally .

## 2016-01-19 NOTE — Telephone Encounter (Signed)
Called patient and left a message indicating that his med list now includes synthroid instead of the tirosint brand of levothyroxine.  Advised him that if he has already filled the synthroid/levothyroxine, he would not also need tirosint/levothyroxine.  Asked him to call back and let us know if he already has the synthroid OR if he wants to continue with the tirosint brand he'd been taking in which case we can change the synthroid prescription back to tirosint.

## 2016-01-20 ENCOUNTER — Other Ambulatory Visit: Payer: Self-pay | Admitting: Thoracic Surgery (Cardiothoracic Vascular Surgery)

## 2016-01-20 DIAGNOSIS — Z9889 Other specified postprocedural states: Secondary | ICD-10-CM

## 2016-01-23 ENCOUNTER — Ambulatory Visit (INDEPENDENT_AMBULATORY_CARE_PROVIDER_SITE_OTHER): Payer: Self-pay | Admitting: Thoracic Surgery (Cardiothoracic Vascular Surgery)

## 2016-01-23 ENCOUNTER — Ambulatory Visit
Admission: RE | Admit: 2016-01-23 | Discharge: 2016-01-23 | Disposition: A | Discharge status: Home / Self Care | Payer: Medicare Other | Source: Ambulatory Visit | Attending: Thoracic Surgery (Cardiothoracic Vascular Surgery) | Admitting: Thoracic Surgery (Cardiothoracic Vascular Surgery)

## 2016-01-23 ENCOUNTER — Encounter: Payer: Self-pay | Admitting: Thoracic Surgery (Cardiothoracic Vascular Surgery)

## 2016-01-23 VITALS — BP 101/61 | HR 80 | Resp 20 | Ht 67.5 in | Wt 161.0 lb

## 2016-01-23 DIAGNOSIS — Q2112 Patent foramen ovale: Secondary | ICD-10-CM

## 2016-01-23 DIAGNOSIS — Z9889 Other specified postprocedural states: Secondary | ICD-10-CM

## 2016-01-23 DIAGNOSIS — J90 Pleural effusion, not elsewhere classified: Secondary | ICD-10-CM | POA: Diagnosis not present

## 2016-01-23 DIAGNOSIS — I340 Nonrheumatic mitral (valve) insufficiency: Secondary | ICD-10-CM

## 2016-01-23 DIAGNOSIS — Q211 Atrial septal defect: Secondary | ICD-10-CM

## 2016-01-23 NOTE — Progress Notes (Signed)
[address]       [place] [address]             [phone]     CARDIOTHORACIC SURGERY OFFICE NOTE  Referring Provider is [name], MD PCP is [name], DO   HPI:  Patient returns to the office today for routine follow-up just over 2 weeks status post minimally invasive mitral valve repair with clipping of left atrial appendage and closure of patent foramen ovale on 01/04/2016.  His postoperative recovery in the hospital was notable for a few brief episodes of paroxysmal atrial fibrillation. He was discharged from the hospital on the sixth postoperative day on low-dose amiodarone and warfarin anticoagulation.  He was initially discharged to the skilled nursing facility rehabilitation unit at [facility] but he only stayed there for 3 days because he progressed so rapidly. He has been home ever since and making remarkably good progress. His prothrombin time was checked and his INR reportedly was 3.2.  He returns to our office for routine follow-up today. He announced that he is already completely recovered from the surgery. He has no significant pain in his chest and states that it only feels sore when he reaches her poles with his arm or shoulder. He is walking every day and his activity level is quite good. He admits that if he pushes himself too much she gets quite fatigued. He has not had any significant shortness of breath he has not had any palpitations or dizzy spells. He has had some problems with constipation but this is reportedly improved using milk of magnesia.  Overall he is quite pleased with his progress. He is scheduled to be seen for routine follow-up by Dr. Wynonia Lawman next week.    Current Outpatient Prescriptions  Medication Sig Dispense Refill  . alfuzosin (UROXATRAL) 10 MG 24 hr tablet Take 10 mg by mouth daily with breakfast. Take one daily to reduce bladder spasm    . amiodarone (PACERONE) 200 MG tablet Take 1 tablet (200 mg total) by mouth  daily. 14 tablet 0  . aspirin 81 MG tablet Take 81 mg by mouth daily. Take one daily for anticoagulation    . levothyroxine (SYNTHROID, LEVOTHROID) 25 MCG tablet TAKE 1 TABLET DAILY IN THE MORNING BEFORE BREAKFAST FOR THYROID 90 tablet 1  . Levothyroxine Sodium 50 MCG CAPS Take 1 capsule (50 mcg total) by mouth daily before breakfast. 90 capsule 3  . lisinopril (PRINIVIL,ZESTRIL) 2.5 MG tablet Take 1 tablet (2.5 mg total) by mouth daily. 30 tablet 1  . nabumetone (RELAFEN) 750 MG tablet TAKE 1 TABLET TWICE A DAY FOR SWELLING 270 tablet 2  . potassium chloride 20 MEQ TBCR Take 10 mEq by mouth daily. 5 tablet 0  . simvastatin (ZOCOR) 20 MG tablet TAKE 1 TABLET DAILY TO MAINTAIN LOW CHOLESTEROL 90 tablet 1  . solifenacin (VESICARE) 5 MG tablet Take 5 mg by mouth daily.    Marland Kitchen warfarin (COUMADIN) 2 MG tablet Take 1 tablet (2 mg total) by mouth daily at 6 PM. Or as directed. 30 tablet 1   No current facility-administered medications for this visit.      Physical Exam:   BP 101/61 mmHg  Pulse 80  Resp 20  Ht 5' 7.5" (1.715 m)  Wt 161 lb (73.029 kg)  BMI 24.83 kg/m2  SpO2 95%  General:  Well-appearing  Chest:   Clear to auscultation  CV:   Regular rate and rhythm without murmur  Incisions:  Healing nicely  Abdomen:  Soft and nontender  Extremities:  Warm and well-perfused with trace bilateral lower extremity edema  Diagnostic Tests:  CHEST 2 VIEW  COMPARISON: 01/10/2016  FINDINGS: Remote right rib trauma. Midline trachea. Borderline cardiomegaly with prior mitral valve repair. Left atrial appendage occlusion device.  Resolution of right-sided subcutaneous emphysema and pneumothorax. Linear density at the right apex, favored to represent pleural thickening, and is present back on 01/02/2016.  Small volume right-sided pleural fluid is further decreased with primarily pleural thickening remaining. No congestive failure. Persistent right base  atelectasis.  IMPRESSION: Further decrease in small right-sided pleural effusion with similar adjacent right base atelectasis.  Resolution of right apical pneumothorax and right-sided chest wall subcutaneous emphysema.   Electronically Signed  By: Abigail Miyamoto M.D.  On: 01/23/2016 13:20    Impression:  Patient is doing well less than 3 weeks status post minimally invasive mitral valve repair.   Plan:  I have instructed the patient to stop taking potassium supplements now that he is no longer taking Lasix. I suggested that he try using Colace as a stool softener.  We have otherwise not made any changes to his current medications. I think it would probably be reasonable to stop taking amiodarone when his current prescription runs out in approximately 2 weeks. I have encouraged the patient to continue to gradually increase his physical activity. I think he would benefit from the outpatient cardiac rehabilitation program, but the patient plans to recover himself on his own. I think it is reasonable for him to return to driving an automobile. All of his questions have been addressed. The patient will return in 4 weeks for routine follow-up.    [name], MD 01/23/2016 2:18 PM

## 2016-01-23 NOTE — Patient Instructions (Addendum)
You may continue to gradually increase your physical activity as tolerated.  Refrain from any heavy lifting or strenuous use of your arms and shoulders until at least 8 weeks from the time of your surgery, and avoid activities that cause increased pain in your chest on the side of your surgical incision.  Otherwise you may continue to increase activities without any particular limitations.  Increase the intensity and duration of physical activity gradually.  Stop taking potassium as long as you are not taking lasix (furosemide).  Continue taking amiodarone until your current prescription runs out in approximately 2 weeks.    Consider taking Colace as a stool softener.  Continue all other medications without any changes at this time  You are encouraged to enroll and participate in the outpatient cardiac rehab program beginning as soon as practical.  You may return to driving an automobile as long as you are no longer requiring oral narcotic pain relievers during the daytime.  It would be wise to start driving only short distances during the daylight and gradually increase from there as you feel comfortable.

## 2016-01-24 DIAGNOSIS — I6529 Occlusion and stenosis of unspecified carotid artery: Secondary | ICD-10-CM | POA: Diagnosis not present

## 2016-01-24 DIAGNOSIS — Z954 Presence of other heart-valve replacement: Secondary | ICD-10-CM | POA: Diagnosis not present

## 2016-01-24 DIAGNOSIS — K219 Gastro-esophageal reflux disease without esophagitis: Secondary | ICD-10-CM | POA: Diagnosis not present

## 2016-01-24 DIAGNOSIS — I480 Paroxysmal atrial fibrillation: Secondary | ICD-10-CM | POA: Diagnosis not present

## 2016-01-24 DIAGNOSIS — Z7901 Long term (current) use of anticoagulants: Secondary | ICD-10-CM | POA: Diagnosis not present

## 2016-01-24 DIAGNOSIS — E785 Hyperlipidemia, unspecified: Secondary | ICD-10-CM | POA: Diagnosis not present

## 2016-01-24 DIAGNOSIS — I340 Nonrheumatic mitral (valve) insufficiency: Secondary | ICD-10-CM | POA: Diagnosis not present

## 2016-01-30 ENCOUNTER — Other Ambulatory Visit: Payer: Self-pay | Admitting: *Deleted

## 2016-01-30 ENCOUNTER — Other Ambulatory Visit: Payer: Self-pay | Admitting: Ophthalmology

## 2016-01-30 DIAGNOSIS — D485 Neoplasm of uncertain behavior of skin: Secondary | ICD-10-CM | POA: Diagnosis not present

## 2016-01-30 DIAGNOSIS — C44119 Basal cell carcinoma of skin of left eyelid, including canthus: Secondary | ICD-10-CM | POA: Diagnosis not present

## 2016-01-30 MED ORDER — LISINOPRIL 2.5 MG PO TABS: 2.5000 mg | ORAL_TABLET | Freq: Every day | ORAL | Status: DC

## 2016-01-30 NOTE — Telephone Encounter (Signed)
[facility]

## 2016-02-01 ENCOUNTER — Encounter: Payer: Self-pay | Admitting: Internal Medicine

## 2016-02-01 ENCOUNTER — Other Ambulatory Visit: Payer: Self-pay

## 2016-02-01 ENCOUNTER — Non-Acute Institutional Stay: Payer: Medicare Other | Admitting: Internal Medicine

## 2016-02-01 VITALS — BP 110/60 | HR 94 | Temp 97.4°F | Ht 68.0 in | Wt 162.0 lb

## 2016-02-01 DIAGNOSIS — R600 Localized edema: Secondary | ICD-10-CM

## 2016-02-01 DIAGNOSIS — I10 Essential (primary) hypertension: Secondary | ICD-10-CM

## 2016-02-01 DIAGNOSIS — E039 Hypothyroidism, unspecified: Secondary | ICD-10-CM

## 2016-02-01 DIAGNOSIS — Q211 Atrial septal defect: Secondary | ICD-10-CM | POA: Diagnosis not present

## 2016-02-01 DIAGNOSIS — E785 Hyperlipidemia, unspecified: Secondary | ICD-10-CM

## 2016-02-01 DIAGNOSIS — I6529 Occlusion and stenosis of unspecified carotid artery: Secondary | ICD-10-CM | POA: Diagnosis not present

## 2016-02-01 DIAGNOSIS — K219 Gastro-esophageal reflux disease without esophagitis: Secondary | ICD-10-CM | POA: Diagnosis not present

## 2016-02-01 DIAGNOSIS — Q2112 Patent foramen ovale: Secondary | ICD-10-CM

## 2016-02-01 DIAGNOSIS — Z9889 Other specified postprocedural states: Secondary | ICD-10-CM | POA: Diagnosis not present

## 2016-02-01 DIAGNOSIS — I480 Paroxysmal atrial fibrillation: Secondary | ICD-10-CM | POA: Diagnosis not present

## 2016-02-01 DIAGNOSIS — Z7901 Long term (current) use of anticoagulants: Secondary | ICD-10-CM | POA: Diagnosis not present

## 2016-02-01 DIAGNOSIS — I340 Nonrheumatic mitral (valve) insufficiency: Secondary | ICD-10-CM | POA: Diagnosis not present

## 2016-02-01 DIAGNOSIS — Z954 Presence of other heart-valve replacement: Secondary | ICD-10-CM | POA: Diagnosis not present

## 2016-02-01 MED ORDER — LEVOTHYROXINE SODIUM 50 MCG PO CAPS: 1.0000 | ORAL_CAPSULE | Freq: Every day | ORAL | Status: DC

## 2016-02-01 NOTE — Telephone Encounter (Signed)
Called spoke with patient about klor-con 10 er tabs received request for refill, I called patient to ask him if he is still taking this.  He said he was told by his cardiologist not to take them any more wrote this on prescription and [facility].

## 2016-02-01 NOTE — Progress Notes (Signed)
Patient ID: Samuel [name], male   DOB: 1932-08-23, 82 y.o.   MRN: [id number]   Location:  [facility] (12)  Provider: Shawntavia Saunders L. Mariea Clonts, D.O., C.M.D.  Code Status: full code Goals of Care:  Advanced Directives 02/01/2016  Does patient have an advance directive? Yes  Type of Paramedic of [place];Living will  Does patient want to make changes to advanced directive? -  Copy of advanced directive(s) in chart? Yes   Chief Complaint  Patient presents with  . Medical Management of Chronic Issues    follow up from rehab    HPI: Patient is a 82 y.o. male seen today for medical management of chronic diseases.  He was in rehab s/p minimally invasive mitral valve repair due to flail chord causing mitral regurg, PFO repair.  He is doing well.    His biggest concerns are about some confusion with his medications (see a/p for conclusions).  Some of these need his cardiologist's input.  Left eyelid area had biopsy done by ophtho.  Was not recovering with freezing.  He has a question about his incision--wants to know if it's closed up enough to swim in the salt water pool.    He's having swelling in his ankles now.  No shortness of breath walking all the way around the campus.  Weight is down. 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    Past Medical History  Diagnosis Date  . Pain in joint, lower leg 02/25/2012  . Unspecified hypothyroidism 08/27/2011  . Palpitations 08/14/2010  . Cortical senile cataract 06/27/2010  . Malignant neoplasm of prostate ([place]) 06/21/2010  . Other and unspecified hyperlipidemia 06/21/2010  . Unspecified essential hypertension 06/21/2010  . Unspecified constipation 06/21/2010  . Hypertrophy of prostate without urinary obstruction and other lower urinary tract symptoms (LUTS) 06/21/2010  . Osteoarthrosis, unspecified whether generalized or localized, unspecified site 06/21/2010  . Contracture  of palmar fascia 06/21/2010  . Enthesopathy of hip region 11/27/2009  . Urinary frequency 08/14/2008  . Nocturia 2011  . Mitral regurgitation 10/07/2015    Severe MR noted on ECHO with partially flail post leaflet Oct 03 2015   . Essential hypertension, benign 06/21/2010  . Hypothyroidism 08/27/2011  . Hyperlipidemia LDL goal <100 06/21/2010  . Complication of anesthesia 1955    sodium pentathol ?  Marland Kitchen Dysrhythmia   . Carotid artery disease ([place])   . Aortoiliac occlusive disease ([place])   . GERD (gastroesophageal reflux disease)     occ  . Patent foramen ovale 01/04/2016    Closed at the time of mitral valve repair   . S/P minimally invasive mitral valve repair 01/04/2016    Complex valvuloplasty including triangular resection of flail segment of posterior leaflet, artificial Gore-tex neochord placement x6 and 82mm Sorin Memo 3D ring annuloplasty via right mini thoracotomy approach with closure of PFO and clipping of LA appendage     Past Surgical History  Procedure Laterality Date  . Total knee arthroplasty Right 1955    Dr. Ethel Rana  . Knee arthroscopy with meniscal repair Left 1968  . Cataract extraction w/ intraocular lens  implant, bilateral  2006  . Cardiac catheterization N/A 11/10/2015    Procedure: Right/Left Heart Cath and Coronary Angiography;  Surgeon: Peter M Martinique, MD;  Location: [facility];  Service: Cardiovascular;  Laterality: N/A;  . Tee without cardioversion N/A 11/10/2015    Procedure: TRANSESOPHAGEAL ECHOCARDIOGRAM (TEE);  Surgeon: Skeet Latch, MD;  Location: [facility];  Service: Cardiovascular;  Laterality: N/A;  . Prostate surgery  2000    seed implants  . Mitral valve repair Right 01/04/2016    Procedure: MINIMALLY INVASIVE MITRAL VALVE REPAIR (MVR);  Surgeon: Rexene Alberts, MD;  Location: [facility];  Service: Open Heart Surgery;  Laterality: Right;  . Tee without cardioversion N/A 01/04/2016    Procedure: TRANSESOPHAGEAL ECHOCARDIOGRAM (TEE);  Surgeon:  Rexene Alberts, MD;  Location: [facility];  Service: Open Heart Surgery;  Laterality: N/A;  . Clipping of atrial appendage N/A 01/04/2016    Procedure: CLIPPING OF ATRIAL APPENDAGE;  Surgeon: Rexene Alberts, MD;  Location: [facility];  Service: Open Heart Surgery;  Laterality: N/A;    No Known Allergies    Medication List       This list is accurate as of: 02/01/16 10:10 AM.  Always use your most recent med list.               alfuzosin 10 MG 24 hr tablet  Commonly known as:  UROXATRAL  Take 10 mg by mouth daily with breakfast. Take one daily to reduce bladder spasm     amiodarone 200 MG tablet  Commonly known as:  PACERONE  Take 1 tablet (200 mg total) by mouth daily.     aspirin 81 MG tablet  Take 81 mg by mouth daily. Take one daily for anticoagulation     Levothyroxine Sodium 50 MCG Caps  Take 1 capsule (50 mcg total) by mouth daily before breakfast.     levothyroxine 25 MCG tablet  Commonly known as:  SYNTHROID, LEVOTHROID  TAKE 1 TABLET DAILY IN THE MORNING BEFORE BREAKFAST FOR THYROID     lisinopril 2.5 MG tablet  Commonly known as:  PRINIVIL,ZESTRIL  Take 1 tablet (2.5 mg total) by mouth daily.     nabumetone 750 MG tablet  Commonly known as:  RELAFEN  TAKE 1 TABLET TWICE A DAY FOR SWELLING     simvastatin 20 MG tablet  Commonly known as:  ZOCOR  TAKE 1 TABLET DAILY TO MAINTAIN LOW CHOLESTEROL     solifenacin 5 MG tablet  Commonly known as:  VESICARE  Take 5 mg by mouth daily.     warfarin 2 MG tablet  Commonly known as:  COUMADIN  Take 1 tablet (2 mg total) by mouth daily at 6 PM. Or as directed.       Review of Systems:  Review of Systems  Constitutional: Positive for unexpected weight change. Negative for fever, activity change and appetite change.  HENT: Negative for congestion.   Respiratory: Negative for chest tightness and shortness of breath.   Cardiovascular: Positive for leg swelling. Negative for chest pain and palpitations.  Genitourinary:  Negative for dysuria.  Musculoskeletal: Negative for joint swelling and arthralgias.  Skin: Negative for color change.  Neurological: Negative for dizziness and weakness.  Psychiatric/Behavioral: Negative for confusion and decreased concentration.    Health Maintenance  Topic Date Due  . INFLUENZA VACCINE  05/22/2016  . TETANUS/TDAP  02/27/2025  . ZOSTAVAX  Completed  . PNA vac Low Risk Adult  Completed    Physical Exam: Filed Vitals:   02/01/16 0924  BP: 110/60  Pulse: 94  Temp: 97.4 F (36.3 C)  TempSrc: Oral  Height: 5\' 8"  (1.727 m)  Weight: 162 lb (73.483 kg)  SpO2: 97%   Body mass index is 24.64 kg/(m^2). Physical Exam  Constitutional: He is oriented to person, place, and time. He appears  well-developed and well-nourished. No distress.  Neck: Neck supple. No JVD present.  Cardiovascular: Normal rate, regular rhythm, normal heart sounds and intact distal pulses.   1+ pitting edema  Pulmonary/Chest: Effort normal and breath sounds normal. He has no rales.  Musculoskeletal: Normal range of motion.  Lymphadenopathy:    He has no cervical adenopathy.  Neurological: He is alert and oriented to person, place, and time.  Skin: Skin is warm and dry.  Psychiatric: He has a normal mood and affect.    Labs reviewed: Basic Metabolic Panel:  Recent Labs  10/25/15  01/04/16 2218 01/05/16 0332 01/05/16 1625  01/07/16 0350 01/08/16 0409 01/10/16 0300  NA 138  < >  --  139 139  < > 139 138 136  K 4.1  < >  --  4.2 4.0  < > 4.1 3.9 3.9  CL  --   < >  --  110 103  < > 105 107 100*  CO2  --   < >  --  18*  --   < > 26 24 26   GLUCOSE  --   < >  --  159* 148*  < > 96 88 92  BUN 18  < >  --  15 19  < > 23* 15 14  CREATININE 1.1  < > 1.04 1.04 1.09  1.00  < > 1.22 1.01 1.18  CALCIUM  --   < >  --  7.4*  --   < > 7.9* 7.9* 8.3*  MG  --   --  2.7* 2.3 2.4  --   --   --   --   TSH 6.01*  --   --   --   --   --   --   --   --   < > = values in this interval not  displayed. Liver Function Tests:  Recent Labs  10/25/15 01/02/16 1220 01/10/16 0300  AST 23 27 33  ALT 20 23 51  ALKPHOS 66 66 124  BILITOT  --  1.0 0.8  PROT  --  6.2* 5.0*  ALBUMIN  --  3.4* 2.2*   No results for input(s): LIPASE, AMYLASE in the last 8760 hours. No results for input(s): AMMONIA in the last 8760 hours. CBC:  Recent Labs  01/06/16 0513 01/07/16 0350 01/08/16 0409  WBC 11.9* 7.8 6.9  HGB 10.2* 9.1* 9.3*  HCT 29.4* 28.1* 28.7*  MCV 92.2 92.4 92.0  PLT 118* 101* 121*   Lipid Panel:  Recent Labs  10/25/15  CHOL 164  HDL 67  LDLCALC 69  TRIG 75   Lab Results  Component Value Date   HGBA1C 5.5 01/02/2016    Procedures since last visit: Dg Chest 2 View  01/23/2016  CLINICAL DATA:  Status post mitral valve replacement 01/04/2016. EXAM: CHEST  2 VIEW COMPARISON:  01/10/2016 FINDINGS: Remote right rib trauma. Midline trachea. Borderline cardiomegaly with prior mitral valve repair. Left atrial appendage occlusion device. Resolution of right-sided subcutaneous emphysema and pneumothorax. Linear density at the right apex, favored to represent pleural thickening, and is present back on 01/02/2016. Small volume right-sided pleural fluid is further decreased with primarily pleural thickening remaining. No congestive failure. Persistent right base atelectasis. IMPRESSION: Further decrease in small right-sided pleural effusion with similar adjacent right base atelectasis. Resolution of right apical pneumothorax and right-sided chest wall subcutaneous emphysema. Electronically Signed   By: Abigail Miyamoto M.D.   On: 01/23/2016 13:20   Dg Chest  2 View  01/10/2016  CLINICAL DATA:  Right pleural effusion EXAM: CHEST  2 VIEW COMPARISON:  Chest radiograph from one day prior. FINDINGS: Cardiac valvular prosthesis is in place. Stable cardiomediastinal silhouette with top-normal heart size. Stable tiny right apical pneumothorax. No left pneumothorax. Stable small right pleural  effusion. Small left pleural effusion, slightly decreased. Mild to moderate bibasilar atelectasis, slightly decreased. No overt pulmonary edema. Stable subcutaneous emphysema in the right lateral chest wall. IMPRESSION: 1. Stable small right hydropneumothorax, with stable tiny apical pneumothorax component and stable small basilar pleural effusion component. 2. Small left pleural effusion, slightly decreased . 3. Mild-to-moderate bibasilar atelectasis, slightly improved. Electronically Signed   By: Ilona Sorrel M.D.   On: 01/10/2016 08:06   Dg Chest 2 View  01/09/2016  CLINICAL DATA:  Cough.  Shortness of breath . EXAM: CHEST  2 VIEW COMPARISON:  01/06/2016. FINDINGS: Interim removal of right chest tube . Tiny right apical pneumothorax noted . Mediastinum and hilar structures are normal. Cardiomegaly with normal pulmonary vascularity. Left atrial appendage clip. Aortic valve replacement. Right chest wall subcutaneous emphysema. Bilateral pleural thickening and/or small effusions . Old right rib fractures. IMPRESSION: 1. Interim removal right chest tube. Tiny right apical pneumothorax. Right chest wall subcutaneous emphysema . 2. Low lung volumes with persistent bibasilar atelectasis and bilateral pleural effusions. 3. Prior cardiac valve replacement. Left atrial appendage clip noted. Heart size stable. No pulmonary venous congestion. Critical Value/emergent results were called by telephone at the time of interpretation on 01/09/2016 at 7:50 am to nurse [facility], who verbally acknowledged these results. Electronically Signed   By: Marcello Moores  Register   On: 01/09/2016 07:53   Dg Chest 2 View  01/02/2016  CLINICAL DATA:  Preoperative assessment for minimally invasive mitral valve repair, mitral regurgitation, prostate cancer, essential hypertension, carotid artery disease, former smoker EXAM: CHEST  2 VIEW COMPARISON:  CT chest 11/04/2015 FINDINGS: Mild enlargement of cardiac silhouette. Mediastinal contours and  pulmonary vascularity normal. Emphysematous changes without infiltrate, pleural effusion or pneumothorax. Multiple old RIGHT rib fractures. Bones demineralized. IMPRESSION: Mild enlargement of cardiac silhouette. COPD changes without acute infiltrate. Electronically Signed   By: Lavonia Dana M.D.   On: 01/02/2016 16:15   Dg Chest Port 1 View  01/06/2016  CLINICAL DATA:  Status post minimally invasive mitral valve repair. EXAM: PORTABLE CHEST 1 VIEW COMPARISON:  01/05/2016 FINDINGS: The right-sided chest tubes are stable. No definite pneumothorax. Stable prosthetic mitral valve and left atrial appendage closure device. Small amount of subcutaneous emphysema is again demonstrated. Bibasilar atelectasis and small effusions persist. IMPRESSION: Stable right-sided chest tubes without definite pneumothorax. Persistent small effusions and bibasilar atelectasis. Electronically Signed   By: Marijo Sanes M.D.   On: 01/06/2016 08:10   Dg Chest Port 1 View  01/05/2016  CLINICAL DATA:  Atelectasis. EXAM: PORTABLE CHEST 1 VIEW COMPARISON:  January 04, 2016. FINDINGS: Stable cardiomegaly. Stable position of right-sided chest tubes without definite pneumothorax. Stable right basilar opacity is noted concerning for atelectasis or infiltrate. Endotracheal and nasogastric tubes have been removed. Stable position of left subclavian Swan-Ganz catheter with tip directed toward right pulmonary artery. Stable mild left basilar opacity is noted concerning for atelectasis or infiltrate. Bony thorax is unremarkable. IMPRESSION: Stable position of right-sided chest tubes without definite pneumothorax. Stable bibasilar opacities are noted. Endotracheal and nasogastric tubes have been removed. Electronically Signed   By: Marijo Conception, M.D.   On: 01/05/2016 07:55   Dg Chest Port 1 View  01/04/2016  CLINICAL DATA:  Mitral valve replacement. EXAM: PORTABLE CHEST - 1 VIEW COMPARISON:  Two-view chest x-ray 01/02/2016. FINDINGS: The patient  is intubated. The endotracheal tube terminates 5.5 cm above the carina. A right-sided chest tube is in place. There is no significant pneumothorax. Small bilateral pleural effusions are noted. Lateral valve annular repair and clipping of the atrial appendage are noted. External pacing wires are in place. A mediastinal drain is in place. A Swan-Ganz catheter enters via a left subclavian line. It terminates in the proximal right main pulmonary artery. IMPRESSION: 1. Status post mitral annular repair and atrial appendage clipping. 2. Support apparatus as described. 3. Right-sided chest tube without pneumothorax. 4. Bilateral pleural effusions. Electronically Signed   By: San Morelle M.D.   On: 01/04/2016 16:34    Assessment/Plan 1. Hypothyroidism, unspecified hypothyroidism type - tried to straighten this out with his pharmacy -his tirosint was increased to 54mcg from 60mcg, but [facility] not [facility] the new dosage b/c they have orders for the old one it seems like -CMA calling to get old dosage stopped and new begun (pt does have his proper medication) - Levothyroxine Sodium 50 MCG CAPS; Take 1 capsule (50 mcg total) by mouth daily before breakfast.  Dispense: 90 capsule; Refill: 3  2. Mitral regurgitation -resolved with surgical repair and doing well  3. Essential hypertension, benign -bp well controlled, cont same regimen  4. Hyperlipidemia LDL goal <100 -cont zocor, doing well  5. Bilateral edema of lower extremity -new onset -reports he got lasix short term at the hospital, but it was stopped at discharge -remains on potassium -advised to discuss at cardiology appt due to recent surgery and that I suspected he will need to restart the lasix and cont potassium -we are going to check his BMP next tuesday  6. S/P minimally invasive mitral valve repair Advised to come back in 1 week for me to check again on wound from chest tubes on right side before he starts swimming  again--ok'd to do other stretching, etc.    7. Paroxysmal atrial fibrillation ([place]) -not in this now by exam -was in distant past at some point  8. Patent foramen ovale -s/p repair  Med questions/concerns: Stop amiodarone Already off lasix and toprol xl since hospitalization Getting coumadin checked today at Dr. Thurman Coyer Need to cancel incorrect thyroid meds [facility] Is on lisinopril ? If needs potassium  Labs/tests ordered:  BMP on next Tues [facility] at [facility] Next appt:  1 week wound check Camar Guyton L. Cleone Hulick, D.O. [facility] [address], [place] [address] Cell Phone (Mon-Fri 8am-5pm):  [phone] On Call:  [phone] & follow prompts after 5pm & weekends Office Phone:  [phone] Office Fax:  [phone]

## 2016-02-07 DIAGNOSIS — I10 Essential (primary) hypertension: Secondary | ICD-10-CM | POA: Diagnosis not present

## 2016-02-07 LAB — BASIC METABOLIC PANEL
BUN: 19 mg/dL (ref 4–21)
Creatinine: 1.3 mg/dL (ref 0.6–1.3)
Glucose: 86 mg/dL
Potassium: 4.7 mmol/L (ref 3.4–5.3)
SODIUM: 141 mmol/L (ref 137–147)

## 2016-02-08 ENCOUNTER — Encounter: Payer: Self-pay | Admitting: Internal Medicine

## 2016-02-08 ENCOUNTER — Non-Acute Institutional Stay: Payer: Medicare Other | Admitting: Internal Medicine

## 2016-02-08 VITALS — BP 120/62 | HR 82 | Temp 97.5°F | Ht 68.0 in | Wt 157.0 lb

## 2016-02-08 DIAGNOSIS — Z9889 Other specified postprocedural states: Secondary | ICD-10-CM

## 2016-02-08 DIAGNOSIS — Z8774 Personal history of (corrected) congenital malformations of heart and circulatory system: Secondary | ICD-10-CM

## 2016-02-08 DIAGNOSIS — I480 Paroxysmal atrial fibrillation: Secondary | ICD-10-CM | POA: Diagnosis not present

## 2016-02-08 DIAGNOSIS — R600 Localized edema: Secondary | ICD-10-CM

## 2016-02-08 NOTE — Progress Notes (Signed)
Patient ID: Samuel [name], male   DOB: [birth year], 82 y.o.   MRN: [id number]   Location:  [facility] (12)  Provider: Shawna Wearing L. Mariea Clonts, D.O., C.M.D.  Code Status: DNR Goals of Care:  Advanced Directives 02/08/2016  Does patient have an advance directive? Yes  Type of Advance Directive Living will;Healthcare Power of Attorney  Copy of advanced directive(s) in chart? Yes   Chief Complaint  Patient presents with  . Acute Visit    wound check    HPI: Patient is a 82 y.o. male seen today for wound check.    Wants to get back to swimming so I'm checking to be sure he no longer has any open areas from his chest tube sites.  Leg edema is better after taking lasix daily since prescribed by Dr. Debara Pickett last week.  He wants to go to as needed now and his renal function has been affected so I agree with this change.    INR was within range last time at cardiology.    CMA has corrected problem with tirosint brand of levothyroxine.  For his prostate, he continues on his uroxatral and vesicare.  He is to take the coreg, but not the lisinopril.  He's off amiodarone.  He remains on baby asa and is on nabumetone for swelling also.  Past Medical History  Diagnosis Date  . Pain in joint, lower leg 02/25/2012  . Unspecified hypothyroidism 08/27/2011  . Palpitations 08/14/2010  . Cortical senile cataract 06/27/2010  . Malignant neoplasm of prostate ([place]) 06/21/2010  . Other and unspecified hyperlipidemia 06/21/2010  . Unspecified essential hypertension 06/21/2010  . Unspecified constipation 06/21/2010  . Hypertrophy of prostate without urinary obstruction and other lower urinary tract symptoms (LUTS) 06/21/2010  . Osteoarthrosis, unspecified whether generalized or localized, unspecified site 06/21/2010  . Contracture of palmar fascia 06/21/2010  . Enthesopathy of hip region 11/27/2009  . Urinary frequency 08/14/2008  . Nocturia 2011  . Mitral regurgitation  10/07/2015    Severe MR noted on ECHO with partially flail post leaflet Oct 03 2015   . Essential hypertension, benign 06/21/2010  . Hypothyroidism 08/27/2011  . Hyperlipidemia LDL goal <100 06/21/2010  . Complication of anesthesia 1955    sodium pentathol ?  Marland Kitchen Dysrhythmia   . Carotid artery disease ([place])   . Aortoiliac occlusive disease ([place])   . GERD (gastroesophageal reflux disease)     occ  . Patent foramen ovale 01/04/2016    Closed at the time of mitral valve repair   . S/P minimally invasive mitral valve repair 01/04/2016    Complex valvuloplasty including triangular resection of flail segment of posterior leaflet, artificial Gore-tex neochord placement x6 and 52mm Sorin Memo 3D ring annuloplasty via right mini thoracotomy approach with closure of PFO and clipping of LA appendage     Past Surgical History  Procedure Laterality Date  . Total knee arthroplasty Right 1955    Dr. Ethel Rana  . Knee arthroscopy with meniscal repair Left 1968  . Cataract extraction w/ intraocular lens  implant, bilateral  2006  . Cardiac catheterization N/A 11/10/2015    Procedure: Right/Left Heart Cath and Coronary Angiography;  Surgeon: Peter M Martinique, MD;  Location: [facility];  Service: Cardiovascular;  Laterality: N/A;  . Tee without cardioversion N/A 11/10/2015    Procedure: TRANSESOPHAGEAL ECHOCARDIOGRAM (TEE);  Surgeon: Skeet Latch, MD;  Location: [facility];  Service: Cardiovascular;  Laterality: N/A;  . Prostate surgery  2000  seed implants  . Mitral valve repair Right 01/04/2016    Procedure: MINIMALLY INVASIVE MITRAL VALVE REPAIR (MVR);  Surgeon: Rexene Alberts, MD;  Location: [facility];  Service: Open Heart Surgery;  Laterality: Right;  . Tee without cardioversion N/A 01/04/2016    Procedure: TRANSESOPHAGEAL ECHOCARDIOGRAM (TEE);  Surgeon: Rexene Alberts, MD;  Location: [facility];  Service: Open Heart Surgery;  Laterality: N/A;  . Clipping of atrial appendage N/A 01/04/2016    Procedure:  CLIPPING OF ATRIAL APPENDAGE;  Surgeon: Rexene Alberts, MD;  Location: [facility];  Service: Open Heart Surgery;  Laterality: N/A;    No Known Allergies    Medication List       This list is accurate as of: 02/08/16  4:21 PM.  Always use your most recent med list.               alfuzosin 10 MG 24 hr tablet  Commonly known as:  UROXATRAL  Take 10 mg by mouth daily with breakfast. Take one daily to reduce bladder spasm     aspirin 81 MG tablet  Take 81 mg by mouth daily. Take one daily for anticoagulation     furosemide 20 MG tablet  Commonly known as:  LASIX     Levothyroxine Sodium 50 MCG Caps  Take 1 capsule (50 mcg total) by mouth daily before breakfast.     nabumetone 750 MG tablet  Commonly known as:  RELAFEN  TAKE 1 TABLET TWICE A DAY FOR SWELLING     simvastatin 20 MG tablet  Commonly known as:  ZOCOR  TAKE 1 TABLET DAILY TO MAINTAIN LOW CHOLESTEROL     solifenacin 5 MG tablet  Commonly known as:  VESICARE  Take 5 mg by mouth daily.     warfarin 2 MG tablet  Commonly known as:  COUMADIN  Take 1 tablet (2 mg total) by mouth daily at 6 PM. Or as directed.        Review of Systems:  Review of Systems  Constitutional: Negative for fever, chills, appetite change and fatigue.  HENT: Negative for congestion.   Respiratory: Negative for chest tightness and shortness of breath.   Cardiovascular: Negative for chest pain, palpitations and leg swelling.  Gastrointestinal: Negative for abdominal pain and abdominal distention.  Genitourinary: Negative for dysuria.  Musculoskeletal: Negative for arthralgias.  Neurological: Negative for dizziness and weakness.  Hematological: Negative for adenopathy.  Psychiatric/Behavioral: Negative for confusion.    Health Maintenance  Topic Date Due  . INFLUENZA VACCINE  05/22/2016  . TETANUS/TDAP  02/27/2025  . ZOSTAVAX  Completed  . PNA vac Low Risk Adult  Completed    Physical Exam: Filed Vitals:   02/08/16 1606  BP:  120/62  Pulse: 82  Temp: 97.5 F (36.4 C)  TempSrc: Oral  Height: 5\' 8"  (1.727 m)  Weight: 157 lb (71.215 kg)  SpO2: 97%   Body mass index is 23.88 kg/(m^2). Physical Exam  Constitutional: He is oriented to person, place, and time. He appears well-developed and well-nourished. No distress.  Cardiovascular: Normal rate, regular rhythm, normal heart sounds and intact distal pulses.   No murmur heard. Pulmonary/Chest: Effort normal and breath sounds normal. No respiratory distress.  Abdominal: Soft. Bowel sounds are normal.  Musculoskeletal: Normal range of motion.  Neurological: He is alert and oriented to person, place, and time.  Skin: Skin is warm and dry.  Has eschars over all three wounds on right chest; upper with glue, bottom two w/o any,  very bottom one with thick eschar still present; no drainage, warmth, some mild erythema from bathing he reports  Psychiatric: He has a normal mood and affect.    Labs reviewed: Basic Metabolic Panel:  Recent Labs  10/25/15  01/04/16 2218 01/05/16 0332 01/05/16 1625  01/07/16 0350 01/08/16 0409 01/10/16 0300  NA 138  < >  --  139 139  < > 139 138 136  K 4.1  < >  --  4.2 4.0  < > 4.1 3.9 3.9  CL  --   < >  --  110 103  < > 105 107 100*  CO2  --   < >  --  18*  --   < > 26 24 26   GLUCOSE  --   < >  --  159* 148*  < > 96 88 92  BUN 18  < >  --  15 19  < > 23* 15 14  CREATININE 1.1  < > 1.04 1.04 1.09  1.00  < > 1.22 1.01 1.18  CALCIUM  --   < >  --  7.4*  --   < > 7.9* 7.9* 8.3*  MG  --   --  2.7* 2.3 2.4  --   --   --   --   TSH 6.01*  --   --   --   --   --   --   --   --   < > = values in this interval not displayed. Liver Function Tests:  Recent Labs  10/25/15 01/02/16 1220 01/10/16 0300  AST 23 27 33  ALT 20 23 51  ALKPHOS 66 66 124  BILITOT  --  1.0 0.8  PROT  --  6.2* 5.0*  ALBUMIN  --  3.4* 2.2*   No results for input(s): LIPASE, AMYLASE in the last 8760 hours. No results for input(s): AMMONIA in the last 8760  hours. CBC:  Recent Labs  01/06/16 0513 01/07/16 0350 01/08/16 0409  WBC 11.9* 7.8 6.9  HGB 10.2* 9.1* 9.3*  HCT 29.4* 28.1* 28.7*  MCV 92.2 92.4 92.0  PLT 118* 101* 121*   Lipid Panel:  Recent Labs  10/25/15  CHOL 164  HDL 67  LDLCALC 69  TRIG 75   Lab Results  Component Value Date   HGBA1C 5.5 01/02/2016    Procedures since last visit: Dg Chest 2 View  01/23/2016  CLINICAL DATA:  Status post mitral valve replacement 01/04/2016. EXAM: CHEST  2 VIEW COMPARISON:  01/10/2016 FINDINGS: Remote right rib trauma. Midline trachea. Borderline cardiomegaly with prior mitral valve repair. Left atrial appendage occlusion device. Resolution of right-sided subcutaneous emphysema and pneumothorax. Linear density at the right apex, favored to represent pleural thickening, and is present back on 01/02/2016. Small volume right-sided pleural fluid is further decreased with primarily pleural thickening remaining. No congestive failure. Persistent right base atelectasis. IMPRESSION: Further decrease in small right-sided pleural effusion with similar adjacent right base atelectasis. Resolution of right apical pneumothorax and right-sided chest wall subcutaneous emphysema. Electronically Signed   By: Abigail Miyamoto M.D.   On: 01/23/2016 13:20   Dg Chest 2 View  01/10/2016  CLINICAL DATA:  Right pleural effusion EXAM: CHEST  2 VIEW COMPARISON:  Chest radiograph from one day prior. FINDINGS: Cardiac valvular prosthesis is in place. Stable cardiomediastinal silhouette with top-normal heart size. Stable tiny right apical pneumothorax. No left pneumothorax. Stable small right pleural effusion. Small left pleural effusion, slightly decreased. Mild to  moderate bibasilar atelectasis, slightly decreased. No overt pulmonary edema. Stable subcutaneous emphysema in the right lateral chest wall. IMPRESSION: 1. Stable small right hydropneumothorax, with stable tiny apical pneumothorax component and stable small basilar  pleural effusion component. 2. Small left pleural effusion, slightly decreased . 3. Mild-to-moderate bibasilar atelectasis, slightly improved. Electronically Signed   By: Ilona Sorrel M.D.   On: 01/10/2016 08:06    Assessment/Plan 1. S/P minimally invasive mitral valve repair -recovered well -wounds are healing and none are open at present so advised he could return to his swimming regimen  2. S/P percutaneous patent foramen ovale closure -recovered well  3. Bilateral edema of lower extremity -resolved with daily lasix for a week, now advised to take daily prn edema -also on relafen for this bid  4. Paroxysmal atrial fibrillation (HCC) -cont coumadin (monitored by cardiology for this and his valve), baby asa, is off amiodarone and doing well, has been in NSR seemingly as long as I've been seeing him (1.5 yrs)  Labs/tests ordered:   No new today  Next appt: 05/09/2016 med [place]. Erlinda Solinger, D.O. [facility] [address], [place] [address] Cell Phone (Mon-Fri 8am-5pm):  [phone] On Call:  [phone] & follow prompts after 5pm & weekends Office Phone:  [phone] Office Fax:  [phone]

## 2016-02-15 DIAGNOSIS — I480 Paroxysmal atrial fibrillation: Secondary | ICD-10-CM | POA: Diagnosis not present

## 2016-02-15 DIAGNOSIS — K219 Gastro-esophageal reflux disease without esophagitis: Secondary | ICD-10-CM | POA: Diagnosis not present

## 2016-02-15 DIAGNOSIS — I6529 Occlusion and stenosis of unspecified carotid artery: Secondary | ICD-10-CM | POA: Diagnosis not present

## 2016-02-15 DIAGNOSIS — Z7901 Long term (current) use of anticoagulants: Secondary | ICD-10-CM | POA: Diagnosis not present

## 2016-02-15 DIAGNOSIS — I340 Nonrheumatic mitral (valve) insufficiency: Secondary | ICD-10-CM | POA: Diagnosis not present

## 2016-02-15 DIAGNOSIS — Z954 Presence of other heart-valve replacement: Secondary | ICD-10-CM | POA: Diagnosis not present

## 2016-02-15 DIAGNOSIS — E785 Hyperlipidemia, unspecified: Secondary | ICD-10-CM | POA: Diagnosis not present

## 2016-02-15 LAB — PROTIME-INR: INR: 2.5 — AB (ref 0.9–1.1)

## 2016-02-16 ENCOUNTER — Ambulatory Visit (INDEPENDENT_AMBULATORY_CARE_PROVIDER_SITE_OTHER): Payer: Self-pay | Admitting: *Deleted

## 2016-02-16 DIAGNOSIS — Z9889 Other specified postprocedural states: Secondary | ICD-10-CM

## 2016-02-16 DIAGNOSIS — Z5189 Encounter for other specified aftercare: Secondary | ICD-10-CM

## 2016-02-16 DIAGNOSIS — Q211 Atrial septal defect: Secondary | ICD-10-CM

## 2016-02-16 DIAGNOSIS — I340 Nonrheumatic mitral (valve) insufficiency: Secondary | ICD-10-CM

## 2016-02-16 DIAGNOSIS — Q2112 Patent foramen ovale: Secondary | ICD-10-CM

## 2016-02-16 NOTE — Progress Notes (Signed)
[name] called with concerns that his incisions had not fully healed s/p MINI MVR 01/04/16. On exam today every area is very well healed except for a residual scab on the thoracotomy incision. I removed it with a forcep, then excised a small area of fat. There was no sign of infection.  I applied bacitracin and a bandaid and said he could remove that tomorrow.  He has a follow up appointment already scheduled for 02/27/16.

## 2016-02-20 DIAGNOSIS — C44119 Basal cell carcinoma of skin of left eyelid, including canthus: Secondary | ICD-10-CM | POA: Diagnosis not present

## 2016-02-22 ENCOUNTER — Encounter: Payer: Self-pay | Admitting: Internal Medicine

## 2016-02-27 ENCOUNTER — Encounter: Payer: Self-pay | Admitting: Thoracic Surgery (Cardiothoracic Vascular Surgery)

## 2016-02-27 ENCOUNTER — Ambulatory Visit (INDEPENDENT_AMBULATORY_CARE_PROVIDER_SITE_OTHER): Payer: Self-pay | Admitting: Thoracic Surgery (Cardiothoracic Vascular Surgery)

## 2016-02-27 VITALS — BP 135/80 | HR 86 | Resp 20 | Ht 68.0 in | Wt 157.0 lb

## 2016-02-27 DIAGNOSIS — I340 Nonrheumatic mitral (valve) insufficiency: Secondary | ICD-10-CM

## 2016-02-27 DIAGNOSIS — Q2112 Patent foramen ovale: Secondary | ICD-10-CM

## 2016-02-27 DIAGNOSIS — Q211 Atrial septal defect: Secondary | ICD-10-CM

## 2016-02-27 DIAGNOSIS — Z9889 Other specified postprocedural states: Secondary | ICD-10-CM

## 2016-02-27 NOTE — Progress Notes (Signed)
[address]       [place] [address]             [phone]     CARDIOTHORACIC SURGERY OFFICE NOTE  Referring Provider is [name], MD PCP is [name], DO   HPI:  Patient returns to the office today for routine follow-up approximately 2 months status post minimally invasive mitral valve repair with clipping of left atrial appendage and closure of patent foramen ovale on 01/04/2016. His early postoperative recovery in the hospital was notable for a few brief episodes of paroxysmal atrial fibrillation. He was discharged from the hospital on the sixth postoperative day in sinus rhythm on low-dose amiodarone and warfarin anticoagulation. He was last seen here in our office on 01/23/2016 at which time he was doing well. He has been followed carefully by Dr. Wynonia Lawman who has been managing his warfarin anticoagulation. Since his last office visit here the patient has stopped taking amiodarone. He returns to our office today for routine follow-up. He is doing exceptionally well. He states that he is nearly back to his preoperative baseline in terms of exercise tolerance. He has no residual pain in his chest. He has no palpitations or dizzy spells. He is quite active physically and walks every day. He works out in a fitness center and has even tried doing a few pushups. He has no complaints and is overall delighted with his progress. He wants to resume swimming.   Current Outpatient Prescriptions  Medication Sig Dispense Refill  . alfuzosin (UROXATRAL) 10 MG 24 hr tablet Take 10 mg by mouth daily with breakfast. Take one daily to reduce bladder spasm    . aspirin 81 MG tablet Take 81 mg by mouth daily. Take one daily for anticoagulation    . furosemide (LASIX) 20 MG tablet Take 20 mg by mouth daily as needed for edema.     . Levothyroxine Sodium 50 MCG CAPS Take 1 capsule (50 mcg total) by mouth daily before breakfast. 90 capsule 3  . metoprolol succinate  (TOPROL-XL) 25 MG 24 hr tablet Take 12.5 mg by mouth every other day.    . nabumetone (RELAFEN) 750 MG tablet TAKE 1 TABLET TWICE A DAY FOR SWELLING 270 tablet 2  . simvastatin (ZOCOR) 20 MG tablet TAKE 1 TABLET DAILY TO MAINTAIN LOW CHOLESTEROL 90 tablet 1  . solifenacin (VESICARE) 5 MG tablet Take 5 mg by mouth daily.    Marland Kitchen warfarin (COUMADIN) 2 MG tablet Take 1 tablet (2 mg total) by mouth daily at 6 PM. Or as directed. 30 tablet 1   No current facility-administered medications for this visit.      Physical Exam:   BP 135/80 mmHg  Pulse 86  Resp 20  Ht 5\' 8"  (1.727 m)  Wt 157 lb (71.215 kg)  BMI 23.88 kg/m2  SpO2 95%  General:  Well-appearing  Chest:   Clear to auscultation  CV:   Regular rate and rhythm without murmur  Incisions:  Healing nicely  Abdomen:  Soft nontender  Extremities:  Warm and well-perfused  Diagnostic Tests:  n/a   Impression:  Patient is doing exceptionally well approximately 2 months status post minimally invasive mitral valve repair.  Plan:  I have encouraged the patient to continue to gradually increase his physical activity as tolerated without any particular limitations. We have not recommended any changes to the patient's current medications.  I think it would be reasonable to consider stopping Coumadin after  3 months, presuming that he continues to maintain sinus rhythm.  At some point I would favor obtaining a routine postoperative transthoracic echocardiogram to reestablish a new baseline and reassess left ventricular ejection fraction. We will defer this to Dr. Thurman Coyer discretion. The patient will return for routine follow-up next March, approximately 1 year following his surgery.  The patient has been reminded regarding the importance of dental hygiene and the lifelong need for antibiotic prophylaxis for all dental cleanings and other related invasive procedures.     [name], MD 02/27/2016 5:03 PM

## 2016-02-27 NOTE — Patient Instructions (Signed)
You may resume unrestricted physical activity without any particular limitations at this time.  Continue all previous medications without any changes at this time  Endocarditis is a potentially serious infection of heart valves or inside lining of the heart.  It occurs more commonly in patients with diseased heart valves (such as patient's with aortic or mitral valve disease) and in patients who have undergone heart valve repair or replacement.  Certain surgical and dental procedures may put you at risk, such as dental cleaning, other dental procedures, or any surgery involving the respiratory, urinary, gastrointestinal tract, gallbladder or prostate gland.   To minimize your chances for develooping endocarditis, maintain good oral health and seek prompt medical attention for any infections involving the mouth, teeth, gums, skin or urinary tract.    Always notify your doctor or dentist about your underlying heart valve condition before having any invasive procedures. You will need to take antibiotics before certain procedures, including all routine dental cleanings or other dental procedures.  Your cardiologist or dentist should prescribe these antibiotics for you to be taken ahead of time.      

## 2016-03-12 ENCOUNTER — Other Ambulatory Visit: Payer: Self-pay | Admitting: *Deleted

## 2016-03-12 DIAGNOSIS — E039 Hypothyroidism, unspecified: Secondary | ICD-10-CM

## 2016-03-12 MED ORDER — LEVOTHYROXINE SODIUM 50 MCG PO CAPS: 1.0000 | ORAL_CAPSULE | Freq: Every day | ORAL | Status: DC

## 2016-03-12 NOTE — Telephone Encounter (Signed)
Patient requested to be faxed to [facility] 

## 2016-03-15 DIAGNOSIS — K219 Gastro-esophageal reflux disease without esophagitis: Secondary | ICD-10-CM | POA: Diagnosis not present

## 2016-03-15 DIAGNOSIS — I340 Nonrheumatic mitral (valve) insufficiency: Secondary | ICD-10-CM | POA: Diagnosis not present

## 2016-03-15 DIAGNOSIS — I480 Paroxysmal atrial fibrillation: Secondary | ICD-10-CM | POA: Diagnosis not present

## 2016-03-15 DIAGNOSIS — I6529 Occlusion and stenosis of unspecified carotid artery: Secondary | ICD-10-CM | POA: Diagnosis not present

## 2016-03-15 DIAGNOSIS — Z7901 Long term (current) use of anticoagulants: Secondary | ICD-10-CM | POA: Diagnosis not present

## 2016-03-15 DIAGNOSIS — E785 Hyperlipidemia, unspecified: Secondary | ICD-10-CM | POA: Diagnosis not present

## 2016-03-15 DIAGNOSIS — Z954 Presence of other heart-valve replacement: Secondary | ICD-10-CM | POA: Diagnosis not present

## 2016-03-20 DIAGNOSIS — Z85828 Personal history of other malignant neoplasm of skin: Secondary | ICD-10-CM | POA: Diagnosis not present

## 2016-03-20 DIAGNOSIS — C44119 Basal cell carcinoma of skin of left eyelid, including canthus: Secondary | ICD-10-CM | POA: Diagnosis not present

## 2016-03-23 DIAGNOSIS — Z7901 Long term (current) use of anticoagulants: Secondary | ICD-10-CM | POA: Diagnosis not present

## 2016-03-23 DIAGNOSIS — Z954 Presence of other heart-valve replacement: Secondary | ICD-10-CM | POA: Diagnosis not present

## 2016-03-23 DIAGNOSIS — K219 Gastro-esophageal reflux disease without esophagitis: Secondary | ICD-10-CM | POA: Diagnosis not present

## 2016-03-23 DIAGNOSIS — I480 Paroxysmal atrial fibrillation: Secondary | ICD-10-CM | POA: Diagnosis not present

## 2016-03-23 DIAGNOSIS — I340 Nonrheumatic mitral (valve) insufficiency: Secondary | ICD-10-CM | POA: Diagnosis not present

## 2016-03-23 DIAGNOSIS — E785 Hyperlipidemia, unspecified: Secondary | ICD-10-CM | POA: Diagnosis not present

## 2016-03-23 DIAGNOSIS — I6529 Occlusion and stenosis of unspecified carotid artery: Secondary | ICD-10-CM | POA: Diagnosis not present

## 2016-03-30 DIAGNOSIS — Z7901 Long term (current) use of anticoagulants: Secondary | ICD-10-CM | POA: Diagnosis not present

## 2016-03-30 DIAGNOSIS — K219 Gastro-esophageal reflux disease without esophagitis: Secondary | ICD-10-CM | POA: Diagnosis not present

## 2016-03-30 DIAGNOSIS — I480 Paroxysmal atrial fibrillation: Secondary | ICD-10-CM | POA: Diagnosis not present

## 2016-03-30 DIAGNOSIS — E785 Hyperlipidemia, unspecified: Secondary | ICD-10-CM | POA: Diagnosis not present

## 2016-03-30 DIAGNOSIS — I6529 Occlusion and stenosis of unspecified carotid artery: Secondary | ICD-10-CM | POA: Diagnosis not present

## 2016-03-30 DIAGNOSIS — I340 Nonrheumatic mitral (valve) insufficiency: Secondary | ICD-10-CM | POA: Diagnosis not present

## 2016-03-30 DIAGNOSIS — Z954 Presence of other heart-valve replacement: Secondary | ICD-10-CM | POA: Diagnosis not present

## 2016-04-11 ENCOUNTER — Telehealth: Payer: Self-pay | Admitting: *Deleted

## 2016-04-11 NOTE — Telephone Encounter (Signed)
.  left message to have patient return my call.   Pt stated he needed to speak with Dr.Reed

## 2016-04-11 NOTE — Telephone Encounter (Signed)
Pt is asking for an audiology referral for his wife

## 2016-04-23 DIAGNOSIS — C61 Malignant neoplasm of prostate: Secondary | ICD-10-CM | POA: Diagnosis not present

## 2016-04-27 DIAGNOSIS — R350 Frequency of micturition: Secondary | ICD-10-CM | POA: Diagnosis not present

## 2016-04-27 DIAGNOSIS — Z8546 Personal history of malignant neoplasm of prostate: Secondary | ICD-10-CM | POA: Diagnosis not present

## 2016-04-27 DIAGNOSIS — N401 Enlarged prostate with lower urinary tract symptoms: Secondary | ICD-10-CM | POA: Diagnosis not present

## 2016-05-01 DIAGNOSIS — I10 Essential (primary) hypertension: Secondary | ICD-10-CM | POA: Diagnosis not present

## 2016-05-01 DIAGNOSIS — E039 Hypothyroidism, unspecified: Secondary | ICD-10-CM | POA: Diagnosis not present

## 2016-05-01 LAB — BASIC METABOLIC PANEL
BUN: 18 mg/dL (ref 4–21)
Creatinine: 1.1 mg/dL (ref 0.6–1.3)
GLUCOSE: 86 mg/dL
Potassium: 4.3 mmol/L (ref 3.4–5.3)
SODIUM: 142 mmol/L (ref 137–147)

## 2016-05-01 LAB — CBC AND DIFFERENTIAL
HEMATOCRIT: 37 % — AB (ref 41–53)
Hemoglobin: 11.5 g/dL — AB (ref 13.5–17.5)
PLATELETS: 212 10*3/uL (ref 150–399)
WBC: 4.5 10^3/mL

## 2016-05-01 LAB — TSH: TSH: 3.81 u[IU]/mL (ref 0.41–5.90)

## 2016-05-02 ENCOUNTER — Encounter: Payer: Self-pay | Admitting: *Deleted

## 2016-05-09 ENCOUNTER — Non-Acute Institutional Stay: Payer: Medicare Other | Admitting: Internal Medicine

## 2016-05-09 ENCOUNTER — Encounter: Payer: Self-pay | Admitting: Internal Medicine

## 2016-05-09 VITALS — BP 110/60 | HR 78 | Temp 97.8°F | Wt 159.0 lb

## 2016-05-09 DIAGNOSIS — I480 Paroxysmal atrial fibrillation: Secondary | ICD-10-CM | POA: Diagnosis not present

## 2016-05-09 DIAGNOSIS — R000 Tachycardia, unspecified: Secondary | ICD-10-CM | POA: Diagnosis not present

## 2016-05-09 DIAGNOSIS — I10 Essential (primary) hypertension: Secondary | ICD-10-CM

## 2016-05-09 DIAGNOSIS — R600 Localized edema: Secondary | ICD-10-CM | POA: Diagnosis not present

## 2016-05-09 DIAGNOSIS — I340 Nonrheumatic mitral (valve) insufficiency: Secondary | ICD-10-CM | POA: Diagnosis not present

## 2016-05-09 DIAGNOSIS — E039 Hypothyroidism, unspecified: Secondary | ICD-10-CM | POA: Diagnosis not present

## 2016-05-09 DIAGNOSIS — Z9889 Other specified postprocedural states: Secondary | ICD-10-CM

## 2016-05-09 NOTE — Progress Notes (Signed)
Location:   [place] of Service:  Clinic (12)  Provider: Mubarak Bevens L. Mariea Clonts, D.O., C.M.D.  Code Status: DNR Goals of Care:  Advanced Directives 05/09/2016  Does patient have an advance directive? Yes  Type of Paramedic of [place];Living will  Copy of advanced directive(s) in chart? Yes     Chief Complaint  Patient presents with  . Medical Management of Chronic Issues    3 mth follow-up    HPI: Patient is a 82 y.o. male seen today for medical management of chronic diseases.    HR was 138 upon arrival, went down to 120 on manual check, was 78 during EKG which was sinus rhythm.   Has stress.  Is off coumadin per Dr. Wynonia Lawman after surgery when he had a spell of pafib.   Is stressed about his wife.  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     Is frustrated and stress caring for his wife at home.  She is still able to do her adls but requires continued cues and her hearing aides are still not working well.  She will not accept help.    He continues to swim and do his stretching exercises.    Past Medical History  Diagnosis Date  . Pain in joint, lower leg 02/25/2012  . Unspecified hypothyroidism 08/27/2011  . Palpitations 08/14/2010  . Cortical senile cataract 06/27/2010  . Malignant neoplasm of prostate ([place]) 06/21/2010  . Other and unspecified hyperlipidemia 06/21/2010  . Unspecified essential hypertension 06/21/2010  . Unspecified constipation 06/21/2010  . Hypertrophy of prostate without urinary obstruction and other lower urinary tract symptoms (LUTS) 06/21/2010  . Osteoarthrosis, unspecified whether generalized or localized, unspecified site 06/21/2010  . Contracture of palmar fascia 06/21/2010  . Enthesopathy of hip region 11/27/2009  . Urinary frequency 08/14/2008  . Nocturia 2011  . Mitral regurgitation 10/07/2015    Severe MR noted on ECHO with partially flail post leaflet Oct 03 2015   . Essential  hypertension, benign 06/21/2010  . Hypothyroidism 08/27/2011  . Hyperlipidemia LDL goal <100 06/21/2010  . Complication of anesthesia 1955    sodium pentathol ?  Marland Kitchen Dysrhythmia   . Carotid artery disease ([place])   . Aortoiliac occlusive disease ([place])   . GERD (gastroesophageal reflux disease)     occ  . Patent foramen ovale 01/04/2016    Closed at the time of mitral valve repair   . S/P minimally invasive mitral valve repair 01/04/2016    Complex valvuloplasty including triangular resection of flail segment of posterior leaflet, artificial Gore-tex neochord placement x6 and 38mm Sorin Memo 3D ring annuloplasty via right mini thoracotomy approach with closure of PFO and clipping of LA appendage     Past Surgical History  Procedure Laterality Date  . Total knee arthroplasty Right 1955    Dr. Ethel Rana  . Knee arthroscopy with meniscal repair Left 1968  . Cataract extraction w/ intraocular lens  implant, bilateral  2006  . Cardiac catheterization N/A 11/10/2015    Procedure: Right/Left Heart Cath and Coronary Angiography;  Surgeon: Peter M Martinique, MD;  Location: [place] [facility];  Service: Cardiovascular;  Laterality: N/A;  . Tee without cardioversion N/A 11/10/2015    Procedure: TRANSESOPHAGEAL ECHOCARDIOGRAM (TEE);  Surgeon: Skeet Latch, MD;  Location: [facility];  Service: Cardiovascular;  Laterality: N/A;  . Prostate surgery  2000    seed implants  . Mitral valve repair Right 01/04/2016  Procedure: MINIMALLY INVASIVE MITRAL VALVE REPAIR (MVR);  Surgeon: Rexene Alberts, MD;  Location: [place];  Service: Open Heart Surgery;  Laterality: Right;  . Tee without cardioversion N/A 01/04/2016    Procedure: TRANSESOPHAGEAL ECHOCARDIOGRAM (TEE);  Surgeon: Rexene Alberts, MD;  Location: [place];  Service: Open Heart Surgery;  Laterality: N/A;  . Clipping of atrial appendage N/A 01/04/2016    Procedure: CLIPPING OF ATRIAL APPENDAGE;  Surgeon: Rexene Alberts, MD;  Location: [place];  Service: Open Heart  Surgery;  Laterality: N/A;    No Known Allergies    Medication List       This list is accurate as of: 05/09/16 12:07 PM.  Always use your most recent med list.               alfuzosin 10 MG 24 hr tablet  Commonly known as:  UROXATRAL  Take 10 mg by mouth daily with breakfast. Take one daily to reduce bladder spasm     aspirin 81 MG tablet  Take 81 mg by mouth daily. Take one daily for anticoagulation     furosemide 20 MG tablet  Commonly known as:  LASIX  Take 20 mg by mouth daily as needed for edema.     Levothyroxine Sodium 50 MCG Caps  Take 1 capsule (50 mcg total) by mouth daily before breakfast.     metoprolol succinate 25 MG 24 hr tablet  Commonly known as:  TOPROL-XL  Take 12.5 mg by mouth every other day.     nabumetone 750 MG tablet  Commonly known as:  RELAFEN  TAKE 1 TABLET TWICE A DAY FOR SWELLING     oxybutynin 10 MG 24 hr tablet  Commonly known as:  DITROPAN-XL     simvastatin 20 MG tablet  Commonly known as:  ZOCOR  TAKE 1 TABLET DAILY TO MAINTAIN LOW CHOLESTEROL     warfarin 2 MG tablet  Commonly known as:  COUMADIN  Take 1 tablet (2 mg total) by mouth daily at 6 PM. Or as directed.        Review of Systems:  Review of Systems  Constitutional: Negative for fever, chills and malaise/fatigue.  HENT: Negative for congestion and hearing loss.   Eyes: Negative for blurred vision.  Respiratory: Negative for cough and shortness of breath.   Cardiovascular: Positive for leg swelling. Negative for chest pain and palpitations.  Gastrointestinal: Negative for abdominal pain, constipation, blood in stool and melena.  Genitourinary: Negative for dysuria.  Musculoskeletal: Negative for falls.  Skin: Negative for rash.  Neurological: Negative for dizziness, loss of consciousness and weakness.  Endo/Heme/Allergies: Does not bruise/bleed easily.  Psychiatric/Behavioral: Negative for depression and memory loss.       Caregiver stress    Health  Maintenance  Topic Date Due  . INFLUENZA VACCINE  05/22/2016  . TETANUS/TDAP  02/27/2025  . ZOSTAVAX  Completed  . PNA vac Low Risk Adult  Completed    Physical Exam: Filed Vitals:   05/09/16 1147  BP: 110/60  Pulse: 130  Temp: 97.8 F (36.6 C)  TempSrc: Oral  Weight: 159 lb (72.122 kg)  SpO2: 97%   Body mass index is 24.18 kg/(m^2). Physical Exam  Constitutional: He is oriented to person, place, and time. He appears well-developed and well-nourished. No distress.  Cardiovascular: Normal rate, regular rhythm, normal heart sounds and intact distal pulses.   Regular during my exam and 80bpm  Pulmonary/Chest: Effort normal and breath sounds normal. No respiratory distress.  Musculoskeletal: Normal range of motion.  Neurological: He is alert and oriented to person, place, and time.  Skin: Skin is warm and dry.  Psychiatric: He has a normal mood and affect.    Labs reviewed: Basic Metabolic Panel:  Recent Labs  10/25/15  01/04/16 2218 01/05/16 0332 01/05/16 1625  01/07/16 0350 01/08/16 0409 01/10/16 0300 02/07/16 05/01/16  NA 138  < >  --  139 139  < > 139 138 136 141 142  K 4.1  < >  --  4.2 4.0  < > 4.1 3.9 3.9 4.7 4.3  CL  --   < >  --  110 103  < > 105 107 100*  --   --   CO2  --   < >  --  18*  --   < > 26 24 26   --   --   GLUCOSE  --   < >  --  159* 148*  < > 96 88 92  --   --   BUN 18  < >  --  15 19  < > 23* 15 14 19 18   CREATININE 1.1  < > 1.04 1.04 1.09  1.00  < > 1.22 1.01 1.18 1.3 1.1  CALCIUM  --   < >  --  7.4*  --   < > 7.9* 7.9* 8.3*  --   --   MG  --   --  2.7* 2.3 2.4  --   --   --   --   --   --   TSH 6.01*  --   --   --   --   --   --   --   --   --  3.81  < > = values in this interval not displayed. Liver Function Tests:  Recent Labs  10/25/15 01/02/16 1220 01/10/16 0300  AST 23 27 33  ALT 20 23 51  ALKPHOS 66 66 124  BILITOT  --  1.0 0.8  PROT  --  6.2* 5.0*  ALBUMIN  --  3.4* 2.2*   No results for input(s): LIPASE, AMYLASE in the  last 8760 hours. No results for input(s): AMMONIA in the last 8760 hours. CBC:  Recent Labs  01/06/16 0513 01/07/16 0350 01/08/16 0409 05/01/16  WBC 11.9* 7.8 6.9 4.5  HGB 10.2* 9.1* 9.3* 11.5*  HCT 29.4* 28.1* 28.7* 37*  MCV 92.2 92.4 92.0  --   PLT 118* 101* 121* 212   Lipid Panel:  Recent Labs  10/25/15  CHOL 164  HDL 67  LDLCALC 69  TRIG 75   Lab Results  Component Value Date   HGBA1C 5.5 01/02/2016   Assessment/Plan 1. 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 Paroxysmal atrial fibrillation (HCC) -suspect he may have had an episode of afib this am, but he was asymptomatic and it converted spontaneously -I was not able to hear the irregularity myself so unclear if he was simply having sinus tachycardia from overexertion  -advised to f/u with Dr. Wynonia Lawman asap about this and I will copy him on my note  3. Mitral regurgitation -s/p repair and off coumadin now  4. S/P  minimally invasive mitral valve repair -recovered well, but did initially have a spell of afib and some peripheral edema  5. Bilateral edema of lower extremity -1+ bilaterally vs. Normal pre-surgery -has not used any lasix lately -wt up 2 lbs since last appt 2 mos ago, but no other symptoms  6. Hypothyroidism, unspecified hypothyroidism type -continue current tirosint/levothyroxine as TSH was normal 7/11  7. Essential hypertension, benign -bp well controlled on toprol xl 12.5mg  every other day       Labs/tests ordered:   EKG done today Next appt:  3 mos med mgt  Chozen Latulippe L. Dhruvan Gullion, D.O. [facility] [address], [place] [address] Cell Phone (Mon-Fri 8am-5pm):  [phone] On Call:   [phone] & follow prompts after 5pm & weekends Office Phone:  [phone] Office Fax:  [phone]

## 2016-05-21 ENCOUNTER — Encounter: Payer: Self-pay | Admitting: Internal Medicine

## 2016-05-21 DIAGNOSIS — Z7901 Long term (current) use of anticoagulants: Secondary | ICD-10-CM | POA: Diagnosis not present

## 2016-05-21 DIAGNOSIS — Z954 Presence of other heart-valve replacement: Secondary | ICD-10-CM | POA: Diagnosis not present

## 2016-05-21 DIAGNOSIS — I6529 Occlusion and stenosis of unspecified carotid artery: Secondary | ICD-10-CM | POA: Diagnosis not present

## 2016-05-21 DIAGNOSIS — E785 Hyperlipidemia, unspecified: Secondary | ICD-10-CM | POA: Diagnosis not present

## 2016-05-21 DIAGNOSIS — I349 Nonrheumatic mitral valve disorder, unspecified: Secondary | ICD-10-CM | POA: Diagnosis not present

## 2016-05-21 DIAGNOSIS — I340 Nonrheumatic mitral (valve) insufficiency: Secondary | ICD-10-CM | POA: Diagnosis not present

## 2016-05-21 DIAGNOSIS — K219 Gastro-esophageal reflux disease without esophagitis: Secondary | ICD-10-CM | POA: Diagnosis not present

## 2016-05-21 DIAGNOSIS — I480 Paroxysmal atrial fibrillation: Secondary | ICD-10-CM | POA: Diagnosis not present

## 2016-05-21 DIAGNOSIS — Z8546 Personal history of malignant neoplasm of prostate: Secondary | ICD-10-CM | POA: Diagnosis not present

## 2016-06-01 ENCOUNTER — Encounter: Payer: Self-pay | Admitting: Internal Medicine

## 2016-06-07 ENCOUNTER — Encounter: Payer: Self-pay | Admitting: Internal Medicine

## 2016-06-22 ENCOUNTER — Ambulatory Visit: Payer: Medicare Other

## 2016-07-20 ENCOUNTER — Ambulatory Visit (INDEPENDENT_AMBULATORY_CARE_PROVIDER_SITE_OTHER): Payer: Medicare Other

## 2016-07-20 DIAGNOSIS — Z23 Encounter for immunization: Secondary | ICD-10-CM

## 2016-08-08 ENCOUNTER — Encounter: Payer: Self-pay | Admitting: Internal Medicine

## 2016-08-08 ENCOUNTER — Non-Acute Institutional Stay: Payer: Medicare Other | Admitting: Internal Medicine

## 2016-08-08 VITALS — BP 118/60 | HR 132 | Temp 97.8°F | Ht 68.0 in | Wt 157.0 lb

## 2016-08-08 DIAGNOSIS — R000 Tachycardia, unspecified: Secondary | ICD-10-CM | POA: Diagnosis not present

## 2016-08-08 DIAGNOSIS — I10 Essential (primary) hypertension: Secondary | ICD-10-CM | POA: Diagnosis not present

## 2016-08-08 DIAGNOSIS — I480 Paroxysmal atrial fibrillation: Secondary | ICD-10-CM

## 2016-08-08 DIAGNOSIS — E7800 Pure hypercholesterolemia, unspecified: Secondary | ICD-10-CM | POA: Diagnosis not present

## 2016-08-08 DIAGNOSIS — C61 Malignant neoplasm of prostate: Secondary | ICD-10-CM | POA: Diagnosis not present

## 2016-08-08 DIAGNOSIS — R350 Frequency of micturition: Secondary | ICD-10-CM

## 2016-08-08 MED ORDER — SIMVASTATIN 20 MG PO TABS: ORAL_TABLET | ORAL | 3 refills | Status: DC

## 2016-08-08 MED ORDER — NABUMETONE 750 MG PO TABS: ORAL_TABLET | ORAL | 3 refills | Status: DC

## 2016-08-08 NOTE — Progress Notes (Signed)
Location:  [facility]up[facility] of Service:  Clinic (12)  Provider: Ola Fawver L. Mariea Clonts, D.O., C.M.D.  Code Status: DNR Goals of Care:  Advanced Directives 08/08/2016  Does patient have an advance directive? Yes  Type of Advance Directive Living will;Healthcare Power of Attorney  Does patient want to make changes to advanced directive? -  Copy of advanced directive(s) in chart? Yes   Chief Complaint  Patient presents with  . Medical Management of Chronic Issues    3 mth follow-up    HPI: Patient is a 82 y.o. male seen today for medical management of chronic diseases.    He felt like going on the losartan helped with palpitations.    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  Then came here with HR in 130s.  He remains stressed about his wife.  He doesn't feel anything.    Was taken off the vesicare and put on oxybutynin instead.  Does have dry mouth.   Thinks that is a correlation.    Past Medical History:  Diagnosis Date  . Aortoiliac occlusive disease ([place])   . Carotid artery disease ([place])   . Complication of anesthesia 1955   sodium pentathol ?  Marland Kitchen Contracture of palmar fascia 06/21/2010  . Cortical senile cataract 06/27/2010  . Dysrhythmia   . Enthesopathy of hip region 11/27/2009  . Essential hypertension, benign 06/21/2010  . GERD (gastroesophageal reflux disease)    occ  . Hyperlipidemia LDL goal <100 06/21/2010  . Hypertrophy of prostate without urinary obstruction and other lower urinary tract symptoms (LUTS) 06/21/2010  . Hypothyroidism 08/27/2011  . Malignant neoplasm of prostate ([place]) 06/21/2010  . Mitral regurgitation 10/07/2015   Severe MR noted on ECHO with partially flail post leaflet Oct 03 2015   . Nocturia 2011  . Osteoarthrosis, unspecified whether generalized or localized, unspecified site 06/21/2010  . Other and unspecified hyperlipidemia 06/21/2010  . Pain in joint, lower leg 02/25/2012  . Palpitations 08/14/2010  .  Patent foramen ovale 01/04/2016   Closed at the time of mitral valve repair   . S/P minimally invasive mitral valve repair 01/04/2016   Complex valvuloplasty including triangular resection of flail segment of posterior leaflet, artificial Gore-tex neochord placement x6 and 21mm Sorin Memo 3D ring annuloplasty via right mini thoracotomy approach with closure of PFO and clipping of LA appendage   . Unspecified constipation 06/21/2010  . Unspecified essential hypertension 06/21/2010  . Unspecified hypothyroidism 08/27/2011  . Urinary frequency 08/14/2008    Past Surgical History:  Procedure Laterality Date  . CARDIAC CATHETERIZATION N/A 11/10/2015   Procedure: Right/Left Heart Cath and Coronary Angiography;  Surgeon: Peter M Martinique, MD;  Location: [place] [facility];  Service: Cardiovascular;  Laterality: N/A;  . CATARACT EXTRACTION W/ INTRAOCULAR LENS  IMPLANT, BILATERAL  2006  . CLIPPING OF ATRIAL APPENDAGE N/A 01/04/2016   Procedure: CLIPPING OF ATRIAL APPENDAGE;  Surgeon: Rexene Alberts, MD;  Location: [place];  Service: Open Heart Surgery;  Laterality: N/A;  . KNEE ARTHROSCOPY WITH MENISCAL REPAIR Left 1968  . MITRAL VALVE REPAIR Right 01/04/2016   Procedure: MINIMALLY INVASIVE MITRAL VALVE REPAIR (MVR);  Surgeon: Rexene Alberts, MD;  Location: [place];  Service: Open Heart Surgery;  Laterality: Right;  . PROSTATE SURGERY  2000   seed implants  . TEE WITHOUT CARDIOVERSION N/A 11/10/2015   Procedure: TRANSESOPHAGEAL ECHOCARDIOGRAM (TEE);  Surgeon: Skeet Latch, MD;  Location: [place];  Service: Cardiovascular;  Laterality:  N/A;  . TEE WITHOUT CARDIOVERSION N/A 01/04/2016   Procedure: TRANSESOPHAGEAL ECHOCARDIOGRAM (TEE);  Surgeon: Rexene Alberts, MD;  Location: [place];  Service: Open Heart Surgery;  Laterality: N/A;  . TOTAL KNEE ARTHROPLASTY Right 1955   Dr. Ethel Rana    No Known Allergies    Medication List       Accurate as of 08/08/16  1:44 PM. Always use your most recent med list.           alfuzosin 10 MG 24 hr tablet Commonly known as:  UROXATRAL Take 10 mg by mouth daily with breakfast. Take one daily to reduce bladder spasm   aspirin 81 MG tablet Take 81 mg by mouth daily. Take one daily for anticoagulation   Levothyroxine Sodium 50 MCG Caps Take 1 capsule (50 mcg total) by mouth daily before breakfast.   losartan 25 MG tablet Commonly known as:  COZAAR Take 25 mg by mouth daily.   metoprolol succinate 25 MG 24 hr tablet Commonly known as:  TOPROL-XL Take 12.5 mg by mouth every other day.   nabumetone 750 MG tablet Commonly known as:  RELAFEN TAKE 1 TABLET TWICE A DAY FOR SWELLING   oxybutynin 10 MG 24 hr tablet Commonly known as:  DITROPAN-XL   simvastatin 20 MG tablet Commonly known as:  ZOCOR TAKE 1 TABLET DAILY TO MAINTAIN LOW CHOLESTEROL       Review of Systems:  Review of Systems  Constitutional: Negative for chills, fever and malaise/fatigue.  HENT: Negative for congestion and hearing loss.   Eyes: Negative for blurred vision.  Respiratory: Negative for cough and shortness of breath.   Cardiovascular: Negative for chest pain, palpitations, orthopnea, claudication, leg swelling and PND.  Gastrointestinal: Negative for abdominal pain, blood in stool, constipation and melena.  Genitourinary: Negative for dysuria.  Musculoskeletal: Negative for falls.       Still has tightness and discomfort right chest since surgery  Skin: Negative for rash.  Neurological: Negative for dizziness, loss of consciousness and weakness.       Numbness of right medial thigh  Psychiatric/Behavioral: Negative for depression and memory loss.    Health Maintenance  Topic Date Due  . TETANUS/TDAP  02/27/2025  . INFLUENZA VACCINE  Completed  . ZOSTAVAX  Completed  . PNA vac Low Risk Adult  Completed    Physical Exam: Vitals:   08/08/16 1329  BP: 118/60  Pulse: (!) 132  Temp: 97.8 F (36.6 C)  TempSrc: Oral  SpO2: 96%  Weight: 157 lb (71.2 kg)    Height: 5\' 8"  (1.727 m)   Body mass index is 23.87 kg/m. Physical Exam  Constitutional: He is oriented to person, place, and time. He appears well-developed and well-nourished.  Cardiovascular: Regular rhythm, normal heart sounds and intact distal pulses.   Tachycardic in lower 130s  Pulmonary/Chest: Effort normal and breath sounds normal. No respiratory distress.  Abdominal: Bowel sounds are normal.  Musculoskeletal: Normal range of motion.  Neurological: He is alert and oriented to person, place, and time.  Psychiatric: He has a normal mood and affect.    Labs reviewed: Basic Metabolic Panel:  Recent Labs  10/25/15  01/04/16 2218 01/05/16 0332 01/05/16 1625  01/07/16 0350 01/08/16 0409 01/10/16 0300 02/07/16 05/01/16  NA 138  < >  --  139 139  < > 139 138 136 141 142  K 4.1  < >  --  4.2 4.0  < > 4.1 3.9 3.9 4.7 4.3  CL  --   < >  --  110 103  < > 105 107 100*  --   --   CO2  --   < >  --  18*  --   < > 26 24 26   --   --   GLUCOSE  --   < >  --  159* 148*  < > 96 88 92  --   --   BUN 18  < >  --  15 19  < > 23* 15 14 19 18   CREATININE 1.1  < > 1.04 1.04 1.09  1.00  < > 1.22 1.01 1.18 1.3 1.1  CALCIUM  --   < >  --  7.4*  --   < > 7.9* 7.9* 8.3*  --   --   MG  --   --  2.7* 2.3 2.4  --   --   --   --   --   --   TSH 6.01*  --   --   --   --   --   --   --   --   --  3.81  < > = values in this interval not displayed. Liver Function Tests:  Recent Labs  10/25/15 01/02/16 1220 01/10/16 0300  AST 23 27 33  ALT 20 23 51  ALKPHOS 66 66 124  BILITOT  --  1.0 0.8  PROT  --  6.2* 5.0*  ALBUMIN  --  3.4* 2.2*   No results for input(s): LIPASE, AMYLASE in the last 8760 hours. No results for input(s): AMMONIA in the last 8760 hours. CBC:  Recent Labs  01/06/16 0513 01/07/16 0350 01/08/16 0409 05/01/16  WBC 11.9* 7.8 6.9 4.5  HGB 10.2* 9.1* 9.3* 11.5*  HCT 29.4* 28.1* 28.7* 37*  MCV 92.2 92.4 92.0  --   PLT 118* 101* 121* 212   Lipid Panel:  Recent Labs   10/25/15  CHOL 164  HDL 67  LDLCALC 69  TRIG 75   Lab Results  Component Value Date   HGBA1C 5.5 01/02/2016   Assessment/Plan 1. Sinus tachycardia -had the same problem last time, EKG done with sinus rhythm -take beta blocker EVERY day not QOD (thinks today was an off day) and says he never felt tired or fatigued so that wasn't why it was every other day  2. Paroxysmal atrial fibrillation (HCC) -not in this now, on asa only, on toprol which he will now take 12.5mg  DAILY  3. Essential hypertension, benign -bp at goal, excellent today on losartan and toprol  4. Malignant neoplasm of prostate ([place]) -had XRT and beads placed about 10 years ago now and PSA has been 0 by his report when he sees urology  5. Pure hypercholesterolemia - cont zocor, renewals sent  6. Urinary frequency -continues on alfuzosin and had been on vesicare, but due to cost changed to oxybutynin--discussed dry mouth side effect and he plans to get it changed back to vesicare now b/c that has been bothering him  Labs/tests ordered:  No new today Next appt:  08/29/2016 f/u on pulse  Melchor Kirchgessner L. Epsie Walthall, D.O. [facility] [address], [place] [address] Cell Phone (Mon-Fri 8am-5pm):  [phone] On Call:  [phone] & follow prompts after 5pm & weekends Office Phone:  [phone] Office Fax:  [phone]

## 2016-08-08 NOTE — Patient Instructions (Signed)
Increase your metoprolol succinate (toprol xl) to 12.5mg  EVERY DAY.  Monitor your pulse at home.

## 2016-08-21 DIAGNOSIS — Z8546 Personal history of malignant neoplasm of prostate: Secondary | ICD-10-CM | POA: Diagnosis not present

## 2016-08-21 DIAGNOSIS — E785 Hyperlipidemia, unspecified: Secondary | ICD-10-CM | POA: Diagnosis not present

## 2016-08-21 DIAGNOSIS — Z954 Presence of other heart-valve replacement: Secondary | ICD-10-CM | POA: Diagnosis not present

## 2016-08-21 DIAGNOSIS — I6529 Occlusion and stenosis of unspecified carotid artery: Secondary | ICD-10-CM | POA: Diagnosis not present

## 2016-08-21 DIAGNOSIS — K219 Gastro-esophageal reflux disease without esophagitis: Secondary | ICD-10-CM | POA: Diagnosis not present

## 2016-08-21 DIAGNOSIS — Z7901 Long term (current) use of anticoagulants: Secondary | ICD-10-CM | POA: Diagnosis not present

## 2016-08-21 DIAGNOSIS — I480 Paroxysmal atrial fibrillation: Secondary | ICD-10-CM | POA: Diagnosis not present

## 2016-08-21 DIAGNOSIS — I349 Nonrheumatic mitral valve disorder, unspecified: Secondary | ICD-10-CM | POA: Diagnosis not present

## 2016-08-21 DIAGNOSIS — I340 Nonrheumatic mitral (valve) insufficiency: Secondary | ICD-10-CM | POA: Diagnosis not present

## 2016-08-29 ENCOUNTER — Encounter: Payer: Self-pay | Admitting: Internal Medicine

## 2016-08-29 ENCOUNTER — Non-Acute Institutional Stay: Payer: Medicare Other | Admitting: Internal Medicine

## 2016-08-29 VITALS — BP 120/70 | HR 82 | Temp 98.5°F | Wt 158.0 lb

## 2016-08-29 DIAGNOSIS — R000 Tachycardia, unspecified: Secondary | ICD-10-CM

## 2016-08-29 DIAGNOSIS — R350 Frequency of micturition: Secondary | ICD-10-CM

## 2016-08-29 DIAGNOSIS — I10 Essential (primary) hypertension: Secondary | ICD-10-CM | POA: Diagnosis not present

## 2016-08-29 DIAGNOSIS — R221 Localized swelling, mass and lump, neck: Secondary | ICD-10-CM | POA: Diagnosis not present

## 2016-08-29 NOTE — Progress Notes (Signed)
Location:  Occupational psychologist of Service:  Clinic (12)  Provider: Day Deery L. Mariea Clonts, D.O., C.M.D.  Code Status: DNR Goals of Care:  Advanced Directives 08/29/2016  Does patient have an advance directive? Yes  Type of Advance Directive Living will;Healthcare Power of Attorney  Does patient want to make changes to advanced directive? -  Copy of advanced directive(s) in chart? Yes   Chief Complaint  Patient presents with  . Follow-up    3 week follow-up    HPI: Patient is a 82 y.o. male seen today for medical management of chronic diseases--3 week f/u on his tachycardia.  I had advised that he should take his beta blocker daily last time instead of every other day.  HR 82 today.  He's just been tearing down the garden plot pending the construction and was running around like usual.  On losartan 50mg  now per cardiology also.  He has not felt a fast pulse since changing the beta blocker to daily.    He is now on vesicare for his BPH with LUTS.  Off oxybutynin due to dry mouth.  His mouth is moist now.    He has a spot on the back of his neck he wants me to check.  It's a little bit sore.  It's been present for about 2 weeks.  It's no worse with positional change.    Past Medical History:  Diagnosis Date  . Aortoiliac occlusive disease ([place])   . Carotid artery disease ([place])   . Complication of anesthesia 1955   sodium pentathol ?  Marland Kitchen Contracture of palmar fascia 06/21/2010  . Cortical senile cataract 06/27/2010  . Dysrhythmia   . Enthesopathy of hip region 11/27/2009  . Essential hypertension, benign 06/21/2010  . GERD (gastroesophageal reflux disease)    occ  . Hyperlipidemia LDL goal <100 06/21/2010  . Hypertrophy of prostate without urinary obstruction and other lower urinary tract symptoms (LUTS) 06/21/2010  . Hypothyroidism 08/27/2011  . Malignant neoplasm of prostate ([place]) 06/21/2010  . Mitral regurgitation 10/07/2015   Severe MR noted on ECHO with partially  flail post leaflet Oct 03 2015   . Nocturia 2011  . Osteoarthrosis, unspecified whether generalized or localized, unspecified site 06/21/2010  . Other and unspecified hyperlipidemia 06/21/2010  . Pain in joint, lower leg 02/25/2012  . Palpitations 08/14/2010  . Patent foramen ovale 01/04/2016   Closed at the time of mitral valve repair   . S/P minimally invasive mitral valve repair 01/04/2016   Complex valvuloplasty including triangular resection of flail segment of posterior leaflet, artificial Gore-tex neochord placement x6 and 60mm Sorin Memo 3D ring annuloplasty via right mini thoracotomy approach with closure of PFO and clipping of LA appendage   . Unspecified constipation 06/21/2010  . Unspecified essential hypertension 06/21/2010  . Unspecified hypothyroidism 08/27/2011  . Urinary frequency 08/14/2008    Past Surgical History:  Procedure Laterality Date  . CARDIAC CATHETERIZATION N/A 11/10/2015   Procedure: Right/Left Heart Cath and Coronary Angiography;  Surgeon: Peter M Martinique, MD;  Location: [facility];  Service: Cardiovascular;  Laterality: N/A;  . CATARACT EXTRACTION W/ INTRAOCULAR LENS  IMPLANT, BILATERAL  2006  . CLIPPING OF ATRIAL APPENDAGE N/A 01/04/2016   Procedure: CLIPPING OF ATRIAL APPENDAGE;  Surgeon: Rexene Alberts, MD;  Location: [facility];  Service: Open Heart Surgery;  Laterality: N/A;  . KNEE ARTHROSCOPY WITH MENISCAL REPAIR Left 1968  . MITRAL VALVE REPAIR Right 01/04/2016   Procedure: MINIMALLY INVASIVE MITRAL  VALVE REPAIR (MVR);  Surgeon: Rexene Alberts, MD;  Location: [facility];  Service: Open Heart Surgery;  Laterality: Right;  . PROSTATE SURGERY  2000   seed implants  . TEE WITHOUT CARDIOVERSION N/A 11/10/2015   Procedure: TRANSESOPHAGEAL ECHOCARDIOGRAM (TEE);  Surgeon: Skeet Latch, MD;  Location: [facility];  Service: Cardiovascular;  Laterality: N/A;  . TEE WITHOUT CARDIOVERSION N/A 01/04/2016   Procedure: TRANSESOPHAGEAL ECHOCARDIOGRAM (TEE);  Surgeon:  Rexene Alberts, MD;  Location: [facility];  Service: Open Heart Surgery;  Laterality: N/A;  . TOTAL KNEE ARTHROPLASTY Right 1955   Dr. Ethel Rana    No Known Allergies    Medication List       Accurate as of 08/29/16  4:02 PM. Always use your most recent med list.          alfuzosin 10 MG 24 hr tablet Commonly known as:  UROXATRAL Take 10 mg by mouth daily with breakfast. Take one daily to reduce bladder spasm   aspirin 81 MG tablet Take 81 mg by mouth daily. Take one daily for anticoagulation   Levothyroxine Sodium 50 MCG Caps Take 1 capsule (50 mcg total) by mouth daily before breakfast.   losartan 50 MG tablet Commonly known as:  COZAAR Take 50 mg by mouth daily.   metoprolol succinate 25 MG 24 hr tablet Commonly known as:  TOPROL-XL Take 12.5 mg by mouth daily.   nabumetone 750 MG tablet Commonly known as:  RELAFEN TAKE 1 TABLET TWICE A DAY FOR SWELLING   simvastatin 20 MG tablet Commonly known as:  ZOCOR TAKE 1 TABLET DAILY TO MAINTAIN LOW CHOLESTEROL   VESICARE 5 MG tablet Generic drug:  solifenacin Take 5 mg by mouth daily.       Review of Systems:  Review of Systems  Constitutional: Negative for chills, fever and malaise/fatigue.  HENT: Negative for hearing loss.   Eyes: Negative for blurred vision.  Respiratory: Negative for cough and shortness of breath.   Cardiovascular: Positive for leg swelling. Negative for chest pain and palpitations.  Gastrointestinal: Negative for abdominal pain.  Genitourinary: Negative for dysuria, frequency and urgency.  Musculoskeletal: Positive for joint pain. Negative for falls.       Swollen area on left posterior neck along paravertebral muscle  Skin: Negative for itching and rash.  Neurological: Negative for dizziness, loss of consciousness and weakness.  Endo/Heme/Allergies: Does not bruise/bleed easily.  Psychiatric/Behavioral: Negative for depression and memory loss.    Health Maintenance  Topic Date Due  .  TETANUS/TDAP  02/27/2025  . INFLUENZA VACCINE  Completed  . ZOSTAVAX  Completed  . PNA vac Low Risk Adult  Completed    Physical Exam: Vitals:   08/29/16 1557  Weight: 158 lb (71.7 kg)   Body mass index is 24.02 kg/m. Physical Exam  Constitutional: He is oriented to person, place, and time. He appears well-developed and well-nourished. No distress.  Cardiovascular: Normal rate, regular rhythm, normal heart sounds and intact distal pulses.   Pulmonary/Chest: Effort normal and breath sounds normal. No respiratory distress.  Abdominal: Soft. Bowel sounds are normal.  Musculoskeletal: Normal range of motion. He exhibits tenderness.  Mobile swollen mass on left posterior cervical paravertebral muscle, nontender at present  Neurological: He is alert and oriented to person, place, and time.  Skin: Skin is warm and dry. No rash noted. No erythema.  Psychiatric: He has a normal mood and affect.    Labs reviewed: Basic Metabolic Panel:  Recent Labs  10/25/15  01/04/16 2218  01/05/16 0332 01/05/16 1625  01/07/16 0350 01/08/16 0409 01/10/16 0300 02/07/16 05/01/16  NA 138  < >  --  139 139  < > 139 138 136 141 142  K 4.1  < >  --  4.2 4.0  < > 4.1 3.9 3.9 4.7 4.3  CL  --   < >  --  110 103  < > 105 107 100*  --   --   CO2  --   < >  --  18*  --   < > 26 24 26   --   --   GLUCOSE  --   < >  --  159* 148*  < > 96 88 92  --   --   BUN 18  < >  --  15 19  < > 23* 15 14 19 18   CREATININE 1.1  < > 1.04 1.04 1.09  1.00  < > 1.22 1.01 1.18 1.3 1.1  CALCIUM  --   < >  --  7.4*  --   < > 7.9* 7.9* 8.3*  --   --   MG  --   --  2.7* 2.3 2.4  --   --   --   --   --   --   TSH 6.01*  --   --   --   --   --   --   --   --   --  3.81  < > = values in this interval not displayed. Liver Function Tests:  Recent Labs  10/25/15 01/02/16 1220 01/10/16 0300  AST 23 27 33  ALT 20 23 51  ALKPHOS 66 66 124  BILITOT  --  1.0 0.8  PROT  --  6.2* 5.0*  ALBUMIN  --  3.4* 2.2*   No results for  input(s): LIPASE, AMYLASE in the last 8760 hours. No results for input(s): AMMONIA in the last 8760 hours. CBC:  Recent Labs  01/06/16 0513 01/07/16 0350 01/08/16 0409 05/01/16  WBC 11.9* 7.8 6.9 4.5  HGB 10.2* 9.1* 9.3* 11.5*  HCT 29.4* 28.1* 28.7* 37*  MCV 92.2 92.4 92.0  --   PLT 118* 101* 121* 212   Lipid Panel:  Recent Labs  10/25/15  CHOL 164  HDL 67  LDLCALC 69  TRIG 75   Lab Results  Component Value Date   HGBA1C 5.5 01/02/2016    Assessment/Plan 1. Localized swelling, mass and lump, neck - may be a cyst - check ultrasound to be sure - no significant adenopathy elsewhere and does not feel like a typical lymph node - US Soft Tissue Head/Neck; Future  2. Sinus tachycardia -resolved with daily toprol xl 12.5mg  not qod  -again explained that he cannot take a higher dose every other day b/c med is a 24 hr medication  3. Essential hypertension, benign -bp at goal with current therapy--no new changes made today -monitor  4. Urinary frequency -back on vesicare with resolution of dry mouth he was getting from oxybutynin  Labs/tests ordered:  No new Next appt:  12/12/2016  Paden Senger L. Baltazar Pekala, D.O. [facility] [address], [place] [address] Cell Phone (Mon-Fri 8am-5pm):  [phone] On Call:  [phone] & follow prompts after 5pm & weekends Office Phone:  [phone] Office Fax:  [phone]

## 2016-09-03 ENCOUNTER — Ambulatory Visit
Admission: RE | Admit: 2016-09-03 | Discharge: 2016-09-03 | Disposition: A | Discharge status: Home / Self Care | Payer: Medicare Other | Source: Ambulatory Visit | Attending: Internal Medicine | Admitting: Internal Medicine

## 2016-09-03 DIAGNOSIS — R221 Localized swelling, mass and lump, neck: Secondary | ICD-10-CM | POA: Diagnosis not present

## 2016-09-06 ENCOUNTER — Encounter: Payer: Self-pay | Admitting: *Deleted

## 2016-12-07 DIAGNOSIS — D225 Melanocytic nevi of trunk: Secondary | ICD-10-CM | POA: Diagnosis not present

## 2016-12-07 DIAGNOSIS — D485 Neoplasm of uncertain behavior of skin: Secondary | ICD-10-CM | POA: Diagnosis not present

## 2016-12-07 DIAGNOSIS — D3612 Benign neoplasm of peripheral nerves and autonomic nervous system, upper limb, including shoulder: Secondary | ICD-10-CM | POA: Diagnosis not present

## 2016-12-07 DIAGNOSIS — D1801 Hemangioma of skin and subcutaneous tissue: Secondary | ICD-10-CM | POA: Diagnosis not present

## 2016-12-07 DIAGNOSIS — L820 Inflamed seborrheic keratosis: Secondary | ICD-10-CM | POA: Diagnosis not present

## 2016-12-07 DIAGNOSIS — Z85828 Personal history of other malignant neoplasm of skin: Secondary | ICD-10-CM | POA: Diagnosis not present

## 2016-12-07 DIAGNOSIS — L814 Other melanin hyperpigmentation: Secondary | ICD-10-CM | POA: Diagnosis not present

## 2016-12-07 DIAGNOSIS — L821 Other seborrheic keratosis: Secondary | ICD-10-CM | POA: Diagnosis not present

## 2016-12-07 DIAGNOSIS — D2262 Melanocytic nevi of left upper limb, including shoulder: Secondary | ICD-10-CM | POA: Diagnosis not present

## 2016-12-12 ENCOUNTER — Encounter: Payer: Self-pay | Admitting: Internal Medicine

## 2016-12-12 ENCOUNTER — Non-Acute Institutional Stay: Payer: Medicare Other | Admitting: Internal Medicine

## 2016-12-12 VITALS — BP 120/60 | HR 117 | Temp 97.3°F | Ht 68.0 in | Wt 160.0 lb

## 2016-12-12 DIAGNOSIS — Z0000 Encounter for general adult medical examination without abnormal findings: Secondary | ICD-10-CM | POA: Diagnosis not present

## 2016-12-12 DIAGNOSIS — E039 Hypothyroidism, unspecified: Secondary | ICD-10-CM

## 2016-12-12 MED ORDER — LEVOTHYROXINE SODIUM 50 MCG PO CAPS: 1.0000 | ORAL_CAPSULE | Freq: Every day | ORAL | 3 refills | Status: DC

## 2016-12-12 NOTE — Progress Notes (Signed)
Location:  Occupational psychologist of Service:  Clinic (12) Provider: Nachman Sundt L. Mariea Clonts, D.O., C.M.D.  Patient Care Team: Gayland Curry, DO as PCP - General (Geriatric Medicine) Well Reeves Jeri Cos, NP as Nurse Practitioner (Geriatric Medicine) [name], MD as Consulting Physician (Cardiology) Raynelle Bring, MD as Consulting Physician (Urology)  Extended Emergency Contact Information Primary Emergency Contact: Armstrong,Joan Address: [address]          [place],  [address] Johnnette Litter of [place] Phone: [phone] Relation: Spouse  Code Status: DNR Goals of Care: Advanced Directive information Advanced Directives 12/12/2016  Does Patient Have a Medical Advance Directive? Yes  Type of Paramedic of [place];Living will  Does patient want to make changes to medical advance directive? -  Copy of [place] in Chart? Yes   Chief Complaint  Patient presents with  . Annual Exam    wellness exam  . MMSE    28/30 passed clock    HPI: Patient is a 83 y.o. male seen in today for an annual wellness exam.    Needs levothyroxine changed to another generic due to cost.    Depression screen [facility] 2/9 12/12/2016 08/29/2016 08/08/2016 02/01/2016 11/02/2015  Decreased Interest 0 0 0 0 0  Down, Depressed, Hopeless 0 0 0 0 0  PHQ - 2 Score 0 0 0 0 0    Fall Risk  08/29/2016 08/08/2016 02/01/2016 11/02/2015 10/26/2014  Falls in the past year? No No No No No   MMSE - Mini Mental State Exam 12/12/2016 11/02/2015  Orientation to time 5 5  Orientation to Place 5 5  Registration 3 3  Attention/ Calculation 5 5  Recall 1 1  Language- name 2 objects 2 2  Language- repeat 1 1  Language- follow 3 step command 3 3  Language- read & follow direction 1 1  Write a sentence 1 1  Copy design 1 1  Total score 28 28     Health Maintenance  Topic Date Due  . TETANUS/TDAP  02/27/2025  .  INFLUENZA VACCINE  Completed  . PNA vac Low Risk Adult  Completed    Functional Status Survey: Is the patient deaf or have difficulty hearing?: No Does the patient have difficulty seeing, even when wearing glasses/contacts?: No Does the patient have difficulty concentrating, remembering, or making decisions?: Yes (admits memory slipping a little bit) Does the patient have difficulty walking or climbing stairs?: No Does the patient have difficulty dressing or bathing?: No Does the patient have difficulty doing errands alone such as visiting a doctor's office or shopping?: No Current Exercise Habits: Home exercise routine (swimming, calisthenics), Type of exercise: strength training/weights;Other - see comments, Time (Minutes): 25 (25), Frequency (Times/Week): 7, Weekly Exercise (Minutes/Week): 175, Intensity: Moderate Exercise limited by: None identified;Other - see comments Diet? Regular [facility] No exam data present Hearing: ok Dentition:  ok Pain: none  Past Medical History:  Diagnosis Date  . Aortoiliac occlusive disease ([place])   . Carotid artery disease ([place])   . Complication of anesthesia 1955   sodium pentathol ?  Marland Kitchen Contracture of palmar fascia 06/21/2010  . Cortical senile cataract 06/27/2010  . Dysrhythmia   . Enthesopathy of hip region 11/27/2009  . Essential hypertension, benign 06/21/2010  . GERD (gastroesophageal reflux disease)    occ  . Hyperlipidemia LDL goal <100 06/21/2010  . Hypertrophy of prostate without urinary obstruction and other lower urinary tract symptoms (  LUTS) 06/21/2010  . Hypothyroidism 08/27/2011  . Malignant neoplasm of prostate ([place]) 06/21/2010  . Mitral regurgitation 10/07/2015   Severe MR noted on ECHO with partially flail post leaflet Oct 03 2015   . Nocturia 2011  . Osteoarthrosis, unspecified whether generalized or localized, unspecified site 06/21/2010  . Other and unspecified hyperlipidemia 06/21/2010  . Pain in joint, lower leg 02/25/2012  .  Palpitations 08/14/2010  . Patent foramen ovale 01/04/2016   Closed at the time of mitral valve repair   . S/P minimally invasive mitral valve repair 01/04/2016   Complex valvuloplasty including triangular resection of flail segment of posterior leaflet, artificial Gore-tex neochord placement x6 and 13mm Sorin Memo 3D ring annuloplasty via right mini thoracotomy approach with closure of PFO and clipping of LA appendage   . Unspecified constipation 06/21/2010  . Unspecified essential hypertension 06/21/2010  . Unspecified hypothyroidism 08/27/2011  . Urinary frequency 08/14/2008    Past Surgical History:  Procedure Laterality Date  . CARDIAC CATHETERIZATION N/A 11/10/2015   Procedure: Right/Left Heart Cath and Coronary Angiography;  Surgeon: Peter M Martinique, MD;  Location: [facility];  Service: Cardiovascular;  Laterality: N/A;  . CATARACT EXTRACTION W/ INTRAOCULAR LENS  IMPLANT, BILATERAL  2006  . CLIPPING OF ATRIAL APPENDAGE N/A 01/04/2016   Procedure: CLIPPING OF ATRIAL APPENDAGE;  Surgeon: Rexene Alberts, MD;  Location: [facility];  Service: Open Heart Surgery;  Laterality: N/A;  . KNEE ARTHROSCOPY WITH MENISCAL REPAIR Left 1968  . MITRAL VALVE REPAIR Right 01/04/2016   Procedure: MINIMALLY INVASIVE MITRAL VALVE REPAIR (MVR);  Surgeon: Rexene Alberts, MD;  Location: [facility];  Service: Open Heart Surgery;  Laterality: Right;  . PROSTATE SURGERY  2000   seed implants  . TEE WITHOUT CARDIOVERSION N/A 11/10/2015   Procedure: TRANSESOPHAGEAL ECHOCARDIOGRAM (TEE);  Surgeon: Skeet Latch, MD;  Location: [facility];  Service: Cardiovascular;  Laterality: N/A;  . TEE WITHOUT CARDIOVERSION N/A 01/04/2016   Procedure: TRANSESOPHAGEAL ECHOCARDIOGRAM (TEE);  Surgeon: Rexene Alberts, MD;  Location: [facility];  Service: Open Heart Surgery;  Laterality: N/A;  . TOTAL KNEE ARTHROPLASTY Right 1955   Dr. Ethel Rana    Family History  Problem Relation Age of Onset  . Heart disease Mother     myocardial  infarction  . Heart disease Father     myocardial infarction    Social History   Social History  . Marital status: Married    Spouse name: N/A  . Number of children: N/A  . Years of education: N/A   Occupational History  . retired Engineer, materials at [employer]  . Smoking status: Former Smoker    Packs/day: 0.50    Years: 5.00    Types: Cigarettes    Quit date: 09/04/1969  . Smokeless tobacco: Never Used  . Alcohol use 1.2 oz/week    2 Shots of liquor per week     Comment: 2 daily  . Drug use: No  . Sexual activity: Not Asked   Other Topics Concern  . None   Social History Narrative   Lives at [facility] since 05/2010   Married [name]    Has living will, POA   Retired from [employer] after 29 years   Stopped smoking 1970   Exercise run 1 1/2 mile every other day, swim 15 minutes every other day, use machines in gym   Alcohol 2 drinks daily    reports that he quit smoking about 47  years ago. His smoking use included Cigarettes. He has a 2.50 pack-year smoking history. He has never used smokeless tobacco. He reports that he drinks about 1.2 oz of alcohol per week . He reports that he does not use drugs.  Allergies as of 12/12/2016   No Known Allergies     Medication List       Accurate as of 12/12/16 11:59 PM. Always use your most recent med list.          alfuzosin 10 MG 24 hr tablet Commonly known as:  UROXATRAL Take 10 mg by mouth daily with breakfast. Take one daily to reduce bladder spasm   aspirin 81 MG tablet Take 81 mg by mouth daily. Take one daily for anticoagulation   Levothyroxine Sodium 50 MCG Caps Take 1 capsule (50 mcg total) by mouth daily before breakfast.   losartan 50 MG tablet Commonly known as:  COZAAR Take 50 mg by mouth daily.   metoprolol succinate 25 MG 24 hr tablet Commonly known as:  TOPROL-XL Take 12.5 mg by mouth daily.   nabumetone 750 MG tablet Commonly known as:  RELAFEN TAKE 1 TABLET  TWICE A DAY FOR SWELLING   simvastatin 20 MG tablet Commonly known as:  ZOCOR TAKE 1 TABLET DAILY TO MAINTAIN LOW CHOLESTEROL   VESICARE 5 MG tablet Generic drug:  solifenacin Take 5 mg by mouth daily.       Review of Systems:  Review of Systems  Constitutional: Negative for chills, fever and malaise/fatigue.  HENT: Negative for congestion and hearing loss.   Eyes: Negative for blurred vision.  Respiratory: Negative for cough and shortness of breath.   Cardiovascular: Negative for chest pain, palpitations and leg swelling.  Gastrointestinal: Negative for abdominal pain, blood in stool, constipation, diarrhea, heartburn, melena, nausea and vomiting.  Genitourinary: Negative for dysuria, frequency and urgency.  Musculoskeletal: Negative for falls, joint pain and myalgias.  Skin: Negative for rash.  Neurological: Negative for dizziness, tingling, tremors, sensory change, speech change, focal weakness, seizures, loss of consciousness, weakness and headaches.  Endo/Heme/Allergies: Does not bruise/bleed easily.  Psychiatric/Behavioral: Positive for memory loss. Negative for depression and suicidal ideas. The patient is not nervous/anxious.        Per pt; has caregiver stress also with wife who has dementia    Physical Exam: Vitals:   12/12/16 1425  BP: 120/60  Pulse: (!) 117  Temp: 97.3 F (36.3 C)  TempSrc: Oral  SpO2: 96%  Weight: 160 lb (72.6 kg)  Height: 5\' 8"  (1.727 m)   Body mass index is 24.33 kg/m. Physical Exam  Constitutional: He is oriented to person, place, and time. He appears well-developed and well-nourished. No distress.  HENT:  Head: Normocephalic and atraumatic.  Right Ear: External ear normal.  Left Ear: External ear normal.  Nose: Nose normal.  Mouth/Throat: Oropharynx is clear and moist. No oropharyngeal exudate.  Eyes: Conjunctivae and EOM are normal. Pupils are equal, round, and reactive to light. Right eye exhibits no discharge. Left eye exhibits  no discharge. No scleral icterus.  Neck: Normal range of motion. Neck supple. No JVD present. No tracheal deviation present. No thyromegaly present.  Cardiovascular: Normal rate, regular rhythm, normal heart sounds and intact distal pulses.   Pulmonary/Chest: Effort normal and breath sounds normal. No respiratory distress. He has no rales.  Abdominal: Soft. Bowel sounds are normal. He exhibits no distension and no mass. There is no tenderness. There is no rebound and no guarding. No hernia.  Musculoskeletal: Normal  range of motion. He exhibits no edema, tenderness or deformity.  Lymphadenopathy:    He has no cervical adenopathy.  Neurological: He is alert and oriented to person, place, and time. He displays normal reflexes. No cranial nerve deficit or sensory deficit. He exhibits normal muscle tone. Coordination normal.  Skin: Skin is warm and dry. Capillary refill takes less than 2 seconds.    Labs reviewed: Basic Metabolic Panel:  Recent Labs  01/04/16 2218 01/05/16 0332 01/05/16 1625  01/07/16 0350 01/08/16 0409 01/10/16 0300 02/07/16 05/01/16  NA  --  139 139  < > 139 138 136 141 142  K  --  4.2 4.0  < > 4.1 3.9 3.9 4.7 4.3  CL  --  110 103  < > 105 107 100*  --   --   CO2  --  18*  --   < > 26 24 26   --   --   GLUCOSE  --  159* 148*  < > 96 88 92  --   --   BUN  --  15 19  < > 23* 15 14 19 18   CREATININE 1.04 1.04 1.09  1.00  < > 1.22 1.01 1.18 1.3 1.1  CALCIUM  --  7.4*  --   < > 7.9* 7.9* 8.3*  --   --   MG 2.7* 2.3 2.4  --   --   --   --   --   --   TSH  --   --   --   --   --   --   --   --  3.81  < > = values in this interval not displayed. Liver Function Tests:  Recent Labs  01/02/16 1220 01/10/16 0300  AST 27 33  ALT 23 51  ALKPHOS 66 124  BILITOT 1.0 0.8  PROT 6.2* 5.0*  ALBUMIN 3.4* 2.2*   No results for input(s): LIPASE, AMYLASE in the last 8760 hours. No results for input(s): AMMONIA in the last 8760 hours. CBC:  Recent Labs  01/06/16 0513  01/07/16 0350 01/08/16 0409 05/01/16  WBC 11.9* 7.8 6.9 4.5  HGB 10.2* 9.1* 9.3* 11.5*  HCT 29.4* 28.1* 28.7* 37*  MCV 92.2 92.4 92.0  --   PLT 118* 101* 121* 212   Lipid Panel: No results for input(s): CHOL, HDL, LDLCALC, TRIG, CHOLHDL, LDLDIRECT in the last 8760 hours. Lab Results  Component Value Date   HGBA1C 5.5 01/02/2016    Assessment/Plan 1. Medicare annual wellness visit, subsequent -completed -pt has already gotten the first of the series of two shingrix vaccines and otherwise up to date on health maintenance  2. Hypothyroidism, unspecified type Change to a different brand of levothyroxine due to cost of current one--sent to mail order pharmacy as levothyroxine generic  Labs/tests ordered:  No new Next appt: 04/17/2017  Deaaron Fulghum L. Kelsey Edman, D.O. [facility] [address], [place] [address] Cell Phone (Mon-Fri 8am-5pm):  [phone] On Call:  [phone] & follow prompts after 5pm & weekends Office Phone:  [phone] Office Fax:  [phone]

## 2016-12-31 ENCOUNTER — Ambulatory Visit (INDEPENDENT_AMBULATORY_CARE_PROVIDER_SITE_OTHER): Payer: Medicare Other | Admitting: Thoracic Surgery (Cardiothoracic Vascular Surgery)

## 2016-12-31 ENCOUNTER — Encounter: Payer: Self-pay | Admitting: Thoracic Surgery (Cardiothoracic Vascular Surgery)

## 2016-12-31 VITALS — BP 117/79 | HR 77 | Resp 16 | Ht 68.0 in | Wt 160.0 lb

## 2016-12-31 DIAGNOSIS — Q211 Atrial septal defect: Secondary | ICD-10-CM

## 2016-12-31 DIAGNOSIS — Q2112 Patent foramen ovale: Secondary | ICD-10-CM

## 2016-12-31 DIAGNOSIS — Z9889 Other specified postprocedural states: Secondary | ICD-10-CM | POA: Diagnosis not present

## 2016-12-31 NOTE — Patient Instructions (Signed)

## 2016-12-31 NOTE — Progress Notes (Signed)
[address]       [place] [address]             [phone]     CARDIOTHORACIC SURGERY OFFICE NOTE  Referring Provider is [name], MD PCP is [name], DO   HPI:  Patient is an 83 year old male with history of severe primary mitral regurgitation caused by mitral valve prolapse who returns to the office today for routine follow-up approximately one year status post minimally invasive mitral valve repair with clipping of left atrial appendage and closure of patent foramen ovale on 01/04/2016. The patient's early postoperative recovery was notable for a few brief episodes of paroxysmal atrial fibrillation. Since hospital discharge she has been followed carefully by Dr. Wynonia Lawman and he was last seen here in our office on 02/27/2016 at which time he was doing very well.  He underwent a routine follow-up echocardiogram at Dr. Thurman Coyer office on 05/21/2016. By report the mitral valve repair remained intact with no residual mitral regurgitation. There was moderate concentric left ventricular hypertrophy and a significant drop in left ventricular ejection fraction postoperatively with ejection fraction estimated 30-35%. No other significant abnormalities were noted.  Patient returns office today for routine follow-up. He reports that overall he is doing exceptionally well. He exercises on a regular basis and he reports actually no exertional shortness of breath or chest discomfort. He has not had any palpitations. His only complaint is that he still has numbness located along the medial aspect of the right thigh that dates back to the time of his surgery. He also can appreciate some firm tissue in the right chest wall corresponding to scar tissue from his surgical wounds incision. Overall he is doing exceptionally well.   Current Outpatient Prescriptions  Medication Sig Dispense Refill  . alfuzosin (UROXATRAL) 10 MG 24 hr tablet Take 10 mg by mouth daily with  breakfast. Take one daily to reduce bladder spasm    . aspirin 81 MG tablet Take 81 mg by mouth daily. Take one daily for anticoagulation    . Levothyroxine Sodium 50 MCG CAPS Take 1 capsule (50 mcg total) by mouth daily before breakfast. 90 capsule 3  . losartan (COZAAR) 50 MG tablet Take 50 mg by mouth daily.     . metoprolol succinate (TOPROL-XL) 25 MG 24 hr tablet Take 12.5 mg by mouth daily.    . nabumetone (RELAFEN) 750 MG tablet TAKE 1 TABLET TWICE A DAY FOR SWELLING 180 tablet 3  . simvastatin (ZOCOR) 20 MG tablet TAKE 1 TABLET DAILY TO MAINTAIN LOW CHOLESTEROL 90 tablet 3  . VESICARE 5 MG tablet Take 5 mg by mouth daily.      No current facility-administered medications for this visit.       Physical Exam:   BP 117/79 (BP Location: Right Arm, Patient Position: Sitting, Cuff Size: Large)   Pulse 77   Resp 16   Ht 5\' 8"  (1.727 m)   Wt 160 lb (72.6 kg)   SpO2 96% Comment: ON RA  BMI 24.33 kg/m   General:  Well-appearing  Chest:   Clear to auscultation  CV:   Tachycardic but regular rate and rhythm without murmur  Incisions:  Completely healed  Abdomen:  Soft nontender  Extremities:  Warm and well-perfused  Diagnostic Tests:  Report from transthoracic echocardiogram performed 05/21/2016 at Dr. Thurman Coyer office is reviewed. By report there was moderate concentric left ventricular hypertrophy with moderate lobe left ventricular systolic dysfunction, ejection fraction  estimated 30-35% which was decreased in comparison with prior to surgery. There was intact mitral valve repair with no residual mitral regurgitation. No other significant abnormalities were noted.  Impression:  Patient is doing well approximately 1 year following minimally invasive mitral valve repair. He is back to normal vigorous physical activity and he reports no symptoms of exertional shortness of breath or chest discomfort. He has some persistent numbness along the medial aspect of the right thigh consistent  with likely nerve entrapment or injury involving the medial cutaneous femoral nerve which traverses the femoral canal and was likely affected by right groin incision performed at the time of surgery. He overall looks exceptionally well. He is somewhat tachycardic on exam and he has reportedly discussed this at length with Dr. Wynonia Lawman who is been adjusting his beta blocker.    Plan:  We have not recommended any changes to the patient's current medications at this time.  The patient has been reminded regarding the importance of dental hygiene and the lifelong need for antibiotic prophylaxis for all dental cleanings and other related invasive procedures.  In the future the patient will call and return to see [place] as needed.  I spent in excess of 15 minutes during the conduct of this office consultation and >50% of this time involved direct face-to-face encounter with the patient for counseling and/or coordination of their care.    [name], MD 12/31/2016 10:31 AM

## 2017-01-04 IMAGING — CR DG CHEST 2V
2 series · 2 of 2 positions shown · non-contrast
Comparison: CT chest 11/04/2015

CLINICAL DATA: Preoperative assessment for minimally invasive
mitral valve repair, mitral regurgitation, prostate cancer,
essential hypertension, carotid artery disease, former smoker

EXAM:
CHEST  2 VIEW

[w chest pa]
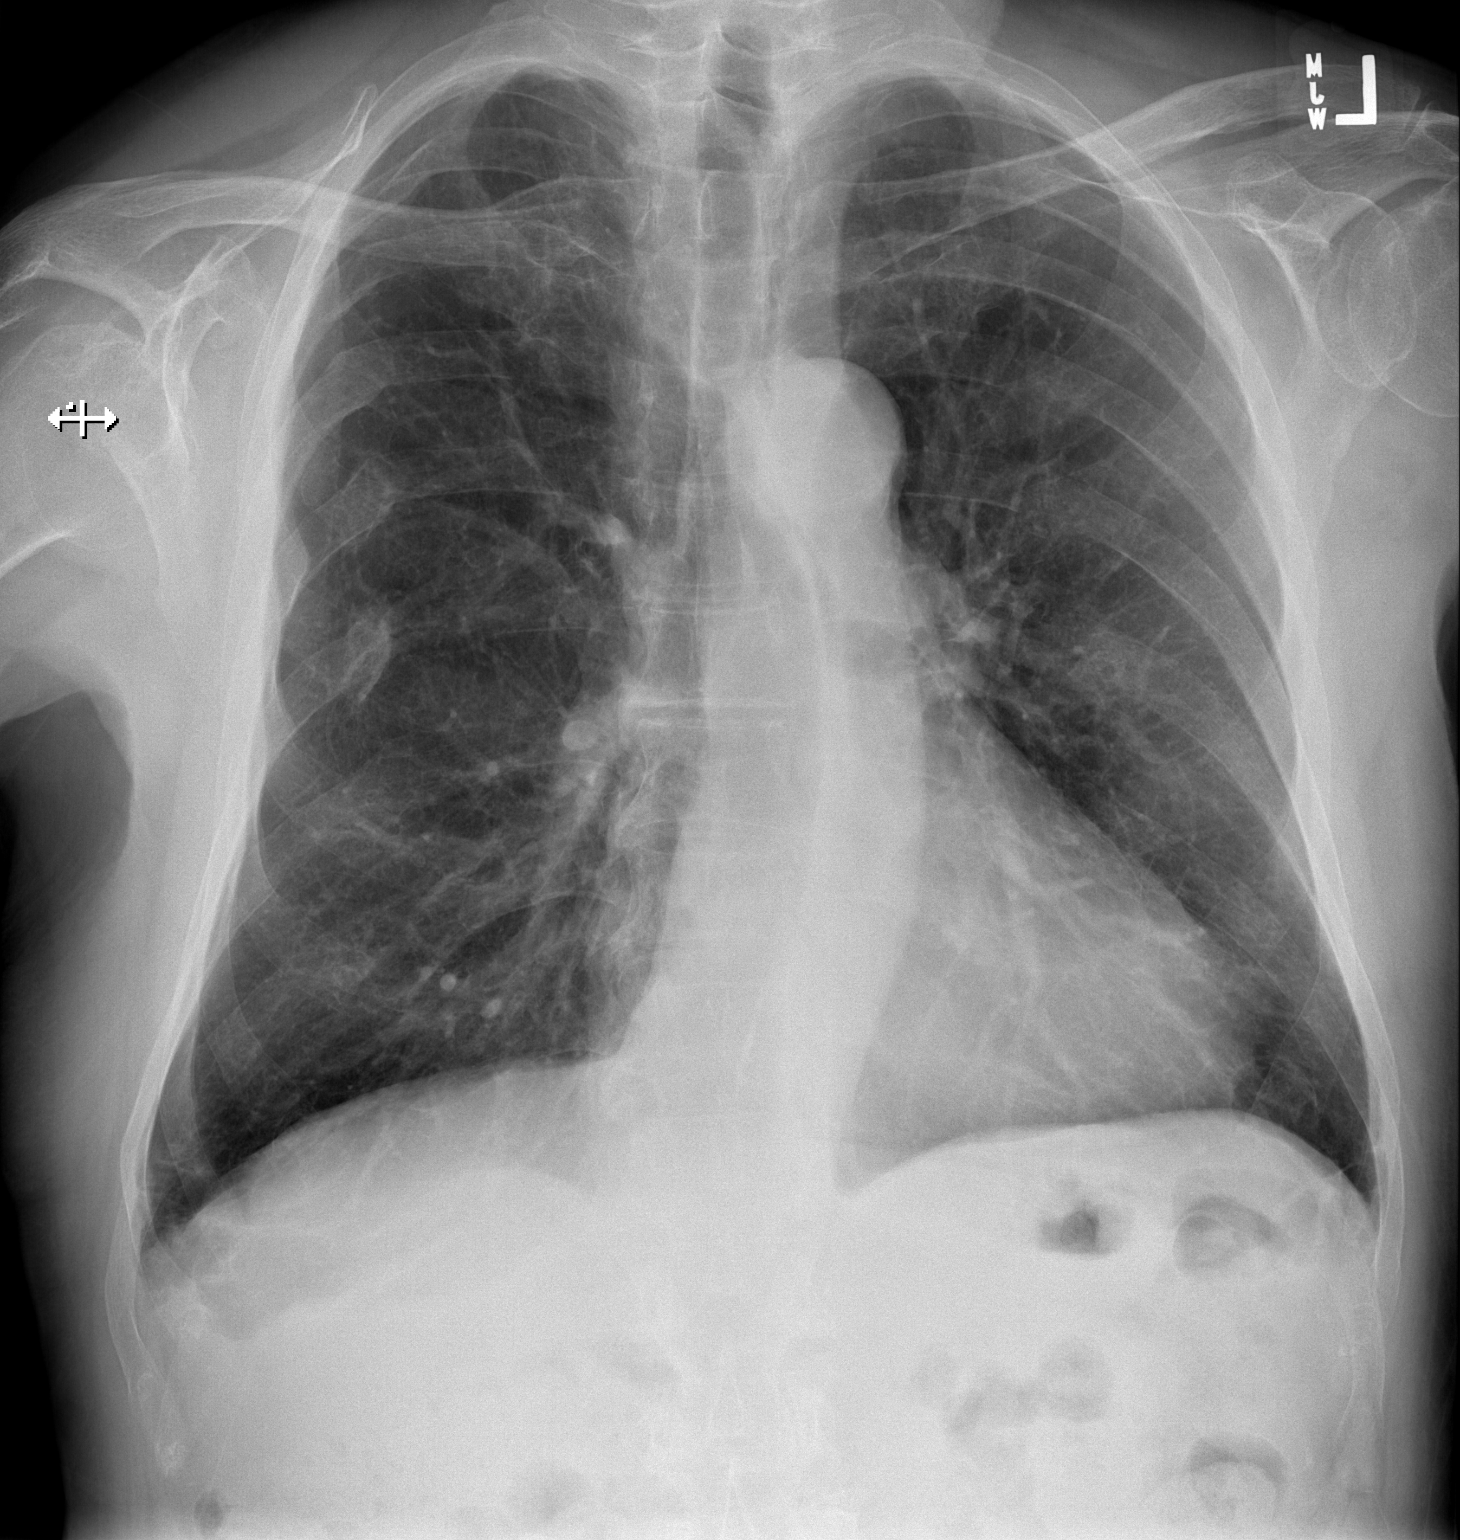

[w chest lat]
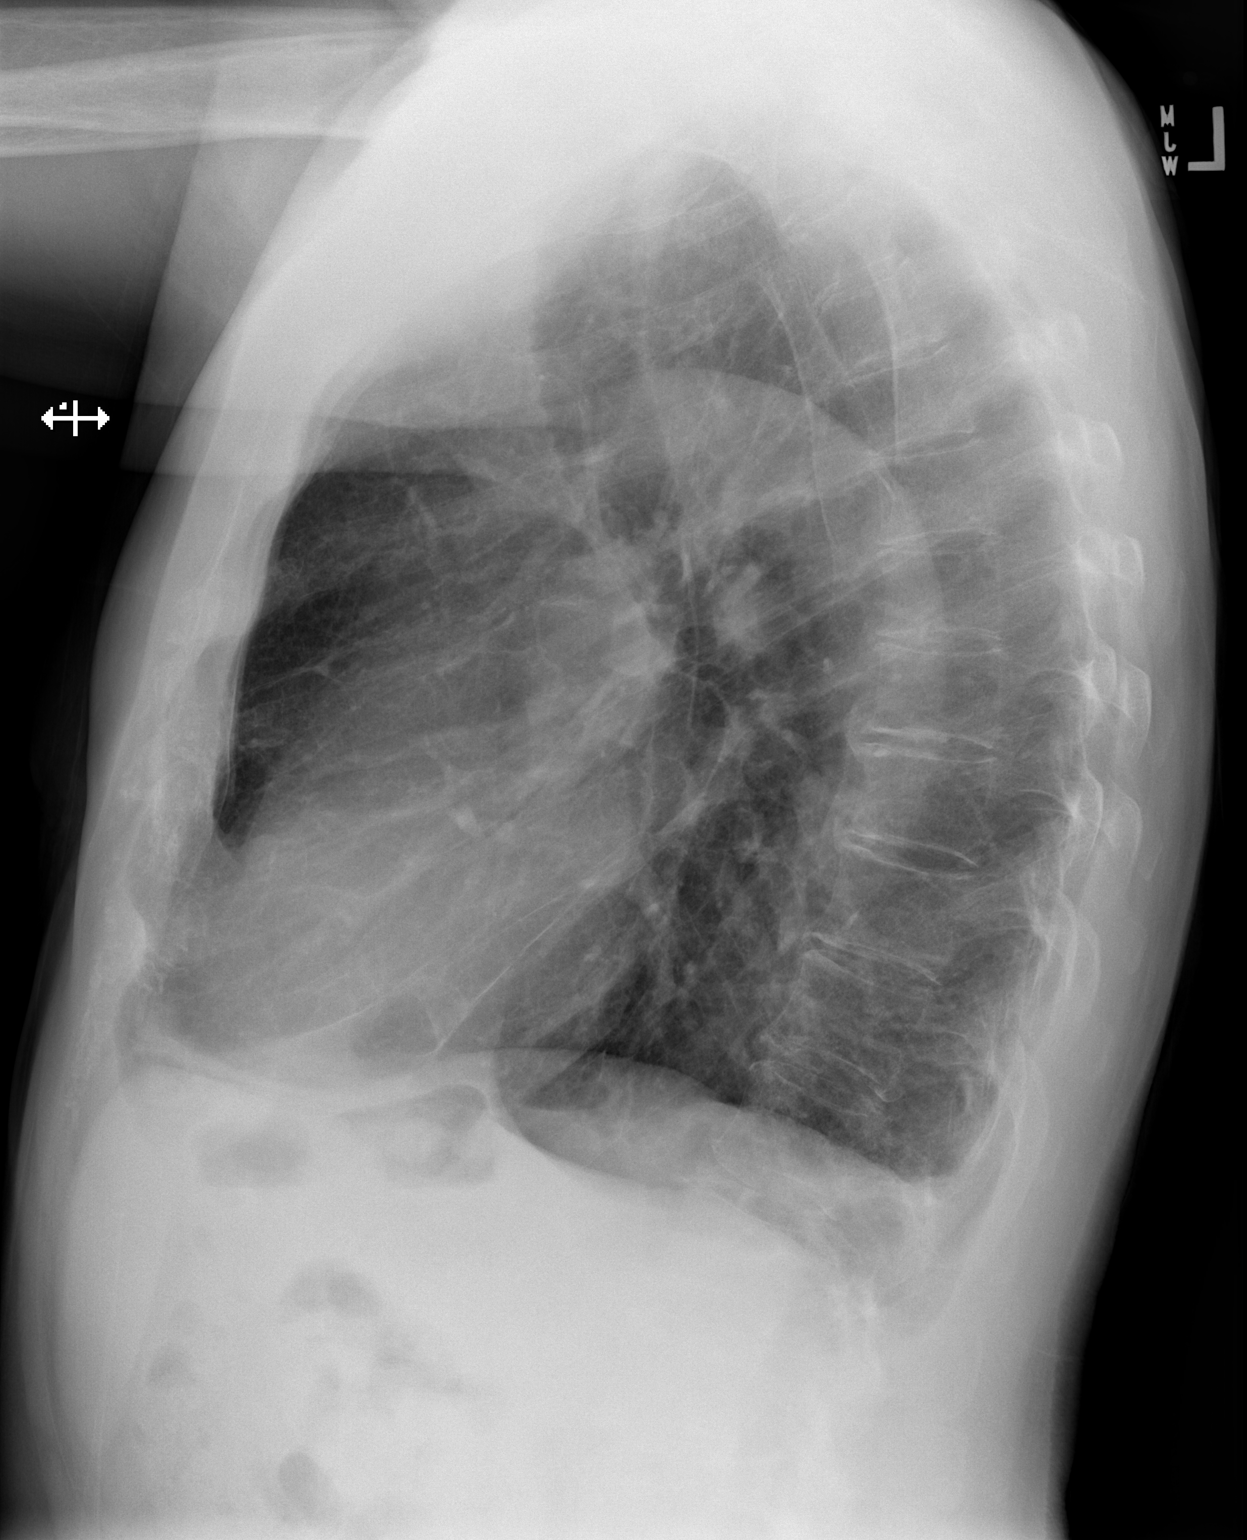

[2 of 2 positions shown; findings below may reference images not displayed]

FINDINGS: Mild enlargement of cardiac silhouette.

Mediastinal contours and pulmonary vascularity normal.

Emphysematous changes without infiltrate, pleural effusion or
pneumothorax.

Multiple old RIGHT rib fractures.

Bones demineralized.
IMPRESSION: Mild enlargement of cardiac silhouette.

COPD changes without acute infiltrate.

## 2017-01-06 IMAGING — CR DG CHEST 1V PORT
1 series · 1 of 1 positions shown · non-contrast
Comparison: Two-view chest x-ray 01/02/2016.

CLINICAL DATA: Mitral valve replacement.

EXAM:
PORTABLE CHEST - 1 VIEW

[AP]
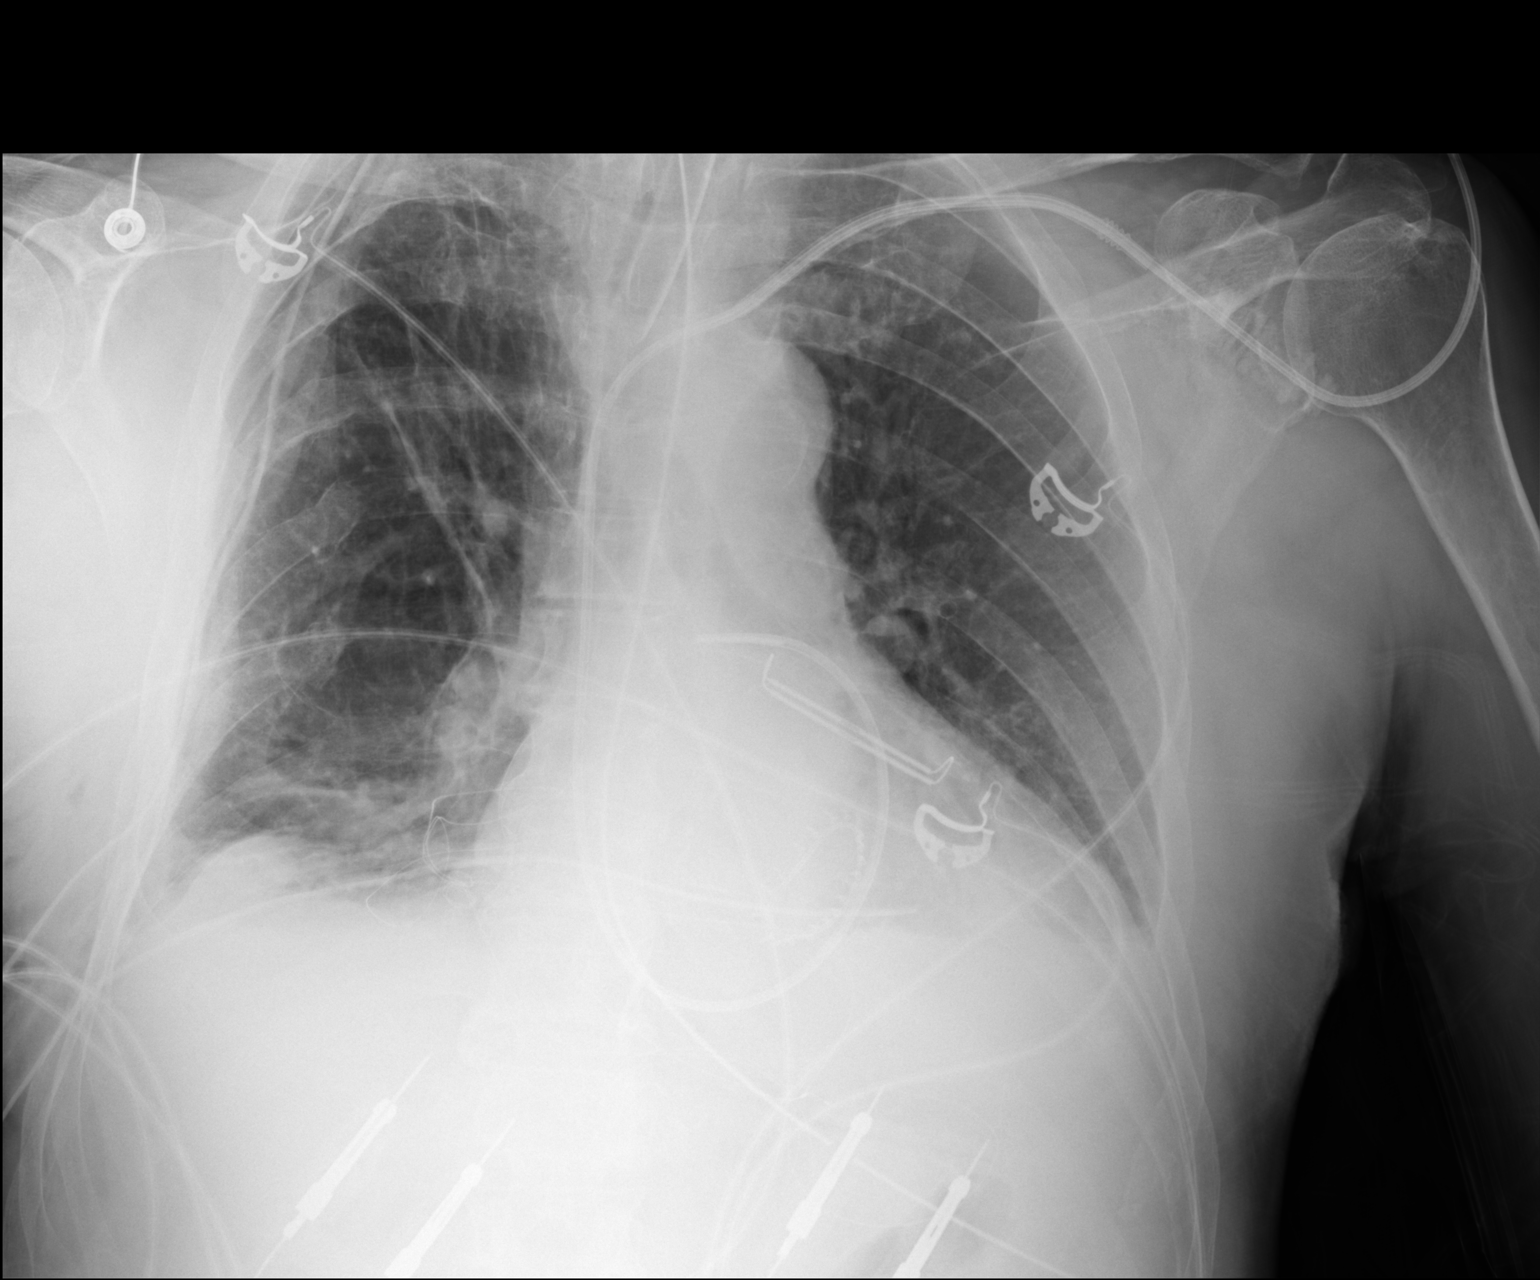

[1 of 1 positions shown; findings below may reference images not displayed]

FINDINGS: The patient is intubated. The endotracheal tube terminates 5.5 cm
above the carina. A right-sided chest tube is in place. There is no
significant pneumothorax. Small bilateral pleural effusions are
noted. Lateral valve annular repair and clipping of the atrial
appendage are noted. External pacing wires are in place. A
mediastinal drain is in place. A Swan-Ganz catheter enters via a
left subclavian line. It terminates in the proximal right main
pulmonary artery.
IMPRESSION: 1. Status post mitral annular repair and atrial appendage clipping.
2. Support apparatus as described.
3. Right-sided chest tube without pneumothorax.
4. Bilateral pleural effusions.

## 2017-01-07 IMAGING — CR DG CHEST 1V PORT
1 series · 1 of 1 positions shown · non-contrast
Comparison: January 04, 2016.

CLINICAL DATA: Atelectasis.

EXAM:
PORTABLE CHEST 1 VIEW

[AP]
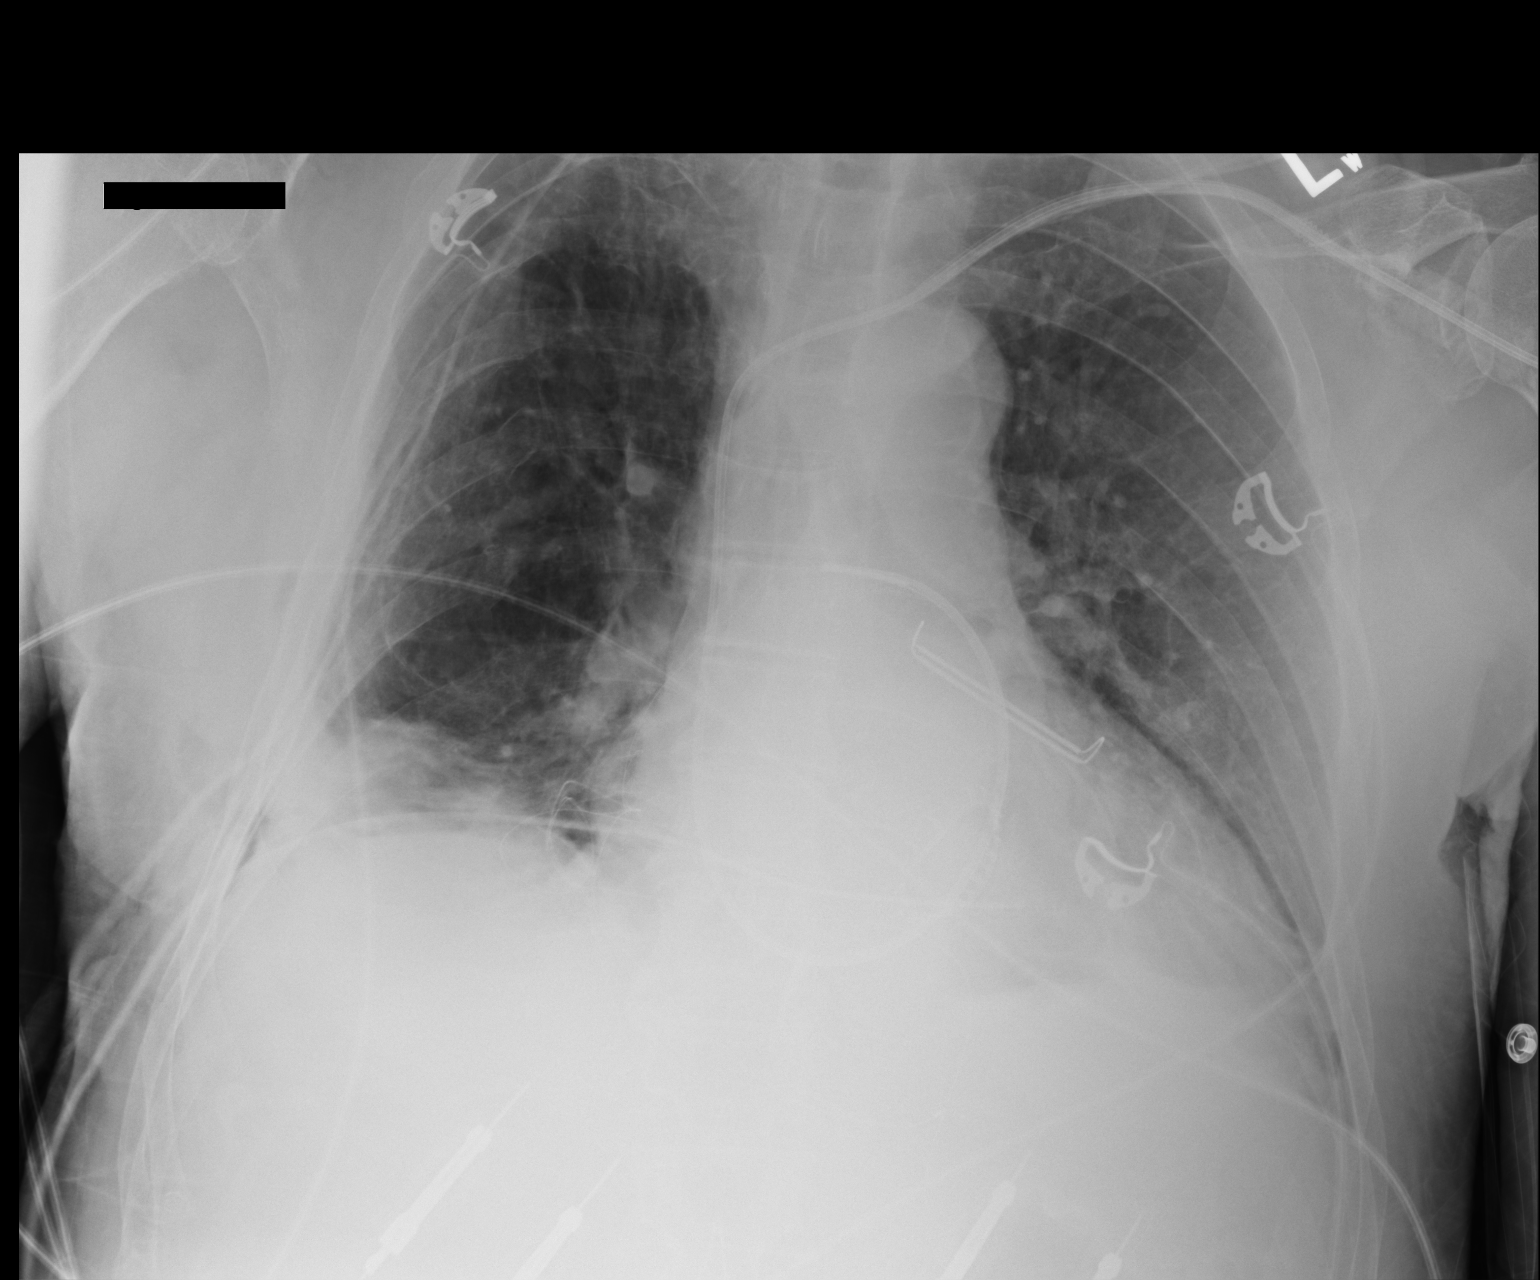

[1 of 1 positions shown; findings below may reference images not displayed]

FINDINGS: Stable cardiomegaly. Stable position of right-sided chest tubes
without definite pneumothorax. Stable right basilar opacity is noted
concerning for atelectasis or infiltrate. Endotracheal and
nasogastric tubes have been removed. Stable position of left
subclavian Swan-Ganz catheter with tip directed toward right
pulmonary artery. Stable mild left basilar opacity is noted
concerning for atelectasis or infiltrate. Bony thorax is
unremarkable.
IMPRESSION: Stable position of right-sided chest tubes without definite
pneumothorax. Stable bibasilar opacities are noted. Endotracheal and
nasogastric tubes have been removed.

## 2017-02-19 DIAGNOSIS — Z8546 Personal history of malignant neoplasm of prostate: Secondary | ICD-10-CM | POA: Diagnosis not present

## 2017-02-19 DIAGNOSIS — E785 Hyperlipidemia, unspecified: Secondary | ICD-10-CM | POA: Diagnosis not present

## 2017-02-19 DIAGNOSIS — I349 Nonrheumatic mitral valve disorder, unspecified: Secondary | ICD-10-CM | POA: Diagnosis not present

## 2017-02-19 DIAGNOSIS — Z954 Presence of other heart-valve replacement: Secondary | ICD-10-CM | POA: Diagnosis not present

## 2017-02-19 DIAGNOSIS — K219 Gastro-esophageal reflux disease without esophagitis: Secondary | ICD-10-CM | POA: Diagnosis not present

## 2017-02-19 DIAGNOSIS — I480 Paroxysmal atrial fibrillation: Secondary | ICD-10-CM | POA: Diagnosis not present

## 2017-02-19 DIAGNOSIS — Z7901 Long term (current) use of anticoagulants: Secondary | ICD-10-CM | POA: Diagnosis not present

## 2017-02-19 DIAGNOSIS — I6529 Occlusion and stenosis of unspecified carotid artery: Secondary | ICD-10-CM | POA: Diagnosis not present

## 2017-02-19 DIAGNOSIS — I340 Nonrheumatic mitral (valve) insufficiency: Secondary | ICD-10-CM | POA: Diagnosis not present

## 2017-03-21 ENCOUNTER — Other Ambulatory Visit: Payer: Self-pay | Admitting: *Deleted

## 2017-03-21 MED ORDER — PREDNISONE 10 MG (21) PO TBPK: ORAL_TABLET | ORAL | 0 refills | Status: DC

## 2017-04-03 DIAGNOSIS — L0101 Non-bullous impetigo: Secondary | ICD-10-CM | POA: Diagnosis not present

## 2017-04-03 DIAGNOSIS — L270 Generalized skin eruption due to drugs and medicaments taken internally: Secondary | ICD-10-CM | POA: Diagnosis not present

## 2017-04-03 DIAGNOSIS — Z85828 Personal history of other malignant neoplasm of skin: Secondary | ICD-10-CM | POA: Diagnosis not present

## 2017-04-17 ENCOUNTER — Encounter: Payer: Self-pay | Admitting: Internal Medicine

## 2017-04-17 ENCOUNTER — Non-Acute Institutional Stay: Payer: Medicare Other | Admitting: Internal Medicine

## 2017-04-17 VITALS — BP 150/80 | HR 73 | Temp 97.8°F | Wt 163.0 lb

## 2017-04-17 DIAGNOSIS — E039 Hypothyroidism, unspecified: Secondary | ICD-10-CM

## 2017-04-17 DIAGNOSIS — C61 Malignant neoplasm of prostate: Secondary | ICD-10-CM

## 2017-04-17 DIAGNOSIS — I779 Disorder of arteries and arterioles, unspecified: Secondary | ICD-10-CM

## 2017-04-17 DIAGNOSIS — I480 Paroxysmal atrial fibrillation: Secondary | ICD-10-CM | POA: Diagnosis not present

## 2017-04-17 DIAGNOSIS — I739 Peripheral vascular disease, unspecified: Secondary | ICD-10-CM

## 2017-04-17 DIAGNOSIS — Z636 Dependent relative needing care at home: Secondary | ICD-10-CM | POA: Insufficient documentation

## 2017-04-17 DIAGNOSIS — T7840XA Allergy, unspecified, initial encounter: Secondary | ICD-10-CM | POA: Diagnosis not present

## 2017-04-17 MED ORDER — METHYLPREDNISOLONE ACETATE 40 MG/ML IJ SUSP
80.0000 mg | Freq: Once | INTRAMUSCULAR | Status: AC
  Administered 2017-04-17: 80 mg via INTRAMUSCULAR

## 2017-04-17 NOTE — Progress Notes (Signed)
Location:  [facility] clinic Provider:  Nailyn Dearinger L. Mariea Clonts, D.O., C.M.D.  Code Status: DNR Goals of Care:  Advanced Directives 12/12/2016  Does Patient Have a Medical Advance Directive? Yes  Type of Paramedic of [place];Living will  Does patient want to make changes to medical advance directive? -  Copy of [place] in Chart? Yes     Chief Complaint  Patient presents with  . Medical Management of Chronic Issues    4 mth follow-up    HPI: Patient is a 83 y.o. male seen today for medical management of chronic diseases.    Says he had a reaction to the shingrix.  He has been on prednisone taper from dermatology.  Is down to 1 pill left.  It's on the back of his neck, shoulder and up into scalp.  2/12 was his first shingrix, second was 5/16.  5/31 first prednisone, 2nd was 6/16.  Has one 20mg  tablet left.  Is frustrated and wants this gone.  Caregiver stress:  His wife has Alzheimer's disease:  He's repeating things every minute for his wife.   She continues to be able to dress, shower and do for herself. He keeps track of her medication.  She does not wander off.  Still able to get to the bathroom to urinate but urgent in the mornings.   She is sleeping a lot--sleeps late, she eats and reads the paper 9-12.  Then she dresses and takes a couple hour nap.  Is able to still sleep at night.    He is still swimming and exercising.   Saw Dr. Wynonia Lawman on 5/1.  Doing well.  Has echo in Nov and f/u appt again with cardiology.  Past Medical History:  Diagnosis Date  . Aortoiliac occlusive disease ([place])   . Carotid artery disease ([place])   . Complication of anesthesia 1955   sodium pentathol ?  Marland Kitchen Contracture of palmar fascia 06/21/2010  . Cortical senile cataract 06/27/2010  . Dysrhythmia   . Enthesopathy of hip region 11/27/2009  . Essential hypertension, benign 06/21/2010  . GERD (gastroesophageal reflux disease)    occ  . Hyperlipidemia LDL goal <100  06/21/2010  . Hypertrophy of prostate without urinary obstruction and other lower urinary tract symptoms (LUTS) 06/21/2010  . Hypothyroidism 08/27/2011  . Malignant neoplasm of prostate ([place]) 06/21/2010  . Mitral regurgitation 10/07/2015   Severe MR noted on ECHO with partially flail post leaflet Oct 03 2015   . Nocturia 2011  . Osteoarthrosis, unspecified whether generalized or localized, unspecified site 06/21/2010  . Other and unspecified hyperlipidemia 06/21/2010  . Pain in joint, lower leg 02/25/2012  . Palpitations 08/14/2010  . Patent foramen ovale 01/04/2016   Closed at the time of mitral valve repair   . S/P minimally invasive mitral valve repair 01/04/2016   Complex valvuloplasty including triangular resection of flail segment of posterior leaflet, artificial Gore-tex neochord placement x6 and 26mm Sorin Memo 3D ring annuloplasty via right mini thoracotomy approach with closure of PFO and clipping of LA appendage   . Unspecified constipation 06/21/2010  . Unspecified essential hypertension 06/21/2010  . Unspecified hypothyroidism 08/27/2011  . Urinary frequency 08/14/2008    Past Surgical History:  Procedure Laterality Date  . CARDIAC CATHETERIZATION N/A 11/10/2015   Procedure: Right/Left Heart Cath and Coronary Angiography;  Surgeon: Peter M Martinique, MD;  Location: [facility];  Service: Cardiovascular;  Laterality: N/A;  . CATARACT EXTRACTION W/ INTRAOCULAR LENS  IMPLANT, BILATERAL  2006  . CLIPPING OF ATRIAL APPENDAGE N/A 01/04/2016   Procedure: CLIPPING OF ATRIAL APPENDAGE;  Surgeon: Rexene Alberts, MD;  Location: [facility];  Service: Open Heart Surgery;  Laterality: N/A;  . KNEE ARTHROSCOPY WITH MENISCAL REPAIR Left 1968  . MITRAL VALVE REPAIR Right 01/04/2016   Procedure: MINIMALLY INVASIVE MITRAL VALVE REPAIR (MVR);  Surgeon: Rexene Alberts, MD;  Location: [facility];  Service: Open Heart Surgery;  Laterality: Right;  . PROSTATE SURGERY  2000   seed implants  . TEE WITHOUT  CARDIOVERSION N/A 11/10/2015   Procedure: TRANSESOPHAGEAL ECHOCARDIOGRAM (TEE);  Surgeon: Skeet Latch, MD;  Location: [facility];  Service: Cardiovascular;  Laterality: N/A;  . TEE WITHOUT CARDIOVERSION N/A 01/04/2016   Procedure: TRANSESOPHAGEAL ECHOCARDIOGRAM (TEE);  Surgeon: Rexene Alberts, MD;  Location: [facility];  Service: Open Heart Surgery;  Laterality: N/A;  . TOTAL KNEE ARTHROPLASTY Right 1955   Dr. Ethel Rana    No Known Allergies  Allergies as of 04/17/2017   No Known Allergies     Medication List       Accurate as of 04/17/17  1:37 PM. Always use your most recent med list.          alfuzosin 10 MG 24 hr tablet Commonly known as:  UROXATRAL Take 10 mg by mouth daily with breakfast. Take one daily to reduce bladder spasm   aspirin 81 MG tablet Take 81 mg by mouth daily. Take one daily for anticoagulation   Levothyroxine Sodium 50 MCG Caps Take 1 capsule (50 mcg total) by mouth daily before breakfast.   losartan 50 MG tablet Commonly known as:  COZAAR Take 50 mg by mouth daily.   metoprolol succinate 25 MG 24 hr tablet Commonly known as:  TOPROL-XL Take 12.5 mg by mouth daily.   nabumetone 750 MG tablet Commonly known as:  RELAFEN TAKE 1 TABLET TWICE A DAY FOR SWELLING   simvastatin 20 MG tablet Commonly known as:  ZOCOR TAKE 1 TABLET DAILY TO MAINTAIN LOW CHOLESTEROL   VESICARE 5 MG tablet Generic drug:  solifenacin Take 5 mg by mouth daily.       Review of Systems:  Review of Systems  Constitutional: Negative for chills, fever and malaise/fatigue.  HENT: Negative for hearing loss.   Eyes: Negative for blurred vision.  Respiratory: Negative for cough and shortness of breath.   Cardiovascular: Positive for leg swelling. Negative for chest pain and palpitations.       Left greater than right  Gastrointestinal: Negative for abdominal pain, blood in stool, constipation, diarrhea and melena.  Genitourinary: Negative for dysuria.  Musculoskeletal:  Negative for falls and myalgias.  Skin: Positive for itching and rash.  Neurological: Negative for dizziness, loss of consciousness and weakness.  Psychiatric/Behavioral: Negative for depression and memory loss. The patient is not nervous/anxious and does not have insomnia.     Health Maintenance  Topic Date Due  . INFLUENZA VACCINE  05/22/2017  . TETANUS/TDAP  02/27/2025  . PNA vac Low Risk Adult  Completed    Physical Exam: Vitals:   04/17/17 1328  BP: (!) 150/80  Pulse: 73  Temp: 97.8 F (36.6 C)  TempSrc: Oral  SpO2: 95%  Weight: 163 lb (73.9 kg)   Body mass index is 24.78 kg/m. Physical Exam  Constitutional: He is oriented to person, place, and time. He appears well-developed and well-nourished. No distress.  Cardiovascular: Normal rate, regular rhythm and intact distal pulses.   Murmur heard. Pulmonary/Chest: Effort normal and breath sounds  normal. No respiratory distress.  Abdominal: Soft. Bowel sounds are normal. He exhibits no distension. There is no tenderness.  Musculoskeletal: Normal range of motion.  Neurological: He is alert and oriented to person, place, and time.  Skin: Skin is warm and dry. Rash noted.  Erythematous papules on neck upper shoulders and left upper arm  Psychiatric: He has a normal mood and affect.    Labs reviewed: Basic Metabolic Panel:  Recent Labs  05/01/16  NA 142  K 4.3  BUN 18  CREATININE 1.1  TSH 3.81   Liver Function Tests: No results for input(s): AST, ALT, ALKPHOS, BILITOT, PROT, ALBUMIN in the last 8760 hours. No results for input(s): LIPASE, AMYLASE in the last 8760 hours. No results for input(s): AMMONIA in the last 8760 hours. CBC:  Recent Labs  05/01/16  WBC 4.5  HGB 11.5*  HCT 37*  PLT 212   Lipid Panel: No results for input(s): CHOL, HDL, LDLCALC, TRIG, CHOLHDL, LDLDIRECT in the last 8760 hours. Lab Results  Component Value Date   HGBA1C 5.5 01/02/2016    Assessment/Plan 1. Allergic reaction to  drug, initial encounter - down to 20mg  prednisone and itching getting worse again -wants this taken care of so will give him depomedrol 80mg  once and see how that goes today and how long it lasts him--hoping this rash goes away with the injection - methylPREDNISolone acetate (DEPO-MEDROL) injection 80 mg; Inject 2 mLs (80 mg total) into the muscle once.  2. Bilateral carotid artery disease ([place]) -follows with cardiology for this  3. Paroxysmal atrial fibrillation ([facility]) -no recent episodes, stable with current regimen,monitor  4. Hypothyroidism, unspecified type -cont current levothyroxine, monitor  5. Malignant neoplasm of prostate ([place]) -no symptomatic complaints right now, no changes needed  6. Caregiver stress -has elevated his bp and he is haivng to repeat himself minute by minute, fortunately, his wife's functional status persists  Labs/tests ordered:  No orders of the defined types were placed in this encounter.  Next appt:  08/21/2017   Marielena Harvell L. Anju Sereno, D.O. [facility] [address],  [address] Cell Phone (Mon-Fri 8am-5pm):  [phone] On Call:  [phone] & follow prompts after 5pm & weekends Office Phone:  [phone] Office Fax:  [phone]

## 2017-05-09 DIAGNOSIS — L270 Generalized skin eruption due to drugs and medicaments taken internally: Secondary | ICD-10-CM | POA: Diagnosis not present

## 2017-05-09 DIAGNOSIS — Z85828 Personal history of other malignant neoplasm of skin: Secondary | ICD-10-CM | POA: Diagnosis not present

## 2017-07-22 DIAGNOSIS — L308 Other specified dermatitis: Secondary | ICD-10-CM | POA: Diagnosis not present

## 2017-07-22 DIAGNOSIS — L986 Other infiltrative disorders of the skin and subcutaneous tissue: Secondary | ICD-10-CM | POA: Diagnosis not present

## 2017-07-29 DIAGNOSIS — H5213 Myopia, bilateral: Secondary | ICD-10-CM | POA: Diagnosis not present

## 2017-07-29 DIAGNOSIS — Z961 Presence of intraocular lens: Secondary | ICD-10-CM | POA: Diagnosis not present

## 2017-07-29 DIAGNOSIS — H26491 Other secondary cataract, right eye: Secondary | ICD-10-CM | POA: Diagnosis not present

## 2017-08-13 ENCOUNTER — Encounter: Payer: Self-pay | Admitting: Internal Medicine

## 2017-08-13 ENCOUNTER — Other Ambulatory Visit: Payer: Self-pay | Admitting: Internal Medicine

## 2017-08-13 DIAGNOSIS — D649 Anemia, unspecified: Secondary | ICD-10-CM | POA: Diagnosis not present

## 2017-08-13 DIAGNOSIS — I779 Disorder of arteries and arterioles, unspecified: Secondary | ICD-10-CM | POA: Diagnosis not present

## 2017-08-13 DIAGNOSIS — E785 Hyperlipidemia, unspecified: Secondary | ICD-10-CM | POA: Diagnosis not present

## 2017-08-13 DIAGNOSIS — E039 Hypothyroidism, unspecified: Secondary | ICD-10-CM | POA: Diagnosis not present

## 2017-08-13 DIAGNOSIS — I10 Essential (primary) hypertension: Secondary | ICD-10-CM | POA: Diagnosis not present

## 2017-08-13 LAB — BASIC METABOLIC PANEL
BUN: 17 (ref 4–21)
Creatinine: 1.2 (ref 0.6–1.3)
Glucose: 96
Potassium: 4.6 (ref 3.4–5.3)
Sodium: 135 — AB (ref 137–147)

## 2017-08-13 LAB — HEPATIC FUNCTION PANEL
ALT: 20 (ref 10–40)
AST: 26 (ref 14–40)
Alkaline Phosphatase: 82 (ref 25–125)
Bilirubin, Total: 0.4

## 2017-08-13 LAB — CBC AND DIFFERENTIAL
HCT: 42 (ref 41–53)
Hemoglobin: 13.8 (ref 13.5–17.5)
Platelets: 234 (ref 150–399)
WBC: 5

## 2017-08-13 LAB — LIPID PANEL
Cholesterol: 160 (ref 0–200)
HDL: 71 — AB (ref 35–70)
LDL Cholesterol: 70
Triglycerides: 95 (ref 40–160)

## 2017-08-13 LAB — TSH: TSH: 4.22 (ref 0.41–5.90)

## 2017-08-21 ENCOUNTER — Encounter: Payer: Self-pay | Admitting: Internal Medicine

## 2017-08-21 ENCOUNTER — Non-Acute Institutional Stay: Payer: Medicare Other | Admitting: Internal Medicine

## 2017-08-21 VITALS — BP 122/70 | HR 68 | Temp 97.7°F | Wt 162.0 lb

## 2017-08-21 DIAGNOSIS — I10 Essential (primary) hypertension: Secondary | ICD-10-CM | POA: Diagnosis not present

## 2017-08-21 DIAGNOSIS — C61 Malignant neoplasm of prostate: Secondary | ICD-10-CM | POA: Diagnosis not present

## 2017-08-21 DIAGNOSIS — T50Z95S Adverse effect of other vaccines and biological substances, sequela: Secondary | ICD-10-CM | POA: Diagnosis not present

## 2017-08-21 DIAGNOSIS — B029 Zoster without complications: Secondary | ICD-10-CM | POA: Diagnosis not present

## 2017-08-21 DIAGNOSIS — R21 Rash and other nonspecific skin eruption: Secondary | ICD-10-CM | POA: Diagnosis not present

## 2017-08-21 DIAGNOSIS — I480 Paroxysmal atrial fibrillation: Secondary | ICD-10-CM | POA: Diagnosis not present

## 2017-08-21 DIAGNOSIS — Z636 Dependent relative needing care at home: Secondary | ICD-10-CM

## 2017-08-21 DIAGNOSIS — E039 Hypothyroidism, unspecified: Secondary | ICD-10-CM

## 2017-08-21 MED ORDER — TRIAMCINOLONE 0.1 % CREAM:EUCERIN CREAM 1:1: 1.0000 "application " | TOPICAL_CREAM | Freq: Two times a day (BID) | CUTANEOUS | 3 refills | Status: DC

## 2017-08-21 MED ORDER — VALACYCLOVIR HCL 1 G PO TABS: 1000.0000 mg | ORAL_TABLET | Freq: Three times a day (TID) | ORAL | 0 refills | Status: DC

## 2017-08-21 MED ORDER — NABUMETONE 750 MG PO TABS: ORAL_TABLET | ORAL | 3 refills | Status: DC

## 2017-08-21 NOTE — Progress Notes (Signed)
Location:  Occupational psychologist of Service:  Clinic (12)  Provider: Dara Camargo L. Mariea Clonts, D.O., C.M.D.  Code Status: DNR Goals of Care:  Advanced Directives 12/12/2016  Does Patient Have a Medical Advance Directive? Yes  Type of Paramedic of [place];Living will  Does patient want to make changes to medical advance directive? -  Copy of [place] in Chart? Yes   Chief Complaint  Patient presents with  . Medical Management of Chronic Issues    36mth follow-up, look at rash    HPI: Patient is a 83 y.o. male seen today for medical management of chronic diseases/ 4 month f/u. All of his rash and itching is about the same.  Wants me to f/u on [facility] email he sent about shingrix. Still has same rash.  He feels like his dermatologist has exhausted the possibilities and pt is requesting a referral to an academic institution for further workup. Bothersome on whole back and scalp.  Worst on left side of head and top of back.  He has also sent his case report to [employer] by email on 7/14 and he reports that his dermatologist had yet to hear from the company to request further information about his case.      Doing well from cardiac perspective.  No chest pain, sob, palpitations.  Sees Dr. Wynonia Lawman next week for echo.  Still swimming in the pool here with salt water.  It seems to settle the rash down.  Is in the pool for 20 mins.    Reviewed labs with patient. All good.  Got flu shot at [facility].  No changes in urinary symptoms on vesicare.    BP at goal.  On cozaar, toprol-xl.    Hyperlipidemia:  At goal with zocor.  Notes his wife's dementia is a little worse.  They quit doing the meal deliveries b/c it takes her too long to get her ready.  He says her short-term memory is gone completely.    He's still her caregiver, no hired help.  She's very docile.  She gets up at 9am, he fixes breakfast, she sits and eats and reads the paper until  1215, then walks into the den in her bathrobe and pjs and naps for an 1.5 hrs in her recliner.  Only has to help her fasten her bra.  He picks out her clothes.  She has fallen 1-2 times in the last month for no known reason.  No injuries.  We discussed the idea of a move to health care which he wants to be a last resort.  I also suggested home care coming in to keep her moving and active in the afternoons.  He says he would get someone in if she was unable to do her adls on her own.    Past Medical History:  Diagnosis Date  . Aortoiliac occlusive disease ([place])   . Carotid artery disease ([place])   . Complication of anesthesia 1955   sodium pentathol ?  Marland Kitchen Contracture of palmar fascia 06/21/2010  . Cortical senile cataract 06/27/2010  . Dysrhythmia   . Enthesopathy of hip region 11/27/2009  . Essential hypertension, benign 06/21/2010  . GERD (gastroesophageal reflux disease)    occ  . Hyperlipidemia LDL goal <100 06/21/2010  . Hypertrophy of prostate without urinary obstruction and other lower urinary tract symptoms (LUTS) 06/21/2010  . Hypothyroidism 08/27/2011  . Malignant neoplasm of prostate ([facility]) 06/21/2010  . Mitral regurgitation 10/07/2015  Severe MR noted on ECHO with partially flail post leaflet Oct 03 2015   . Nocturia 2011  . Osteoarthrosis, unspecified whether generalized or localized, unspecified site 06/21/2010  . Other and unspecified hyperlipidemia 06/21/2010  . Pain in joint, lower leg 02/25/2012  . Palpitations 08/14/2010  . Patent foramen ovale 01/04/2016   Closed at the time of mitral valve repair   . S/P minimally invasive mitral valve repair 01/04/2016   Complex valvuloplasty including triangular resection of flail segment of posterior leaflet, artificial Gore-tex neochord placement x6 and 49mm Sorin Memo 3D ring annuloplasty via right mini thoracotomy approach with closure of PFO and clipping of LA appendage   . Unspecified constipation 06/21/2010  . Unspecified essential  hypertension 06/21/2010  . Unspecified hypothyroidism 08/27/2011  . Urinary frequency 08/14/2008    Past Surgical History:  Procedure Laterality Date  . CARDIAC CATHETERIZATION N/A 11/10/2015   Procedure: Right/Left Heart Cath and Coronary Angiography;  Surgeon: Peter M Martinique, MD;  Location: [facility];  Service: Cardiovascular;  Laterality: N/A;  . CATARACT EXTRACTION W/ INTRAOCULAR LENS  IMPLANT, BILATERAL  2006  . CLIPPING OF ATRIAL APPENDAGE N/A 01/04/2016   Procedure: CLIPPING OF ATRIAL APPENDAGE;  Surgeon: Rexene Alberts, MD;  Location: [facility];  Service: Open Heart Surgery;  Laterality: N/A;  . KNEE ARTHROSCOPY WITH MENISCAL REPAIR Left 1968  . MITRAL VALVE REPAIR Right 01/04/2016   Procedure: MINIMALLY INVASIVE MITRAL VALVE REPAIR (MVR);  Surgeon: Rexene Alberts, MD;  Location: [facility];  Service: Open Heart Surgery;  Laterality: Right;  . PROSTATE SURGERY  2000   seed implants  . TEE WITHOUT CARDIOVERSION N/A 11/10/2015   Procedure: TRANSESOPHAGEAL ECHOCARDIOGRAM (TEE);  Surgeon: Skeet Latch, MD;  Location: [facility];  Service: Cardiovascular;  Laterality: N/A;  . TEE WITHOUT CARDIOVERSION N/A 01/04/2016   Procedure: TRANSESOPHAGEAL ECHOCARDIOGRAM (TEE);  Surgeon: Rexene Alberts, MD;  Location: [facility];  Service: Open Heart Surgery;  Laterality: N/A;  . TOTAL KNEE ARTHROPLASTY Right 1955   Dr. Ethel Rana    No Known Allergies  Outpatient Encounter Prescriptions as of 08/21/2017  Medication Sig  . alfuzosin (UROXATRAL) 10 MG 24 hr tablet Take 10 mg by mouth daily with breakfast. Take one daily to reduce bladder spasm  . aspirin 81 MG tablet Take 81 mg by mouth daily. Take one daily for anticoagulation  . Levothyroxine Sodium 50 MCG CAPS Take 1 capsule (50 mcg total) by mouth daily before breakfast.  . losartan (COZAAR) 50 MG tablet Take 50 mg by mouth daily.   . metoprolol succinate (TOPROL-XL) 25 MG 24 hr tablet Take 12.5 mg by mouth daily.  . nabumetone (RELAFEN) 750 MG  tablet TAKE 1 TABLET TWICE A DAY FOR SWELLING  . simvastatin (ZOCOR) 20 MG tablet TAKE 1 TABLET DAILY TO MAINTAIN LOW CHOLESTEROL  . VESICARE 5 MG tablet Take 5 mg by mouth daily.   . [DISCONTINUED] nabumetone (RELAFEN) 750 MG tablet TAKE 1 TABLET TWICE A DAY FOR SWELLING   No facility-administered encounter medications on file as of 08/21/2017.     Review of Systems:  Review of Systems  Constitutional: Negative for chills, fever and malaise/fatigue.  HENT: Negative for congestion.   Respiratory: Negative for cough and shortness of breath.   Cardiovascular: Negative for chest pain, palpitations and leg swelling.  Gastrointestinal: Negative for abdominal pain and constipation.  Genitourinary: Negative for dysuria, frequency and urgency.       Doing well on vesicare  Musculoskeletal: Negative for falls and  joint pain.  Skin: Positive for itching and rash.  Neurological: Negative for dizziness and loss of consciousness.  Endo/Heme/Allergies: Bruises/bleeds easily.  Psychiatric/Behavioral: Negative for depression and memory loss. The patient is not nervous/anxious.     Health Maintenance  Topic Date Due  . INFLUENZA VACCINE  05/22/2017  . TETANUS/TDAP  02/27/2025  . PNA vac Low Risk Adult  Completed    Physical Exam: Vitals:   08/21/17 1301  BP: 122/70  Pulse: 68  Temp: 97.7 F (36.5 C)  TempSrc: Oral  SpO2: 97%  Weight: 162 lb (73.5 kg)   Body mass index is 24.63 kg/m. Physical Exam  Constitutional: He is oriented to person, place, and time. He appears well-developed and well-nourished. No distress.  HENT:  Head: Normocephalic and atraumatic.  Cardiovascular: Normal rate, regular rhythm and intact distal pulses.  Murmur heard. Pulmonary/Chest: Effort normal and breath sounds normal. No respiratory distress.  Abdominal: Soft. Bowel sounds are normal. He exhibits no distension. There is no tenderness.  Musculoskeletal: Normal range of motion.  Neurological: He is alert  and oriented to person, place, and time.  Skin: Skin is warm and dry. Capillary refill takes less than 2 seconds.  Several patches of dried up vesicles on his back--upper back near neck, left midback and right lower back   Psychiatric: He has a normal mood and affect. His behavior is normal. Judgment and thought content normal.    Labs reviewed: Basic Metabolic Panel:  Recent Labs  08/13/17 0600  NA 135*  K 4.6  BUN 17  CREATININE 1.2  TSH 4.22   Liver Function Tests:  Recent Labs  08/13/17 0600  AST 26  ALT 20  ALKPHOS 82   No results for input(s): LIPASE, AMYLASE in the last 8760 hours. No results for input(s): AMMONIA in the last 8760 hours. CBC:  Recent Labs  08/13/17 0600  WBC 5.0  HGB 13.8  HCT 42  PLT 234   Lipid Panel:  Recent Labs  08/13/17 0600  CHOL 160  HDL 71*  LDLCALC 70  TRIG 95   Lab Results  Component Value Date   HGBA1C 5.5 01/02/2016    Assessment/Plan 1. Vaccine reaction, sequela - Ambulatory referral to Dermatology at [facility] to address ongoing recurrent episodes of zoster since shingrix vaccinations - will attempt tx with valtrex to see if this can stop the cycle--?need for long term medication with this since steroid txs have been ineffective - valACYclovir (VALTREX) 1000 MG tablet; Take 1 tablet (1,000 mg total) by mouth every 8 (eight) hours.  Dispense: 21 tablet; Refill: 0 - Triamcinolone Acetonide (TRIAMCINOLONE 0.1 % CREAM : EUCERIN) CREA; Apply 1 application topically 2 (two) times daily.  Dispense: 1 each; Refill: 3  2. Rash of back - Ambulatory referral to Dermatology - valACYclovir (VALTREX) 1000 MG tablet; Take 1 tablet (1,000 mg total) by mouth every 8 (eight) hours.  Dispense: 21 tablet; Refill: 0 - Triamcinolone Acetonide (TRIAMCINOLONE 0.1 % CREAM : EUCERIN) CREA; Apply 1 application topically 2 (two) times daily.  Dispense: 1 each; Refill: 3  3. Herpes zoster without complication - valACYclovir (VALTREX) 1000  MG tablet; Take 1 tablet (1,000 mg total) by mouth every 8 (eight) hours.  Dispense: 21 tablet; Refill: 0 - Triamcinolone Acetonide (TRIAMCINOLONE 0.1 % CREAM : EUCERIN) CREA; Apply 1 application topically 2 (two) times daily.  Dispense: 1 each; Refill: 3  4. Caregiver stress -ongoing with wife's Alzheimer's--says she needs more stimulation, but does not want to hire a  caregiver or consider a move for her at this point  5. Paroxysmal atrial fibrillation (HCC) -cont toprol-xl for rate control, baby asa anticoagulation, had episode many years ago  6. Malignant neoplasm of prostate ([place]) -monitored by urology, continues on vesicare  7. Essential hypertension, benign -bp well controlled with current regimen, cont same and monitor  8. Hypothyroidism, unspecified type -cont current levothyroxine and monitor   Labs/tests ordered:   Orders Placed This Encounter  Procedures  . Ambulatory referral to Dermatology    Referral Priority:   Routine    Referral Type:   Consultation    Referral Reason:   Specialty Services Required    Requested Specialty:   Dermatology    Number of Visits Requested:   1   Next appt:  12/25/2017 med [facility]. Aracelia Brinson, D.O. [facility] [address], [place] [address] Cell Phone (Mon-Fri 8am-5pm):  [phone] On Call:  [phone] & follow prompts after 5pm & weekends Office Phone:  [phone] Office Fax:  [phone]

## 2017-08-26 DIAGNOSIS — E7849 Other hyperlipidemia: Secondary | ICD-10-CM | POA: Diagnosis not present

## 2017-08-26 DIAGNOSIS — K219 Gastro-esophageal reflux disease without esophagitis: Secondary | ICD-10-CM | POA: Diagnosis not present

## 2017-08-26 DIAGNOSIS — Z954 Presence of other heart-valve replacement: Secondary | ICD-10-CM | POA: Diagnosis not present

## 2017-08-26 DIAGNOSIS — Z7901 Long term (current) use of anticoagulants: Secondary | ICD-10-CM | POA: Diagnosis not present

## 2017-08-26 DIAGNOSIS — I480 Paroxysmal atrial fibrillation: Secondary | ICD-10-CM | POA: Diagnosis not present

## 2017-08-26 DIAGNOSIS — I340 Nonrheumatic mitral (valve) insufficiency: Secondary | ICD-10-CM | POA: Diagnosis not present

## 2017-08-26 DIAGNOSIS — I349 Nonrheumatic mitral valve disorder, unspecified: Secondary | ICD-10-CM | POA: Diagnosis not present

## 2017-08-26 DIAGNOSIS — I6529 Occlusion and stenosis of unspecified carotid artery: Secondary | ICD-10-CM | POA: Diagnosis not present

## 2017-08-26 DIAGNOSIS — Z8546 Personal history of malignant neoplasm of prostate: Secondary | ICD-10-CM | POA: Diagnosis not present

## 2017-09-04 DIAGNOSIS — L308 Other specified dermatitis: Secondary | ICD-10-CM | POA: Diagnosis not present

## 2017-09-04 DIAGNOSIS — Z85828 Personal history of other malignant neoplasm of skin: Secondary | ICD-10-CM | POA: Diagnosis not present

## 2017-09-11 ENCOUNTER — Telehealth: Payer: Self-pay | Admitting: *Deleted

## 2017-09-11 NOTE — Telephone Encounter (Signed)
It looks like this is usually used in lupus and for rejection of organ transplants, but Dr. Elvera Lennox must know of other dermatologic uses.  I guess the antiviral we gave him didn't help either?

## 2017-09-11 NOTE — Telephone Encounter (Signed)
Pt came by healthcare @ [facility] to advise that is was prescribed MYCOPHENOLATE 500 1 tablet by mouth 2 times daily by Dr. Harriett Sine, pt been taking for a week and he thinks it not working, no good results per pt. He just wanted you to know what he's taking and your thoughts

## 2017-09-16 NOTE — Telephone Encounter (Signed)
Spoke with patient and advised results   

## 2017-09-20 DIAGNOSIS — Z79899 Other long term (current) drug therapy: Secondary | ICD-10-CM | POA: Diagnosis not present

## 2017-09-20 DIAGNOSIS — L270 Generalized skin eruption due to drugs and medicaments taken internally: Secondary | ICD-10-CM | POA: Diagnosis not present

## 2017-10-04 DIAGNOSIS — L270 Generalized skin eruption due to drugs and medicaments taken internally: Secondary | ICD-10-CM | POA: Diagnosis not present

## 2017-10-04 DIAGNOSIS — Z79899 Other long term (current) drug therapy: Secondary | ICD-10-CM | POA: Diagnosis not present

## 2017-10-18 DIAGNOSIS — L308 Other specified dermatitis: Secondary | ICD-10-CM | POA: Diagnosis not present

## 2017-10-18 DIAGNOSIS — Z79899 Other long term (current) drug therapy: Secondary | ICD-10-CM | POA: Diagnosis not present

## 2017-11-21 DIAGNOSIS — Z79899 Other long term (current) drug therapy: Secondary | ICD-10-CM | POA: Diagnosis not present

## 2017-11-21 DIAGNOSIS — L270 Generalized skin eruption due to drugs and medicaments taken internally: Secondary | ICD-10-CM | POA: Diagnosis not present

## 2017-11-21 DIAGNOSIS — B359 Dermatophytosis, unspecified: Secondary | ICD-10-CM | POA: Diagnosis not present

## 2017-11-21 DIAGNOSIS — Z85828 Personal history of other malignant neoplasm of skin: Secondary | ICD-10-CM | POA: Diagnosis not present

## 2017-11-21 DIAGNOSIS — D485 Neoplasm of uncertain behavior of skin: Secondary | ICD-10-CM | POA: Diagnosis not present

## 2017-12-13 DIAGNOSIS — L821 Other seborrheic keratosis: Secondary | ICD-10-CM | POA: Diagnosis not present

## 2017-12-13 DIAGNOSIS — C44212 Basal cell carcinoma of skin of right ear and external auricular canal: Secondary | ICD-10-CM | POA: Diagnosis not present

## 2017-12-13 DIAGNOSIS — B352 Tinea manuum: Secondary | ICD-10-CM | POA: Diagnosis not present

## 2017-12-13 DIAGNOSIS — Z85828 Personal history of other malignant neoplasm of skin: Secondary | ICD-10-CM | POA: Diagnosis not present

## 2017-12-13 DIAGNOSIS — D225 Melanocytic nevi of trunk: Secondary | ICD-10-CM | POA: Diagnosis not present

## 2017-12-13 DIAGNOSIS — D1801 Hemangioma of skin and subcutaneous tissue: Secondary | ICD-10-CM | POA: Diagnosis not present

## 2017-12-25 ENCOUNTER — Encounter: Payer: Self-pay | Admitting: Internal Medicine

## 2017-12-25 ENCOUNTER — Non-Acute Institutional Stay: Payer: Medicare Other | Admitting: Internal Medicine

## 2017-12-25 VITALS — BP 110/60 | HR 62 | Temp 98.0°F | Wt 165.0 lb

## 2017-12-25 DIAGNOSIS — Z636 Dependent relative needing care at home: Secondary | ICD-10-CM

## 2017-12-25 DIAGNOSIS — B359 Dermatophytosis, unspecified: Secondary | ICD-10-CM

## 2017-12-25 DIAGNOSIS — I480 Paroxysmal atrial fibrillation: Secondary | ICD-10-CM | POA: Diagnosis not present

## 2017-12-25 DIAGNOSIS — Z9889 Other specified postprocedural states: Secondary | ICD-10-CM

## 2017-12-25 DIAGNOSIS — R21 Rash and other nonspecific skin eruption: Secondary | ICD-10-CM

## 2017-12-25 DIAGNOSIS — I10 Essential (primary) hypertension: Secondary | ICD-10-CM

## 2017-12-25 DIAGNOSIS — E039 Hypothyroidism, unspecified: Secondary | ICD-10-CM | POA: Diagnosis not present

## 2017-12-25 DIAGNOSIS — R609 Edema, unspecified: Secondary | ICD-10-CM | POA: Diagnosis not present

## 2017-12-25 MED ORDER — FUROSEMIDE 20 MG PO TABS: 20.0000 mg | ORAL_TABLET | Freq: Every day | ORAL | 0 refills | Status: DC

## 2017-12-25 MED ORDER — POTASSIUM CHLORIDE ER 10 MEQ PO TBCR: 10.0000 meq | EXTENDED_RELEASE_TABLET | Freq: Every day | ORAL | 0 refills | Status: DC

## 2017-12-25 NOTE — Progress Notes (Signed)
Location:  Occupational psychologist of Service:  Clinic (12)  Provider: Yosiah Jasmin L. Mariea Clonts, D.O., C.M.D.  Code Status: full code Goals of Care:  Advanced Directives 12/25/2017  Does Patient Have a Medical Advance Directive? Yes  Type of Paramedic of [place];Living will  Does patient want to make changes to medical advance directive? No - Patient declined  Copy of [place] in Chart? Yes     Chief Complaint  Patient presents with  . Medical Management of Chronic Issues    102mth follow-up    HPI: Patient is a 84 y.o. male seen today for medical management of chronic diseases.    His rash is finally almost resolved.  No longer using anything for it.  On his left hand, he then developed a rash on his left wrist and thumb region. He had a biopsy done at dermatology for this one which showed tinea (dermatophytosis).  He has been treated with pills and ointment.  It improved.  He then had a full body scan and there was a little cancerous spot on the back of his ear, biopsied it and it was not malignant.    He is still having swelling of his ankles and feet.  Says it's not his recent dietary changes with vacation, he notes.  His has been gong on for several weeks before that.  He does not watch his salt much which we discussed today.  He does not elevate his feet at rest.    His wife's ankles are much more swollen than his.  They went to [place].  Their daughter went along and it wouldn't have worked without her--Joan is not able to get around as well, her memory is totally gone.  She's now struggling to do simple tasks that she used to be able to do rapidly.  He's still hanging in with her at home for now.  Their daughter says he can't keep doing it, but he says he can for a little longer.  She still sleeps all of the time.  Their daughter lives in [place], [place].       Past Medical History:  Diagnosis Date  . Aortoiliac occlusive  disease ([place])   . Carotid artery disease ([place])   . Complication of anesthesia 1955   sodium pentathol ?  Marland Kitchen Contracture of palmar fascia 06/21/2010  . Cortical senile cataract 06/27/2010  . Dysrhythmia   . Enthesopathy of hip region 11/27/2009  . Essential hypertension, benign 06/21/2010  . GERD (gastroesophageal reflux disease)    occ  . Hyperlipidemia LDL goal <100 06/21/2010  . Hypertrophy of prostate without urinary obstruction and other lower urinary tract symptoms (LUTS) 06/21/2010  . Hypothyroidism 08/27/2011  . Malignant neoplasm of prostate ([place]) 06/21/2010  . Mitral regurgitation 10/07/2015   Severe MR noted on ECHO with partially flail post leaflet Oct 03 2015   . Nocturia 2011  . Osteoarthrosis, unspecified whether generalized or localized, unspecified site 06/21/2010  . Other and unspecified hyperlipidemia 06/21/2010  . Pain in joint, lower leg 02/25/2012  . Palpitations 08/14/2010  . Patent foramen ovale 01/04/2016   Closed at the time of mitral valve repair   . S/P minimally invasive mitral valve repair 01/04/2016   Complex valvuloplasty including triangular resection of flail segment of posterior leaflet, artificial Gore-tex neochord placement x6 and 30mm Sorin Memo 3D ring annuloplasty via right mini thoracotomy approach with closure of PFO and clipping of LA appendage   .  Unspecified constipation 06/21/2010  . Unspecified essential hypertension 06/21/2010  . Unspecified hypothyroidism 08/27/2011  . Urinary frequency 08/14/2008    Past Surgical History:  Procedure Laterality Date  . CARDIAC CATHETERIZATION N/A 11/10/2015   Procedure: Right/Left Heart Cath and Coronary Angiography;  Surgeon: Peter M Martinique, MD;  Location: [facility];  Service: Cardiovascular;  Laterality: N/A;  . CATARACT EXTRACTION W/ INTRAOCULAR LENS  IMPLANT, BILATERAL  2006  . CLIPPING OF ATRIAL APPENDAGE N/A 01/04/2016   Procedure: CLIPPING OF ATRIAL APPENDAGE;  Surgeon: Rexene Alberts, MD;  Location: [facility];  Service: Open Heart Surgery;  Laterality: N/A;  . KNEE ARTHROSCOPY WITH MENISCAL REPAIR Left 1968  . MITRAL VALVE REPAIR Right 01/04/2016   Procedure: MINIMALLY INVASIVE MITRAL VALVE REPAIR (MVR);  Surgeon: Rexene Alberts, MD;  Location: [facility];  Service: Open Heart Surgery;  Laterality: Right;  . PROSTATE SURGERY  2000   seed implants  . TEE WITHOUT CARDIOVERSION N/A 11/10/2015   Procedure: TRANSESOPHAGEAL ECHOCARDIOGRAM (TEE);  Surgeon: Skeet Latch, MD;  Location: [facility];  Service: Cardiovascular;  Laterality: N/A;  . TEE WITHOUT CARDIOVERSION N/A 01/04/2016   Procedure: TRANSESOPHAGEAL ECHOCARDIOGRAM (TEE);  Surgeon: Rexene Alberts, MD;  Location: [facility];  Service: Open Heart Surgery;  Laterality: N/A;  . TOTAL KNEE ARTHROPLASTY Right 1955   Dr. Ethel Rana    No Known Allergies  Outpatient Encounter Medications as of 12/25/2017  Medication Sig  . alfuzosin (UROXATRAL) 10 MG 24 hr tablet Take 10 mg by mouth daily with breakfast. Take one daily to reduce bladder spasm  . aspirin 81 MG tablet Take 81 mg by mouth daily. Take one daily for anticoagulation  . Levothyroxine Sodium 50 MCG CAPS Take 1 capsule (50 mcg total) by mouth daily before breakfast.  . losartan (COZAAR) 50 MG tablet Take 50 mg by mouth daily.   . metoprolol succinate (TOPROL-XL) 25 MG 24 hr tablet Take 12.5 mg by mouth daily.  . nabumetone (RELAFEN) 750 MG tablet TAKE 1 TABLET TWICE A DAY FOR SWELLING  . simvastatin (ZOCOR) 20 MG tablet TAKE 1 TABLET DAILY TO MAINTAIN LOW CHOLESTEROL  . Triamcinolone Acetonide (TRIAMCINOLONE 0.1 % CREAM : EUCERIN) CREA Apply 1 application topically 2 (two) times daily.  . valACYclovir (VALTREX) 1000 MG tablet Take 1 tablet (1,000 mg total) by mouth every 8 (eight) hours.  . VESICARE 5 MG tablet Take 5 mg by mouth daily.    No facility-administered encounter medications on file as of 12/25/2017.     Review of Systems:  Review of Systems  Constitutional: Negative for chills,  fever and malaise/fatigue.  HENT: Negative for congestion.   Eyes: Negative for blurred vision.  Respiratory: Negative for cough and shortness of breath.   Cardiovascular: Positive for leg swelling. Negative for chest pain, palpitations, orthopnea, claudication and PND.  Gastrointestinal: Negative for abdominal pain, blood in stool, constipation and melena.  Genitourinary: Negative for dysuria.  Musculoskeletal: Negative for falls and joint pain.  Skin: Positive for rash. Negative for itching.  Neurological: Negative for dizziness, loss of consciousness and weakness.  Psychiatric/Behavioral: Negative for depression and memory loss. The patient is nervous/anxious. The patient does not have insomnia.     Health Maintenance  Topic Date Due  . TETANUS/TDAP  02/27/2025  . INFLUENZA VACCINE  Completed  . PNA vac Low Risk Adult  Completed    Physical Exam: Vitals:   12/25/17 1324  BP: 110/60  Pulse: 62  Temp: 98 F (36.7 C)  TempSrc:  Oral  SpO2: 96%  Weight: 165 lb (74.8 kg)   Body mass index is 25.09 kg/m. Physical Exam  Constitutional: He is oriented to person, place, and time. He appears well-developed and well-nourished. No distress.  Cardiovascular: Normal rate, regular rhythm and intact distal pulses.  Murmur heard. Pulmonary/Chest: Effort normal and breath sounds normal. No respiratory distress.  Abdominal: Bowel sounds are normal.  Musculoskeletal: Normal range of motion.  Neurological: He is alert and oriented to person, place, and time.  Skin: Skin is warm and dry. There is erythema.  Left wrist, reports improved  Psychiatric: He has a normal mood and affect.    Labs reviewed: Basic Metabolic Panel: Recent Labs    08/13/17 0600  NA 135*  K 4.6  BUN 17  CREATININE 1.2  TSH 4.22   Liver Function Tests: Recent Labs    08/13/17 0600  AST 26  ALT 20  ALKPHOS 82   No results for input(s): LIPASE, AMYLASE in the last 8760 hours. No results for input(s):  AMMONIA in the last 8760 hours. CBC: Recent Labs    08/13/17 0600  WBC 5.0  HGB 13.8  HCT 42  PLT 234   Lipid Panel: Recent Labs    08/13/17 0600  CHOL 160  HDL 71*  LDLCALC 70  TRIG 95   Lab Results  Component Value Date   HGBA1C 5.5 01/02/2016    Assessment/Plan 1. Pitting edema - new onset, happened soon after his mitral valve and PFO repair, but then resolved, now back -discussed low sodium diet and elevating feet at rest--he will try to do these, but does have 2+ edema so will treat with lasix and potassium - furosemide (LASIX) 20 MG tablet; Take 1 tablet (20 mg total) by mouth daily.  Dispense: 30 tablet; Refill: 0 - potassium chloride (K-DUR) 10 MEQ tablet; Take 1 tablet (10 mEq total) by mouth daily.  Dispense: 30 tablet; Refill: 0 -f/u labs before next visit -he's to call if edema not improving, he will keep a log of his swelling -weight is up, but just two lbs which he relates to travel to [place]  2. Rash of back -shingles shot-induced, now better  3. Dermatophytosis -improving with pills and cream from derm after biopsy showed this  4. Caregiver stress -ongoing, seems to be worsening--see hpi -will try to arrange a visit with pt, his wife and their daughter to discuss future plans to provide more support in the home and possibly eventual [address] for Mrs. Armstrong  5. Paroxysmal atrial fibrillation (HCC) -stable, regular today on exam, cont current regimen  6. Essential hypertension, benign -bp at goal with current therapy, no changes  7. Hypothyroidism, unspecified type -cont tirosint--given coupon to use  8. S/P minimally invasive mitral valve repair -2 years ago -has done overall very well  Labs/tests ordered:  Cbc, cmp, tsh, flp before Next appt:  4 mos med mgt  Endy Easterly L. Jaryn Hocutt, D.O. [facility] [address], [place] [address] Cell Phone (Mon-Fri 8am-5pm):  [phone] On Call:   [phone] & follow prompts after 5pm & weekends Office Phone:  [phone] Office Fax:  [phone]

## 2017-12-25 NOTE — Patient Instructions (Addendum)
Try not to add salt to your diet.  Elevate your feet at rest.   Take the lasix and potassium and monitor your swelling.

## 2018-01-10 DIAGNOSIS — H26491 Other secondary cataract, right eye: Secondary | ICD-10-CM | POA: Diagnosis not present

## 2018-01-28 ENCOUNTER — Other Ambulatory Visit: Payer: Self-pay | Admitting: Internal Medicine

## 2018-02-10 DIAGNOSIS — L270 Generalized skin eruption due to drugs and medicaments taken internally: Secondary | ICD-10-CM | POA: Diagnosis not present

## 2018-02-10 DIAGNOSIS — B352 Tinea manuum: Secondary | ICD-10-CM | POA: Diagnosis not present

## 2018-02-10 DIAGNOSIS — Z85828 Personal history of other malignant neoplasm of skin: Secondary | ICD-10-CM | POA: Diagnosis not present

## 2018-02-18 ENCOUNTER — Non-Acute Institutional Stay: Payer: Medicare Other

## 2018-02-18 VITALS — BP 132/60 | HR 80 | Temp 97.5°F | Ht 68.0 in | Wt 161.0 lb

## 2018-02-18 DIAGNOSIS — Z0000 Encounter for general adult medical examination without abnormal findings: Secondary | ICD-10-CM

## 2018-02-18 NOTE — Patient Instructions (Addendum)
[name] , Thank you for taking time to come for your Medicare Wellness Visit. I appreciate your ongoing commitment to your health goals. Please review the following plan we discussed and let me know if I can assist you in the future.   Screening recommendations/referrals: Colonoscopy excluded Recommended yearly ophthalmology/optometry visit for glaucoma screening and checkup Recommended yearly dental visit for hygiene and checkup  Vaccinations: Influenza vaccine up to date, due 2019 fall season Pneumococcal vaccine up to date, completed Tdap vaccine up to date, due 02/27/2025 Shingles vaccine up to date, completed    Advanced directives: in chart  Conditions/risks identified: none  Next appointment: Dr. Mariea Clonts 05/07/2018 @ 1:30pm  Preventive Care 60 Years and Older, Male Preventive care refers to lifestyle choices and visits with your health care provider that can promote health and wellness. What does preventive care include?  A yearly physical exam. This is also called an annual well check.  Dental exams once or twice a year.  Routine eye exams. Ask your health care provider how often you should have your eyes checked.  Personal lifestyle choices, including:  Daily care of your teeth and gums.  Regular physical activity.  Eating a healthy diet.  Avoiding tobacco and drug use.  Limiting alcohol use.  Practicing safe sex.  Taking low doses of aspirin every day.  Taking vitamin and mineral supplements as recommended by your health care provider. What happens during an annual well check? The services and screenings done by your health care provider during your annual well check will depend on your age, overall health, lifestyle risk factors, and family history of disease. Counseling  Your health care provider may ask you questions about your:  Alcohol use.  Tobacco use.  Drug use.  Emotional well-being.  Home and relationship well-being.  Sexual  activity.  Eating habits.  History of falls.  Memory and ability to understand (cognition).  Work and work Statistician. Screening  You may have the following tests or measurements:  Height, weight, and BMI.  Blood pressure.  Lipid and cholesterol levels. These may be checked every 5 years, or more frequently if you are over 69 years old.  Skin check.  Lung cancer screening. You may have this screening every year starting at age 64 if you have a 30-pack-year history of smoking and currently smoke or have quit within the past 15 years.  Fecal occult blood test (FOBT) of the stool. You may have this test every year starting at age 35.  Flexible sigmoidoscopy or colonoscopy. You may have a sigmoidoscopy every 5 years or a colonoscopy every 10 years starting at age 37.  Prostate cancer screening. Recommendations will vary depending on your family history and other risks.  Hepatitis C blood test.  Hepatitis B blood test.  Sexually transmitted disease (STD) testing.  Diabetes screening. This is done by checking your blood sugar (glucose) after you have not eaten for a while (fasting). You may have this done every 1-3 years.  Abdominal aortic aneurysm (AAA) screening. You may need this if you are a current or former smoker.  Osteoporosis. You may be screened starting at age 18 if you are at high risk. Talk with your health care provider about your test results, treatment options, and if necessary, the need for more tests. Vaccines  Your health care provider may recommend certain vaccines, such as:  Influenza vaccine. This is recommended every year.  Tetanus, diphtheria, and acellular pertussis (Tdap, Td) vaccine. You may need a Td booster  every 10 years.  Zoster vaccine. You may need this after age 22.  Pneumococcal 13-valent conjugate (PCV13) vaccine. One dose is recommended after age 14.  Pneumococcal polysaccharide (PPSV23) vaccine. One dose is recommended after age  75. Talk to your health care provider about which screenings and vaccines you need and how often you need them. This information is not intended to replace advice given to you by your health care provider. Make sure you discuss any questions you have with your health care provider. Document Released: 11/04/2015 Document Revised: 06/27/2016 Document Reviewed: 08/09/2015 Elsevier Interactive Patient Education  2017 [place] Prevention in the Home Falls can cause injuries. They can happen to people of all ages. There are many things you can do to make your home safe and to help prevent falls. What can I do on the outside of my home?  Regularly fix the edges of walkways and driveways and fix any cracks.  Remove anything that might make you trip as you walk through a door, such as a raised step or threshold.  Trim any bushes or trees on the path to your home.  Use bright outdoor lighting.  Clear any walking paths of anything that might make someone trip, such as rocks or tools.  Regularly check to see if handrails are loose or broken. Make sure that both sides of any steps have handrails.  Any raised decks and porches should have guardrails on the edges.  Have any leaves, snow, or ice cleared regularly.  Use sand or salt on walking paths during winter.  Clean up any spills in your garage right away. This includes oil or grease spills. What can I do in the bathroom?  Use night lights.  Install grab bars by the toilet and in the tub and shower. Do not use towel bars as grab bars.  Use non-skid mats or decals in the tub or shower.  If you need to sit down in the shower, use a plastic, non-slip stool.  Keep the floor dry. Clean up any water that spills on the floor as soon as it happens.  Remove soap buildup in the tub or shower regularly.  Attach bath mats securely with double-sided non-slip rug tape.  Do not have throw rugs and other things on the floor that can make  you trip. What can I do in the bedroom?  Use night lights.  Make sure that you have a light by your bed that is easy to reach.  Do not use any sheets or blankets that are too big for your bed. They should not hang down onto the floor.  Have a firm chair that has side arms. You can use this for support while you get dressed.  Do not have throw rugs and other things on the floor that can make you trip. What can I do in the kitchen?  Clean up any spills right away.  Avoid walking on wet floors.  Keep items that you use a lot in easy-to-reach places.  If you need to reach something above you, use a strong step stool that has a grab bar.  Keep electrical cords out of the way.  Do not use floor polish or wax that makes floors slippery. If you must use wax, use non-skid floor wax.  Do not have throw rugs and other things on the floor that can make you trip. What can I do with my stairs?  Do not leave any items on the stairs.  Make  sure that there are handrails on both sides of the stairs and use them. Fix handrails that are broken or loose. Make sure that handrails are as long as the stairways.  Check any carpeting to make sure that it is firmly attached to the stairs. Fix any carpet that is loose or worn.  Avoid having throw rugs at the top or bottom of the stairs. If you do have throw rugs, attach them to the floor with carpet tape.  Make sure that you have a light switch at the top of the stairs and the bottom of the stairs. If you do not have them, ask someone to add them for you. What else can I do to help prevent falls?  Wear shoes that:  Do not have high heels.  Have rubber bottoms.  Are comfortable and fit you well.  Are closed at the toe. Do not wear sandals.  If you use a stepladder:  Make sure that it is fully opened. Do not climb a closed stepladder.  Make sure that both sides of the stepladder are locked into place.  Ask someone to hold it for you, if  possible.  Clearly mark and make sure that you can see:  Any grab bars or handrails.  First and last steps.  Where the edge of each step is.  Use tools that help you move around (mobility aids) if they are needed. These include:  Canes.  Walkers.  Scooters.  Crutches.  Turn on the lights when you go into a dark area. Replace any light bulbs as soon as they burn out.  Set up your furniture so you have a clear path. Avoid moving your furniture around.  If any of your floors are uneven, fix them.  If there are any pets around you, be aware of where they are.  Review your medicines with your doctor. Some medicines can make you feel dizzy. This can increase your chance of falling. Ask your doctor what other things that you can do to help prevent falls. This information is not intended to replace advice given to you by your health care provider. Make sure you discuss any questions you have with your health care provider. Document Released: 08/04/2009 Document Revised: 03/15/2016 Document Reviewed: 11/12/2014 Elsevier Interactive Patient Education  2017 [employer].

## 2018-02-18 NOTE — Progress Notes (Signed)
Subjective:   Samuel [name] is a 84 y.o. male who presents for Medicare Annual/Subsequent preventive examination at [facility]  Last AWV-12/12/2016    Objective:    Vitals: BP 132/60 (BP Location: Right Arm, Patient Position: Sitting)   Pulse 80   Temp (!) 97.5 F (36.4 C) (Oral)   Ht 5\' 8"  (1.727 m)   Wt 161 lb (73 kg)   SpO2 96%   BMI 24.48 kg/m   Body mass index is 24.48 kg/m.  Advanced Directives 02/18/2018 12/25/2017 12/12/2016 08/29/2016 08/08/2016 05/09/2016 02/08/2016  Does Patient Have a Medical Advance Directive? Yes Yes Yes Yes Yes Yes Yes  Type of Paramedic of [place];Living will [place];Living will [place];Living will Living will;Healthcare Power of Attorney Living will;Healthcare Power of [place];Living will Living will;Healthcare Power of Attorney  Does patient want to make changes to medical advance directive? No - Patient declined No - Patient declined - - - - -  Copy of [place] in Chart? Yes Yes Yes Yes Yes Yes Yes    Tobacco Social History   Tobacco Use  Smoking Status Former Smoker  . Packs/day: 0.50  . Years: 5.00  . Pack years: 2.50  . Types: Cigarettes  . Last attempt to quit: 09/04/1969  . Years since quitting: 48.4  Smokeless Tobacco Never Used     Counseling given: Not Answered   Clinical Intake:  Pre-visit preparation completed: No        Nutritional Risks: None Diabetes: No  What is the last grade level you completed in school?: PHD  Interpreter Needed?: No  Information entered by :: Samuel Dense, RN  Past Medical History:  Diagnosis Date  . Aortoiliac occlusive disease ([place])   . Carotid artery disease ([place])   . Complication of anesthesia 1955   sodium pentathol ?  Marland Kitchen Contracture of palmar fascia 06/21/2010  . Cortical senile cataract 06/27/2010  . Dysrhythmia   . Enthesopathy of hip  region 11/27/2009  . Essential hypertension, benign 06/21/2010  . GERD (gastroesophageal reflux disease)    occ  . Hyperlipidemia LDL goal <100 06/21/2010  . Hypertrophy of prostate without urinary obstruction and other lower urinary tract symptoms (LUTS) 06/21/2010  . Hypothyroidism 08/27/2011  . Malignant neoplasm of prostate ([place]) 06/21/2010  . Mitral regurgitation 10/07/2015   Severe MR noted on ECHO with partially flail post leaflet Oct 03 2015   . Nocturia 2011  . Osteoarthrosis, unspecified whether generalized or localized, unspecified site 06/21/2010  . Other and unspecified hyperlipidemia 06/21/2010  . Pain in joint, lower leg 02/25/2012  . Palpitations 08/14/2010  . Patent foramen ovale 01/04/2016   Closed at the time of mitral valve repair   . S/P minimally invasive mitral valve repair 01/04/2016   Complex valvuloplasty including triangular resection of flail segment of posterior leaflet, artificial Gore-tex neochord placement x6 and 49mm Sorin Memo 3D ring annuloplasty via right mini thoracotomy approach with closure of PFO and clipping of LA appendage   . Unspecified constipation 06/21/2010  . Unspecified essential hypertension 06/21/2010  . Unspecified hypothyroidism 08/27/2011  . Urinary frequency 08/14/2008   Past Surgical History:  Procedure Laterality Date  . CARDIAC CATHETERIZATION N/A 11/10/2015   Procedure: Right/Left Heart Cath and Coronary Angiography;  Surgeon: Samuel M Martinique, MD;  Location: [facility];  Service: Cardiovascular;  Laterality: N/A;  . CATARACT EXTRACTION W/ INTRAOCULAR LENS  IMPLANT, BILATERAL  2006  .  CLIPPING OF ATRIAL APPENDAGE N/A 01/04/2016   Procedure: CLIPPING OF ATRIAL APPENDAGE;  Surgeon: Samuel Alberts, MD;  Location: [facility];  Service: Open Heart Surgery;  Laterality: N/A;  . KNEE ARTHROSCOPY WITH MENISCAL REPAIR Left 1968  . MITRAL VALVE REPAIR Right 01/04/2016   Procedure: MINIMALLY INVASIVE MITRAL VALVE REPAIR (MVR);  Surgeon: Samuel Alberts,  MD;  Location: [facility];  Service: Open Heart Surgery;  Laterality: Right;  . PROSTATE SURGERY  2000   seed implants  . TEE WITHOUT CARDIOVERSION N/A 11/10/2015   Procedure: TRANSESOPHAGEAL ECHOCARDIOGRAM (TEE);  Surgeon: Samuel Latch, MD;  Location: [facility];  Service: Cardiovascular;  Laterality: N/A;  . TEE WITHOUT CARDIOVERSION N/A 01/04/2016   Procedure: TRANSESOPHAGEAL ECHOCARDIOGRAM (TEE);  Surgeon: Samuel Alberts, MD;  Location: [facility];  Service: Open Heart Surgery;  Laterality: N/A;  . TOTAL KNEE ARTHROPLASTY Right 1955   Dr. Ethel [name]   Family History  Problem Relation Age of Onset  . Heart disease Mother        myocardial infarction  . Heart disease Father        myocardial infarction   Social History   Socioeconomic History  . Marital status: Married    Spouse name: Not on file  . Number of children: Not on file  . Years of education: Not on file  . Highest education level: Not on file  Occupational History  . Occupation: retired Engineer, materials at [employer]  . Financial resource strain: Not hard at all  . Food insecurity:    Worry: Never true    Inability: Never true  . Transportation needs:    Medical: No    Non-medical: No  Tobacco Use  . Smoking status: Former Smoker    Packs/day: 0.50    Years: 5.00    Pack years: 2.50    Types: Cigarettes    Last attempt to quit: 09/04/1969    Years since quitting: 48.4  . Smokeless tobacco: Never Used  Substance and Sexual Activity  . Alcohol use: Yes    Alcohol/week: 1.2 oz    Types: 2 Shots of liquor per week    Comment: 2 daily  . Drug use: No  . Sexual activity: Not on file  Lifestyle  . Physical activity:    Days per week: 4 days    Minutes per session: 30 min  . Stress: To some extent  Relationships  . Social connections:    Talks on phone: More than three times a week    Gets together: More than three times a week    Attends religious service: More than 4 times per year     Active member of club or organization: No    Attends meetings of clubs or organizations: Never    Relationship status: Married  Other Topics Concern  . Not on file  Social History Narrative   Lives at [facility] since 05/2010   Married Samuel [name]    Has living will, POA   Retired from [employer] after 29 years   Stopped smoking 1970   Exercise run 1 1/2 mile every other day, swim 15 minutes every other day, use machines in gym   Alcohol 2 drinks daily    Outpatient Encounter Medications as of 02/18/2018  Medication Sig  . alfuzosin (UROXATRAL) 10 MG 24 hr tablet Take 10 mg by mouth daily with breakfast. Take one daily to reduce bladder spasm  . aspirin 81 MG tablet Take 81 mg by  mouth daily. Take one daily for anticoagulation  . augmented betamethasone dipropionate (DIPROLENE-AF) 0.05 % cream Apply 1 application topically daily.  Marland Kitchen losartan (COZAAR) 50 MG tablet Take 50 mg by mouth daily.   . metoprolol succinate (TOPROL-XL) 25 MG 24 hr tablet Take 12.5 mg by mouth daily.  . mycophenolate (CELLCEPT) 500 MG tablet Take 1 tablet by mouth 2 (two) times daily.  . nabumetone (RELAFEN) 750 MG tablet TAKE 1 TABLET TWICE A DAY FOR SWELLING  . potassium chloride (K-DUR) 10 MEQ tablet Take 1 tablet (10 mEq total) by mouth daily.  . simvastatin (ZOCOR) 20 MG tablet TAKE 1 TABLET DAILY TO MAINTAIN LOW CHOLESTEROL  . TIROSINT 50 MCG CAPS TAKE 1 CAPSULE DAILY BEFORE BREAKFAST  . Triamcinolone Acetonide (TRIAMCINOLONE 0.1 % CREAM : EUCERIN) CREA Apply 1 application topically 2 (two) times daily.  . valACYclovir (VALTREX) 1000 MG tablet Take 1 tablet (1,000 mg total) by mouth every 8 (eight) hours.  . VESICARE 5 MG tablet Take 5 mg by mouth daily.   . [DISCONTINUED] furosemide (LASIX) 20 MG tablet Take 1 tablet (20 mg total) by mouth daily.   No facility-administered encounter medications on file as of 02/18/2018.     Activities of Daily Living In your present state of health, do you have any difficulty  performing the following activities: 02/18/2018  Hearing? N  Vision? N  Difficulty concentrating or making decisions? N  Walking or climbing stairs? N  Dressing or bathing? N  Doing errands, shopping? N  Preparing Food and eating ? N  Using the Toilet? N  In the past six months, have you accidently leaked urine? N  Do you have problems with loss of bowel control? N  Managing your Medications? N  Managing your Finances? N  Housekeeping or managing your Housekeeping? N  Some recent data might be hidden    Patient Care Team: Gayland Curry, DO as PCP - General (Geriatric Medicine) [facility], [facility], [place] Donovan, NP as Nurse Practitioner (Geriatric Medicine) [name], MD as Consulting Physician (Cardiology) Raynelle Bring, MD as Consulting Physician (Urology)   Assessment:   This is a routine wellness examination for Samuel [name].  Exercise Activities and Dietary recommendations Current Exercise Habits: Home exercise routine, Type of exercise: walking;strength training/weights, Time (Minutes): 30, Frequency (Times/Week): 4, Weekly Exercise (Minutes/Week): 120, Intensity: Mild, Exercise limited by: None identified  Goals    None      Fall Risk Fall Risk  02/18/2018 12/25/2017 08/21/2017 04/17/2017 08/29/2016  Falls in the past year? No No No No No   Is the patient's home free of loose throw rugs in walkways, pet beds, electrical cords, etc?   yes      Grab bars in the bathroom? yes      Handrails on the stairs?   yes      Adequate lighting?   yes  Depression Screen PHQ 2/9 Scores 02/18/2018 12/25/2017 08/21/2017 04/17/2017  PHQ - 2 Score 0 0 0 0    Cognitive Function MMSE - Mini Mental State Exam 02/18/2018 12/12/2016 11/02/2015  Orientation to time 5 5 5   Orientation to Place 5 5 5   Registration 3 3 3   Attention/ Calculation 4 5 5   Recall 2 1 1   Language- name 2 objects 2 2 2   Language- repeat 1 1 1   Language- follow 3 step command 3 3 3   Language-  read & follow direction 1 1 1   Write a sentence 1 1 1   Copy  design 1 1 1   Total score 28 28 28         Immunization History  Administered Date(s) Administered  . Influenza, High Dose Seasonal PF 08/16/2017  . Influenza,inj,Quad PF,6+ Mos 07/20/2016  . Influenza-Unspecified 07/27/2013, 07/07/2014, 08/11/2015  . Pneumococcal Conjugate-13 10/26/2014  . Pneumococcal Polysaccharide-23 11/23/2015  . Td 10/23/2007  . Tdap 02/28/2015  . Zoster 10/22/2008  . Zoster Recombinat (Shingrix) 12/03/2016, 03/06/2017    Qualifies for Shingles Vaccine? No, up to date  Screening Tests Health Maintenance  Topic Date Due  . INFLUENZA VACCINE  05/22/2018  . TETANUS/TDAP  02/27/2025  . PNA vac Low Risk Adult  Completed   Cancer Screenings: Lung: Low Dose CT Chest recommended if Age 74-80 years, 30 pack-year currently smoking OR have quit w/in 15years. Patient does not qualify. Colorectal: up to date  Additional Screenings:  Hepatitis C Screening:declined      Plan:    I have personally reviewed and addressed the Medicare Annual Wellness questionnaire and have noted the following in the patient's chart:  A. Medical and social history B. Use of alcohol, tobacco or illicit drugs  C. Current medications and supplements D. Functional ability and status E.  Nutritional status F.  Physical activity G. Advance directives H. List of other physicians I.  Hospitalizations, surgeries, and ER visits in previous 12 months J.  [place] to include hearing, vision, cognitive, depression L. Referrals and appointments - none  In addition, I have reviewed and discussed with patient certain preventive protocols, quality metrics, and best practice recommendations. A written personalized care plan for preventive services as well as general preventive health recommendations were provided to patient.  See attached scanned questionnaire for additional information.   Signed,   Samuel Dense,  RN Nurse Health Advisor  Patient Concerns: Rash is still bothering him but my have improved a little bit

## 2018-02-24 DIAGNOSIS — I6529 Occlusion and stenosis of unspecified carotid artery: Secondary | ICD-10-CM | POA: Diagnosis not present

## 2018-02-24 DIAGNOSIS — Z8546 Personal history of malignant neoplasm of prostate: Secondary | ICD-10-CM | POA: Diagnosis not present

## 2018-02-24 DIAGNOSIS — E7849 Other hyperlipidemia: Secondary | ICD-10-CM | POA: Diagnosis not present

## 2018-02-24 DIAGNOSIS — I480 Paroxysmal atrial fibrillation: Secondary | ICD-10-CM | POA: Diagnosis not present

## 2018-02-24 DIAGNOSIS — Z954 Presence of other heart-valve replacement: Secondary | ICD-10-CM | POA: Diagnosis not present

## 2018-02-24 DIAGNOSIS — K219 Gastro-esophageal reflux disease without esophagitis: Secondary | ICD-10-CM | POA: Diagnosis not present

## 2018-03-03 DIAGNOSIS — Z85828 Personal history of other malignant neoplasm of skin: Secondary | ICD-10-CM | POA: Diagnosis not present

## 2018-03-03 DIAGNOSIS — L270 Generalized skin eruption due to drugs and medicaments taken internally: Secondary | ICD-10-CM | POA: Diagnosis not present

## 2018-03-03 DIAGNOSIS — D170 Benign lipomatous neoplasm of skin and subcutaneous tissue of head, face and neck: Secondary | ICD-10-CM | POA: Diagnosis not present

## 2018-03-05 IMAGING — DX DG CHEST 2V
2 series · 2 of 2 positions shown · non-contrast
Comparison: Chest radiograph from one day prior.

CLINICAL DATA: Right pleural effusion

EXAM:
CHEST  2 VIEW

[w chest pa]
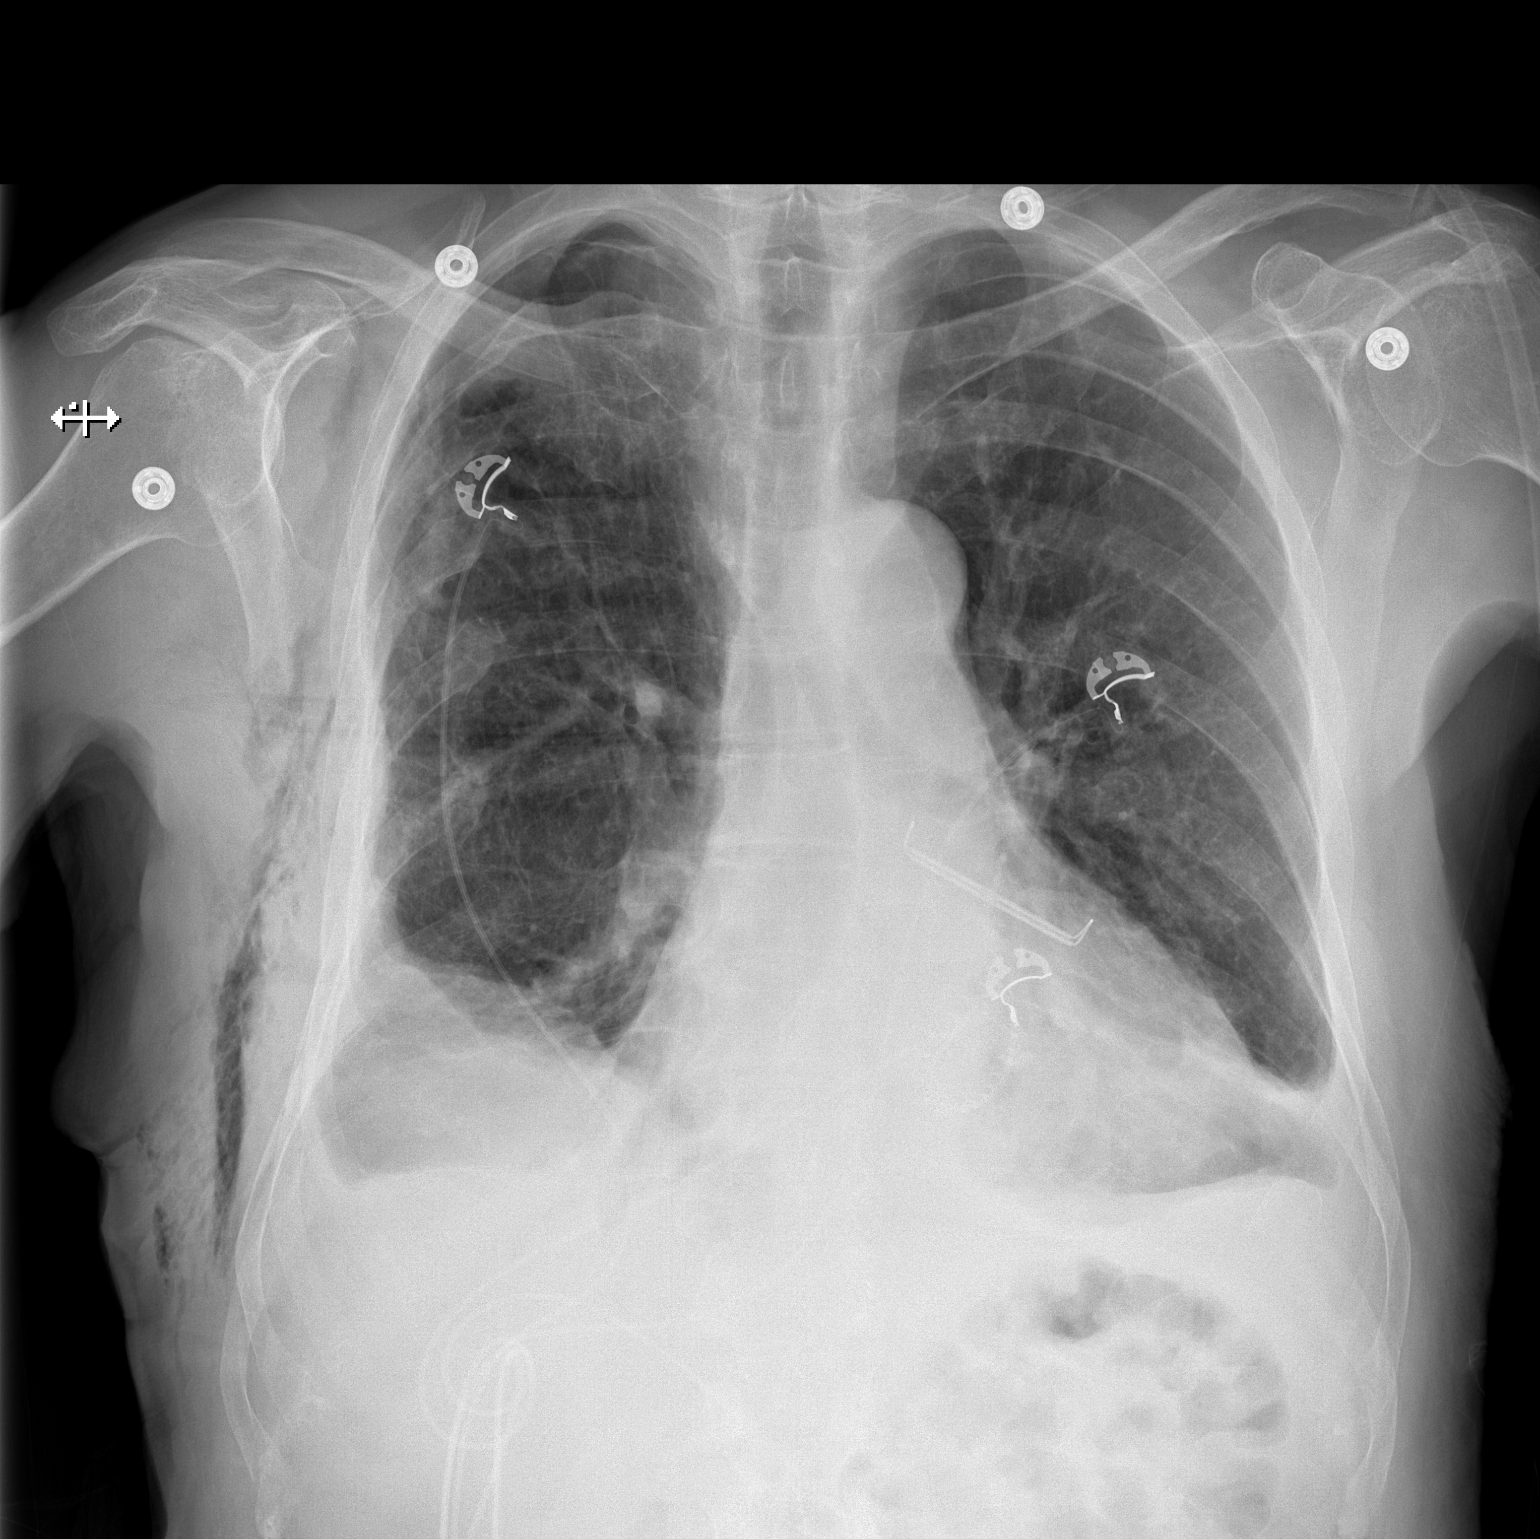

[w chest lat]
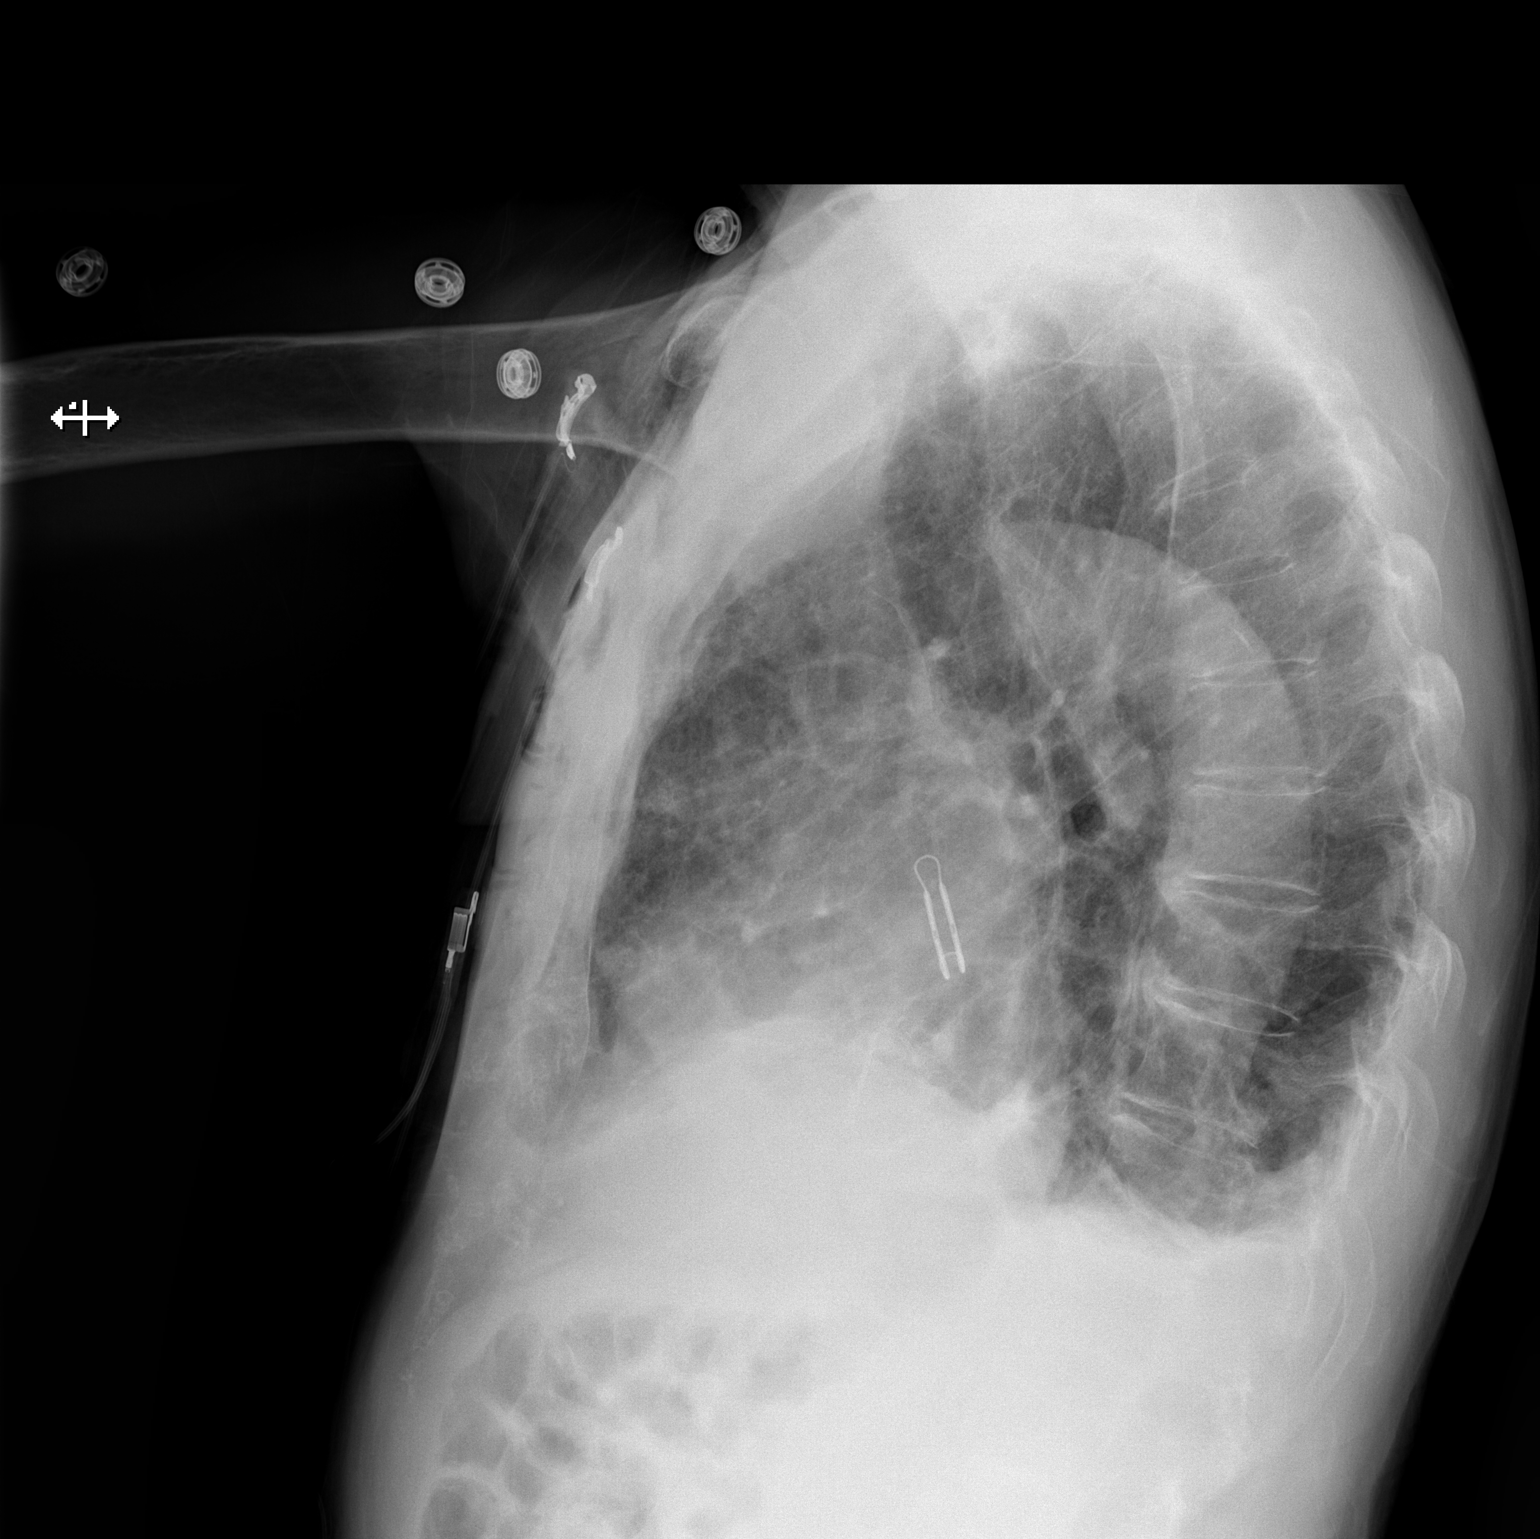

[2 of 2 positions shown; findings below may reference images not displayed]

FINDINGS: Cardiac valvular prosthesis is in place. Stable cardiomediastinal
silhouette with top-normal heart size. Stable tiny right apical
pneumothorax. No left pneumothorax. Stable small right pleural
effusion. Small left pleural effusion, slightly decreased. Mild to
moderate bibasilar atelectasis, slightly decreased. No overt
pulmonary edema. Stable subcutaneous emphysema in the right lateral
chest wall.
IMPRESSION: 1. Stable small right hydropneumothorax, with stable tiny apical
pneumothorax component and stable small basilar pleural effusion
component.
2. Small left pleural effusion, slightly decreased .
3. Mild-to-moderate bibasilar atelectasis, slightly improved.

## 2018-03-24 DIAGNOSIS — L270 Generalized skin eruption due to drugs and medicaments taken internally: Secondary | ICD-10-CM | POA: Diagnosis not present

## 2018-03-24 DIAGNOSIS — Z85828 Personal history of other malignant neoplasm of skin: Secondary | ICD-10-CM | POA: Diagnosis not present

## 2018-03-27 ENCOUNTER — Telehealth: Payer: Self-pay | Admitting: *Deleted

## 2018-03-27 NOTE — Telephone Encounter (Signed)
Left message for patient to return my call, received refill request for Klor Con from [facility], medication not on med list.

## 2018-03-28 NOTE — Telephone Encounter (Signed)
Spoke with patient and he is no longer taking lasix or potassium. Removed from med list.

## 2018-04-22 DIAGNOSIS — L270 Generalized skin eruption due to drugs and medicaments taken internally: Secondary | ICD-10-CM | POA: Diagnosis not present

## 2018-04-22 DIAGNOSIS — Z85828 Personal history of other malignant neoplasm of skin: Secondary | ICD-10-CM | POA: Diagnosis not present

## 2018-04-29 DIAGNOSIS — D649 Anemia, unspecified: Secondary | ICD-10-CM | POA: Diagnosis not present

## 2018-04-29 DIAGNOSIS — Z9889 Other specified postprocedural states: Secondary | ICD-10-CM | POA: Diagnosis not present

## 2018-04-29 DIAGNOSIS — E785 Hyperlipidemia, unspecified: Secondary | ICD-10-CM | POA: Diagnosis not present

## 2018-04-29 DIAGNOSIS — I10 Essential (primary) hypertension: Secondary | ICD-10-CM | POA: Diagnosis not present

## 2018-04-29 DIAGNOSIS — E039 Hypothyroidism, unspecified: Secondary | ICD-10-CM | POA: Diagnosis not present

## 2018-04-29 DIAGNOSIS — R609 Edema, unspecified: Secondary | ICD-10-CM | POA: Diagnosis not present

## 2018-04-29 LAB — TSH: TSH: 5.03 (ref 0.41–5.90)

## 2018-04-29 LAB — LIPID PANEL
Cholesterol: 164 (ref 0–200)
HDL: 75 — AB (ref 35–70)
LDL Cholesterol: 73
LDl/HDL Ratio: 2.2
Triglycerides: 81 (ref 40–160)

## 2018-04-29 LAB — HEPATIC FUNCTION PANEL
ALT: 25 (ref 10–40)
AST: 29 (ref 14–40)
Alkaline Phosphatase: 80 (ref 25–125)
Bilirubin, Total: 0.5

## 2018-04-29 LAB — CBC AND DIFFERENTIAL
HCT: 42 (ref 41–53)
Hemoglobin: 13.5 (ref 13.5–17.5)
Platelets: 225 (ref 150–399)
WBC: 4.4

## 2018-04-29 LAB — BASIC METABOLIC PANEL
BUN: 18 (ref 4–21)
Creatinine: 1.2 (ref 0.6–1.3)
Glucose: 97
Potassium: 4.6 (ref 3.4–5.3)
Sodium: 140 (ref 137–147)

## 2018-04-30 ENCOUNTER — Encounter: Payer: Self-pay | Admitting: Internal Medicine

## 2018-05-07 ENCOUNTER — Encounter: Payer: Self-pay | Admitting: Internal Medicine

## 2018-05-07 ENCOUNTER — Non-Acute Institutional Stay: Payer: Medicare Other | Admitting: Internal Medicine

## 2018-05-07 VITALS — BP 122/60 | HR 78 | Ht 68.0 in | Wt 158.0 lb

## 2018-05-07 DIAGNOSIS — I7409 Other arterial embolism and thrombosis of abdominal aorta: Secondary | ICD-10-CM | POA: Diagnosis not present

## 2018-05-07 DIAGNOSIS — Z9889 Other specified postprocedural states: Secondary | ICD-10-CM | POA: Diagnosis not present

## 2018-05-07 DIAGNOSIS — I10 Essential (primary) hypertension: Secondary | ICD-10-CM | POA: Diagnosis not present

## 2018-05-07 DIAGNOSIS — B359 Dermatophytosis, unspecified: Secondary | ICD-10-CM | POA: Diagnosis not present

## 2018-05-07 DIAGNOSIS — I480 Paroxysmal atrial fibrillation: Secondary | ICD-10-CM

## 2018-05-07 DIAGNOSIS — E039 Hypothyroidism, unspecified: Secondary | ICD-10-CM

## 2018-05-07 DIAGNOSIS — Z636 Dependent relative needing care at home: Secondary | ICD-10-CM

## 2018-05-07 DIAGNOSIS — E7800 Pure hypercholesterolemia, unspecified: Secondary | ICD-10-CM

## 2018-05-07 NOTE — Progress Notes (Signed)
Location:  [facility] clinic Provider:  Marshawn Ninneman L. Mariea Clonts, D.O., C.M.D.  Code Status: DNR Goals of Care:  Advanced Directives 02/18/2018  Does Patient Have a Medical Advance Directive? Yes  Type of Paramedic of [place];Living will  Does patient want to make changes to medical advance directive? No - Patient declined  Copy of [place] in Chart? Yes     Chief Complaint  Patient presents with  . Medical Management of Chronic Issues    49mth follow-up    HPI: Patient is a 84 y.o. male seen today for medical management of chronic diseases.      Nursing Home from 05/07/2018 in [facility]  Alcohol Use Disorder Identification Test Final Score (AUDIT)  4      Reviewed labs.  All good.  Says his stress is alright.  Some days are better than others.  His wife is sticking with the contract going to the exercise class three times a week.  She then goes to sleep on the couch afterwards.  She is going to the classes less reluctantly.    No changes in urinary symptoms. He's no longer seeing urology after clearance from prostate problems.    BP is good.  No chest pain, sob.    Sleeps well.    Still seeing derm and itching on his back is improved.  Still itches on scalp and upper shoulder area.  He has been getting injections in the places that itch and that's helped.  Dr. Elvera Lennox.    Past Medical History:  Diagnosis Date  . Aortoiliac occlusive disease ([place])   . Carotid artery disease ([place])   . Complication of anesthesia 1955   sodium pentathol ?  Marland Kitchen Contracture of palmar fascia 06/21/2010  . Cortical senile cataract 06/27/2010  . Dysrhythmia   . Enthesopathy of hip region 11/27/2009  . Essential hypertension, benign 06/21/2010  . GERD (gastroesophageal reflux disease)    occ  . Hyperlipidemia LDL goal <100 06/21/2010  . Hypertrophy of prostate without urinary obstruction and other lower urinary tract symptoms (LUTS) 06/21/2010  .  Hypothyroidism 08/27/2011  . Malignant neoplasm of prostate ([place]) 06/21/2010  . Mitral regurgitation 10/07/2015   Severe MR noted on ECHO with partially flail post leaflet Oct 03 2015   . Nocturia 2011  . Osteoarthrosis, unspecified whether generalized or localized, unspecified site 06/21/2010  . Other and unspecified hyperlipidemia 06/21/2010  . Pain in joint, lower leg 02/25/2012  . Palpitations 08/14/2010  . Patent foramen ovale 01/04/2016   Closed at the time of mitral valve repair   . S/P minimally invasive mitral valve repair 01/04/2016   Complex valvuloplasty including triangular resection of flail segment of posterior leaflet, artificial Gore-tex neochord placement x6 and 28mm Sorin Memo 3D ring annuloplasty via right mini thoracotomy approach with closure of PFO and clipping of LA appendage   . Unspecified constipation 06/21/2010  . Unspecified essential hypertension 06/21/2010  . Unspecified hypothyroidism 08/27/2011  . Urinary frequency 08/14/2008    Past Surgical History:  Procedure Laterality Date  . CARDIAC CATHETERIZATION N/A 11/10/2015   Procedure: Right/Left Heart Cath and Coronary Angiography;  Surgeon: Peter M Martinique, MD;  Location: [place] [facility];  Service: Cardiovascular;  Laterality: N/A;  . CATARACT EXTRACTION W/ INTRAOCULAR LENS  IMPLANT, BILATERAL  2006  . CLIPPING OF ATRIAL APPENDAGE N/A 01/04/2016   Procedure: CLIPPING OF ATRIAL APPENDAGE;  Surgeon: Rexene Alberts, MD;  Location: [place];  Service: Open Heart Surgery;  Laterality: N/A;  . KNEE ARTHROSCOPY WITH MENISCAL REPAIR Left 1968  . MITRAL VALVE REPAIR Right 01/04/2016   Procedure: MINIMALLY INVASIVE MITRAL VALVE REPAIR (MVR);  Surgeon: Rexene Alberts, MD;  Location: [place];  Service: Open Heart Surgery;  Laterality: Right;  . PROSTATE SURGERY  2000   seed implants  . TEE WITHOUT CARDIOVERSION N/A 11/10/2015   Procedure: TRANSESOPHAGEAL ECHOCARDIOGRAM (TEE);  Surgeon: Skeet Latch, MD;  Location: [place];   Service: Cardiovascular;  Laterality: N/A;  . TEE WITHOUT CARDIOVERSION N/A 01/04/2016   Procedure: TRANSESOPHAGEAL ECHOCARDIOGRAM (TEE);  Surgeon: Rexene Alberts, MD;  Location: [place];  Service: Open Heart Surgery;  Laterality: N/A;  . TOTAL KNEE ARTHROPLASTY Right 1955   Dr. Ethel Rana    No Known Allergies  Outpatient Encounter Medications as of 05/07/2018  Medication Sig  . alfuzosin (UROXATRAL) 10 MG 24 hr tablet Take 10 mg by mouth daily with breakfast. Take one daily to reduce bladder spasm  . aspirin 81 MG tablet Take 81 mg by mouth daily. Take one daily for anticoagulation  . augmented betamethasone dipropionate (DIPROLENE-AF) 0.05 % cream Apply 1 application topically daily.  Marland Kitchen losartan (COZAAR) 50 MG tablet Take 50 mg by mouth daily.   . metoprolol succinate (TOPROL-XL) 25 MG 24 hr tablet Take 12.5 mg by mouth daily.  . nabumetone (RELAFEN) 750 MG tablet TAKE 1 TABLET TWICE A DAY FOR SWELLING  . simvastatin (ZOCOR) 20 MG tablet TAKE 1 TABLET DAILY TO MAINTAIN LOW CHOLESTEROL  . TIROSINT 50 MCG CAPS TAKE 1 CAPSULE DAILY BEFORE BREAKFAST  . Triamcinolone Acetonide (TRIAMCINOLONE 0.1 % CREAM : EUCERIN) CREA Apply 1 application topically 2 (two) times daily.  . VESICARE 5 MG tablet Take 5 mg by mouth daily.   . [DISCONTINUED] mycophenolate (CELLCEPT) 500 MG tablet Take 1 tablet by mouth 2 (two) times daily.  . [DISCONTINUED] valACYclovir (VALTREX) 1000 MG tablet Take 1 tablet (1,000 mg total) by mouth every 8 (eight) hours.   No facility-administered encounter medications on file as of 05/07/2018.     Review of Systems:  Review of Systems  Constitutional: Positive for weight loss. Negative for chills, diaphoresis, fever and malaise/fatigue.  HENT: Negative for congestion.   Eyes: Negative for blurred vision.  Respiratory: Negative for cough and shortness of breath.   Cardiovascular: Negative for chest pain, palpitations and leg swelling.  Gastrointestinal: Negative for abdominal  pain, blood in stool, constipation, diarrhea and melena.  Genitourinary: Negative for dysuria, frequency and urgency.  Musculoskeletal: Negative for falls and joint pain.  Skin: Positive for itching and rash.  Neurological: Negative for dizziness.  Endo/Heme/Allergies: Bruises/bleeds easily.  Psychiatric/Behavioral: Negative for depression and memory loss. The patient is not nervous/anxious and does not have insomnia.        Caregiver stress    Health Maintenance  Topic Date Due  . INFLUENZA VACCINE  05/22/2018  . TETANUS/TDAP  02/27/2025  . PNA vac Low Risk Adult  Completed    Physical Exam: Vitals:   05/07/18 1319  BP: 122/60  Pulse: 78  SpO2: 96%  Weight: 158 lb (71.7 kg)  Height: 5\' 8"  (1.727 m)   Body mass index is 24.02 kg/m. Physical Exam  Constitutional: He is oriented to person, place, and time. He appears well-developed and well-nourished. No distress.  Cardiovascular: Normal rate, regular rhythm and intact distal pulses.  Murmur heard. Pulmonary/Chest: Effort normal and breath sounds normal. No respiratory distress.  Abdominal: Bowel sounds are normal.  Musculoskeletal: Normal range of motion.  Neurological: He is alert and oriented to person, place, and time. No cranial nerve deficit.  Skin: Skin is warm and dry.    Labs reviewed: Basic Metabolic Panel: Recent Labs    08/13/17 0600 04/29/18 0600  NA 135* 140  K 4.6 4.6  BUN 17 18  CREATININE 1.2 1.2  TSH 4.22 5.03   Liver Function Tests: Recent Labs    08/13/17 0600 04/29/18 0600  AST 26 29  ALT 20 25  ALKPHOS 82 80   No results for input(s): LIPASE, AMYLASE in the last 8760 hours. No results for input(s): AMMONIA in the last 8760 hours. CBC: Recent Labs    08/13/17 0600 04/29/18 0600  WBC 5.0 4.4  HGB 13.8 13.5  HCT 42 42  PLT 234 225   Lipid Panel: Recent Labs    08/13/17 0600 04/29/18 0600  CHOL 160 164  HDL 71* 75*  LDLCALC 70 73  TRIG 95 81   Lab Results  Component  Value Date   HGBA1C 5.5 01/02/2016    Assessment/Plan 1. Paroxysmal atrial fibrillation (HCC) -not active, cont toprol for rate control, asa for anticoagulation, had after surgery and then not since  2. Chronic aorto-iliac occlusion syndrome (HCC) -asymptomatic, noted by cardiology historically  3. Essential hypertension, benign -bp at goal with current regimen of toprol and cozaar  4. Hypothyroidism, unspecified type -cont tirosint, euthyroid  5. S/P minimally invasive mitral valve repair -has done well  6. Pure hypercholesterolemia -LDL right at goal with zocor, cont same  7. Dermatophytosis -cont triamcinolone cream and betamethasone cream per dermatology, Dr. Elvera Lennox, has had gradual improvement of his rash  8. Caregiver stress -his wife's dementia is progressing, he continues to be her primary caregiver, they are getting out to exercise class together   Labs/tests ordered:  No new Next appt:  3 mos with his wife   Kursten Kruk L. Castle Lamons, D.O. [facility] [address], [place] [address] Cell Phone (Mon-Fri 8am-5pm):  [phone] On Call:  [phone] & follow prompts after 5pm & weekends Office Phone:  [phone] Office Fax:  [phone]

## 2018-05-23 DIAGNOSIS — L270 Generalized skin eruption due to drugs and medicaments taken internally: Secondary | ICD-10-CM | POA: Diagnosis not present

## 2018-05-23 DIAGNOSIS — Z85828 Personal history of other malignant neoplasm of skin: Secondary | ICD-10-CM | POA: Diagnosis not present

## 2018-05-23 DIAGNOSIS — L820 Inflamed seborrheic keratosis: Secondary | ICD-10-CM | POA: Diagnosis not present

## 2018-06-25 DIAGNOSIS — Z85828 Personal history of other malignant neoplasm of skin: Secondary | ICD-10-CM | POA: Diagnosis not present

## 2018-06-25 DIAGNOSIS — L820 Inflamed seborrheic keratosis: Secondary | ICD-10-CM | POA: Diagnosis not present

## 2018-06-25 DIAGNOSIS — L270 Generalized skin eruption due to drugs and medicaments taken internally: Secondary | ICD-10-CM | POA: Diagnosis not present

## 2018-06-25 DIAGNOSIS — L821 Other seborrheic keratosis: Secondary | ICD-10-CM | POA: Diagnosis not present

## 2018-08-01 DIAGNOSIS — Z8546 Personal history of malignant neoplasm of prostate: Secondary | ICD-10-CM | POA: Diagnosis not present

## 2018-08-01 DIAGNOSIS — N401 Enlarged prostate with lower urinary tract symptoms: Secondary | ICD-10-CM | POA: Diagnosis not present

## 2018-08-01 DIAGNOSIS — R350 Frequency of micturition: Secondary | ICD-10-CM | POA: Diagnosis not present

## 2018-08-06 DIAGNOSIS — Z961 Presence of intraocular lens: Secondary | ICD-10-CM | POA: Diagnosis not present

## 2018-08-06 DIAGNOSIS — H26491 Other secondary cataract, right eye: Secondary | ICD-10-CM | POA: Diagnosis not present

## 2018-08-09 ENCOUNTER — Other Ambulatory Visit: Payer: Self-pay | Admitting: Internal Medicine

## 2018-08-13 ENCOUNTER — Non-Acute Institutional Stay: Payer: Medicare Other | Admitting: Internal Medicine

## 2018-08-13 ENCOUNTER — Encounter: Payer: Self-pay | Admitting: Internal Medicine

## 2018-08-13 VITALS — BP 110/60 | HR 69 | Temp 97.7°F | Ht 68.0 in | Wt 156.0 lb

## 2018-08-13 DIAGNOSIS — I480 Paroxysmal atrial fibrillation: Secondary | ICD-10-CM

## 2018-08-13 DIAGNOSIS — E7800 Pure hypercholesterolemia, unspecified: Secondary | ICD-10-CM | POA: Diagnosis not present

## 2018-08-13 DIAGNOSIS — I10 Essential (primary) hypertension: Secondary | ICD-10-CM

## 2018-08-13 DIAGNOSIS — Z636 Dependent relative needing care at home: Secondary | ICD-10-CM

## 2018-08-13 DIAGNOSIS — E039 Hypothyroidism, unspecified: Secondary | ICD-10-CM | POA: Diagnosis not present

## 2018-08-13 DIAGNOSIS — R609 Edema, unspecified: Secondary | ICD-10-CM

## 2018-08-13 NOTE — Progress Notes (Signed)
Location:  Occupational psychologist of Service:  Clinic (12)  Provider: Hansel Devan L. Mariea Clonts, D.O., C.M.D.  Goals of Care:  Advanced Directives 08/13/2018  Does Patient Have a Medical Advance Directive? Yes  Type of Paramedic of [place];Living will  Does patient want to make changes to medical advance directive? No - Patient declined  Copy of [place] in Chart? Yes   Chief Complaint  Patient presents with  . Medical Management of Chronic Issues    83mth follow-up    HPI: Patient is a 84 y.o. male seen today for medical management of chronic diseases.    Swelling of ankles--left worse than right.  No chest pain, sob, palpitations, anxiety.  He'd been on furosemide with Dr. Wynonia Lawman.  We discussed venous insufficiency since he has no other chf symptoms.  Feet do get cold at night.  Edema resolves at night.  He does limit his sodium intake.  He does love salt.  He does notice when he's eaten something salty.    He went to the urologist 2 weeks ago b/c he wanted a refill on his uroxatral.  The urologist said he could get his prescription from me.  He did take a urine sample and check a PSA.  2-3 days later, he got a message that his PSA was undetectable.  Their daughter is coming Thursday.    Has been keeping up with his exercise even with his wife's recent fall, hospitalization and current rehab stay.  Past Medical History:  Diagnosis Date  . Aortoiliac occlusive disease ([place])   . Carotid artery disease ([place])   . Complication of anesthesia 1955   sodium pentathol ?  Marland Kitchen Contracture of palmar fascia 06/21/2010  . Cortical senile cataract 06/27/2010  . Dysrhythmia   . Enthesopathy of hip region 11/27/2009  . Essential hypertension, benign 06/21/2010  . GERD (gastroesophageal reflux disease)    occ  . Hyperlipidemia LDL goal <100 06/21/2010  . Hypertrophy of prostate without urinary obstruction and other lower urinary tract symptoms  (LUTS) 06/21/2010  . Hypothyroidism 08/27/2011  . Malignant neoplasm of prostate ([place]) 06/21/2010  . Mitral regurgitation 10/07/2015   Severe MR noted on ECHO with partially flail post leaflet Oct 03 2015   . Nocturia 2011  . Osteoarthrosis, unspecified whether generalized or localized, unspecified site 06/21/2010  . Other and unspecified hyperlipidemia 06/21/2010  . Pain in joint, lower leg 02/25/2012  . Palpitations 08/14/2010  . Patent foramen ovale 01/04/2016   Closed at the time of mitral valve repair   . S/P minimally invasive mitral valve repair 01/04/2016   Complex valvuloplasty including triangular resection of flail segment of posterior leaflet, artificial Gore-tex neochord placement x6 and 60mm Sorin Memo 3D ring annuloplasty via right mini thoracotomy approach with closure of PFO and clipping of LA appendage   . Unspecified constipation 06/21/2010  . Unspecified essential hypertension 06/21/2010  . Unspecified hypothyroidism 08/27/2011  . Urinary frequency 08/14/2008    Past Surgical History:  Procedure Laterality Date  . CARDIAC CATHETERIZATION N/A 11/10/2015   Procedure: Right/Left Heart Cath and Coronary Angiography;  Surgeon: Peter M Martinique, MD;  Location: [place] [facility];  Service: Cardiovascular;  Laterality: N/A;  . CATARACT EXTRACTION W/ INTRAOCULAR LENS  IMPLANT, BILATERAL  2006  . CLIPPING OF ATRIAL APPENDAGE N/A 01/04/2016   Procedure: CLIPPING OF ATRIAL APPENDAGE;  Surgeon: Rexene Alberts, MD;  Location: [place];  Service: Open Heart Surgery;  Laterality: N/A;  .  KNEE ARTHROSCOPY WITH MENISCAL REPAIR Left 1968  . MITRAL VALVE REPAIR Right 01/04/2016   Procedure: MINIMALLY INVASIVE MITRAL VALVE REPAIR (MVR);  Surgeon: Rexene Alberts, MD;  Location: [place];  Service: Open Heart Surgery;  Laterality: Right;  . PROSTATE SURGERY  2000   seed implants  . TEE WITHOUT CARDIOVERSION N/A 11/10/2015   Procedure: TRANSESOPHAGEAL ECHOCARDIOGRAM (TEE);  Surgeon: Skeet Latch, MD;   Location: [place];  Service: Cardiovascular;  Laterality: N/A;  . TEE WITHOUT CARDIOVERSION N/A 01/04/2016   Procedure: TRANSESOPHAGEAL ECHOCARDIOGRAM (TEE);  Surgeon: Rexene Alberts, MD;  Location: [place];  Service: Open Heart Surgery;  Laterality: N/A;  . TOTAL KNEE ARTHROPLASTY Right 1955   Dr. Ethel Rana    No Known Allergies  Outpatient Encounter Medications as of 08/13/2018  Medication Sig  . alfuzosin (UROXATRAL) 10 MG 24 hr tablet Take 10 mg by mouth daily with breakfast. Take one daily to reduce bladder spasm  . aspirin 81 MG tablet Take 81 mg by mouth daily. Take one daily for anticoagulation  . augmented betamethasone dipropionate (DIPROLENE-AF) 0.05 % cream Apply 1 application topically daily.  Marland Kitchen losartan (COZAAR) 25 MG tablet   . metoprolol succinate (TOPROL-XL) 25 MG 24 hr tablet Take 12.5 mg by mouth daily.  . nabumetone (RELAFEN) 750 MG tablet TAKE 1 TABLET TWICE A DAY FOR SWELLING  . simvastatin (ZOCOR) 20 MG tablet TAKE 1 TABLET DAILY TO MAINTAIN LOW CHOLESTEROL  . TIROSINT 50 MCG CAPS TAKE 1 CAPSULE DAILY BEFORE BREAKFAST  . Triamcinolone Acetonide (TRIAMCINOLONE 0.1 % CREAM : EUCERIN) CREA Apply 1 application topically 2 (two) times daily.  . VESICARE 5 MG tablet Take 5 mg by mouth daily.   . [DISCONTINUED] losartan (COZAAR) 50 MG tablet Take 50 mg by mouth daily.    No facility-administered encounter medications on file as of 08/13/2018.     Review of Systems:  Review of Systems  Constitutional: Negative for chills, fever and malaise/fatigue.  HENT: Negative for congestion and hearing loss.   Eyes: Negative for blurred vision.  Respiratory: Negative for cough and shortness of breath.   Cardiovascular: Positive for leg swelling. Negative for chest pain, palpitations, orthopnea, claudication and PND.  Gastrointestinal: Negative for abdominal pain, blood in stool, constipation, diarrhea, heartburn and melena.  Genitourinary: Negative for dysuria, frequency and  urgency.  Musculoskeletal: Negative for falls and joint pain.  Skin: Negative for itching and rash.  Neurological: Negative for dizziness and loss of consciousness.  Endo/Heme/Allergies: Does not bruise/bleed easily.  Psychiatric/Behavioral: Negative for depression and memory loss. The patient is not nervous/anxious and does not have insomnia.        Caregiver stress    Health Maintenance  Topic Date Due  . INFLUENZA VACCINE  05/22/2018  . TETANUS/TDAP  02/27/2025  . PNA vac Low Risk Adult  Completed    Physical Exam: Vitals:   08/13/18 1043  BP: 110/60  Pulse: 69  Temp: 97.7 F (36.5 C)  TempSrc: Oral  SpO2: 97%  Weight: 156 lb (70.8 kg)  Height: 5\' 8"  (1.727 m)   Body mass index is 23.72 kg/m. Physical Exam  Constitutional: He is oriented to person, place, and time. He appears well-developed and well-nourished. No distress.  HENT:  Head: Normocephalic and atraumatic.  Eyes: Pupils are equal, round, and reactive to light. EOM are normal.  Cardiovascular: Normal rate, regular rhythm and intact distal pulses.  Murmur heard. 1+ pitting edema of ankles, left ankle larger than right  Pulmonary/Chest: Effort normal and  breath sounds normal. No respiratory distress.  Abdominal: Bowel sounds are normal.  Musculoskeletal: Normal range of motion.  Neurological: He is alert and oriented to person, place, and time.  Skin: Skin is warm and dry. Capillary refill takes less than 2 seconds.  Psychiatric: He has a normal mood and affect.    Labs reviewed: Basic Metabolic Panel: Recent Labs    04/29/18 04/29/18 0600  NA 140  --   K 4.6  --   BUN 18  --   CREATININE 1.2  --   TSH  --  5.03   Liver Function Tests: Recent Labs    04/29/18 0600  AST 29  ALT 25  ALKPHOS 80   No results for input(s): LIPASE, AMYLASE in the last 8760 hours. No results for input(s): AMMONIA in the last 8760 hours. CBC: Recent Labs    04/29/18  WBC 4.4  HGB 13.5  HCT 42  PLT 225    Lipid Panel: Recent Labs    04/29/18 0600  CHOL 164  HDL 75*  LDLCALC 73  TRIG 81   Lab Results  Component Value Date   HGBA1C 5.5 01/02/2016    Assessment/Plan 1. Pitting edema -has recurred -turns out he's eating quite a bit of sodium recently though he knows better -counseled on this, recommended elevating feet at rest and considering compression socks if this persists without other symptoms like shortness of breath, orthopnea, palpitations or PND  2. Paroxysmal atrial fibrillation (HCC) -occurred post-mitral surgery, regular today, rate controlled, continues on baby asa  3. Essential hypertension, benign -bp well conrolled, cont current regimen  4. Hypothyroidism, unspecified type Lab Results  Component Value Date   TSH 5.03 04/29/2018   Recheck before next visit  5. Pure hypercholesterolemia -has been controlled with zocor, cont same regimen, diet and exercise  6. Caregiver stress -ongoing and recently worse with his wife's fall with head trauma and hip pain (she's in rehab), her dementia has progressed  Labs/tests ordered:  Cbc, cmp, flp, tsh Next appt:  6 mos med mgt, fasting labs before  Kasim Mccorkle L. Maleta Pacha, D.O. [facility] [address], [place] [address] Cell Phone (Mon-Fri 8am-5pm):  [phone] On Call:  [phone] & follow prompts after 5pm & weekends Office Phone:  [phone] Office Fax:  [phone]

## 2018-08-25 DIAGNOSIS — Z85828 Personal history of other malignant neoplasm of skin: Secondary | ICD-10-CM | POA: Diagnosis not present

## 2018-08-25 DIAGNOSIS — L308 Other specified dermatitis: Secondary | ICD-10-CM | POA: Diagnosis not present

## 2018-09-16 ENCOUNTER — Other Ambulatory Visit: Payer: Self-pay | Admitting: Internal Medicine

## 2018-10-27 DIAGNOSIS — Z85828 Personal history of other malignant neoplasm of skin: Secondary | ICD-10-CM | POA: Diagnosis not present

## 2018-10-27 DIAGNOSIS — L309 Dermatitis, unspecified: Secondary | ICD-10-CM | POA: Diagnosis not present

## 2018-10-27 DIAGNOSIS — L570 Actinic keratosis: Secondary | ICD-10-CM | POA: Diagnosis not present

## 2018-11-20 DIAGNOSIS — L738 Other specified follicular disorders: Secondary | ICD-10-CM | POA: Diagnosis not present

## 2018-11-20 DIAGNOSIS — Z85828 Personal history of other malignant neoplasm of skin: Secondary | ICD-10-CM | POA: Diagnosis not present

## 2018-11-25 ENCOUNTER — Telehealth: Payer: Self-pay | Admitting: Cardiology

## 2018-11-25 MED ORDER — METOPROLOL SUCCINATE ER 25 MG PO TB24: 12.5000 mg | ORAL_TABLET | Freq: Every day | ORAL | 0 refills | Status: DC

## 2018-11-25 NOTE — Telephone Encounter (Signed)
Medication refilled for 90 days per Dr. Agustin Cree

## 2018-11-25 NOTE — Telephone Encounter (Signed)
Has been given to Dr. Agustin Cree for him to review

## 2018-11-25 NOTE — Telephone Encounter (Signed)
° °  1. Which medications need to be refilled? (please list name of each medication and dose if known) Toprol XL tabs 25mg  ER 1/2 tab per day  2. Which pharmacy/location (including street and city if local pharmacy) is medication to [facility] sent to?[facility]  3. Do they need a 30 day or 90 day supply? [place]

## 2019-01-15 DIAGNOSIS — L308 Other specified dermatitis: Secondary | ICD-10-CM | POA: Diagnosis not present

## 2019-01-15 DIAGNOSIS — Z85828 Personal history of other malignant neoplasm of skin: Secondary | ICD-10-CM | POA: Diagnosis not present

## 2019-01-20 ENCOUNTER — Other Ambulatory Visit: Payer: Self-pay | Admitting: Internal Medicine

## 2019-02-10 ENCOUNTER — Other Ambulatory Visit: Payer: Self-pay | Admitting: Cardiology

## 2019-02-10 DIAGNOSIS — E785 Hyperlipidemia, unspecified: Secondary | ICD-10-CM | POA: Diagnosis not present

## 2019-02-10 DIAGNOSIS — I10 Essential (primary) hypertension: Secondary | ICD-10-CM | POA: Diagnosis not present

## 2019-02-10 DIAGNOSIS — D649 Anemia, unspecified: Secondary | ICD-10-CM | POA: Diagnosis not present

## 2019-02-10 DIAGNOSIS — E039 Hypothyroidism, unspecified: Secondary | ICD-10-CM | POA: Diagnosis not present

## 2019-02-10 LAB — BASIC METABOLIC PANEL
BUN: 21 (ref 4–21)
Creatinine: 1.3 (ref 0.6–1.3)
Glucose: 92
Potassium: 4.4 (ref 3.4–5.3)
Sodium: 140 (ref 137–147)

## 2019-02-10 LAB — LIPID PANEL
Cholesterol: 151 (ref 0–200)
HDL: 73 — AB (ref 35–70)
LDL Cholesterol: 62
Triglycerides: 84 (ref 40–160)

## 2019-02-10 LAB — HEPATIC FUNCTION PANEL
ALT: 28 (ref 10–40)
AST: 36 (ref 14–40)
Alkaline Phosphatase: 81 (ref 25–125)
Bilirubin, Total: 0.3

## 2019-02-10 LAB — TSH: TSH: 4.72 (ref 0.41–5.90)

## 2019-02-10 LAB — CBC AND DIFFERENTIAL
HCT: 37 — AB (ref 41–53)
Hemoglobin: 12.9 — AB (ref 13.5–17.5)
Platelets: 218 (ref 150–399)
WBC: 4.5

## 2019-02-11 NOTE — Telephone Encounter (Signed)
Sent Toprol to [facility]

## 2019-02-12 ENCOUNTER — Encounter: Payer: Self-pay | Admitting: Internal Medicine

## 2019-02-18 ENCOUNTER — Encounter: Payer: Self-pay | Admitting: Internal Medicine

## 2019-02-18 ENCOUNTER — Other Ambulatory Visit: Payer: Self-pay

## 2019-02-18 ENCOUNTER — Non-Acute Institutional Stay: Payer: Medicare Other | Admitting: Internal Medicine

## 2019-02-18 VITALS — BP 120/60 | HR 67 | Temp 97.8°F | Ht 68.0 in | Wt 156.0 lb

## 2019-02-18 DIAGNOSIS — E039 Hypothyroidism, unspecified: Secondary | ICD-10-CM

## 2019-02-18 DIAGNOSIS — I10 Essential (primary) hypertension: Secondary | ICD-10-CM

## 2019-02-18 DIAGNOSIS — E7800 Pure hypercholesterolemia, unspecified: Secondary | ICD-10-CM

## 2019-02-18 DIAGNOSIS — R21 Rash and other nonspecific skin eruption: Secondary | ICD-10-CM | POA: Diagnosis not present

## 2019-02-18 DIAGNOSIS — I480 Paroxysmal atrial fibrillation: Secondary | ICD-10-CM | POA: Diagnosis not present

## 2019-02-18 DIAGNOSIS — R609 Edema, unspecified: Secondary | ICD-10-CM

## 2019-02-18 DIAGNOSIS — Z636 Dependent relative needing care at home: Secondary | ICD-10-CM

## 2019-02-18 NOTE — Progress Notes (Signed)
Location:  Occupational psychologist of Service:  Clinic (12)  Provider: Jenille Laszlo L. Mariea Clonts, D.O., C.M.D.  Goals of Care:  Advanced Directives 02/18/2019  Does Patient Have a Medical Advance Directive? Yes  Type of Paramedic of [name];Living will  Does patient want to make changes to medical advance directive? No - Patient declined  Copy of [place] in Chart? Yes - validated most recent copy scanned in chart (See row information)   Chief Complaint  Patient presents with  . Medical Management of Chronic Issues    4mth follow-up    HPI: Patient is a 85 y.o. male seen today for medical management of chronic diseases.    Back rash persists.  It is driving him crazy.  He's had some degree of rash since his shingrix vaccines.  He's been to Dr. Durward Fortes several times, myself several times.  He's been treated as if he's had shingles, treated with steroids and antifungals to no full relief.  He's now on doxycycline for it.  He notes that he's now gotten some photosensitivity rash from his doxycycline.    He notes [name] is still gradually declining.  She has zero memory and totally unaware of her surroundings.  She claims to read the newspaper but does not retain any of it and he thinks she does not even read it.  Sleeps 20 hrs a day.  They're eating dinner at home.    Swollen ankles--big time.  Will stay with him for weeks, then go down.  He has not noted any irregular heart beat, but he does not feel his heart bear overall.  No fatigue, dizziness.  Still gets out to exercise--walks around campus himself and then the two of them go all the way around in the evening.     Hypothyroidism:  No problems with missing doses of his tirosint.  Takes it before eating each am when he first gets up.  He says it's probably only 15 mins before breakfast.  Past Medical History:  Diagnosis Date  . Aortoiliac occlusive disease ([place])   . Carotid  artery disease ([place])   . Complication of anesthesia 1955   sodium pentathol ?  Marland Kitchen Contracture of palmar fascia 06/21/2010  . Cortical senile cataract 06/27/2010  . Dysrhythmia   . Enthesopathy of hip region 11/27/2009  . Essential hypertension, benign 06/21/2010  . GERD (gastroesophageal reflux disease)    occ  . Hyperlipidemia LDL goal <100 06/21/2010  . Hypertrophy of prostate without urinary obstruction and other lower urinary tract symptoms (LUTS) 06/21/2010  . Hypothyroidism 08/27/2011  . Malignant neoplasm of prostate ([place]) 06/21/2010  . Mitral regurgitation 10/07/2015   Severe MR noted on ECHO with partially flail post leaflet Oct 03 2015   . Nocturia 2011  . Osteoarthrosis, unspecified whether generalized or localized, unspecified site 06/21/2010  . Other and unspecified hyperlipidemia 06/21/2010  . Pain in joint, lower leg 02/25/2012  . Palpitations 08/14/2010  . Patent foramen ovale 01/04/2016   Closed at the time of mitral valve repair   . S/P minimally invasive mitral valve repair 01/04/2016   Complex valvuloplasty including triangular resection of flail segment of posterior leaflet, artificial Gore-tex neochord placement x6 and 70mm Sorin Memo 3D ring annuloplasty via right mini thoracotomy approach with closure of PFO and clipping of LA appendage   . Unspecified constipation 06/21/2010  . Unspecified essential hypertension 06/21/2010  . Unspecified hypothyroidism 08/27/2011  . Urinary frequency 08/14/2008  Past Surgical History:  Procedure Laterality Date  . CARDIAC CATHETERIZATION N/A 11/10/2015   Procedure: Right/Left Heart Cath and Coronary Angiography;  Surgeon: Peter M Martinique, MD;  Location: [facility];  Service: Cardiovascular;  Laterality: N/A;  . CATARACT EXTRACTION W/ INTRAOCULAR LENS  IMPLANT, BILATERAL  2006  . CLIPPING OF ATRIAL APPENDAGE N/A 01/04/2016   Procedure: CLIPPING OF ATRIAL APPENDAGE;  Surgeon: Rexene Alberts, MD;  Location: [facility];  Service: Open Heart  Surgery;  Laterality: N/A;  . KNEE ARTHROSCOPY WITH MENISCAL REPAIR Left 1968  . MITRAL VALVE REPAIR Right 01/04/2016   Procedure: MINIMALLY INVASIVE MITRAL VALVE REPAIR (MVR);  Surgeon: Rexene Alberts, MD;  Location: [facility];  Service: Open Heart Surgery;  Laterality: Right;  . PROSTATE SURGERY  2000   seed implants  . TEE WITHOUT CARDIOVERSION N/A 11/10/2015   Procedure: TRANSESOPHAGEAL ECHOCARDIOGRAM (TEE);  Surgeon: Skeet Latch, MD;  Location: [place] [facility];  Service: Cardiovascular;  Laterality: N/A;  . TEE WITHOUT CARDIOVERSION N/A 01/04/2016   Procedure: TRANSESOPHAGEAL ECHOCARDIOGRAM (TEE);  Surgeon: Rexene Alberts, MD;  Location: [facility];  Service: Open Heart Surgery;  Laterality: N/A;  . TOTAL KNEE ARTHROPLASTY Right 1955   Dr. Ethel Rana    No Known Allergies  Outpatient Encounter Medications as of 02/18/2019  Medication Sig  . alfuzosin (UROXATRAL) 10 MG 24 hr tablet Take 10 mg by mouth daily with breakfast. Take one daily to reduce bladder spasm  . aspirin 81 MG tablet Take 81 mg by mouth daily. Take one daily for anticoagulation  . augmented betamethasone dipropionate (DIPROLENE-AF) 0.05 % cream Apply 1 application topically daily.  Marland Kitchen doxycycline (VIBRA-TABS) 100 MG tablet Take 100 mg by mouth 2 (two) times daily.   . fluocinonide (LIDEX) 0.05 % external solution Apply 1 application topically 2 (two) times daily.   Marland Kitchen losartan (COZAAR) 25 MG tablet Take 25 mg by mouth daily.   . nabumetone (RELAFEN) 750 MG tablet TAKE 1 TABLET TWICE A DAY FOR SWELLING  . simvastatin (ZOCOR) 20 MG tablet TAKE 1 TABLET DAILY TO MAINTAIN LOW CHOLESTEROL  . TIROSINT 50 MCG CAPS TAKE 1 CAPSULE DAILY BEFORE BREAKFAST  . TOPROL XL 25 MG 24 hr tablet TAKE ONE-HALF (1/2) TABLET DAILY  . Triamcinolone Acetonide (TRIAMCINOLONE 0.1 % CREAM : EUCERIN) CREA Apply 1 application topically 2 (two) times daily.  . VESICARE 5 MG tablet Take 5 mg by mouth daily.    No facility-administered encounter medications on  file as of 02/18/2019.     Review of Systems:  Review of Systems  Constitutional: Negative for chills, fever, malaise/fatigue and weight loss.  HENT: Positive for hearing loss.   Eyes: Negative for blurred vision.  Respiratory: Negative for cough and shortness of breath.   Cardiovascular: Positive for leg swelling. Negative for chest pain, palpitations, orthopnea, claudication and PND.  Gastrointestinal: Negative for abdominal pain, blood in stool, constipation, diarrhea and melena.  Genitourinary: Negative for dysuria.  Musculoskeletal: Negative for falls and joint pain.  Skin: Positive for itching and rash.  Neurological: Negative for dizziness and loss of consciousness.  Endo/Heme/Allergies: Bruises/bleeds easily.  Psychiatric/Behavioral: Negative for depression and memory loss. The patient is not nervous/anxious and does not have insomnia.        Caregiver stress    Health Maintenance  Topic Date Due  . INFLUENZA VACCINE  05/23/2019  . TETANUS/TDAP  02/27/2025  . PNA vac Low Risk Adult  Completed    Physical Exam: Vitals:   02/18/19 1325  BP: 120/60  Pulse: 67  Temp: 97.8 F (36.6 C)  TempSrc: Oral  SpO2: 98%  Weight: 156 lb (70.8 kg)  Height: 5\' 8"  (1.727 m)   Body mass index is 23.72 kg/m. Physical Exam Constitutional:      General: He is not in acute distress.    Appearance: Normal appearance. He is normal weight. He is not ill-appearing or toxic-appearing.  HENT:     Head: Normocephalic and atraumatic.  Cardiovascular:     Rate and Rhythm: Normal rate and regular rhythm.     Pulses: Normal pulses.     Heart sounds: Normal heart sounds.     Comments: Pitting edema bilaterally Pulmonary:     Effort: Pulmonary effort is normal.     Breath sounds: Normal breath sounds.  Musculoskeletal: Normal range of motion.  Skin:    General: Skin is warm and dry.     Comments: Erythematous patches over his back with some papules; evidence of excoriation; arms with  small purple ecchymoses  Neurological:     General: No focal deficit present.     Mental Status: He is alert and oriented to person, place, and time.  Psychiatric:        Mood and Affect: Mood normal.     Labs reviewed: Basic Metabolic Panel: Recent Labs    04/29/18 04/29/18 0600 02/10/19 0800  NA 140  --  140  K 4.6  --  4.4  BUN 18  --  21  CREATININE 1.2  --  1.3  TSH  --  5.03 4.72   Liver Function Tests: Recent Labs    04/29/18 0600 02/10/19 0800  AST 29 36  ALT 25 28  ALKPHOS 80 81   No results for input(s): LIPASE, AMYLASE in the last 8760 hours. No results for input(s): AMMONIA in the last 8760 hours. CBC: Recent Labs    04/29/18 02/10/19 0800  WBC 4.4 4.5  HGB 13.5 12.9*  HCT 42 37*  PLT 225 218   Lipid Panel: Recent Labs    04/29/18 0600 02/10/19 0800  CHOL 164 151  HDL 75* 73*  LDLCALC 73 62  TRIG 81 84   Lab Results  Component Value Date   HGBA1C 5.5 01/02/2016    Assessment/Plan 1. Rash of back - see hpi above and several previous notes about this -previously saw Dr. Durward Fortes and now wants to go to [facility] (did not keep previous appt due to wife's health and some improvement before) - Ambulatory referral to Dermatology  2. Pitting edema -worse since meals are less resident-specific amid covid-19 and he admits to daily soup intake -he is now aware that this may be the culprit so he's doing to avoid soup for 3 days to see if it helps -continue to elevate feet at rest -may need compression hose if not better -weight stable   3. Paroxysmal atrial fibrillation (HCC) -today rhythm and rate are regular -he does not note any symptoms of palpitations, chest pain or sob  4. Essential hypertension, benign -bp well controlled, cont same regimen  5. Caregiver stress -continues as wife is back home with him and her memory continues to decline -they are walking regularly together  6. Hypothyroidism, unspecified type -cont current  thyroid medication, but he was taking it 15 mins before breakfast so advised to take it earlier  7. Pure hypercholesterolemia -cont current therapy, be careful with changed diet and continue walking since he can't swim now with covid-19 pandemic  Labs/tests  ordered:  Will need to recheck TSH again next time (not ordered)  Next appt:  06/17/2019  Tanis Hensarling L. Kaesen Rodriguez, D.O. [facility] [address], [place] [address] Cell Phone (Mon-Fri 8am-5pm):  [phone] On Call:  [phone] & follow prompts after 5pm & weekends Office Phone:  [phone] Office Fax:  [phone]

## 2019-03-10 DIAGNOSIS — L821 Other seborrheic keratosis: Secondary | ICD-10-CM | POA: Diagnosis not present

## 2019-03-10 DIAGNOSIS — Z87891 Personal history of nicotine dependence: Secondary | ICD-10-CM | POA: Diagnosis not present

## 2019-03-10 DIAGNOSIS — R21 Rash and other nonspecific skin eruption: Secondary | ICD-10-CM | POA: Diagnosis not present

## 2019-03-10 DIAGNOSIS — L299 Pruritus, unspecified: Secondary | ICD-10-CM | POA: Diagnosis not present

## 2019-03-10 DIAGNOSIS — Z808 Family history of malignant neoplasm of other organs or systems: Secondary | ICD-10-CM | POA: Diagnosis not present

## 2019-03-14 ENCOUNTER — Other Ambulatory Visit: Payer: Self-pay | Admitting: Internal Medicine

## 2019-03-18 ENCOUNTER — Other Ambulatory Visit: Payer: Self-pay | Admitting: *Deleted

## 2019-03-18 DIAGNOSIS — R609 Edema, unspecified: Secondary | ICD-10-CM

## 2019-03-18 MED ORDER — FUROSEMIDE 20 MG PO TABS: 20.0000 mg | ORAL_TABLET | Freq: Every day | ORAL | 0 refills | Status: DC

## 2019-04-16 DIAGNOSIS — L209 Atopic dermatitis, unspecified: Secondary | ICD-10-CM | POA: Diagnosis not present

## 2019-04-16 DIAGNOSIS — Z5181 Encounter for therapeutic drug level monitoring: Secondary | ICD-10-CM | POA: Diagnosis not present

## 2019-05-14 DIAGNOSIS — L209 Atopic dermatitis, unspecified: Secondary | ICD-10-CM | POA: Diagnosis not present

## 2019-05-14 DIAGNOSIS — L299 Pruritus, unspecified: Secondary | ICD-10-CM | POA: Diagnosis not present

## 2019-05-14 DIAGNOSIS — Z5181 Encounter for therapeutic drug level monitoring: Secondary | ICD-10-CM | POA: Diagnosis not present

## 2019-06-17 ENCOUNTER — Non-Acute Institutional Stay: Payer: Medicare Other | Admitting: Internal Medicine

## 2019-06-17 ENCOUNTER — Encounter: Payer: Self-pay | Admitting: Internal Medicine

## 2019-06-17 ENCOUNTER — Other Ambulatory Visit: Payer: Self-pay

## 2019-06-17 VITALS — BP 120/80 | HR 107 | Temp 97.5°F | Ht 68.0 in | Wt 150.0 lb

## 2019-06-17 DIAGNOSIS — E785 Hyperlipidemia, unspecified: Secondary | ICD-10-CM

## 2019-06-17 DIAGNOSIS — D649 Anemia, unspecified: Secondary | ICD-10-CM

## 2019-06-17 DIAGNOSIS — Z636 Dependent relative needing care at home: Secondary | ICD-10-CM | POA: Diagnosis not present

## 2019-06-17 DIAGNOSIS — L209 Atopic dermatitis, unspecified: Secondary | ICD-10-CM

## 2019-06-17 DIAGNOSIS — I480 Paroxysmal atrial fibrillation: Secondary | ICD-10-CM | POA: Diagnosis not present

## 2019-06-17 DIAGNOSIS — I7409 Other arterial embolism and thrombosis of abdominal aorta: Secondary | ICD-10-CM | POA: Diagnosis not present

## 2019-06-17 DIAGNOSIS — E039 Hypothyroidism, unspecified: Secondary | ICD-10-CM

## 2019-06-17 NOTE — Progress Notes (Signed)
Location:  Occupational psychologist of Service:  Clinic (12)  Provider: Abdurahman Rugg L. Mariea Clonts, D.O., C.M.D.  Goals of Care:  Advanced Directives 02/18/2019  Does Patient Have a Medical Advance Directive? Yes  Type of Paramedic of [place];Living will  Does patient want to make changes to medical advance directive? No - Patient declined  Copy of [place] in Chart? Yes - validated most recent copy scanned in chart (See row information)     Chief Complaint  Patient presents with  . Medical Management of Chronic Issues    62mth follow-up    HPI: Patient is a 85 y.o. male seen today for medical management of chronic diseases.    He has atopic dermatitis as his final diagnosis for his itchy skin that initially began after his shingrix vaccine.  Going to [facility] for this.  He has one more appt before labor day.  He's on weekly methotrexate--had been 2 per week, now one per week.  He feels like it's sort of coming back.  He is taking folic acid regularly.    HR elevated this morning upon arrival.  Did improve by end of visit and he was having some pvcs on exam, but not consistently irregular.  He's asymptomatic.    His ankles do still swell at times.  Left is a little worse than right.  Right leg feels different than left ever since catheter procedures with mitral valve surgery.  Has chronic aortoiliac occlusion syndrome and we reviewed that today and it began to sound familiar.    He is back to swimming three times a week and he walks around the campus twice with his wife each evening  Labs at [facility] showed mild anemia in 12 range.  We discussed this and potential causes.  We opted to recheck.  Past Medical History:  Diagnosis Date  . Aortoiliac occlusive disease ([place])   . Carotid artery disease ([place])   . Complication of anesthesia 1955   sodium pentathol ?  Marland Kitchen Contracture of palmar fascia 06/21/2010  . Cortical  senile cataract 06/27/2010  . Dysrhythmia   . Enthesopathy of hip region 11/27/2009  . Essential hypertension, benign 06/21/2010  . GERD (gastroesophageal reflux disease)    occ  . Hyperlipidemia LDL goal <100 06/21/2010  . Hypertrophy of prostate without urinary obstruction and other lower urinary tract symptoms (LUTS) 06/21/2010  . Hypothyroidism 08/27/2011  . Malignant neoplasm of prostate ([place]) 06/21/2010  . Mitral regurgitation 10/07/2015   Severe MR noted on ECHO with partially flail post leaflet Oct 03 2015   . Nocturia 2011  . Osteoarthrosis, unspecified whether generalized or localized, unspecified site 06/21/2010  . Other and unspecified hyperlipidemia 06/21/2010  . Pain in joint, lower leg 02/25/2012  . Palpitations 08/14/2010  . Patent foramen ovale 01/04/2016   Closed at the time of mitral valve repair   . S/P minimally invasive mitral valve repair 01/04/2016   Complex valvuloplasty including triangular resection of flail segment of posterior leaflet, artificial Gore-tex neochord placement x6 and 65mm Sorin Memo 3D ring annuloplasty via right mini thoracotomy approach with closure of PFO and clipping of LA appendage   . Unspecified constipation 06/21/2010  . Unspecified essential hypertension 06/21/2010  . Unspecified hypothyroidism 08/27/2011  . Urinary frequency 08/14/2008    Past Surgical History:  Procedure Laterality Date  . CARDIAC CATHETERIZATION N/A 11/10/2015   Procedure: Right/Left Heart Cath and Coronary Angiography;  Surgeon: Peter M Martinique,  MD;  Location: [facility];  Service: Cardiovascular;  Laterality: N/A;  . CATARACT EXTRACTION W/ INTRAOCULAR LENS  IMPLANT, BILATERAL  2006  . CLIPPING OF ATRIAL APPENDAGE N/A 01/04/2016   Procedure: CLIPPING OF ATRIAL APPENDAGE;  Surgeon: Rexene Alberts, MD;  Location: [facility];  Service: Open Heart Surgery;  Laterality: N/A;  . KNEE ARTHROSCOPY WITH MENISCAL REPAIR Left 1968  . MITRAL VALVE REPAIR Right 01/04/2016   Procedure:  MINIMALLY INVASIVE MITRAL VALVE REPAIR (MVR);  Surgeon: Rexene Alberts, MD;  Location: [facility];  Service: Open Heart Surgery;  Laterality: Right;  . PROSTATE SURGERY  2000   seed implants  . TEE WITHOUT CARDIOVERSION N/A 11/10/2015   Procedure: TRANSESOPHAGEAL ECHOCARDIOGRAM (TEE);  Surgeon: Skeet Latch, MD;  Location: [facility];  Service: Cardiovascular;  Laterality: N/A;  . TEE WITHOUT CARDIOVERSION N/A 01/04/2016   Procedure: TRANSESOPHAGEAL ECHOCARDIOGRAM (TEE);  Surgeon: Rexene Alberts, MD;  Location: [facility];  Service: Open Heart Surgery;  Laterality: N/A;  . TOTAL KNEE ARTHROPLASTY Right 1955   Dr. Ethel Rana    No Known Allergies  Outpatient Encounter Medications as of 06/17/2019  Medication Sig  . alfuzosin (UROXATRAL) 10 MG 24 hr tablet Take 10 mg by mouth daily with breakfast. Take one daily to reduce bladder spasm  . aspirin 81 MG tablet Take 81 mg by mouth daily. Take one daily for anticoagulation  . augmented betamethasone dipropionate (DIPROLENE-AF) 0.05 % cream Apply 1 application topically daily.  . fluocinonide (LIDEX) 0.05 % external solution Apply 1 application topically 2 (two) times daily.   . folic acid (FOLVITE) 1 MG tablet Take 1 tablet by mouth daily.  Marland Kitchen losartan (COZAAR) 25 MG tablet Take 25 mg by mouth daily.   . methotrexate (RHEUMATREX) 2.5 MG tablet Take 1 tablet by mouth once a week.  . nabumetone (RELAFEN) 750 MG tablet TAKE 1 TABLET TWICE A DAY FOR SWELLING  . simvastatin (ZOCOR) 20 MG tablet TAKE 1 TABLET DAILY TO MAINTAIN LOW CHOLESTEROL  . TIROSINT 50 MCG CAPS TAKE 1 CAPSULE DAILY BEFORE BREAKFAST  . TOPROL XL 25 MG 24 hr tablet TAKE ONE-HALF (1/2) TABLET DAILY  . Triamcinolone Acetonide (TRIAMCINOLONE 0.1 % CREAM : EUCERIN) CREA Apply 1 application topically 2 (two) times daily.  . VESICARE 5 MG tablet Take 5 mg by mouth daily.   . [DISCONTINUED] doxycycline (VIBRA-TABS) 100 MG tablet Take 100 mg by mouth 2 (two) times daily.   . [DISCONTINUED]  furosemide (LASIX) 20 MG tablet Take 1 tablet (20 mg total) by mouth daily.   No facility-administered encounter medications on file as of 06/17/2019.     Review of Systems:  Review of Systems  Constitutional: Negative for chills, fever and malaise/fatigue.  HENT: Positive for hearing loss. Negative for congestion.   Eyes: Negative for blurred vision.  Respiratory: Negative for cough and shortness of breath.   Cardiovascular: Positive for leg swelling. Negative for chest pain, palpitations, orthopnea, claudication and PND.  Gastrointestinal: Negative for abdominal pain, blood in stool, constipation, diarrhea and melena.  Genitourinary: Negative for dysuria.  Musculoskeletal: Negative for falls and joint pain.  Skin: Negative for itching and rash (better).  Neurological: Negative for dizziness and loss of consciousness.  Endo/Heme/Allergies: Bruises/bleeds easily.  Psychiatric/Behavioral: Negative for depression and memory loss. The patient is not nervous/anxious and does not have insomnia.        Is forgetting some things lately and I note he's made some errors with his wife's medications    Health  Maintenance  Topic Date Due  . INFLUENZA VACCINE  05/23/2019  . TETANUS/TDAP  02/27/2025  . PNA vac Low Risk Adult  Completed    Physical Exam: Vitals:   06/17/19 1140  BP: 120/80  Pulse: (!) 107  Temp: (!) 97.5 F (36.4 C)  TempSrc: Oral  SpO2: 96%  Weight: 150 lb (68 kg)  Height: 5\' 8"  (1.727 m)   Body mass index is 22.81 kg/m. Physical Exam Vitals signs reviewed.  Constitutional:      Appearance: Normal appearance. He is normal weight.  HENT:     Head: Normocephalic and atraumatic.     Ears:     Comments: Some hearing loss Eyes:     Extraocular Movements: Extraocular movements intact.     Pupils: Pupils are equal, round, and reactive to light.  Cardiovascular:     Heart sounds: No murmur.     Comments: Regular with occasional skipped beat Pulmonary:     Effort:  Pulmonary effort is normal. No respiratory distress.     Breath sounds: Normal breath sounds. No stridor. No wheezing, rhonchi or rales.  Abdominal:     General: Bowel sounds are normal.  Musculoskeletal: Normal range of motion.     Comments: Very minimal edema at present  Skin:    General: Skin is warm and dry.     Capillary Refill: Capillary refill takes less than 2 seconds.  Neurological:     General: No focal deficit present.     Mental Status: He is alert and oriented to person, place, and time. Mental status is at baseline.     Cranial Nerves: No cranial nerve deficit.     Motor: No weakness.     Gait: Gait normal.  Psychiatric:        Mood and Affect: Mood normal.     Labs reviewed: Basic Metabolic Panel: Recent Labs    02/10/19 02/10/19 0800  NA 140  --   K 4.4  --   BUN 21  --   CREATININE 1.3  --   TSH  --  4.72   Liver Function Tests: Recent Labs    02/10/19 0800  AST 36  ALT 28  ALKPHOS 81   No results for input(s): LIPASE, AMYLASE in the last 8760 hours. No results for input(s): AMMONIA in the last 8760 hours. CBC: Recent Labs    02/10/19  WBC 4.5  HGB 12.9*  HCT 37*  PLT 218   Lipid Panel: Recent Labs    02/10/19  CHOL 151  HDL 73*  LDLCALC 62  TRIG 84   Lab Results  Component Value Date   HGBA1C 5.5 01/02/2016     Assessment/Plan 1. Chronic aorto-iliac occlusion syndrome (HCC) -educated on condition -cont exercise, bp, lipid control -explains some of his difficulty with edema -reviewed prior US that he had before his mitral valve repair  2. Paroxysmal atrial fibrillation (HCC) -continues on asa -did not seem to be in afib today at least not by the time I saw him and listened to him--HR had slowed down and mostly regular -is only on baby asa -had this postop -continues on toprol xl -starting to wonder if he needs a heart rate monitor to check on this -will copy cardiology   3. Hypothyroidism, unspecified type -cont tirosint  Lab Results  Component Value Date   TSH 4.72 02/10/2019   4. Caregiver stress -may be affecting pulse for appts due to concerns his wife will require more care than he  can provide and he wants to keep her at home -I'm becoming a bit more concerned about med mgt and we may need to suggest [facility] nurse[facility] med [facility] due to some errors noted  5. Hyperlipidemia LDL goal <100 -cont zocor therapy for lipid control and regular exercise -last LDL was at goal though lipitor or crestor would be recommended with his known arterial disease Lab Results  Component Value Date   CHOL 151 02/10/2019   HDL 73 (A) 02/10/2019   LDLCALC 62 02/10/2019   TRIG 84 02/10/2019    6. Atopic dermatitis, unspecified type -continue methotrexate and folic acid and keep derm f/u  7. Anemia, unspecified type -mild, may be due to methotrexate effect, no bleeding known, melena or hematochezia, will f/u cbc since last was several months ago  Labs/tests ordered:  Cbc next week, then full panel before 6 mos f/u Next appt:  6 mos  Navdeep Halt L. Danaysia Rader, D.O. [facility] [address],  [address] Cell Phone (Mon-Fri 8am-5pm):  [phone] On Call:  [phone] & follow prompts after 5pm & weekends Office Phone:  [phone] Office Fax:  [phone]

## 2019-06-23 DIAGNOSIS — I10 Essential (primary) hypertension: Secondary | ICD-10-CM | POA: Diagnosis not present

## 2019-06-23 DIAGNOSIS — D649 Anemia, unspecified: Secondary | ICD-10-CM | POA: Diagnosis not present

## 2019-06-23 DIAGNOSIS — I480 Paroxysmal atrial fibrillation: Secondary | ICD-10-CM | POA: Diagnosis not present

## 2019-06-23 LAB — CBC AND DIFFERENTIAL
HCT: 36 — AB (ref 41–53)
Hemoglobin: 12.3 — AB (ref 13.5–17.5)
Platelets: 213 (ref 150–399)
WBC: 3.6

## 2019-06-23 LAB — IRON,TIBC AND FERRITIN PANEL: Iron: 120

## 2019-06-24 ENCOUNTER — Other Ambulatory Visit: Payer: Self-pay | Admitting: *Deleted

## 2019-06-24 MED ORDER — NABUMETONE 750 MG PO TABS: 750.0000 mg | ORAL_TABLET | Freq: Two times a day (BID) | ORAL | 1 refills | Status: DC

## 2019-06-25 ENCOUNTER — Encounter: Payer: Self-pay | Admitting: Internal Medicine

## 2019-06-25 DIAGNOSIS — Z5181 Encounter for therapeutic drug level monitoring: Secondary | ICD-10-CM | POA: Diagnosis not present

## 2019-06-25 DIAGNOSIS — B078 Other viral warts: Secondary | ICD-10-CM | POA: Diagnosis not present

## 2019-06-25 DIAGNOSIS — L209 Atopic dermatitis, unspecified: Secondary | ICD-10-CM | POA: Insufficient documentation

## 2019-07-18 ENCOUNTER — Other Ambulatory Visit: Payer: Self-pay | Admitting: Cardiology

## 2019-07-20 NOTE — Telephone Encounter (Signed)
Please advise if OK to refill. Patient is scheduled to see Dr. Candee Furbish on 08/03/2019.

## 2019-07-20 NOTE — Telephone Encounter (Signed)
Refill sent to [facility].  

## 2019-07-31 DIAGNOSIS — Z23 Encounter for immunization: Secondary | ICD-10-CM | POA: Diagnosis not present

## 2019-08-03 ENCOUNTER — Encounter: Payer: Self-pay | Admitting: Cardiology

## 2019-08-03 ENCOUNTER — Ambulatory Visit (INDEPENDENT_AMBULATORY_CARE_PROVIDER_SITE_OTHER): Payer: Medicare Other | Admitting: Cardiology

## 2019-08-03 ENCOUNTER — Other Ambulatory Visit: Payer: Self-pay

## 2019-08-03 VITALS — BP 120/62 | HR 63 | Ht 68.0 in | Wt 150.6 lb

## 2019-08-03 DIAGNOSIS — I480 Paroxysmal atrial fibrillation: Secondary | ICD-10-CM

## 2019-08-03 DIAGNOSIS — I444 Left anterior fascicular block: Secondary | ICD-10-CM | POA: Diagnosis not present

## 2019-08-03 DIAGNOSIS — I10 Essential (primary) hypertension: Secondary | ICD-10-CM | POA: Diagnosis not present

## 2019-08-03 DIAGNOSIS — Z9889 Other specified postprocedural states: Secondary | ICD-10-CM | POA: Diagnosis not present

## 2019-08-03 MED ORDER — AMOXICILLIN 500 MG PO TABS: 2000.0000 mg | ORAL_TABLET | Freq: Once | ORAL | 3 refills | Status: AC

## 2019-08-03 NOTE — Progress Notes (Signed)
Cardiology Office Note:    Date:  08/03/2019   ID:  Samuel [name], DOB Sep 14, 1932, MRN [id number]  PCP:  Gayland Curry, DO  Cardiologist:  Candee Furbish, MD  Electrophysiologist:  None   Referring MD: Gayland Curry, DO     History of Present Illness:    Samuel [name] is a 85 y.o. male former patient of Dr. Judithann Sheen with mitral valve repair, clipping of left atrial appendage, small PFO closure 01/04/2016 paroxysmal atrial fibrillation GERD carotid artery disease history of prostate cancer and hyperlipidemia here to establish care.  Never really felt difference in his symptoms before or after repair.  Denies any angina fevers chills nausea vomiting claudication.  Had struggled with a rash previously.  Echo shows improvement in LV function.  Felt some lightheadedness with low blood pressure.  Overall he is feeling quite well.  No fever chills nausea vomiting syncope bleeding.  No shortness of breath.  We take care of his wife [name], has dementia.  He is still quite active, swimming.  No shortness of breath with activity.  Catheterization prior to surgery showed no significant CAD.  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 Aortoiliac occlusive disease ([place])   . Carotid artery disease ([place])   . Complication of anesthesia 1955   sodium pentathol ?  Marland Kitchen Contracture of palmar fascia 06/21/2010  . Cortical senile cataract 06/27/2010  . Dysrhythmia   . Enthesopathy of hip region 11/27/2009  . Essential hypertension, benign 06/21/2010  . GERD (gastroesophageal reflux disease)    occ  . Hyperlipidemia LDL goal <100 06/21/2010  . Hypertrophy of prostate without urinary obstruction and other lower urinary tract symptoms (LUTS) 06/21/2010  . Hypothyroidism 08/27/2011  . Malignant neoplasm of prostate ([place]) 06/21/2010  . Mitral regurgitation 10/07/2015   Severe MR noted on ECHO with partially flail post leaflet Oct 03 2015   . Nocturia  2011  . Osteoarthrosis, unspecified whether generalized or localized, unspecified site 06/21/2010  . Other and unspecified hyperlipidemia 06/21/2010  . Pain in joint, lower leg 02/25/2012  . Palpitations 08/14/2010  . Patent foramen ovale 01/04/2016   Closed at the time of mitral valve repair   . S/P minimally invasive mitral valve repair 01/04/2016   Complex valvuloplasty including triangular resection of flail segment of posterior leaflet, artificial Gore-tex neochord placement x6 and 13mm Sorin Memo 3D ring annuloplasty via right mini thoracotomy approach with closure of PFO and clipping of LA appendage   . Unspecified constipation 06/21/2010  . Unspecified essential hypertension 06/21/2010  . Unspecified hypothyroidism 08/27/2011  . Urinary frequency 08/14/2008    Past Surgical History:  Procedure Laterality Date  . CARDIAC CATHETERIZATION N/A 11/10/2015   Procedure: Right/Left Heart Cath and Coronary Angiography;  Surgeon: Peter M Martinique, MD;  Location: [place] [facility];  Service: Cardiovascular;  Laterality: N/A;  . CATARACT EXTRACTION W/ INTRAOCULAR LENS  IMPLANT, BILATERAL  2006  . CLIPPING OF ATRIAL APPENDAGE N/A 01/04/2016   Procedure: CLIPPING OF ATRIAL APPENDAGE;  Surgeon: Rexene Alberts, MD;  Location: [place];  Service: Open Heart Surgery;  Laterality: N/A;  . KNEE ARTHROSCOPY WITH MENISCAL REPAIR Left 1968  . MITRAL VALVE REPAIR Right 01/04/2016   Procedure: MINIMALLY INVASIVE MITRAL VALVE REPAIR (MVR);  Surgeon: Rexene Alberts, MD;  Location: [place];  Service: Open Heart Surgery;  Laterality: Right;  . PROSTATE SURGERY  2000   seed implants  . TEE WITHOUT CARDIOVERSION N/A 11/10/2015  Procedure: TRANSESOPHAGEAL ECHOCARDIOGRAM (TEE);  Surgeon: Skeet Latch, MD;  Location: [place];  Service: Cardiovascular;  Laterality: N/A;  . TEE WITHOUT CARDIOVERSION N/A 01/04/2016   Procedure: TRANSESOPHAGEAL ECHOCARDIOGRAM (TEE);  Surgeon: Rexene Alberts, MD;  Location: [place];  Service:  Open Heart Surgery;  Laterality: N/A;  . TOTAL KNEE ARTHROPLASTY Right 1955   Dr. Ethel Rana    Current Medications: Current Meds  Medication Sig  . alfuzosin (UROXATRAL) 10 MG 24 hr tablet Take 10 mg by mouth daily with breakfast. Take one daily to reduce bladder spasm  . aspirin 81 MG tablet Take 81 mg by mouth daily. Take one daily for anticoagulation  . augmented betamethasone dipropionate (DIPROLENE-AF) 0.05 % cream Apply 1 application topically daily.  . fluocinonide (LIDEX) 0.05 % external solution Apply 1 application topically 2 (two) times daily.   Marland Kitchen losartan (COZAAR) 25 MG tablet Take 25 mg by mouth daily.   . nabumetone (RELAFEN) 750 MG tablet Take 1 tablet (750 mg total) by mouth 2 (two) times daily.  . simvastatin (ZOCOR) 20 MG tablet TAKE 1 TABLET DAILY TO MAINTAIN LOW CHOLESTEROL  . TIROSINT 50 MCG CAPS TAKE 1 CAPSULE DAILY BEFORE BREAKFAST  . Triamcinolone Acetonide (TRIAMCINOLONE 0.1 % CREAM : EUCERIN) CREA Apply 1 application topically 2 (two) times daily.  . VESICARE 5 MG tablet Take 5 mg by mouth daily.   . [DISCONTINUED] TOPROL XL 25 MG 24 hr tablet TAKE ONE-HALF (1/2) TABLET DAILY  (NEED ENCOUNTER WITH CARDIOLOGIST BEFORE FURTHER REFILLS)     Allergies:   Patient has no known allergies.   Social History   Socioeconomic History  . Marital status: Married    Spouse name: Not on file  . Number of children: Not on file  . Years of education: Not on file  . Highest education level: Not on file  Occupational History  . Occupation: retired Engineer, materials at [employer]  . Financial resource strain: Not hard at all  . Food insecurity    Worry: Never true    Inability: Never true  . Transportation needs    Medical: No    Non-medical: No  Tobacco Use  . Smoking status: Former Smoker    Packs/day: 0.50    Years: 5.00    Pack years: 2.50    Types: Cigarettes    Quit date: 09/04/1969    Years since quitting: 49.9  . Smokeless tobacco: Never  Used  Substance and Sexual Activity  . Alcohol use: Yes    Alcohol/week: 2.0 standard drinks    Types: 2 Shots of liquor per week    Comment: 2 daily  . Drug use: No  . Sexual activity: Not on file  Lifestyle  . Physical activity    Days per week: 4 days    Minutes per session: 30 min  . Stress: To some extent  Relationships  . Social connections    Talks on phone: More than three times a week    Gets together: More than three times a week    Attends religious service: More than 4 times per year    Active member of club or organization: No    Attends meetings of clubs or organizations: Never    Relationship status: Married  Other Topics Concern  . Not on file  Social History Narrative   Lives at [place] since 05/2010   Married [name]    Has living will, POA   Retired from [employer] after 29 years  Stopped smoking 1970   Exercise run 1 1/2 mile every other day, swim 15 minutes every other day, use machines in gym   Alcohol 2 drinks daily     Family History: The patient's family history includes Heart disease in his father and mother.  ROS:   Please see the history of present illness.     All other systems reviewed and are negative.  EKGs/Labs/Other Studies Reviewed:    The following studies were reviewed today: Echo, cath, carotids looked at  EKG:  EKG is  ordered today.  The ekg ordered today demonstrates 08/03/2019-sinus rhythm left anterior fascicular block, nonspecific interventricular delay.  QRS is 132.  Recent Labs: 02/10/2019: ALT 28; BUN 21; Creatinine 1.3; Potassium 4.4; Sodium 140; TSH 4.72 06/23/2019: Hemoglobin 12.3; Platelets 213  Recent Lipid Panel    Component Value Date/Time   CHOL 151 02/10/2019   TRIG 84 02/10/2019   HDL 73 (A) 02/10/2019   LDLCALC 62 02/10/2019    Physical Exam:    VS:  BP 120/62   Pulse 63   Ht 5\' 8"  (1.727 m)   Wt 150 lb 9.6 oz (68.3 kg)   SpO2 96%   BMI 22.90 kg/m     Wt Readings from Last 3 Encounters:  08/03/19  150 lb 9.6 oz (68.3 kg)  06/17/19 150 lb (68 kg)  02/18/19 156 lb (70.8 kg)     GEN:  Well nourished, well developed in no acute distress HEENT: Normal NECK: No JVD; No carotid bruits LYMPHATICS: No lymphadenopathy CARDIAC: RRR, no murmurs, rubs, gallops RESPIRATORY:  Clear to auscultation without rales, wheezing or rhonchi  ABDOMEN: Soft, non-tender, non-distended MUSCULOSKELETAL:  No edema; No deformity  SKIN: Warm and dry NEUROLOGIC:  Alert and oriented x 3 PSYCHIATRIC:  Normal affect   ASSESSMENT:    1. PAF (paroxysmal atrial fibrillation) ([place])   2. Essential hypertension, benign   3. S/P minimally invasive mitral valve repair   4. LAFB (left anterior fascicular block)    PLAN:    In order of problems listed above:  Mitral valve repair -Dental prophylaxis. Dr. Roxy Manns. Swim.  We will give him amoxicillin to take as dental prophylaxis  Hyperlipidemia -Continue with current therapy.  LDL 62.  Paroxysmal atrial fibrillation -No further recurrence.  Left anterior fascicular block -Noted on ECG.  Essential hypertension -At the reduce losartan previously to 25 mg a day.  This may need to be discontinued according to Dr. Wynonia Lawman at last visit.  Actually, we will go ahead and discontinue his Toprol-XL 12.5 mg once a day.  Carotid artery disease -Mild 1 to 39%.  Bilateral.   Medication Adjustments/Labs and Tests Ordered: Current medicines are reviewed at length with the patient today.  Concerns regarding medicines are outlined above.  Orders Placed This Encounter  Procedures  . EKG 12-Lead   Meds ordered this encounter  Medications  . amoxicillin (AMOXIL) 500 MG tablet    Sig: Take 4 tablets (2,000 mg total) by mouth once for 1 dose. Take 2,000 mg (1) hour before dental appts    Dispense:  4 tablet    Refill:  3    Patient Instructions  Medication Instructions:  Please discontinue your Metoprolol. Continue all other medications as listed.  If you need a  refill on your cardiac medications before your next appointment, please call your pharmacy.   Follow-Up: At [facility], you and your health needs are our priority.  As part of our continuing mission to provide you with  exceptional heart care, we have created designated Provider Care Teams.  These Care Teams include your primary Cardiologist (physician) and Advanced Practice Providers (APPs -  Physician Assistants and Nurse Practitioners) who all work together to provide you with the care you need, when you need it. You will need a follow up appointment in 12 months.  Please call our office 2 months in advance to schedule this appointment.  You may see Candee Furbish, MD or one of the following Advanced Practice Providers on your designated Care Team:   Truitt Merle, NP Cecilie Kicks, NP . Kathyrn Drown, NP  Thank you for choosing [facility]!!         Signed, Candee Furbish, MD  08/03/2019 2:37 PM    [place]

## 2019-08-03 NOTE — Patient Instructions (Addendum)
Medication Instructions:  Please discontinue your Metoprolol. Continue all other medications as listed.  If you need a refill on your cardiac medications before your next appointment, please call your pharmacy.   Follow-Up: At [facility], you and your health needs are our priority.  As part of our continuing mission to provide you with exceptional heart care, we have created designated Provider Care Teams.  These Care Teams include your primary Cardiologist (physician) and Advanced Practice Providers (APPs -  Physician Assistants and Nurse Practitioners) who all work together to provide you with the care you need, when you need it. You will need a follow up appointment in 12 months.  Please call our office 2 months in advance to schedule this appointment.  You may see Candee Furbish, MD or one of the following Advanced Practice Providers on your designated Care Team:   Truitt Merle, NP Cecilie Kicks, NP . Kathyrn Drown, NP  Thank you for choosing [facility]!!

## 2019-08-06 DIAGNOSIS — L299 Pruritus, unspecified: Secondary | ICD-10-CM | POA: Diagnosis not present

## 2019-08-06 DIAGNOSIS — L209 Atopic dermatitis, unspecified: Secondary | ICD-10-CM | POA: Diagnosis not present

## 2019-08-11 DIAGNOSIS — Z961 Presence of intraocular lens: Secondary | ICD-10-CM | POA: Diagnosis not present

## 2019-08-19 DIAGNOSIS — R350 Frequency of micturition: Secondary | ICD-10-CM | POA: Diagnosis not present

## 2019-08-19 DIAGNOSIS — N401 Enlarged prostate with lower urinary tract symptoms: Secondary | ICD-10-CM | POA: Diagnosis not present

## 2019-08-19 DIAGNOSIS — Z8546 Personal history of malignant neoplasm of prostate: Secondary | ICD-10-CM | POA: Diagnosis not present

## 2019-08-20 ENCOUNTER — Encounter: Payer: Self-pay | Admitting: Family

## 2019-08-20 ENCOUNTER — Other Ambulatory Visit: Payer: Self-pay

## 2019-08-20 ENCOUNTER — Ambulatory Visit (INDEPENDENT_AMBULATORY_CARE_PROVIDER_SITE_OTHER): Payer: Medicare Other | Admitting: Family

## 2019-08-20 DIAGNOSIS — Z0000 Encounter for general adult medical examination without abnormal findings: Secondary | ICD-10-CM | POA: Diagnosis not present

## 2019-08-20 NOTE — Progress Notes (Signed)
Subjective:   Samuel [name] is a 85 y.o. male who presents for Medicare Annual/Subsequent preventive examination.  Review of Systems:  Cardiac Risk Factors include: male gender;advanced age (>8men, >56 women);dyslipidemia;smoking/ tobacco exposure     Objective:    Vitals: There were no vitals taken for this visit.  There is no height or weight on file to calculate BMI.  Advanced Directives 02/18/2019 08/13/2018 02/18/2018 12/25/2017 12/12/2016 08/29/2016 08/08/2016  Does Patient Have a Medical Advance Directive? Yes Yes Yes Yes Yes Yes Yes  Type of Paramedic of [place];Living will [place];Living will [place];Living will [place];Living will [place];Living will Living will;Healthcare Power of Attorney Living will;Healthcare Power of Attorney  Does patient want to make changes to medical advance directive? No - Patient declined No - Patient declined No - Patient declined No - Patient declined - - -  Copy of [place] in Chart? Yes - validated most recent copy scanned in chart (See row information) Yes Yes Yes Yes Yes Yes    Tobacco Social History   Tobacco Use  Smoking Status Former Smoker  . Packs/day: 0.50  . Years: 5.00  . Pack years: 2.50  . Types: Cigarettes  . Quit date: 09/04/1969  . Years since quitting: 49.9  Smokeless Tobacco Never Used     Counseling given: Not Answered   Clinical Intake:  Pre-visit preparation completed: No  Pain : No/denies pain     BMI - recorded: 22.81  How often do you need to have someone help you when you read instructions, pamphlets, or other written materials from your doctor or pharmacy?: 1 - Never What is the last grade level you completed in school?: PHd  Interpreter Needed?: No  Information entered by :: Corianna Avallone FNP-C  Past Medical History:  Diagnosis Date  . Aortoiliac occlusive disease  ([place])   . Carotid artery disease ([place])   . Complication of anesthesia 1955   sodium pentathol ?  Marland Kitchen Contracture of palmar fascia 06/21/2010  . Cortical senile cataract 06/27/2010  . Dysrhythmia   . Enthesopathy of hip region 11/27/2009  . Essential hypertension, benign 06/21/2010  . GERD (gastroesophageal reflux disease)    occ  . Hyperlipidemia LDL goal <100 06/21/2010  . Hypertrophy of prostate without urinary obstruction and other lower urinary tract symptoms (LUTS) 06/21/2010  . Hypothyroidism 08/27/2011  . Malignant neoplasm of prostate ([place]) 06/21/2010  . Mitral regurgitation 10/07/2015   Severe MR noted on ECHO with partially flail post leaflet Oct 03 2015   . Nocturia 2011  . Osteoarthrosis, unspecified whether generalized or localized, unspecified site 06/21/2010  . Other and unspecified hyperlipidemia 06/21/2010  . Pain in joint, lower leg 02/25/2012  . Palpitations 08/14/2010  . Patent foramen ovale 01/04/2016   Closed at the time of mitral valve repair   . S/P minimally invasive mitral valve repair 01/04/2016   Complex valvuloplasty including triangular resection of flail segment of posterior leaflet, artificial Gore-tex neochord placement x6 and 1mm Sorin Memo 3D ring annuloplasty via right mini thoracotomy approach with closure of PFO and clipping of LA appendage   . Unspecified constipation 06/21/2010  . Unspecified essential hypertension 06/21/2010  . Unspecified hypothyroidism 08/27/2011  . Urinary frequency 08/14/2008   Past Surgical History:  Procedure Laterality Date  . CARDIAC CATHETERIZATION N/A 11/10/2015   Procedure: Right/Left Heart Cath and Coronary Angiography;  Surgeon: Peter M Martinique, MD;  Location: [facility] IN[facility]AS[facility];  Service: Cardiovascular;  Laterality: N/A;  . CATARACT EXTRACTION W/ INTRAOCULAR LENS  IMPLANT, BILATERAL  2006  . CLIPPING OF ATRIAL APPENDAGE N/A 01/04/2016   Procedure: CLIPPING OF ATRIAL APPENDAGE;  Surgeon: Rexene Alberts, MD;  Location: [place];   Service: Open Heart Surgery;  Laterality: N/A;  . KNEE ARTHROSCOPY WITH MENISCAL REPAIR Left 1968  . MITRAL VALVE REPAIR Right 01/04/2016   Procedure: MINIMALLY INVASIVE MITRAL VALVE REPAIR (MVR);  Surgeon: Rexene Alberts, MD;  Location: [place];  Service: Open Heart Surgery;  Laterality: Right;  . PROSTATE SURGERY  2000   seed implants  . TEE WITHOUT CARDIOVERSION N/A 11/10/2015   Procedure: TRANSESOPHAGEAL ECHOCARDIOGRAM (TEE);  Surgeon: Skeet Latch, MD;  Location: [place];  Service: Cardiovascular;  Laterality: N/A;  . TEE WITHOUT CARDIOVERSION N/A 01/04/2016   Procedure: TRANSESOPHAGEAL ECHOCARDIOGRAM (TEE);  Surgeon: Rexene Alberts, MD;  Location: [place];  Service: Open Heart Surgery;  Laterality: N/A;  . TOTAL KNEE ARTHROPLASTY Right 1955   Dr. Ethel Rana   Family History  Problem Relation Age of Onset  . Heart disease Mother        myocardial infarction  . Heart disease Father        myocardial infarction   Social History   Socioeconomic History  . Marital status: Married    Spouse name: Not on file  . Number of children: Not on file  . Years of education: Not on file  . Highest education level: Not on file  Occupational History  . Occupation: retired Engineer, materials at [employer]  . Financial resource strain: Not hard at all  . Food insecurity    Worry: Never true    Inability: Never true  . Transportation needs    Medical: No    Non-medical: No  Tobacco Use  . Smoking status: Former Smoker    Packs/day: 0.50    Years: 5.00    Pack years: 2.50    Types: Cigarettes    Quit date: 09/04/1969    Years since quitting: 49.9  . Smokeless tobacco: Never Used  Substance and Sexual Activity  . Alcohol use: Yes    Alcohol/week: 2.0 standard drinks    Types: 2 Shots of liquor per week    Comment: 2 daily  . Drug use: No  . Sexual activity: Not on file  Lifestyle  . Physical activity    Days per week: 4 days    Minutes per session: 30 min   . Stress: To some extent  Relationships  . Social connections    Talks on phone: More than three times a week    Gets together: More than three times a week    Attends religious service: More than 4 times per year    Active member of club or organization: No    Attends meetings of clubs or organizations: Never    Relationship status: Married  Other Topics Concern  . Not on file  Social History Narrative   Lives at [place] since 05/2010   Married [name]    Has living will, POA   Retired from [employer] after 29 years   Stopped smoking 1970   Exercise run 1 1/2 mile every other day, swim 15 minutes every other day, use machines in gym   Alcohol 2 drinks daily    Outpatient Encounter Medications as of 08/20/2019  Medication Sig  . alfuzosin (UROXATRAL) 10 MG 24 hr tablet Take 10 mg by mouth  daily with breakfast. Take one daily to reduce bladder spasm  . aspirin 81 MG tablet Take 81 mg by mouth daily. Take one daily for anticoagulation  . augmented betamethasone dipropionate (DIPROLENE-AF) 0.05 % cream Apply 1 application topically daily.  Marland Kitchen losartan (COZAAR) 25 MG tablet Take 25 mg by mouth daily.   . nabumetone (RELAFEN) 750 MG tablet Take 1 tablet (750 mg total) by mouth 2 (two) times daily.  . simvastatin (ZOCOR) 20 MG tablet TAKE 1 TABLET DAILY TO MAINTAIN LOW CHOLESTEROL  . TIROSINT 50 MCG CAPS TAKE 1 CAPSULE DAILY BEFORE BREAKFAST  . VESICARE 5 MG tablet Take 5 mg by mouth daily.   . [DISCONTINUED] fluocinonide (LIDEX) 0.05 % external solution Apply 1 application topically 2 (two) times daily.   . [DISCONTINUED] Triamcinolone Acetonide (TRIAMCINOLONE 0.1 % CREAM : EUCERIN) CREA Apply 1 application topically 2 (two) times daily.   No facility-administered encounter medications on file as of 08/20/2019.     Activities of Daily Living In your present state of health, do you have any difficulty performing the following activities: 08/20/2019  Hearing? N  Vision? N  Difficulty  concentrating or making decisions? N  Walking or climbing stairs? N  Dressing or bathing? N  Doing errands, shopping? N  Preparing Food and eating ? N  Comment eat at facility one meal per day  Using the Toilet? N  In the past six months, have you accidently leaked urine? N  Do you have problems with loss of bowel control? N  Managing your Medications? N  Managing your Finances? N  Housekeeping or managing your Housekeeping? Y  Comment facility assist  Some recent data might be hidden    Patient Care Team: Gayland Curry, DO as PCP - General (Geriatric Medicine) Jerline Pain, MD as PCP - Cardiology (Cardiology) Community, [facility] [place] Donovan, NP as Nurse Practitioner (Geriatric Medicine) [name], MD as Consulting Physician (Cardiology) Raynelle Bring, MD as Consulting Physician (Urology)   Assessment:   This is a routine wellness examination for Juanjesus.  Exercise Activities and Dietary recommendations Current Exercise Habits: Home exercise routine, Type of exercise: walking;strength training/weights;Other - see comments(swimming), Time (Minutes): 40, Frequency (Times/Week): 3, Weekly Exercise (Minutes/Week): 120, Intensity: Moderate, Exercise limited by: None identified  Goals    . Exercise 3x per week (30 min per time)     Exercise every other day of the week        Fall Risk Fall Risk  06/17/2019 08/13/2018 05/07/2018 02/18/2018 12/25/2017  Falls in the past year? 0 No No No No  Number falls in past yr: 0 - - - -  Injury with Fall? 0 - - - -   Is the patient's home free of loose throw rugs in walkways, pet beds, electrical cords, etc?   no      Grab bars in the bathroom? yes      Handrails on the stairs?   no stairs       Adequate lighting?   yes  Depression Screen PHQ 2/9 Scores 06/17/2019 08/13/2018 05/07/2018 02/18/2018  PHQ - 2 Score 0 0 0 0    Cognitive Function MMSE - Mini Mental State Exam 02/18/2018 12/12/2016 11/02/2015   Orientation to time 5 5 5   Orientation to Place 5 5 5   Registration 3 3 3   Attention/ Calculation 4 5 5   Recall 2 1 1   Language- name 2 objects 2 2 2   Language- repeat 1 1 1  Language- follow 3 step command 3 3 3   Language- read & follow direction 1 1 1   Write a sentence 1 1 1   Copy design 1 1 1   Total score 28 28 28      6CIT Screen 08/20/2019  What Year? 0 points  What month? 0 points  What time? 0 points  Count back from 20 0 points  Months in reverse 0 points  Repeat phrase 4 points  Total Score 4    Immunization History  Administered Date(s) Administered  . Influenza, High Dose Seasonal PF 08/16/2017  . Influenza,inj,Quad PF,6+ Mos 07/20/2016, 08/15/2018, 07/31/2019  . Influenza-Unspecified 07/27/2013, 07/07/2014, 08/11/2015  . Pneumococcal Conjugate-13 10/26/2014  . Pneumococcal Polysaccharide-23 11/23/2015  . Td 10/23/2007  . Tdap 02/28/2015  . Zoster 10/22/2008  . Zoster Recombinat (Shingrix) 12/03/2016, 03/06/2017    Qualifies for Shingles Vaccine? Up date   Screening Tests Health Maintenance  Topic Date Due  . TETANUS/TDAP  02/27/2025  . INFLUENZA VACCINE  Completed  . PNA vac Low Risk Adult  Completed   Cancer Screenings: Lung: Low Dose CT Chest recommended if Age 26-80 years, 30 pack-year currently smoking OR have quit w/in 15years. Patient does not qualify. Colorectal:Age out   Additional Screenings: Hepatitis C Screening:Low Risk    Plan:   I have personally reviewed and noted the following in the patient's chart:   . Medical and social history . Use of alcohol, tobacco or illicit drugs  . Current medications and supplements . Functional ability and status . Nutritional status . Physical activity . Advanced directives . List of other physicians . Hospitalizations, surgeries, and ER visits in previous 12 months . Vitals . Screenings to include cognitive, depression, and falls . Referrals and appointments  In addition, I have reviewed  and discussed with patient certain preventive protocols, quality metrics, and best practice recommendations. A written personalized care plan for preventive services as well as general preventive health recommendations were provided to patient.   Sandrea Hughs, NP  08/20/2019

## 2019-08-20 NOTE — Progress Notes (Signed)
This service is provided via telemedicine  No vital signs collected/recorded due to the encounter was a telemedicine visit.   Location of patient (ex: home, work):  Home   Patient consents to a telephone visit:  Yes   Location of the provider (ex: office, home):  Office   Name of any referring provider:  [name] , [place] of all persons participating in the telemedicine service and their role in the encounter:  Marlowe Sax, NP, Ruthell Rummage CMA, and Patient   Time spent on call:  Ruthell Rummage CMA, spent 7 minutes on phone with patient.

## 2019-08-20 NOTE — Patient Instructions (Signed)
[name] , Thank you for taking time to come for your Medicare Wellness Visit. I appreciate your ongoing commitment to your health goals. Please review the following plan we discussed and let me know if I can assist you in the future.   Screening recommendations/referrals: Colonoscopy: N/A Recommended yearly ophthalmology/optometry visit for glaucoma screening and checkup Recommended yearly dental visit for hygiene and checkup  Vaccinations: Influenza vaccine: Up to date  Pneumococcal vaccine : Up to date  Tdap vaccine: Up to date  Shingles vaccine : Up to date    Advanced directives: Yes  Conditions/risks identified:Advance age male > 38 yrs,male Gender,Dyslipidemia,Hx smoking   Next appointment: 1 year   Preventive Care 77 Years and Older, Male Preventive care refers to lifestyle choices and visits with your health care provider that can promote health and wellness. What does preventive care include?  A yearly physical exam. This is also called an annual well check.  Dental exams once or twice a year.  Routine eye exams. Ask your health care provider how often you should have your eyes checked.  Personal lifestyle choices, including:  Daily care of your teeth and gums.  Regular physical activity.  Eating a healthy diet.  Avoiding tobacco and drug use.  Limiting alcohol use.  Practicing safe sex.  Taking low doses of aspirin every day.  Taking vitamin and mineral supplements as recommended by your health care provider. What happens during an annual well check? The services and screenings done by your health care provider during your annual well check will depend on your age, overall health, lifestyle risk factors, and family history of disease. Counseling  Your health care provider may ask you questions about your:  Alcohol use.  Tobacco use.  Drug use.  Emotional well-being.  Home and relationship well-being.  Sexual activity.  Eating habits.   History of falls.  Memory and ability to understand (cognition).  Work and work Statistician. Screening  You may have the following tests or measurements:  Height, weight, and BMI.  Blood pressure.  Lipid and cholesterol levels. These may be checked every 5 years, or more frequently if you are over 107 years old.  Skin check.  Lung cancer screening. You may have this screening every year starting at age 31 if you have a 30-pack-year history of smoking and currently smoke or have quit within the past 15 years.  Fecal occult blood test (FOBT) of the stool. You may have this test every year starting at age 44.  Flexible sigmoidoscopy or colonoscopy. You may have a sigmoidoscopy every 5 years or a colonoscopy every 10 years starting at age 31.  Prostate cancer screening. Recommendations will vary depending on your family history and other risks.  Hepatitis C blood test.  Hepatitis B blood test.  Sexually transmitted disease (STD) testing.  Diabetes screening. This is done by checking your blood sugar (glucose) after you have not eaten for a while (fasting). You may have this done every 1-3 years.  Abdominal aortic aneurysm (AAA) screening. You may need this if you are a current or former smoker.  Osteoporosis. You may be screened starting at age 55 if you are at high risk. Talk with your health care provider about your test results, treatment options, and if necessary, the need for more tests. Vaccines  Your health care provider may recommend certain vaccines, such as:  Influenza vaccine. This is recommended every year.  Tetanus, diphtheria, and acellular pertussis (Tdap, Td) vaccine. You may need a Td  booster every 10 years.  Zoster vaccine. You may need this after age 19.  Pneumococcal 13-valent conjugate (PCV13) vaccine. One dose is recommended after age 38.  Pneumococcal polysaccharide (PPSV23) vaccine. One dose is recommended after age 55. Talk to your health care  provider about which screenings and vaccines you need and how often you need them. This information is not intended to replace advice given to you by your health care provider. Make sure you discuss any questions you have with your health care provider. Document Released: 11/04/2015 Document Revised: 06/27/2016 Document Reviewed: 08/09/2015 Elsevier Interactive Patient Education  2017 [place] Prevention in the Home Falls can cause injuries. They can happen to people of all ages. There are many things you can do to make your home safe and to help prevent falls. What can I do on the outside of my home?  Regularly fix the edges of walkways and driveways and fix any cracks.  Remove anything that might make you trip as you walk through a door, such as a raised step or threshold.  Trim any bushes or trees on the path to your home.  Use bright outdoor lighting.  Clear any walking paths of anything that might make someone trip, such as rocks or tools.  Regularly check to see if handrails are loose or broken. Make sure that both sides of any steps have handrails.  Any raised decks and porches should have guardrails on the edges.  Have any leaves, snow, or ice cleared regularly.  Use sand or salt on walking paths during winter.  Clean up any spills in your garage right away. This includes oil or grease spills. What can I do in the bathroom?  Use night lights.  Install grab bars by the toilet and in the tub and shower. Do not use towel bars as grab bars.  Use non-skid mats or decals in the tub or shower.  If you need to sit down in the shower, use a plastic, non-slip stool.  Keep the floor dry. Clean up any water that spills on the floor as soon as it happens.  Remove soap buildup in the tub or shower regularly.  Attach bath mats securely with double-sided non-slip rug tape.  Do not have throw rugs and other things on the floor that can make you trip. What can I do in  the bedroom?  Use night lights.  Make sure that you have a light by your bed that is easy to reach.  Do not use any sheets or blankets that are too big for your bed. They should not hang down onto the floor.  Have a firm chair that has side arms. You can use this for support while you get dressed.  Do not have throw rugs and other things on the floor that can make you trip. What can I do in the kitchen?  Clean up any spills right away.  Avoid walking on wet floors.  Keep items that you use a lot in easy-to-reach places.  If you need to reach something above you, use a strong step stool that has a grab bar.  Keep electrical cords out of the way.  Do not use floor polish or wax that makes floors slippery. If you must use wax, use non-skid floor wax.  Do not have throw rugs and other things on the floor that can make you trip. What can I do with my stairs?  Do not leave any items on the stairs.  Make sure that there are handrails on both sides of the stairs and use them. Fix handrails that are broken or loose. Make sure that handrails are as long as the stairways.  Check any carpeting to make sure that it is firmly attached to the stairs. Fix any carpet that is loose or worn.  Avoid having throw rugs at the top or bottom of the stairs. If you do have throw rugs, attach them to the floor with carpet tape.  Make sure that you have a light switch at the top of the stairs and the bottom of the stairs. If you do not have them, ask someone to add them for you. What else can I do to help prevent falls?  Wear shoes that:  Do not have high heels.  Have rubber bottoms.  Are comfortable and fit you well.  Are closed at the toe. Do not wear sandals.  If you use a stepladder:  Make sure that it is fully opened. Do not climb a closed stepladder.  Make sure that both sides of the stepladder are locked into place.  Ask someone to hold it for you, if possible.  Clearly mark and  make sure that you can see:  Any grab bars or handrails.  First and last steps.  Where the edge of each step is.  Use tools that help you move around (mobility aids) if they are needed. These include:  Canes.  Walkers.  Scooters.  Crutches.  Turn on the lights when you go into a dark area. Replace any light bulbs as soon as they burn out.  Set up your furniture so you have a clear path. Avoid moving your furniture around.  If any of your floors are uneven, fix them.  If there are any pets around you, be aware of where they are.  Review your medicines with your doctor. Some medicines can make you feel dizzy. This can increase your chance of falling. Ask your doctor what other things that you can do to help prevent falls. This information is not intended to replace advice given to you by your health care provider. Make sure you discuss any questions you have with your health care provider. Document Released: 08/04/2009 Document Revised: 03/15/2016 Document Reviewed: 11/12/2014 Elsevier Interactive Patient Education  2017 [employer].

## 2019-08-24 ENCOUNTER — Encounter: Payer: Self-pay | Admitting: Internal Medicine

## 2019-08-24 NOTE — Telephone Encounter (Signed)
Message routed to Reed, Tiffany L, DO  

## 2019-09-01 ENCOUNTER — Other Ambulatory Visit (HOSPITAL_COMMUNITY): Payer: Medicare Other

## 2019-09-23 ENCOUNTER — Other Ambulatory Visit: Payer: Self-pay | Admitting: Cardiology

## 2019-09-23 MED ORDER — LOSARTAN POTASSIUM 25 MG PO TABS: 25.0000 mg | ORAL_TABLET | Freq: Every day | ORAL | 3 refills | Status: DC

## 2019-11-05 DIAGNOSIS — Z23 Encounter for immunization: Secondary | ICD-10-CM | POA: Diagnosis not present

## 2019-11-12 DIAGNOSIS — L299 Pruritus, unspecified: Secondary | ICD-10-CM | POA: Diagnosis not present

## 2019-11-12 DIAGNOSIS — L209 Atopic dermatitis, unspecified: Secondary | ICD-10-CM | POA: Diagnosis not present

## 2019-12-02 DIAGNOSIS — Z23 Encounter for immunization: Secondary | ICD-10-CM | POA: Diagnosis not present

## 2019-12-08 ENCOUNTER — Encounter: Payer: Self-pay | Admitting: Internal Medicine

## 2019-12-08 DIAGNOSIS — E039 Hypothyroidism, unspecified: Secondary | ICD-10-CM | POA: Diagnosis not present

## 2019-12-08 DIAGNOSIS — I10 Essential (primary) hypertension: Secondary | ICD-10-CM | POA: Diagnosis not present

## 2019-12-08 DIAGNOSIS — E785 Hyperlipidemia, unspecified: Secondary | ICD-10-CM | POA: Diagnosis not present

## 2019-12-08 LAB — BASIC METABOLIC PANEL
BUN: 22 — AB (ref 4–21)
CO2: 25 — AB (ref 13–22)
Chloride: 105 (ref 99–108)
Creatinine: 1.3 (ref 0.6–1.3)
Glucose: 88
Potassium: 4.4 (ref 3.4–5.3)
Sodium: 141 (ref 137–147)

## 2019-12-08 LAB — LIPID PANEL
Cholesterol: 167 (ref 0–200)
HDL: 65 (ref 35–70)
LDL Cholesterol: 88
Triglycerides: 72 (ref 40–160)

## 2019-12-08 LAB — CBC: RBC: 4.2 (ref 3.87–5.11)

## 2019-12-08 LAB — CBC AND DIFFERENTIAL
HCT: 38 — AB (ref 41–53)
Hemoglobin: 13.1 — AB (ref 13.5–17.5)
Platelets: 219 (ref 150–399)
WBC: 3.4

## 2019-12-08 LAB — COMPREHENSIVE METABOLIC PANEL
Albumin: 4 (ref 3.5–5.0)
Calcium: 9.2 (ref 8.7–10.7)
Globulin: 2.8

## 2019-12-08 LAB — TSH: TSH: 4.57 (ref 0.41–5.90)

## 2019-12-11 ENCOUNTER — Telehealth: Payer: Self-pay

## 2019-12-11 ENCOUNTER — Encounter: Payer: Self-pay | Admitting: Internal Medicine

## 2019-12-11 NOTE — Telephone Encounter (Signed)
Per Dr. Mariea Clonts mild anemia, mild elevation of TSH (monitor).Bad Cholesterol Trended up - goal < 70. Kidney function improved Electrolytes and liver normal. Results given to patient

## 2019-12-12 ENCOUNTER — Encounter: Payer: Self-pay | Admitting: Internal Medicine

## 2019-12-16 ENCOUNTER — Encounter: Payer: Self-pay | Admitting: Internal Medicine

## 2019-12-23 ENCOUNTER — Non-Acute Institutional Stay: Payer: Medicare Other | Admitting: Internal Medicine

## 2019-12-23 ENCOUNTER — Encounter: Payer: Self-pay | Admitting: Internal Medicine

## 2019-12-23 ENCOUNTER — Other Ambulatory Visit: Payer: Self-pay

## 2019-12-23 VITALS — BP 122/78 | HR 94 | Temp 97.7°F

## 2019-12-23 DIAGNOSIS — E7800 Pure hypercholesterolemia, unspecified: Secondary | ICD-10-CM

## 2019-12-23 DIAGNOSIS — R151 Fecal smearing: Secondary | ICD-10-CM

## 2019-12-23 DIAGNOSIS — I7409 Other arterial embolism and thrombosis of abdominal aorta: Secondary | ICD-10-CM

## 2019-12-23 DIAGNOSIS — Z636 Dependent relative needing care at home: Secondary | ICD-10-CM | POA: Diagnosis not present

## 2019-12-23 DIAGNOSIS — R21 Rash and other nonspecific skin eruption: Secondary | ICD-10-CM | POA: Diagnosis not present

## 2019-12-23 DIAGNOSIS — M19049 Primary osteoarthritis, unspecified hand: Secondary | ICD-10-CM

## 2019-12-23 MED ORDER — SIMVASTATIN 40 MG PO TABS: 40.0000 mg | ORAL_TABLET | Freq: Every day | ORAL | 3 refills | Status: DC

## 2019-12-23 NOTE — Progress Notes (Signed)
Location:  Occupational psychologist of Service:  Clinic (12)  Provider: Rogue Pautler L. Mariea Clonts, D.O., C.M.D.  Goals of Care:  Advanced Directives 12/23/2019  Does Patient Have a Medical Advance Directive? Yes  Type of Paramedic of [place];Living will  Does patient want to make changes to medical advance directive? -  Copy of [place] in Chart? -   Chief Complaint  Patient presents with  . Medical Management of Chronic Issues    6 month follow up/ private question for doc. questions about middle fingers not going down     HPI: Patient is a 86 y.o. male seen today for medical management of chronic diseases.    He's having slight bowel leakage which is more of a nuisance than a problem.  Not daily, but close.  Started about 6 mos ago.  Has to clean himself up a couple of times a day, but does not get to the point where it smears his underwear.  No changes in his BMs.  He's very regularly.  Very seldom constipated.  There is increased mucus and he sees feces when he wipes himself.  He has had a h/o hemorrhoids, but not bothered by them in 8-10 years.  No pain.  No blood.  Last day or two stool has been black which is unusual.  He does eat a lot of greens.  Has no sensation of stool coming out--just notices wetness.    H/H 13.1/38 which is actually improved.  LDL was above goal at 88.  Goal less than 70.  Thyroid was at goal.  His right middle finer is becoming arched and left is also starting to get that way.  He does get trigger fingers at times that improve with hot water.  He says if it's happening every couple of weeks, he would want an injection or tendon release if indicated.    Ankles doe continue to swell periodically.  The dupixent is working miracles for his rash.  He has to keep going until the fall per dermatology.     Past Medical History:  Diagnosis Date  . Aortoiliac occlusive disease ([place])   . Carotid artery  disease ([place])   . Complication of anesthesia 1955   sodium pentathol ?  Marland Kitchen Contracture of palmar fascia 06/21/2010  . Cortical senile cataract 06/27/2010  . Dysrhythmia   . Enthesopathy of hip region 11/27/2009  . Essential hypertension, benign 06/21/2010  . GERD (gastroesophageal reflux disease)    occ  . Hyperlipidemia LDL goal <100 06/21/2010  . Hypertrophy of prostate without urinary obstruction and other lower urinary tract symptoms (LUTS) 06/21/2010  . Hypothyroidism 08/27/2011  . Malignant neoplasm of prostate ([place]) 06/21/2010  . Mitral regurgitation 10/07/2015   Severe MR noted on ECHO with partially flail post leaflet Oct 03 2015   . Nocturia 2011  . Osteoarthrosis, unspecified whether generalized or localized, unspecified site 06/21/2010  . Other and unspecified hyperlipidemia 06/21/2010  . Pain in joint, lower leg 02/25/2012  . Palpitations 08/14/2010  . Patent foramen ovale 01/04/2016   Closed at the time of mitral valve repair   . S/P minimally invasive mitral valve repair 01/04/2016   Complex valvuloplasty including triangular resection of flail segment of posterior leaflet, artificial Gore-tex neochord placement x6 and 59mm Sorin Memo 3D ring annuloplasty via right mini thoracotomy approach with closure of PFO and clipping of LA appendage   . Unspecified constipation 06/21/2010  . Unspecified essential hypertension 06/21/2010  .  Unspecified hypothyroidism 08/27/2011  . Urinary frequency 08/14/2008    Past Surgical History:  Procedure Laterality Date  . CARDIAC CATHETERIZATION N/A 11/10/2015   Procedure: Right/Left Heart Cath and Coronary Angiography;  Surgeon: Peter M Martinique, MD;  Location: [facility];  Service: Cardiovascular;  Laterality: N/A;  . CATARACT EXTRACTION W/ INTRAOCULAR LENS  IMPLANT, BILATERAL  2006  . CLIPPING OF ATRIAL APPENDAGE N/A 01/04/2016   Procedure: CLIPPING OF ATRIAL APPENDAGE;  Surgeon: Rexene Alberts, MD;  Location: [facility];  Service: Open Heart Surgery;   Laterality: N/A;  . KNEE ARTHROSCOPY WITH MENISCAL REPAIR Left 1968  . MITRAL VALVE REPAIR Right 01/04/2016   Procedure: MINIMALLY INVASIVE MITRAL VALVE REPAIR (MVR);  Surgeon: Rexene Alberts, MD;  Location: [facility];  Service: Open Heart Surgery;  Laterality: Right;  . PROSTATE SURGERY  2000   seed implants  . TEE WITHOUT CARDIOVERSION N/A 11/10/2015   Procedure: TRANSESOPHAGEAL ECHOCARDIOGRAM (TEE);  Surgeon: Skeet Latch, MD;  Location: [facility];  Service: Cardiovascular;  Laterality: N/A;  . TEE WITHOUT CARDIOVERSION N/A 01/04/2016   Procedure: TRANSESOPHAGEAL ECHOCARDIOGRAM (TEE);  Surgeon: Rexene Alberts, MD;  Location: [facility];  Service: Open Heart Surgery;  Laterality: N/A;  . TOTAL KNEE ARTHROPLASTY Right 1955   Dr. Ethel Rana    No Known Allergies  Outpatient Encounter Medications as of 12/23/2019  Medication Sig  . alfuzosin (UROXATRAL) 10 MG 24 hr tablet Take 10 mg by mouth daily with breakfast. Take one daily to reduce bladder spasm  . aspirin 81 MG tablet Take 81 mg by mouth daily. Take one daily for anticoagulation  . augmented betamethasone dipropionate (DIPROLENE-AF) 0.05 % cream Apply 1 application topically daily.  . DUPIXENT 300 MG/2ML SOPN   . losartan (COZAAR) 25 MG tablet Take 1 tablet (25 mg total) by mouth daily.  . nabumetone (RELAFEN) 750 MG tablet Take 1 tablet (750 mg total) by mouth 2 (two) times daily.  . simvastatin (ZOCOR) 20 MG tablet TAKE 1 TABLET DAILY TO MAINTAIN LOW CHOLESTEROL  . TIROSINT 50 MCG CAPS TAKE 1 CAPSULE DAILY BEFORE BREAKFAST  . VESICARE 5 MG tablet Take 5 mg by mouth daily.    No facility-administered encounter medications on file as of 12/23/2019.    Review of Systems:  Review of Systems  Constitutional: Negative for chills, fever and malaise/fatigue.  HENT: Negative for congestion and sore throat.   Eyes: Negative for blurred vision.  Respiratory: Negative for cough and shortness of breath.   Cardiovascular: Positive for leg  swelling. Negative for chest pain and palpitations.       Intermittent edema, not persistent  Gastrointestinal: Negative for abdominal pain, blood in stool, constipation, diarrhea and melena.       Stool sometimes dark; having fecal smearing  Genitourinary: Negative for dysuria.  Musculoskeletal: Negative for falls and joint pain.  Skin: Negative for itching and rash.    Health Maintenance  Topic Date Due  . TETANUS/TDAP  02/27/2025  . INFLUENZA VACCINE  Completed  . PNA vac Low Risk Adult  Completed    Physical Exam: Vitals:   12/23/19 1018  BP: 122/78  Pulse: 94  Temp: 97.7 F (36.5 C)  SpO2: 98%   There is no height or weight on file to calculate BMI. Physical Exam Vitals reviewed. Exam conducted with a chaperone present.  Constitutional:      General: He is not in acute distress.    Appearance: Normal appearance. He is normal weight. He is not toxic-appearing.  HENT:     Head: Normocephalic and atraumatic.  Cardiovascular:     Rate and Rhythm: Normal rate and regular rhythm.     Heart sounds: No murmur.  Pulmonary:     Effort: Pulmonary effort is normal.     Breath sounds: Normal breath sounds. No wheezing, rhonchi or rales.  Abdominal:     General: Bowel sounds are normal. There is no distension.     Palpations: Abdomen is soft. There is no mass.     Tenderness: There is no abdominal tenderness. There is no guarding or rebound.  Genitourinary:    Rectum: Guaiac result negative.     Comments: External hemorrhoids present; some fecal leakage seen Musculoskeletal:        General: Normal range of motion.     Right lower leg: Edema present.     Left lower leg: Edema present.  Skin:    General: Skin is warm and dry.  Neurological:     General: No focal deficit present.     Mental Status: He is alert and oriented to person, place, and time.     Cranial Nerves: No cranial nerve deficit.     Motor: No weakness.     Coordination: Coordination normal.     Gait:  Gait normal.  Psychiatric:        Mood and Affect: Mood normal.     Labs reviewed: Basic Metabolic Panel: Recent Labs    02/10/19 0000 02/10/19 0800 12/08/19 0500  NA 140  --  141  K 4.4  --  4.4  CL  --   --  105  CO2  --   --  25*  BUN 21  --  22*  CREATININE 1.3  --  1.3  CALCIUM  --   --  9.2  TSH  --  4.72 4.57   Liver Function Tests: Recent Labs    02/10/19 0800 12/08/19 0500  AST 36  --   ALT 28  --   ALKPHOS 81  --   ALBUMIN  --  4.0   No results for input(s): LIPASE, AMYLASE in the last 8760 hours. No results for input(s): AMMONIA in the last 8760 hours. CBC: Recent Labs    02/10/19 0000 06/23/19 0000 12/08/19 0500  WBC 4.5 3.6 3.4  HGB 12.9* 12.3* 13.1*  HCT 37* 36* 38*  PLT 218 213 219   Lipid Panel: Recent Labs    02/10/19 0000 12/08/19 0500  CHOL 151 167  HDL 73* 65  LDLCALC 62 88  TRIG 84 72   Lab Results  Component Value Date   HGBA1C 5.5 01/02/2016     Assessment/Plan 1. Fecal smearing -new issue for him in the past 6 mos which he is very embarrassed about -recommended trying to use metamucil daily to bulk his stool in hopes there will be less leakage from the sphincter -he has some hemorrhoids there, but was heme-negative on rectal exam -call back or send mychart message if not improving with the fiber  2. Pure hypercholesterolemia - at goal, renew zocor, cont swimming and healthy eating habits - simvastatin (ZOCOR) 40 MG tablet; Take 1 tablet (40 mg total) by mouth daily at 6 PM.  Dispense: 90 tablet; Refill: 3  3. Rash of back -derm notes reviewed -continues dupixent and topical therapy  4. Chronic aorto-iliac occlusion syndrome (HCC) -continues to have intermittent edema--suspect some role of level of activity and sodium intake  5. Caregiver stress -continues while caring for his  wife at home, but he has not said anything about needing for this to change since taking her back home from memory care  6. Hand  arthritis -with some developing contracture and trigger fingers--advised to maintain activity and he will let me know if it bothers him enough to see hand surgery   Labs/tests ordered:  No new Next appt:  04/27/2020   Marilu Rylander L. Shondrika Hoque, D.O. [facility] [address], [place] [address] Cell Phone (Mon-Fri 8am-5pm):  [phone] On Call:  [phone] & follow prompts after 5pm & weekends Office Phone:  [phone] Office Fax:  [phone]

## 2020-01-12 ENCOUNTER — Other Ambulatory Visit: Payer: Self-pay | Admitting: Internal Medicine

## 2020-01-20 ENCOUNTER — Encounter: Payer: Self-pay | Admitting: Internal Medicine

## 2020-02-10 ENCOUNTER — Other Ambulatory Visit: Payer: Self-pay | Admitting: Internal Medicine

## 2020-02-10 DIAGNOSIS — I7409 Other arterial embolism and thrombosis of abdominal aorta: Secondary | ICD-10-CM

## 2020-02-10 MED ORDER — FUROSEMIDE 40 MG PO TABS: 40.0000 mg | ORAL_TABLET | Freq: Every day | ORAL | 0 refills | Status: DC

## 2020-02-10 NOTE — Progress Notes (Signed)
Pt arrived in my office c/o edema in his ankles.  He does admit to using salt on his food at times.  Discussed that the dining room food already has a good bit of sodium so I advise against adding to it.  He would like to get some lasix to help when the ankles do swell.  Reminded to eat a banana daily while taking it and to only use the lasix when needed not daily.

## 2020-02-11 ENCOUNTER — Other Ambulatory Visit: Payer: Self-pay | Admitting: Internal Medicine

## 2020-03-12 ENCOUNTER — Other Ambulatory Visit: Payer: Self-pay | Admitting: Internal Medicine

## 2020-03-12 DIAGNOSIS — I7409 Other arterial embolism and thrombosis of abdominal aorta: Secondary | ICD-10-CM

## 2020-03-14 MED ORDER — FUROSEMIDE 40 MG PO TABS: 40.0000 mg | ORAL_TABLET | Freq: Every day | ORAL | 5 refills | Status: DC

## 2020-03-14 NOTE — Telephone Encounter (Signed)
Left message on voicemail for patient to return call when available   Reason for call: Advise patient he needs to get labs a [facility] on Tuesday or Thursday to check BMP.   Order faxed to [facility], Attention Mliss Sax or [facility].

## 2020-03-14 NOTE — Telephone Encounter (Signed)
Ok to have refills, but he should get his BMP checked at [facility] to make sure his potassium is not getting low if he's using the lasix regularly

## 2020-03-14 NOTE — Telephone Encounter (Signed)
Patient was only approved for # 30 on 02/10/2020. Please advise if ok to give additional refills

## 2020-03-17 DIAGNOSIS — T887XXA Unspecified adverse effect of drug or medicament, initial encounter: Secondary | ICD-10-CM | POA: Diagnosis not present

## 2020-03-17 DIAGNOSIS — D649 Anemia, unspecified: Secondary | ICD-10-CM | POA: Diagnosis not present

## 2020-03-17 DIAGNOSIS — I10 Essential (primary) hypertension: Secondary | ICD-10-CM | POA: Diagnosis not present

## 2020-03-17 LAB — COMPREHENSIVE METABOLIC PANEL: Calcium: 9 (ref 8.7–10.7)

## 2020-03-17 LAB — BASIC METABOLIC PANEL
BUN: 28 — AB (ref 4–21)
CO2: 24 — AB (ref 13–22)
Chloride: 102 (ref 99–108)
Creatinine: 1.4 — AB (ref 0.6–1.3)
Glucose: 78
Potassium: 4.5 (ref 3.4–5.3)
Sodium: 139 (ref 137–147)

## 2020-03-25 ENCOUNTER — Encounter: Payer: Self-pay | Admitting: Internal Medicine

## 2020-04-12 DIAGNOSIS — L820 Inflamed seborrheic keratosis: Secondary | ICD-10-CM | POA: Diagnosis not present

## 2020-04-12 DIAGNOSIS — L209 Atopic dermatitis, unspecified: Secondary | ICD-10-CM | POA: Diagnosis not present

## 2020-04-27 ENCOUNTER — Encounter: Payer: Self-pay | Admitting: Internal Medicine

## 2020-04-27 ENCOUNTER — Non-Acute Institutional Stay: Payer: Medicare Other | Admitting: Internal Medicine

## 2020-04-27 ENCOUNTER — Other Ambulatory Visit: Payer: Self-pay

## 2020-04-27 VITALS — BP 110/64 | HR 71 | Temp 96.9°F | Wt 156.0 lb

## 2020-04-27 DIAGNOSIS — I10 Essential (primary) hypertension: Secondary | ICD-10-CM | POA: Diagnosis not present

## 2020-04-27 DIAGNOSIS — E7800 Pure hypercholesterolemia, unspecified: Secondary | ICD-10-CM | POA: Diagnosis not present

## 2020-04-27 DIAGNOSIS — Z636 Dependent relative needing care at home: Secondary | ICD-10-CM | POA: Diagnosis not present

## 2020-04-27 DIAGNOSIS — I480 Paroxysmal atrial fibrillation: Secondary | ICD-10-CM

## 2020-04-27 DIAGNOSIS — R151 Fecal smearing: Secondary | ICD-10-CM | POA: Diagnosis not present

## 2020-04-27 DIAGNOSIS — I7409 Other arterial embolism and thrombosis of abdominal aorta: Secondary | ICD-10-CM | POA: Diagnosis not present

## 2020-04-27 DIAGNOSIS — L209 Atopic dermatitis, unspecified: Secondary | ICD-10-CM

## 2020-04-27 NOTE — Progress Notes (Signed)
Location:  Occupational psychologist of Service:  Clinic (12)  Provider: Jonael Paradiso L. Mariea Clonts, D.O., C.M.D.  Code Status: full code Goals of Care:  Advanced Directives 04/27/2020  Does Patient Have a Medical Advance Directive? Yes  Type of Advance Directive Living will  Does patient want to make changes to medical advance directive? No - Patient declined  Copy of [place] in Chart? -   Chief Complaint  Patient presents with  . Medical Management of Chronic Issues    4 month follow-up     HPI: Patient is a 86 y.o. male seen today for medical management of chronic diseases.    He "merged his [facility] and [employer] [facility]"  I can now see derm notes.    Legs do better with the diuretic.  He asks if there are alternatives.  I discussed compression hose which he declines.  Still has some leakage of stool.  Some days are ok, but other days.  Depends on consistency of stool.  Fiber supplement did not help.  If it gets worse, then he's willing to see GI.  It's just an annoyance now.  Swimming at least once a week.  He's also using the machines in the gym.  Walks around [employer].    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  There are good days and not so good days.    Past Medical History:  Diagnosis Date  . Aortoiliac occlusive disease ([place])   . Carotid artery disease ([place])   . Complication of anesthesia 1955   sodium pentathol ?  Marland Kitchen Contracture of palmar fascia 06/21/2010  . Cortical senile cataract 06/27/2010  . Dysrhythmia   . Enthesopathy of hip region 11/27/2009  . Essential hypertension, benign 06/21/2010  . GERD (gastroesophageal reflux disease)    occ  . Hyperlipidemia LDL goal <100 06/21/2010  . Hypertrophy of prostate without urinary obstruction and other lower urinary tract symptoms (LUTS) 06/21/2010  . Hypothyroidism 08/27/2011  . Malignant neoplasm of prostate ([place]) 06/21/2010  . Mitral regurgitation 10/07/2015   Severe MR noted on ECHO  with partially flail post leaflet Oct 03 2015   . Nocturia 2011  . Osteoarthrosis, unspecified whether generalized or localized, unspecified site 06/21/2010  . Other and unspecified hyperlipidemia 06/21/2010  . Pain in joint, lower leg 02/25/2012  . Palpitations 08/14/2010  . Patent foramen ovale 01/04/2016   Closed at the time of mitral valve repair   . S/P minimally invasive mitral valve repair 01/04/2016   Complex valvuloplasty including triangular resection of flail segment of posterior leaflet, artificial Gore-tex neochord placement x6 and 72mm Sorin Memo 3D ring annuloplasty via right mini thoracotomy approach with closure of PFO and clipping of LA appendage   . Unspecified constipation 06/21/2010  . Unspecified essential hypertension 06/21/2010  . Unspecified hypothyroidism 08/27/2011  . Urinary frequency 08/14/2008    Past Surgical History:  Procedure Laterality Date  . CARDIAC CATHETERIZATION N/A 11/10/2015   Procedure: Right/Left Heart Cath and Coronary Angiography;  Surgeon: Peter M Martinique, MD;  Location: [facility];  Service: Cardiovascular;  Laterality: N/A;  . CATARACT EXTRACTION W/ INTRAOCULAR LENS  IMPLANT, BILATERAL  2006  . CLIPPING OF ATRIAL APPENDAGE N/A 01/04/2016   Procedure: CLIPPING OF ATRIAL APPENDAGE;  Surgeon: Rexene Alberts, MD;  Location: [facility];  Service: Open Heart Surgery;  Laterality: N/A;  . KNEE ARTHROSCOPY WITH MENISCAL REPAIR Left 1968  . MITRAL VALVE REPAIR Right 01/04/2016   Procedure: MINIMALLY  INVASIVE MITRAL VALVE REPAIR (MVR);  Surgeon: Rexene Alberts, MD;  Location: [facility];  Service: Open Heart Surgery;  Laterality: Right;  . PROSTATE SURGERY  2000   seed implants  . TEE WITHOUT CARDIOVERSION N/A 11/10/2015   Procedure: TRANSESOPHAGEAL ECHOCARDIOGRAM (TEE);  Surgeon: Skeet Latch, MD;  Location: [facility];  Service: Cardiovascular;  Laterality: N/A;  . TEE WITHOUT CARDIOVERSION N/A 01/04/2016   Procedure: TRANSESOPHAGEAL ECHOCARDIOGRAM  (TEE);  Surgeon: Rexene Alberts, MD;  Location: [facility];  Service: Open Heart Surgery;  Laterality: N/A;  . TOTAL KNEE ARTHROPLASTY Right 1955   Dr. Ethel Rana    No Known Allergies  Outpatient Encounter Medications as of 04/27/2020  Medication Sig  . alfuzosin (UROXATRAL) 10 MG 24 hr tablet Take 10 mg by mouth daily with breakfast. Take one daily to reduce bladder spasm  . aspirin 81 MG tablet Take 81 mg by mouth daily. Take one daily for anticoagulation  . DUPIXENT 300 MG/2ML SOPN   . furosemide (LASIX) 40 MG tablet Take 1 tablet (40 mg total) by mouth daily.  Marland Kitchen losartan (COZAAR) 25 MG tablet Take 1 tablet (25 mg total) by mouth daily.  . nabumetone (RELAFEN) 750 MG tablet TAKE 1 TABLET TWICE A DAY  . simvastatin (ZOCOR) 40 MG tablet Take 1 tablet (40 mg total) by mouth daily at 6 PM.  . TIROSINT 50 MCG CAPS TAKE 1 CAPSULE DAILY BEFORE BREAKFAST  . VESICARE 5 MG tablet Take 5 mg by mouth daily.   . [DISCONTINUED] augmented betamethasone dipropionate (DIPROLENE-AF) 0.05 % cream Apply 1 application topically daily.   No facility-administered encounter medications on file as of 04/27/2020.    Review of Systems:  Review of Systems  Constitutional: Negative for chills, fever and malaise/fatigue.  HENT: Negative for congestion and sore throat.   Eyes: Negative for blurred vision.  Respiratory: Negative for cough and shortness of breath.   Cardiovascular: Positive for leg swelling. Negative for chest pain and palpitations.  Gastrointestinal: Negative for abdominal pain, blood in stool, constipation and melena.       Fecal leakage  Genitourinary: Negative for dysuria.  Musculoskeletal: Negative for falls and joint pain.  Skin: Positive for itching and rash.       Controlled now with dupixent  Neurological: Negative for dizziness and loss of consciousness.  Psychiatric/Behavioral: Negative for depression and memory loss. The patient is not nervous/anxious and does not have insomnia.        Does  often forget his wife's meds if they go out for their supper instead of bringing the food back to their home    Health Maintenance  Topic Date Due  . INFLUENZA VACCINE  05/22/2020  . TETANUS/TDAP  02/27/2025  . COVID-19 Vaccine  Completed  . PNA vac Low Risk Adult  Completed    Physical Exam: Vitals:   04/27/20 1334  BP: 110/64  Pulse: 71  Temp: (!) 96.9 F (36.1 C)  TempSrc: Temporal  SpO2: 97%  Weight: 156 lb (70.8 kg)   Body mass index is 23.72 kg/m. Physical Exam Vitals reviewed.  Constitutional:      General: He is not in acute distress.    Appearance: Normal appearance. He is not ill-appearing or toxic-appearing.  HENT:     Head: Normocephalic and atraumatic.  Eyes:     Conjunctiva/sclera: Conjunctivae normal.     Pupils: Pupils are equal, round, and reactive to light.  Cardiovascular:     Rate and Rhythm: Normal rate and regular rhythm.  Heart sounds: No murmur heard.   Pulmonary:     Effort: Pulmonary effort is normal.     Breath sounds: Normal breath sounds. No rales.  Abdominal:     General: Bowel sounds are normal.     Palpations: Abdomen is soft.  Musculoskeletal:        General: Normal range of motion.     Right lower leg: Edema present.     Left lower leg: Edema present.     Comments: 1+ edema bilaterally  Skin:    General: Skin is warm and dry.  Neurological:     General: No focal deficit present.     Mental Status: He is alert and oriented to person, place, and time.  Psychiatric:        Mood and Affect: Mood normal.        Behavior: Behavior normal.        Thought Content: Thought content normal.        Judgment: Judgment normal.     Labs reviewed: Basic Metabolic Panel: Recent Labs    12/08/19 0500 03/17/20 0800  NA 141 139  K 4.4 4.5  CL 105 102  CO2 25* 24*  BUN 22* 28*  CREATININE 1.3 1.4*  CALCIUM 9.2 9.0  TSH 4.57  --    Liver Function Tests: Recent Labs    12/08/19 0500  ALBUMIN 4.0   No results for  input(s): LIPASE, AMYLASE in the last 8760 hours. No results for input(s): AMMONIA in the last 8760 hours. CBC: Recent Labs    06/23/19 0000 12/08/19 0500  WBC 3.6 3.4  HGB 12.3* 13.1*  HCT 36* 38*  PLT 213 219   Lipid Panel: Recent Labs    12/08/19 0500  CHOL 167  HDL 65  LDLCALC 88  TRIG 72   Lab Results  Component Value Date   HGBA1C 5.5 01/02/2016    Procedures since last visit: No results found.  Assessment/Plan 1. Chronic aorto-iliac occlusion syndrome (HCC) -Felt to be the cause of his edema.  Fortunately he has had no dyspnea on exertion.  Continue Lasix 40 mg daily.  BMP was assessed to make sure his potassium is within range.  2. Fecal smearing Remains a problem but he is coping with it better. Fiber did not really help.  It has not reached a point where he feels the need to see a GI specialist.  3. Pure hypercholesterolemia Continue Zocor for LDL goal less than 70  4. Caregiver stress -seems to cope well, walks and swimming help him; Joan's memory does continue to decline, but she's still able to participate in her adl routine though she requires a good bit of guidance -He is administering her pills.  They have been getting missed sometimes if they go out to dinner.  Keep an eye on this.  5. Atopic dermatitis, unspecified type -doing great on dupixent [facility]  6. Essential hypertension, benign -bp excellent, no dizziness, cont same regimen and monitor  7. PAF (paroxysmal atrial fibrillation) (HCC) -one time deal after his mitral surgery -no trouble since  Labs/tests ordered:  Cbc with diff, cmp, flp before Next appt:  6 mos med mgt with labs before  Desarai Barrack L. Ural Acree, D.O. [facility] [address], [place] [address] Cell Phone (Mon-Fri 8am-5pm):  [phone] On Call:  [phone] & follow prompts after 5pm & weekends Office Phone:  [phone] Office Fax:  [phone]

## 2020-08-03 ENCOUNTER — Encounter: Payer: Self-pay | Admitting: Cardiology

## 2020-08-03 ENCOUNTER — Other Ambulatory Visit: Payer: Self-pay

## 2020-08-03 ENCOUNTER — Ambulatory Visit (INDEPENDENT_AMBULATORY_CARE_PROVIDER_SITE_OTHER): Payer: Medicare Other | Admitting: Cardiology

## 2020-08-03 VITALS — BP 116/70 | HR 115 | Ht 68.0 in | Wt 158.0 lb

## 2020-08-03 DIAGNOSIS — Z9889 Other specified postprocedural states: Secondary | ICD-10-CM

## 2020-08-03 DIAGNOSIS — I471 Supraventricular tachycardia: Secondary | ICD-10-CM

## 2020-08-03 DIAGNOSIS — I444 Left anterior fascicular block: Secondary | ICD-10-CM

## 2020-08-03 DIAGNOSIS — I480 Paroxysmal atrial fibrillation: Secondary | ICD-10-CM | POA: Diagnosis not present

## 2020-08-03 NOTE — Progress Notes (Signed)
Cardiology Office Note:    Date:  08/03/2020   ID:  Elson Clan, DOB Oct 17, 1932, MRN [id number]  PCP:  Gayland Curry, DO  [facility] Cardiologist:  Candee Furbish, MD  [facility] Electrophysiologist:  None   Referring MD: Gayland Curry, DO     History of Present Illness:    Chilton Sallade is a 86 y.o. male here for the follow-up of paroxysmal atrial fibrillation.  Prior clipping of left atrial appendage.  Small PFO closure in 2017.  Catheterization previously sewed normal LV function and no CAD.  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  Going to be celebrating his 65th anniversary from [facility].  His wife continues to have worsening dementia.  Overall he feels well exercising, lifting weights.  Doing well.  No chest pain no shortness of breath.     Past Medical History:  Diagnosis Date   Aortoiliac occlusive disease ([place])    Carotid artery disease ([place])    Complication of anesthesia 1955   sodium pentathol ?   Contracture of palmar fascia 06/21/2010   Cortical senile cataract 06/27/2010   Dysrhythmia    Enthesopathy of hip region 11/27/2009   Essential hypertension, benign 06/21/2010   GERD (gastroesophageal reflux disease)    occ   Hyperlipidemia LDL goal <100 06/21/2010   Hypertrophy of prostate without urinary obstruction and other lower urinary tract symptoms (LUTS) 06/21/2010   Hypothyroidism 08/27/2011   Malignant neoplasm of prostate ([place]) 06/21/2010   Mitral regurgitation 10/07/2015   Severe MR noted on ECHO with partially flail post leaflet Oct 03 2015    Nocturia 2011   Osteoarthrosis, unspecified whether generalized or localized, unspecified site 06/21/2010   Other and unspecified hyperlipidemia 06/21/2010   Pain in joint, lower leg 02/25/2012   Palpitations 08/14/2010   Patent foramen ovale 01/04/2016   Closed at the time of mitral valve repair    S/P minimally invasive mitral valve repair 01/04/2016   Complex  valvuloplasty including triangular resection of flail segment of posterior leaflet, artificial Gore-tex neochord placement x6 and 95mm Sorin Memo 3D ring annuloplasty via right mini thoracotomy approach with closure of PFO and clipping of LA appendage    Unspecified constipation 06/21/2010   Unspecified essential hypertension 06/21/2010   Unspecified hypothyroidism 08/27/2011   Urinary frequency 08/14/2008    Past Surgical History:  Procedure Laterality Date   CARDIAC CATHETERIZATION N/A 11/10/2015   Procedure: Right/Left Heart Cath and Coronary Angiography;  Surgeon: Peter M Martinique, MD;  Location: [facility];  Service: Cardiovascular;  Laterality: N/A;   CATARACT EXTRACTION W/ INTRAOCULAR LENS  IMPLANT, BILATERAL  2006   CLIPPING OF ATRIAL APPENDAGE N/A 01/04/2016   Procedure: CLIPPING OF ATRIAL APPENDAGE;  Surgeon: Rexene Alberts, MD;  Location: [facility];  Service: Open Heart Surgery;  Laterality: N/A;   KNEE ARTHROSCOPY WITH MENISCAL REPAIR Left 1968   MITRAL VALVE REPAIR Right 01/04/2016   Procedure: MINIMALLY INVASIVE MITRAL VALVE REPAIR (MVR);  Surgeon: Rexene Alberts, MD;  Location: [facility];  Service: Open Heart Surgery;  Laterality: Right;   PROSTATE SURGERY  2000   seed implants   TEE WITHOUT CARDIOVERSION N/A 11/10/2015   Procedure: TRANSESOPHAGEAL ECHOCARDIOGRAM (TEE);  Surgeon: Skeet Latch, MD;  Location: [facility];  Service: Cardiovascular;  Laterality: N/A;   TEE WITHOUT CARDIOVERSION N/A 01/04/2016   Procedure: TRANSESOPHAGEAL ECHOCARDIOGRAM (TEE);  Surgeon: Rexene Alberts, MD;  Location: [facility];  Service: Open Heart Surgery;  Laterality: N/A;  TOTAL KNEE ARTHROPLASTY Right 1955   Dr. Ethel Rana    Current Medications: Current Meds  Medication Sig   alfuzosin (UROXATRAL) 10 MG 24 hr tablet Take 10 mg by mouth daily with breakfast. Take one daily to reduce bladder spasm   aspirin 81 MG tablet Take 81 mg by mouth daily. Take one daily for anticoagulation     DUPIXENT 300 MG/2ML SOPN    furosemide (LASIX) 40 MG tablet Take 1 tablet (40 mg total) by mouth daily.   losartan (COZAAR) 25 MG tablet Take 1 tablet (25 mg total) by mouth daily.   nabumetone (RELAFEN) 750 MG tablet TAKE 1 TABLET TWICE A DAY   simvastatin (ZOCOR) 40 MG tablet Take 1 tablet (40 mg total) by mouth daily at 6 PM.   TIROSINT 50 MCG CAPS TAKE 1 CAPSULE DAILY BEFORE BREAKFAST   VESICARE 5 MG tablet Take 5 mg by mouth daily.      Allergies:   Patient has no known allergies.   Social History   Socioeconomic History   Marital status: Married    Spouse name: Not on file   Number of children: Not on file   Years of education: Not on file   Highest education level: Not on file  Occupational History   Occupation: retired Engineer, materials at [employer]   Smoking status: Former Smoker    Packs/day: 0.50    Years: 5.00    Pack years: 2.50    Types: Cigarettes    Quit date: 09/04/1969    Years since quitting: 50.9   Smokeless tobacco: Never Used  Substance and Sexual Activity   Alcohol use: Yes    Alcohol/week: 2.0 standard drinks    Types: 2 Shots of liquor per week    Comment: 2 daily   Drug use: No   Sexual activity: Not on file  Other Topics Concern   Not on file  Social History Narrative   Lives at [place] since 05/2010   Married [name]    Has living will, POA   Retired from [employer] after 29 years   Stopped smoking 1970   Exercise run 1 1/2 mile every other day, swim 15 minutes every other day, use machines in gym   Alcohol 2 drinks daily   Social Determinants of Health   Financial Resource Strain:    Difficulty of Paying Living Expenses: Not on file  Food Insecurity:    Worried About Charity fundraiser in the Last Year: Not on file   [employer] of Food in the Last Year: Not on file  Transportation Needs:    Lack of Transportation (Medical): Not on file   Lack of Transportation (Non-Medical): Not on file   Physical Activity:    Days of Exercise per Week: Not on file   Minutes of Exercise per Session: Not on file  Stress:    Feeling of Stress : Not on file  Social Connections:    Frequency of Communication with Friends and Family: Not on file   Frequency of Social Gatherings with Friends and Family: Not on file   Attends Religious Services: Not on file   Active Member of Clubs or Organizations: Not on file   Attends Archivist Meetings: Not on file   Marital Status: Not on file     Family History: The patient's family history includes Heart disease in his father and mother.  ROS:   Please see the history of present illness.  All other systems reviewed and are negative.  EKGs/Labs/Other Studies Reviewed:      EKG:  EKG is  ordered today.  The ekg ordered today demonstrates paroxysmal atrial tachycardia rate 115 bpm, left anterior fascicular block chronic.  Recent Labs: 12/08/2019: Hemoglobin 13.1; Platelets 219; TSH 4.57 03/17/2020: BUN 28; Creatinine 1.4; Potassium 4.5; Sodium 139  Recent Lipid Panel    Component Value Date/Time   CHOL 167 12/08/2019 0500   TRIG 72 12/08/2019 0500   HDL 65 12/08/2019 0500   LDLCALC 88 12/08/2019 0500     Risk Assessment/Calculations:       Physical Exam:    VS:  BP 116/70    Pulse (!) 115    Ht 5\' 8"  (1.727 m)    Wt 158 lb (71.7 kg)    SpO2 93%    BMI 24.02 kg/m     Wt Readings from Last 3 Encounters:  08/03/20 158 lb (71.7 kg)  04/27/20 156 lb (70.8 kg)  08/03/19 150 lb 9.6 oz (68.3 kg)     GEN:  Well nourished, well developed in no acute distress HEENT: Normal NECK: No JVD; No carotid bruits LYMPHATICS: No lymphadenopathy CARDIAC: RRR, no murmurs, rubs, gallops, occasional ectopy RESPIRATORY:  Clear to auscultation without rales, wheezing or rhonchi  ABDOMEN: Soft, non-tender, non-distended MUSCULOSKELETAL:  No edema; No deformity  SKIN: Warm and dry NEUROLOGIC:  Alert and oriented x 3 PSYCHIATRIC:   Normal affect   ASSESSMENT:    1. Paroxysmal atrial fibrillation (HCC)   2. S/P minimally invasive mitral valve repair   3. LAFB (left anterior fascicular block)   4. Paroxysmal atrial tachycardia (HCC)    PLAN:    In order of problems listed above:  Paroxysmal atrial fibrillation -Today's EKG is demonstrating heart rate of 115 bpm looks like possible sinus tachycardia.  I only see a small P waves preceding each QRS complex.  Unusual heart rate however.  Could be some atrial tachycardia.  When I auscultated him his heart rate had slowed down.  Overall feeling well.  No strokes.  No changes with medical management.  Consider aspirin every other day.  Left anterior fascicular block -Chronic.  Mitral valve repair -Dr. Roxy Manns.  Dental prophylaxis.  Essential hypertension -Excellent.  On losartan 25 mg a day.  Excellent blood pressure control.  No changes made with medical management.  Mild carotid plaque noted.  We talked about aspirin prophylaxis.  He could consider every other day aspirin.  Medication Adjustments/Labs and Tests Ordered: Current medicines are reviewed at length with the patient today.  Concerns regarding medicines are outlined above.  Orders Placed This Encounter  Procedures   EKG 12-Lead   No orders of the defined types were placed in this encounter.   Patient Instructions  Medication Instructions:  Your physician recommends that you continue on your current medications as directed. Please refer to the Current Medication list given to you today.  *If you need a refill on your cardiac medications before your next appointment, please call your pharmacy*  Lab Work: If you have labs (blood work) drawn today and your tests are completely normal, you will receive your results only by:  [place] (if you have MyChart) OR  A paper copy in the mail If you have any lab test that is abnormal or we need to change your treatment, we will call you to review the  results.  Testing/Procedures: None ordered today.  Follow-Up: At [facility], you and your health needs are  our priority.  As part of our continuing mission to provide you with exceptional heart care, we have created designated Provider Care Teams.  These Care Teams include your primary Cardiologist (physician) and Advanced Practice Providers (APPs -  Physician Assistants and Nurse Practitioners) who all work together to provide you with the care you need, when you need it.  We recommend signing up for the patient portal called "MyChart".  Sign up information is provided on this After Visit Summary.  MyChart is used to connect with patients for Virtual Visits (Telemedicine).  Patients are able to view lab/test results, encounter notes, upcoming appointments, etc.  Non-urgent messages can be sent to your provider as well.   To learn more about what you can do with MyChart, go to [email or web address].    Your next appointment:   12 month(s)  The format for your next appointment:   In Person  Provider:   You may see Candee Furbish, MD or one of the following Advanced Practice Providers on your designated Care Team:    Truitt Merle, NP  Cecilie Kicks, NP  Kathyrn Drown, NP       Signed, Candee Furbish, MD  08/03/2020 1:57 PM    [place]

## 2020-08-03 NOTE — Patient Instructions (Addendum)
Medication Instructions:  Your physician recommends that you continue on your current medications as directed. Please refer to the Current Medication list given to you today.  *If you need a refill on your cardiac medications before your next appointment, please call your pharmacy*  Lab Work: If you have labs (blood work) drawn today and your tests are completely normal, you will receive your results only by: Marland Kitchen MyChart Message (if you have MyChart) OR . A paper copy in the mail If you have any lab test that is abnormal or we need to change your treatment, we will call you to review the results.  Testing/Procedures: None ordered today.  Follow-Up: At [facility], you and your health needs are our priority.  As part of our continuing mission to provide you with exceptional heart care, we have created designated Provider Care Teams.  These Care Teams include your primary Cardiologist (physician) and Advanced Practice Providers (APPs -  Physician Assistants and Nurse Practitioners) who all work together to provide you with the care you need, when you need it.  We recommend signing up for the patient portal called "MyChart".  Sign up information is provided on this After Visit Summary.  MyChart is used to connect with patients for Virtual Visits (Telemedicine).  Patients are able to view lab/test results, encounter notes, upcoming appointments, etc.  Non-urgent messages can be sent to your provider as well.   To learn more about what you can do with MyChart, go to [email or web address].    Your next appointment:   12 month(s)  The format for your next appointment:   In Person  Provider:   You may see Candee Furbish, MD or one of the following Advanced Practice Providers on your designated Care Team:    Truitt Merle, NP  Cecilie Kicks, NP  Kathyrn Drown, NP

## 2020-08-10 ENCOUNTER — Non-Acute Institutional Stay: Payer: Medicare Other | Admitting: Internal Medicine

## 2020-08-10 ENCOUNTER — Encounter: Payer: Self-pay | Admitting: Internal Medicine

## 2020-08-10 ENCOUNTER — Other Ambulatory Visit: Payer: Self-pay

## 2020-08-10 VITALS — BP 122/82 | HR 68 | Temp 98.1°F | Ht 67.0 in | Wt 160.2 lb

## 2020-08-10 DIAGNOSIS — S80211A Abrasion, right knee, initial encounter: Secondary | ICD-10-CM | POA: Diagnosis not present

## 2020-08-10 DIAGNOSIS — S43014A Anterior dislocation of right humerus, initial encounter: Secondary | ICD-10-CM | POA: Diagnosis not present

## 2020-08-10 DIAGNOSIS — M25511 Pain in right shoulder: Secondary | ICD-10-CM | POA: Diagnosis not present

## 2020-08-10 DIAGNOSIS — W19XXXA Unspecified fall, initial encounter: Secondary | ICD-10-CM

## 2020-08-10 NOTE — Progress Notes (Signed)
Location:  [facility]   Place of Service:  Clinic (12)clinic  Provider: Donnalyn Juran L. Mariea Clonts, D.O., C.M.D.  Goals of Care:  Advanced Directives 08/10/2020  Does Patient Have a Medical Advance Directive? Yes  Type of Advance Directive Living will;Healthcare Power of Attorney  Does patient want to make changes to medical advance directive? -  Copy of [place] in Chart? Yes - validated most recent copy scanned in chart (See row information)   Chief Complaint  Patient presents with  . Acute Visit    Fall     HPI: Patient is a 86 y.o. male seen today for an acute visit for fall [place]  He was walking and tried to social distance from a lady and her son and stepped off the side of the road and tripped on a root that was covered up with leaves.  He went down on his right side sustaining an abrasion to his right knee and he's been unable to lift his right arm since.  It feels funny and tingly in his hand and he cannot move it beyond just his hand.  Looking at him, it's clear he has dislocated his right shoulder.  It is painful locally.  He appears a bit pale and admits the fall scared him.  He drove back from the park with his left hand.  I advised that he not drive to ortho and [facility] could take him, but after [facility] confirmed he could walk in, he drove himself anyway and refused security's transportation offer.    Past Medical History:  Diagnosis Date  . Aortoiliac occlusive disease ([place])   . Carotid artery disease ([place])   . Complication of anesthesia 1955   sodium pentathol ?  Marland Kitchen Contracture of palmar fascia 06/21/2010  . Cortical senile cataract 06/27/2010  . Dysrhythmia   . Enthesopathy of hip region 11/27/2009  . Essential hypertension, benign 06/21/2010  . GERD (gastroesophageal reflux disease)    occ  . Hyperlipidemia LDL goal <100 06/21/2010  . Hypertrophy of prostate without urinary obstruction and other  lower urinary tract symptoms (LUTS) 06/21/2010  . Hypothyroidism 08/27/2011  . Malignant neoplasm of prostate ([place]) 06/21/2010  . Mitral regurgitation 10/07/2015   Severe MR noted on ECHO with partially flail post leaflet Oct 03 2015   . Nocturia 2011  . Osteoarthrosis, unspecified whether generalized or localized, unspecified site 06/21/2010  . Other and unspecified hyperlipidemia 06/21/2010  . Pain in joint, lower leg 02/25/2012  . Palpitations 08/14/2010  . Patent foramen ovale 01/04/2016   Closed at the time of mitral valve repair   . S/P minimally invasive mitral valve repair 01/04/2016   Complex valvuloplasty including triangular resection of flail segment of posterior leaflet, artificial Gore-tex neochord placement x6 and 69mm Sorin Memo 3D ring annuloplasty via right mini thoracotomy approach with closure of PFO and clipping of LA appendage   . Unspecified constipation 06/21/2010  . Unspecified essential hypertension 06/21/2010  . Unspecified hypothyroidism 08/27/2011  . Urinary frequency 08/14/2008    Past Surgical History:  Procedure Laterality Date  . CARDIAC CATHETERIZATION N/A 11/10/2015   Procedure: Right/Left Heart Cath and Coronary Angiography;  Surgeon: Peter M Martinique, MD;  Location: [facility];  Service: Cardiovascular;  Laterality: N/A;  . CATARACT EXTRACTION W/ INTRAOCULAR LENS  IMPLANT, BILATERAL  2006  . CLIPPING OF ATRIAL APPENDAGE N/A 01/04/2016   Procedure: CLIPPING OF ATRIAL APPENDAGE;  Surgeon: Rexene Alberts, MD;  Location: [facility];  Service: Open Heart Surgery;  Laterality: N/A;  . KNEE ARTHROSCOPY WITH MENISCAL REPAIR Left 1968  . MITRAL VALVE REPAIR Right 01/04/2016   Procedure: MINIMALLY INVASIVE MITRAL VALVE REPAIR (MVR);  Surgeon: Rexene Alberts, MD;  Location: [facility];  Service: Open Heart Surgery;  Laterality: Right;  . PROSTATE SURGERY  2000   seed implants  . TEE WITHOUT CARDIOVERSION N/A 11/10/2015   Procedure: TRANSESOPHAGEAL ECHOCARDIOGRAM (TEE);   Surgeon: Skeet Latch, MD;  Location: [facility];  Service: Cardiovascular;  Laterality: N/A;  . TEE WITHOUT CARDIOVERSION N/A 01/04/2016   Procedure: TRANSESOPHAGEAL ECHOCARDIOGRAM (TEE);  Surgeon: Rexene Alberts, MD;  Location: [facility];  Service: Open Heart Surgery;  Laterality: N/A;  . TOTAL KNEE ARTHROPLASTY Right 1955   Dr. Ethel Rana    No Known Allergies  Outpatient Encounter Medications as of 08/10/2020  Medication Sig  . alfuzosin (UROXATRAL) 10 MG 24 hr tablet Take 10 mg by mouth daily with breakfast. Take one daily to reduce bladder spasm  . aspirin 81 MG tablet Take 81 mg by mouth daily. Take one daily for anticoagulation  . DUPIXENT 300 MG/2ML SOPN   . furosemide (LASIX) 40 MG tablet Take 1 tablet (40 mg total) by mouth daily.  Marland Kitchen losartan (COZAAR) 25 MG tablet Take 1 tablet (25 mg total) by mouth daily.  . nabumetone (RELAFEN) 750 MG tablet TAKE 1 TABLET TWICE A DAY  . simvastatin (ZOCOR) 40 MG tablet Take 1 tablet (40 mg total) by mouth daily at 6 PM.  . TIROSINT 50 MCG CAPS TAKE 1 CAPSULE DAILY BEFORE BREAKFAST  . VESICARE 5 MG tablet Take 5 mg by mouth daily.    No facility-administered encounter medications on file as of 08/10/2020.    Review of Systems:  Review of Systems  Constitutional: Negative for chills, fever and malaise/fatigue.  HENT: Negative for congestion and sore throat.   Eyes: Negative for blurred vision.  Respiratory: Negative for cough and shortness of breath.   Cardiovascular: Negative for chest pain, palpitations and leg swelling.  Gastrointestinal: Negative for abdominal pain, blood in stool, constipation, diarrhea and melena.  Genitourinary: Negative for dysuria.  Musculoskeletal: Positive for falls and joint pain.  Skin: Negative for itching and rash.  Neurological: Negative for dizziness and loss of consciousness.  Psychiatric/Behavioral: Negative for depression and memory loss. The patient is not nervous/anxious and does not have insomnia.      Health Maintenance  Topic Date Due  . INFLUENZA VACCINE  05/22/2020  . TETANUS/TDAP  02/27/2025  . COVID-19 Vaccine  Completed  . PNA vac Low Risk Adult  Completed    Physical Exam: Vitals:   08/10/20 1555  BP: 122/82  Pulse: 68  Temp: 98.1 F (36.7 C)  TempSrc: Temporal  SpO2: 94%  Weight: 160 lb 3.2 oz (72.7 kg)  Height: 5\' 7"  (1.702 m)   Body mass index is 25.09 kg/m. Physical Exam Vitals reviewed.  Constitutional:      Appearance: Normal appearance.     Comments: pale  HENT:     Head: Normocephalic and atraumatic.  Cardiovascular:     Rate and Rhythm: Normal rate and regular rhythm.     Pulses: Normal pulses.     Heart sounds: Normal heart sounds.  Pulmonary:     Effort: Pulmonary effort is normal.  Musculoskeletal:        General: Tenderness present.     Right lower leg: No edema.     Left lower leg: No edema.  Comments: Tender over anterior shoulder which is dislocated anteriorly  Skin:    General: Skin is warm and dry.     Comments: Right knee abrasion (multiple tiny abrasions)  Neurological:     General: No focal deficit present.     Mental Status: He is alert and oriented to person, place, and time.  Psychiatric:        Mood and Affect: Mood normal.     Labs reviewed: Basic Metabolic Panel: Recent Labs    12/08/19 0500 03/17/20 0000 03/17/20 0800  NA 141 139  --   K 4.4 4.5  --   CL 105 102  --   CO2 25* 24*  --   BUN 22* 28*  --   CREATININE 1.3 1.4*  --   CALCIUM 9.2  --  9.0  TSH 4.57  --   --    Liver Function Tests: Recent Labs    12/08/19 0500  ALBUMIN 4.0   No results for input(s): LIPASE, AMYLASE in the last 8760 hours. No results for input(s): AMMONIA in the last 8760 hours. CBC: Recent Labs    12/08/19 0500  WBC 3.4  HGB 13.1*  HCT 38*  PLT 219   Lipid Panel: Recent Labs    12/08/19 0500  CHOL 167  HDL 65  LDLCALC 88  TRIG 72   Lab Results  Component Value Date   HGBA1C 5.5 01/02/2016     Procedures since last visit: No results found.  Assessment/Plan 1. Fall, initial encounter -mechanical in nature with injuries below  2. Anterior dislocation of right shoulder, initial encounter -pain does not seem severe enough for fx; however, he needs to have a reduction of his shoulder -[facility] nursing assisted in making him an urgent referral to [facility] walk in due to anterior shoulder dislocation  3. Abrasion of right knee, initial encounter -cleansed with wound spray and dressed with bandaids -new dressing supplied when the initial ones become soiled  Labs/tests ordered:  Urgent referral to ortho Next appt:  08/26/2020  Tommaso Cavitt L. Vaun Hyndman, D.O. [facility] [address], [place] [address] Cell Phone (Mon-Fri 8am-5pm):  [phone] On Call:  [phone] & follow prompts after 5pm & weekends Office Phone:  [phone] Office Fax:  [phone]

## 2020-08-11 ENCOUNTER — Other Ambulatory Visit: Payer: Self-pay

## 2020-08-11 ENCOUNTER — Encounter (HOSPITAL_COMMUNITY): Payer: Self-pay | Admitting: Orthopedic Surgery

## 2020-08-11 ENCOUNTER — Other Ambulatory Visit (HOSPITAL_COMMUNITY)
Admission: RE | Admit: 2020-08-11 | Discharge: 2020-08-11 | Disposition: A | Discharge status: Home / Self Care | Payer: Medicare Other | Source: Ambulatory Visit | Attending: Orthopedic Surgery | Admitting: Orthopedic Surgery

## 2020-08-11 DIAGNOSIS — L2084 Intrinsic (allergic) eczema: Secondary | ICD-10-CM | POA: Diagnosis not present

## 2020-08-11 DIAGNOSIS — Z20822 Contact with and (suspected) exposure to covid-19: Secondary | ICD-10-CM | POA: Diagnosis not present

## 2020-08-11 DIAGNOSIS — L820 Inflamed seborrheic keratosis: Secondary | ICD-10-CM | POA: Diagnosis not present

## 2020-08-11 DIAGNOSIS — Z01812 Encounter for preprocedural laboratory examination: Secondary | ICD-10-CM | POA: Diagnosis not present

## 2020-08-11 DIAGNOSIS — L209 Atopic dermatitis, unspecified: Secondary | ICD-10-CM | POA: Diagnosis not present

## 2020-08-11 LAB — SARS CORONAVIRUS 2 (TAT 6-24 HRS): SARS Coronavirus 2: NEGATIVE

## 2020-08-11 NOTE — Progress Notes (Signed)
[name] denies chest pain or shortness of breath. Patient denies palpations.  Patient had Covid test today and is in quarantine with his wife. PCP is Dr. Percell Belt. Cardiologist is Dr. Marlou Porch. [name] is schedule for a reverse shoulder replacement.  I asked patient if there is anyone at [facility] that can come to the hospital to pick up shower scrub, Benzoyl Peroxide and Pre- surgery Ensure. Patient said no. I took patient's address and will drop the supplies and instructions.

## 2020-08-11 NOTE — Progress Notes (Signed)
Anesthesia Chart Review: Same day workup  Follows with cardiology for history of paroxysmal atrial fibrillation, severe MR status post MVR.  He underwent clipping of left atrial appendage and PFO closure in 2017 at the time of his mitral valve repair.  Cardiac catheterization 2017 prior to mitral valve repair showed nonobstructive CAD.  Echo 08/27/2017  showed normally functioning prosthetic mitral valve repair with trace regurgitation.  Mildly restricted mitral valve leaflets..  Last seen by Dr. Marlou Porch 08/03/2020.  Per note he felt well overall, was exercising, lifting weights.  It was noted that EKG performed at that appointment showed heart rate 115 with possible sinus tachycardia.  Per note, "Today's EKG is demonstrating heart rate of 115 bpm looks like possible sinus tachycardia.  I only see a small P waves preceding each QRS complex.  Unusual heart rate however.  Could be some atrial tachycardia.  When I auscultated him his heart rate had slowed down.  Overall feeling well.  No strokes.  No changes with medical management.  Consider aspirin every other day."  He was advised to follow-up in 12 months.  We will need day of surgery labs and evaluation.  EKG 08/03/20: Paroxysmal atrial tachycardia. Rate 115.   TTE 08/27/2017: Conclusions: 1.  Moderate concentric LVH with normal global wall motion.  Doppler evidence of grade 1 diastolic dysfunction.  Estimated EF 55%. 2.  Moderate left atrial enlargement. 3.  Normally functioning prosthetic mitral valve repair with trace regurgitation.  Mildly restricted mitral valve leaflets. 4.  Mild tricuspid regurgitation. 5.  Trace pulmonic regurgitation. 6.  IVC is dilated with respiratory variation. 7.  Since last echo interval improvement in LVEF.  Right/left heart cath 11/10/2015 (prior to MVR):  Mid LAD to Dist LAD lesion, 40% stenosed.  The left ventricular systolic function is normal.   1. Nonobstructive CAD 2. Normal LV function 3. Severe MR 4.  Normal right heart and LV filling pressures.   Zade, [facility] Short Stay Center/Anesthesiology Phone [phone] 08/11/2020 3:51 PM

## 2020-08-11 NOTE — Progress Notes (Addendum)
Your procedure is scheduled on   Report to [facility], Main Entrance or Entrance "A" at 7:30 AM                 Your surgery or procedure is scheduled to begin at 10:00   Call this number if you have problems the morning of surgery: [phone]  This is the number for the Pre- Surgical Desk.    Remember:  Do not eat or drink after midnight.  You may drink clear liquids until 7:00 .   Clear liquids allowed are:   Water, Juice (non-citric and without pulp - diabetics please choose diet or no sugar options), Carbonated beverages - (diabetics please choose diet or no sugar options), Clear Tea, Black Coffee only (no creamer, milk or cream including half and half), Plain Jell-O only (diabetics please choose diet or no sugar options), Gatorade (diabetics please choose diet or no sugar options) and Plain Popsicles only  Drink Pre- Surgery Ensure between 6:00 AM and 8:00 AM   Take these medicines the morning of surgery with A SIP OF WATER:- before 7:00 AM Metoprolol Tirosint  Tylenol if needed.  1 Week prior to surgery STOP taking Aspirin, Aspirin Products (Goody Powder, Excedrin Migraine), Ibuprofen (Advil), Naproxen (Aleve), Vitamins and Herbal Products (ie Fish Oil).    Special instructions:    Samuel [name]- Preparing For Surgery  Before surgery, you can play an important role. Because skin is not sterile, your skin needs to be as free of germs as possible. You can reduce the number of germs on your skin by washing with CHG (chlorahexidine gluconate) Soap before surgery.  CHG is an antiseptic cleaner which kills germs and bonds with the skin to continue killing germs even after washing.    Oral Hygiene is also important to reduce your risk of infection.  Remember - BRUSH YOUR TEETH THE MORNING OF SURGERY WITH YOUR REGULAR TOOTHPASTE  Please do not use if you have an allergy to CHG or antibacterial soaps. If your skin becomes reddened/irritated stop using the CHG.  Do not shave  (including legs and underarms) for at least 48 hours prior to first CHG shower. It is OK to shave your face.  Please follow these instructions carefully.   1. Shower the NIGHT BEFORE SURGERY and the MORNING OF SURGERY with CHG.   2. If you chose to wash your hair, wash your hair first as usual with your normal shampoo.  3. After you shampoo, wash your face and private area with the soap you use at home, then rinse your hair and body thoroughly to remove the shampoo and soap.  4. Use CHG as you would any other liquid soap. You can apply CHG directly to the skin and wash gently with a scrungie or a clean washcloth.   5. Apply the CHG Soap to your body ONLY FROM THE NECK DOWN.  Do not use on open wounds or open sores. Avoid contact with your eyes, ears, mouth and genitals (private parts).   6. Wash thoroughly, paying special attention to the area where your surgery will be performed.  7. Thoroughly rinse your body with warm water from the neck down.  8. DO NOT shower/wash with your normal soap after using and rinsing off the CHG Soap.  9. Pat yourself dry with a CLEAN TOWEL.  10. Wear CLEAN PAJAMAS to bed the night before surgery, wear comfortable clothes the morning of surgery  11. Place CLEAN SHEETS on your bed the  night of your first shower and DO NOT SLEEP WITH PETS. Apply Benzoyl Peroxide Gel to right shoulder.  Day of Surgery: Shower as instructed above. Do not apply any deodorants/lotions, powders or colognes.  Please wear clean clothes to the hospital/surgery center.   Remember to brush your teeth WITH YOUR REGULAR TOOTHPASTE.  Do not wear jewelry, make-up or nail polish.  Do not shave 48 hours prior to surgery.  Men may shave face and neck.  Do not bring valuables to the hospital.  [facility] is not responsible for any belongings or valuables.  Contacts, dentures or bridgework may not be worn into surgery.  Leave your suitcase in the car.  After surgery it may be brought  to your room.

## 2020-08-11 NOTE — Anesthesia Preprocedure Evaluation (Addendum)
Anesthesia Evaluation  Patient identified by MRN, date of birth, ID band Patient awake    Reviewed: Allergy & Precautions, NPO status , Patient's Chart, lab work & pertinent test results  History of Anesthesia Complications (+) PONV  Airway Mallampati: I  TM Distance: >3 FB Neck ROM: Full    Dental  (+) Dental Advisory Given   Pulmonary former smoker,  08/11/2020 SARS coronavirus NEG   breath sounds clear to auscultation       Cardiovascular hypertension, Pt. on medications and Pt. on home beta blockers (-) angina+ Peripheral Vascular Disease  + Valvular Problems/Murmurs (s/p MV repair and PFO closure)  Rhythm:Regular Rate:Normal  '18 ECHO: EF 55%, s/p MV repair with MV functioning well   Jenita Seashore, MD   Neuro/Psych negative neurological ROS     GI/Hepatic Neg liver ROS, GERD  Controlled,  Endo/Other  Hypothyroidism   Renal/GU Renal InsufficiencyRenal disease (creat 1.3)     Musculoskeletal  (+) Arthritis ,   Abdominal   Peds  Hematology negative hematology ROS (+)   Anesthesia Other Findings Prostate cancer  Reproductive/Obstetrics                           Anesthesia Physical Anesthesia Plan  ASA: III  Anesthesia Plan: General   Post-op Pain Management: GA combined w/ Regional for post-op pain   Induction: Intravenous  PONV Risk Score and Plan: 3 and Ondansetron and Dexamethasone  Airway Management Planned: Oral ETT  Additional Equipment: None  Intra-op Plan:   Post-operative Plan: Extubation in OR  Informed Consent: I have reviewed the patients History and Physical, chart, labs and discussed the procedure including the risks, benefits and alternatives for the proposed anesthesia with the patient or authorized representative who has indicated his/her understanding and acceptance.     Dental advisory given  Plan Discussed with: CRNA and Surgeon  Anesthesia Plan  Comments: (PAT note by Karoline Caldwell, PA-C: Follows with cardiology for history of paroxysmal atrial fibrillation, severe MR status post MVR.  He underwent clipping of left atrial appendage and PFO closure in 2017 at the time of his mitral valve repair.  Cardiac catheterization 2017 prior to mitral valve repair showed nonobstructive CAD.  Echo 08/27/2017  showed normally functioning prosthetic mitral valve repair with trace regurgitation.  Mildly restricted mitral valve leaflets..  Last seen by Dr. Marlou Porch 08/03/2020.  Per note he felt well overall, was exercising, lifting weights.  It was noted that EKG performed at that appointment showed heart rate 115 with possible sinus tachycardia.  Per note, "Today's EKG is demonstrating heart rate of 115 bpm looks like possible sinus tachycardia.  I only see a small P waves preceding each QRS complex.  Unusual heart rate however.  Could be some atrial tachycardia.  When I auscultated him his heart rate had slowed down.  Overall feeling well.  No strokes.  No changes with medical management.  Consider aspirin every other day."  He was advised to follow-up in 12 months.  We will need day of surgery labs and evaluation.  EKG 08/03/20: Paroxysmal atrial tachycardia. Rate 115.   TTE 08/27/2017: Conclusions: 1.  Moderate concentric LVH with normal global wall motion.  Doppler evidence of grade 1 diastolic dysfunction.  Estimated EF 55%. 2.  Moderate left atrial enlargement. 3.  Normally functioning prosthetic mitral valve repair with trace regurgitation.  Mildly restricted mitral valve leaflets. 4.  Mild tricuspid regurgitation. 5.  Trace pulmonic regurgitation. 6.  IVC  is dilated with respiratory variation. 7.  Since last echo interval improvement in LVEF.  Right/left heart cath 11/10/2015 (prior to MVR): Mid LAD to Dist LAD lesion, 40% stenosed. The left ventricular systolic function is normal.   1. Nonobstructive CAD 2. Normal LV function 3. Severe MR 4.  Normal right heart and LV filling pressures.  Plan routine monitors, GETA with Exparel interscalene block for post op analgesia  )      Anesthesia Quick Evaluation

## 2020-08-12 ENCOUNTER — Ambulatory Visit (HOSPITAL_COMMUNITY): Payer: Medicare Other | Admitting: Physician Assistant

## 2020-08-12 ENCOUNTER — Ambulatory Visit (HOSPITAL_COMMUNITY)
Admission: RE | Admit: 2020-08-12 | Discharge: 2020-08-12 | Disposition: A | Discharge status: Home / Self Care | Payer: Medicare Other | Attending: Orthopedic Surgery | Admitting: Orthopedic Surgery

## 2020-08-12 ENCOUNTER — Other Ambulatory Visit: Payer: Self-pay

## 2020-08-12 ENCOUNTER — Encounter (HOSPITAL_COMMUNITY)
Admission: RE | Disposition: A | Discharge status: Home / Self Care | Payer: Self-pay | Source: Home / Self Care | Attending: Orthopedic Surgery

## 2020-08-12 ENCOUNTER — Encounter (HOSPITAL_COMMUNITY): Payer: Self-pay | Admitting: Orthopedic Surgery

## 2020-08-12 DIAGNOSIS — Z87891 Personal history of nicotine dependence: Secondary | ICD-10-CM | POA: Insufficient documentation

## 2020-08-12 DIAGNOSIS — Z791 Long term (current) use of non-steroidal anti-inflammatories (NSAID): Secondary | ICD-10-CM | POA: Diagnosis not present

## 2020-08-12 DIAGNOSIS — I10 Essential (primary) hypertension: Secondary | ICD-10-CM | POA: Insufficient documentation

## 2020-08-12 DIAGNOSIS — S42201A Unspecified fracture of upper end of right humerus, initial encounter for closed fracture: Secondary | ICD-10-CM | POA: Diagnosis not present

## 2020-08-12 DIAGNOSIS — E785 Hyperlipidemia, unspecified: Secondary | ICD-10-CM | POA: Diagnosis not present

## 2020-08-12 DIAGNOSIS — G8918 Other acute postprocedural pain: Secondary | ICD-10-CM | POA: Diagnosis not present

## 2020-08-12 DIAGNOSIS — E039 Hypothyroidism, unspecified: Secondary | ICD-10-CM | POA: Diagnosis not present

## 2020-08-12 DIAGNOSIS — I739 Peripheral vascular disease, unspecified: Secondary | ICD-10-CM | POA: Diagnosis not present

## 2020-08-12 DIAGNOSIS — W19XXXA Unspecified fall, initial encounter: Secondary | ICD-10-CM | POA: Insufficient documentation

## 2020-08-12 DIAGNOSIS — Z79899 Other long term (current) drug therapy: Secondary | ICD-10-CM | POA: Insufficient documentation

## 2020-08-12 DIAGNOSIS — Z79890 Hormone replacement therapy (postmenopausal): Secondary | ICD-10-CM | POA: Insufficient documentation

## 2020-08-12 DIAGNOSIS — Z8546 Personal history of malignant neoplasm of prostate: Secondary | ICD-10-CM | POA: Insufficient documentation

## 2020-08-12 DIAGNOSIS — Z7982 Long term (current) use of aspirin: Secondary | ICD-10-CM | POA: Insufficient documentation

## 2020-08-12 DIAGNOSIS — Z96611 Presence of right artificial shoulder joint: Secondary | ICD-10-CM

## 2020-08-12 DIAGNOSIS — S42291A Other displaced fracture of upper end of right humerus, initial encounter for closed fracture: Secondary | ICD-10-CM | POA: Diagnosis not present

## 2020-08-12 HISTORY — DX: Other specified postprocedural states: R11.2

## 2020-08-12 HISTORY — DX: Other specified postprocedural states: Z98.890

## 2020-08-12 HISTORY — DX: Cardiac murmur, unspecified: R01.1

## 2020-08-12 HISTORY — PX: REVERSE SHOULDER ARTHROPLASTY: SHX5054

## 2020-08-12 LAB — SURGICAL PCR SCREEN
MRSA, PCR: NEGATIVE
Staphylococcus aureus: NEGATIVE

## 2020-08-12 LAB — BASIC METABOLIC PANEL
Anion gap: 8 (ref 5–15)
BUN: 18 mg/dL (ref 8–23)
CO2: 24 mmol/L (ref 22–32)
Calcium: 8.6 mg/dL — ABNORMAL LOW (ref 8.9–10.3)
Chloride: 106 mmol/L (ref 98–111)
Creatinine, Ser: 1.3 mg/dL — ABNORMAL HIGH (ref 0.61–1.24)
GFR, Estimated: 54 mL/min — ABNORMAL LOW (ref >60)
Glucose, Bld: 131 mg/dL — ABNORMAL HIGH (ref 70–99)
Potassium: 3.7 mmol/L (ref 3.5–5.1)
Sodium: 138 mmol/L (ref 135–145)

## 2020-08-12 LAB — CBC
HCT: 33.3 % — ABNORMAL LOW (ref 39.0–52.0)
Hemoglobin: 10.8 g/dL — ABNORMAL LOW (ref 13.0–17.0)
MCH: 30.7 pg (ref 26.0–34.0)
MCHC: 32.4 g/dL (ref 30.0–36.0)
MCV: 94.6 fL (ref 80.0–100.0)
Platelets: 189 10*3/uL (ref 150–400)
RBC: 3.52 MIL/uL — ABNORMAL LOW (ref 4.22–5.81)
RDW: 13.3 % (ref 11.5–15.5)
WBC: 5.9 10*3/uL (ref 4.0–10.5)
nRBC: 0 % (ref 0.0–0.2)

## 2020-08-12 SURGERY — ARTHROPLASTY, SHOULDER, TOTAL, REVERSE
Anesthesia: General | Site: Shoulder | Laterality: Right

## 2020-08-12 MED ORDER — CHLORHEXIDINE GLUCONATE 0.12 % MT SOLN
OROMUCOSAL | Status: AC
  Administered 2020-08-12: 15 mL via OROMUCOSAL
  Filled 2020-08-12: qty 15

## 2020-08-12 MED ORDER — TRANEXAMIC ACID-NACL 1000-0.7 MG/100ML-% IV SOLN
1000.0000 mg | INTRAVENOUS | Status: AC
  Administered 2020-08-12: 1000 mg via INTRAVENOUS
  Filled 2020-08-12: qty 100

## 2020-08-12 MED ORDER — BUPIVACAINE-EPINEPHRINE (PF) 0.5% -1:200000 IJ SOLN
INTRAMUSCULAR | Status: DC | PRN
  Administered 2020-08-12: 10 mL via PERINEURAL

## 2020-08-12 MED ORDER — OXYCODONE HCL 5 MG/5ML PO SOLN: 5.0000 mg | Freq: Once | ORAL | Status: DC | PRN

## 2020-08-12 MED ORDER — FENTANYL CITRATE (PF) 100 MCG/2ML IJ SOLN
INTRAMUSCULAR | Status: AC
  Administered 2020-08-12: 100 ug via INTRAVENOUS
  Filled 2020-08-12: qty 2

## 2020-08-12 MED ORDER — CHLORHEXIDINE GLUCONATE 0.12 % MT SOLN: 15.0000 mL | Freq: Once | OROMUCOSAL | Status: AC

## 2020-08-12 MED ORDER — BUPIVACAINE LIPOSOME 1.3 % IJ SUSP
INTRAMUSCULAR | Status: DC | PRN
  Administered 2020-08-12: 10 mL via PERINEURAL

## 2020-08-12 MED ORDER — FENTANYL CITRATE (PF) 100 MCG/2ML IJ SOLN: 25.0000 ug | INTRAMUSCULAR | Status: DC | PRN

## 2020-08-12 MED ORDER — ORAL CARE MOUTH RINSE: 15.0000 mL | Freq: Once | OROMUCOSAL | Status: AC

## 2020-08-12 MED ORDER — LIDOCAINE 2% (20 MG/ML) 5 ML SYRINGE
INTRAMUSCULAR | Status: DC | PRN
  Administered 2020-08-12: 20 mg via INTRAVENOUS

## 2020-08-12 MED ORDER — ONDANSETRON HCL 4 MG/2ML IJ SOLN
INTRAMUSCULAR | Status: DC | PRN
  Administered 2020-08-12: 4 mg via INTRAVENOUS

## 2020-08-12 MED ORDER — PROPOFOL 10 MG/ML IV BOLUS
INTRAVENOUS | Status: DC | PRN
  Administered 2020-08-12: 80 mg via INTRAVENOUS

## 2020-08-12 MED ORDER — LACTATED RINGERS IV SOLN: INTRAVENOUS | Status: DC

## 2020-08-12 MED ORDER — MIDAZOLAM HCL 2 MG/2ML IJ SOLN
INTRAMUSCULAR | Status: AC
  Filled 2020-08-12: qty 2

## 2020-08-12 MED ORDER — ROCURONIUM BROMIDE 10 MG/ML (PF) SYRINGE
PREFILLED_SYRINGE | INTRAVENOUS | Status: DC | PRN
  Administered 2020-08-12: 60 mg via INTRAVENOUS

## 2020-08-12 MED ORDER — 0.9 % SODIUM CHLORIDE (POUR BTL) OPTIME
TOPICAL | Status: DC | PRN
  Administered 2020-08-12: 1000 mL

## 2020-08-12 MED ORDER — PHENYLEPHRINE HCL-NACL 10-0.9 MG/250ML-% IV SOLN
INTRAVENOUS | Status: DC | PRN
  Administered 2020-08-12: 20 ug/min via INTRAVENOUS

## 2020-08-12 MED ORDER — OXYCODONE HCL 5 MG PO TABS: 5.0000 mg | ORAL_TABLET | Freq: Once | ORAL | Status: DC | PRN

## 2020-08-12 MED ORDER — PROPOFOL 10 MG/ML IV BOLUS
INTRAVENOUS | Status: AC
  Filled 2020-08-12: qty 20

## 2020-08-12 MED ORDER — ONDANSETRON HCL 4 MG/2ML IJ SOLN
INTRAMUSCULAR | Status: AC
  Filled 2020-08-12: qty 2

## 2020-08-12 MED ORDER — SUGAMMADEX SODIUM 200 MG/2ML IV SOLN
INTRAVENOUS | Status: DC | PRN
  Administered 2020-08-12: 150 mg via INTRAVENOUS

## 2020-08-12 MED ORDER — HYDROCODONE-ACETAMINOPHEN 5-325 MG PO TABS
1.0000 | ORAL_TABLET | ORAL | 0 refills | Status: DC | PRN
Start: 2020-08-12 — End: 2020-08-13

## 2020-08-12 MED ORDER — LIDOCAINE 2% (20 MG/ML) 5 ML SYRINGE
INTRAMUSCULAR | Status: AC
  Filled 2020-08-12: qty 5

## 2020-08-12 MED ORDER — DEXAMETHASONE SODIUM PHOSPHATE 10 MG/ML IJ SOLN
INTRAMUSCULAR | Status: DC | PRN
  Administered 2020-08-12: 5 mg via INTRAVENOUS

## 2020-08-12 MED ORDER — DEXAMETHASONE SODIUM PHOSPHATE 10 MG/ML IJ SOLN
INTRAMUSCULAR | Status: AC
  Filled 2020-08-12: qty 1

## 2020-08-12 MED ORDER — CEFAZOLIN SODIUM-DEXTROSE 2-4 GM/100ML-% IV SOLN
2.0000 g | INTRAVENOUS | Status: AC
  Administered 2020-08-12: 2 g via INTRAVENOUS
  Filled 2020-08-12: qty 100

## 2020-08-12 MED ORDER — FENTANYL CITRATE (PF) 100 MCG/2ML IJ SOLN: 100.0000 ug | Freq: Once | INTRAMUSCULAR | Status: AC

## 2020-08-12 MED ORDER — ONDANSETRON HCL 4 MG PO TABS: 4.0000 mg | ORAL_TABLET | Freq: Three times a day (TID) | ORAL | 0 refills | Status: DC | PRN

## 2020-08-12 MED ORDER — EPHEDRINE SULFATE-NACL 50-0.9 MG/10ML-% IV SOSY
PREFILLED_SYRINGE | INTRAVENOUS | Status: DC | PRN
  Administered 2020-08-12 (×2): 5 mg via INTRAVENOUS
  Administered 2020-08-12: 10 mg via INTRAVENOUS

## 2020-08-12 MED ORDER — ROCURONIUM BROMIDE 10 MG/ML (PF) SYRINGE
PREFILLED_SYRINGE | INTRAVENOUS | Status: AC
  Filled 2020-08-12: qty 10

## 2020-08-12 MED ORDER — FENTANYL CITRATE (PF) 250 MCG/5ML IJ SOLN
INTRAMUSCULAR | Status: AC
  Filled 2020-08-12: qty 5

## 2020-08-12 SURGICAL SUPPLY — 63 items
BLADE SAW SGTL 83.5X18.5 (BLADE) ×3 IMPLANT
COVER SURGICAL LIGHT HANDLE (MISCELLANEOUS) ×3 IMPLANT
COVER WAND RF STERILE (DRAPES) ×3 IMPLANT
CUP SUT UNIV REVERS 39 NEU (Shoulder) ×3 IMPLANT
DERMABOND ADVANCED (GAUZE/BANDAGES/DRESSINGS) ×2
DERMABOND ADVANCED .7 DNX12 (GAUZE/BANDAGES/DRESSINGS) ×1 IMPLANT
DRAPE ORTHO SPLIT 77X108 STRL (DRAPES) ×4
DRAPE SURG 17X11 SM STRL (DRAPES) ×3 IMPLANT
DRAPE SURG ORHT 6 SPLT 77X108 (DRAPES) ×2 IMPLANT
DRAPE U-SHAPE 47X51 STRL (DRAPES) ×3 IMPLANT
DRSG AQUACEL AG ADV 3.5X10 (GAUZE/BANDAGES/DRESSINGS) ×3 IMPLANT
DURAPREP 26ML APPLICATOR (WOUND CARE) ×3 IMPLANT
ELECT BLADE 4.0 EZ CLEAN MEGAD (MISCELLANEOUS) ×3
ELECT CAUTERY BLADE 6.4 (BLADE) ×3 IMPLANT
ELECT REM PT RETURN 9FT ADLT (ELECTROSURGICAL) ×3
ELECTRODE BLDE 4.0 EZ CLN MEGD (MISCELLANEOUS) ×1 IMPLANT
ELECTRODE REM PT RTRN 9FT ADLT (ELECTROSURGICAL) ×1 IMPLANT
FACESHIELD WRAPAROUND (MASK) ×9 IMPLANT
GLENOID UNI REV MOD 24 +2 LAT (Joint) ×3 IMPLANT
GLENOSPHERE 39+4 LAT/24 UNI RV (Joint) ×3 IMPLANT
GLOVE BIO SURGEON STRL SZ7.5 (GLOVE) ×3 IMPLANT
GLOVE BIO SURGEON STRL SZ8 (GLOVE) ×3 IMPLANT
GLOVE EUDERMIC 7 POWDERFREE (GLOVE) ×3 IMPLANT
GLOVE SS BIOGEL STRL SZ 7.5 (GLOVE) ×1 IMPLANT
GLOVE SUPERSENSE BIOGEL SZ 7.5 (GLOVE) ×2
GOWN STRL REUS W/ TWL LRG LVL3 (GOWN DISPOSABLE) ×1 IMPLANT
GOWN STRL REUS W/ TWL XL LVL3 (GOWN DISPOSABLE) ×2 IMPLANT
GOWN STRL REUS W/TWL LRG LVL3 (GOWN DISPOSABLE) ×2
GOWN STRL REUS W/TWL XL LVL3 (GOWN DISPOSABLE) ×4
INSERT HUMERAL MED 39/ +3 (Shoulder) ×1 IMPLANT
INSERT MEDIUM HUMERAL 39/ +3 (Shoulder) ×2 IMPLANT
KIT BASIN OR (CUSTOM PROCEDURE TRAY) ×3 IMPLANT
KIT TURNOVER KIT B (KITS) ×3 IMPLANT
MANIFOLD NEPTUNE II (INSTRUMENTS) ×3 IMPLANT
NEEDLE TAPERED W/ NITINOL LOOP (MISCELLANEOUS) ×3 IMPLANT
NS IRRIG 1000ML POUR BTL (IV SOLUTION) ×3 IMPLANT
PACK SHOULDER (CUSTOM PROCEDURE TRAY) ×3 IMPLANT
PAD ARMBOARD 7.5X6 YLW CONV (MISCELLANEOUS) ×6 IMPLANT
PIN SET MODULAR GLENOID SYSTEM (PIN) ×3 IMPLANT
RESTRAINT HEAD UNIVERSAL NS (MISCELLANEOUS) ×3 IMPLANT
SCREW CENTRAL MOD 30MM (Screw) ×3 IMPLANT
SCREW PERI LOCK 5.5X24 (Screw) ×9 IMPLANT
SCREW PERIPHERAL 5.5X20 LOCK (Screw) ×3 IMPLANT
SLING ARM FOAM STRAP LRG (SOFTGOODS) ×3 IMPLANT
SPACER SHLD UNI REV 39 +15 (Spacer) ×2 IMPLANT
SPACER UNIVERS REVERS 39 + 15 (Spacer) ×1 IMPLANT
SPONGE LAP 18X18 RF (DISPOSABLE) ×3 IMPLANT
SPONGE LAP 4X18 RFD (DISPOSABLE) ×3 IMPLANT
STEM HUMERAL UNI REV SIZE 12 (Stem) ×3 IMPLANT
SUT FIBERWIRE #2 38 REV NDL BL (SUTURE) ×6
SUT MNCRL AB 3-0 PS2 18 (SUTURE) ×3 IMPLANT
SUT MON AB 2-0 CT1 27 (SUTURE) IMPLANT
SUT MON AB 2-0 CT1 36 (SUTURE) ×3 IMPLANT
SUT TIGERTAPE CERCLAGE (Miscellaneous) ×3 IMPLANT
SUT VIC AB 1 CT1 27 (SUTURE) ×2
SUT VIC AB 1 CT1 27XBRD ANBCTR (SUTURE) ×1 IMPLANT
SUTURE FIBERWR#2 38 REV NDL BL (SUTURE) ×2 IMPLANT
SUTURE TAPE 1.3 40 TPR END (SUTURE) ×1 IMPLANT
SUTURE TAPE TIGERLINK 1.3MM BL (SUTURE) ×1 IMPLANT
SUTURETAPE 1.3 40 TPR END (SUTURE) ×3
SUTURETAPE TIGERLINK 1.3MM BL (SUTURE) ×3
TOWEL GREEN STERILE (TOWEL DISPOSABLE) ×3 IMPLANT
WATER STERILE IRR 1000ML POUR (IV SOLUTION) ×3 IMPLANT

## 2020-08-12 NOTE — Anesthesia Procedure Notes (Signed)
Anesthesia Regional Block: Interscalene brachial plexus block   Pre-Anesthetic Checklist: ,, timeout performed, Correct Patient, Correct Site, Correct Laterality, Correct Procedure, Correct Position, site marked, Risks and benefits discussed,  Surgical consent,  Pre-op evaluation,  At surgeon's request and post-op pain management  Laterality: Right and Upper  Prep: chloraprep       Needles:  Injection technique: Single-shot  Needle Type: Echogenic Needle     Needle Length: 9cm  Needle Gauge: 21     Additional Needles:   Procedures:,,,, ultrasound used (permanent image in chart),,,,  Narrative:  Start time: 08/12/2020 10:17 AM End time: 08/12/2020 10:24 AM Injection made incrementally with aspirations every 5 mL.  Performed by: Personally  Anesthesiologist: Annye Asa, MD  Additional Notes: Pt identified in Holding room.  Monitors applied. Working IV access confirmed. Sterile prep R neck.  #21ga ECHOgenic needle to interscalene brach plexus with US guidance.  Heme asp, needle repositioned to neg aspiration and confirmed prox to brach plexus, 10cc 0.5% Bupivacaine with 1:200k epi, exparel injected incrementally after negative test dose.  Patient asymptomatic, VSS, no additional heme aspirated, tolerated well.  Jenita Seashore, MD

## 2020-08-12 NOTE — H&P (Signed)
Samuel [name]    Chief Complaint: Right proximal humerus fracture HPI: The patient is a 86 y.o. male status post mechanical fall with a severely impacted and angulated right proximal humerus fracture.  Due to the degree of displacement the patient is brought to the operating this time for planned reverse shoulder arthroplasty.  Past Medical History:  Diagnosis Date  . Aortoiliac occlusive disease ([place])   . Carotid artery disease ([place])   . Complication of anesthesia 1955   sodium pentathol ?, N/V  . Contracture of palmar fascia 06/21/2010  . Cortical senile cataract 06/27/2010  . Dysrhythmia    PAF  . Enthesopathy of hip region 11/27/2009  . Essential hypertension, benign 06/21/2010  . GERD (gastroesophageal reflux disease)    occ  . Head injury with loss of consciousness ([place])    short peiod  . Heart murmur   . Hyperlipidemia LDL goal <100 06/21/2010  . Hypertrophy of prostate without urinary obstruction and other lower urinary tract symptoms (LUTS) 06/21/2010  . Hypothyroidism 08/27/2011  . Malignant neoplasm of prostate ([place]) 06/21/2010  . Mitral regurgitation 10/07/2015   Severe MR noted on ECHO with partially flail post leaflet Oct 03 2015   . Nocturia 2011  . Osteoarthrosis, unspecified whether generalized or localized, unspecified site 06/21/2010  . Other and unspecified hyperlipidemia 06/21/2010  . Pain in joint, lower leg 02/25/2012  . Palpitations 08/14/2010  . Patent foramen ovale 01/04/2016   Closed at the time of mitral valve repair   . PONV (postoperative nausea and vomiting)   . S/P minimally invasive mitral valve repair 01/04/2016   Complex valvuloplasty including triangular resection of flail segment of posterior leaflet, artificial Gore-tex neochord placement x6 and 35mm Sorin Memo 3D ring annuloplasty via right mini thoracotomy approach with closure of PFO and clipping of LA appendage   . Unspecified constipation 06/21/2010  . Unspecified essential hypertension 06/21/2010  .  Unspecified hypothyroidism 08/27/2011  . Urinary frequency 08/14/2008    Past Surgical History:  Procedure Laterality Date  . CARDIAC CATHETERIZATION N/A 11/10/2015   Procedure: Right/Left Heart Cath and Coronary Angiography;  Surgeon: Peter M Martinique, MD;  Location: [facility];  Service: Cardiovascular;  Laterality: N/A;  . CATARACT EXTRACTION W/ INTRAOCULAR LENS  IMPLANT, BILATERAL  2006  . CLIPPING OF ATRIAL APPENDAGE N/A 01/04/2016   Procedure: CLIPPING OF ATRIAL APPENDAGE;  Surgeon: Rexene Alberts, MD;  Location: [facility];  Service: Open Heart Surgery;  Laterality: N/A;  . COLONOSCOPY    . KNEE ARTHROSCOPY WITH MENISCAL REPAIR Left 1968  . MITRAL VALVE REPAIR Right 01/04/2016   Procedure: MINIMALLY INVASIVE MITRAL VALVE REPAIR (MVR);  Surgeon: Rexene Alberts, MD;  Location: [place];  Service: Open Heart Surgery;  Laterality: Right;  . Open knee- ligament repair Right 1955   Dr. Ethel Rana  . PROSTATE SURGERY  2000   seed implants  . TEE WITHOUT CARDIOVERSION N/A 11/10/2015   Procedure: TRANSESOPHAGEAL ECHOCARDIOGRAM (TEE);  Surgeon: Skeet Latch, MD;  Location: [facility];  Service: Cardiovascular;  Laterality: N/A;  . TEE WITHOUT CARDIOVERSION N/A 01/04/2016   Procedure: TRANSESOPHAGEAL ECHOCARDIOGRAM (TEE);  Surgeon: Rexene Alberts, MD;  Location: [facility];  Service: Open Heart Surgery;  Laterality: N/A;    Family History  Problem Relation Age of Onset  . Heart disease Mother        myocardial infarction  . Heart disease Father        myocardial infarction    Social History:  reports that he  quit smoking about 50 years ago. His smoking use included cigarettes and pipe. He has a 2.50 pack-year smoking history. He has never used smokeless tobacco. He reports current alcohol use of about 14.0 standard drinks of alcohol per week. He reports that he does not use drugs.   Medications Prior to Admission  Medication Sig Dispense Refill  . acetaminophen (TYLENOL) 500 MG tablet Take  1,000 mg by mouth every 6 (six) hours as needed for mild pain.    Marland Kitchen alfuzosin (UROXATRAL) 10 MG 24 hr tablet Take 10 mg by mouth daily with breakfast. to reduce bladder spasm    . aspirin 81 MG tablet Take 81 mg by mouth every other day. for anticoagulation    . losartan (COZAAR) 25 MG tablet Take 1 tablet (25 mg total) by mouth daily. 90 tablet 3  . metoprolol succinate (TOPROL-XL) 25 MG 24 hr tablet Take 12.5 mg by mouth daily.    . nabumetone (RELAFEN) 750 MG tablet TAKE 1 TABLET TWICE A DAY (Patient taking differently: Take 750 mg by mouth 2 (two) times daily. ) 180 tablet 3  . simvastatin (ZOCOR) 40 MG tablet Take 1 tablet (40 mg total) by mouth daily at 6 PM. (Patient taking differently: Take 20 mg by mouth daily at 6 PM. ) 90 tablet 3  . TIROSINT 50 MCG CAPS TAKE 1 CAPSULE DAILY BEFORE BREAKFAST (Patient taking differently: Take 50 mcg by mouth daily. ) 90 capsule 3  . VESICARE 5 MG tablet Take 5 mg by mouth daily.     . furosemide (LASIX) 40 MG tablet Take 1 tablet (40 mg total) by mouth daily. (Patient not taking: Reported on 08/11/2020) 30 tablet 5  . ibuprofen (ADVIL) 200 MG tablet Take 400 mg by mouth every 6 (six) hours as needed for mild pain or moderate pain.       Physical Exam: Inspection of the right foot demonstrates diffuse swelling.  He is neurovascular intact in the right lower extremity.  Skin is intact.  Plain radiographs of the right shoulder demonstrate a severely impacted and angulated proximal humeral fracture.  Vitals  Temp:  [97.5 F (36.4 C)] 97.5 F (36.4 C) (10/22 0806) Pulse Rate:  [70] 70 (10/22 0806) Resp:  [18] 18 (10/22 0806) BP: (147)/(73) 147/73 (10/22 0806) SpO2:  [97 %] 97 % (10/22 0806) Weight:  [72.7 kg] 72.7 kg (10/22 0806)  Assessment/Plan  Impression: Right proximal humerus fracture  Plan of Action: Procedure(s): REVERSE SHOULDER ARTHROPLASTY  Deirdre Gryder M Chaslyn Eisen 08/12/2020, 10:16 AM Contact # [phone]

## 2020-08-12 NOTE — Transfer of Care (Signed)
Immediate Anesthesia Transfer of Care Note  Patient: Samuel [name]  Procedure(s) Performed: REVERSE SHOULDER ARTHROPLASTY (Right Shoulder)  Patient Location: PACU  Anesthesia Type:GA combined with regional for post-op pain  Level of Consciousness: awake and alert   Airway & Oxygen Therapy: Patient Spontanous Breathing and Patient connected to face mask oxygen  Post-op Assessment: Report given to RN and Post -op Vital signs reviewed and stable  Post vital signs: Reviewed and stable  Last Vitals:  Vitals Value Taken Time  BP 122/67 08/12/20 1327  Temp 36.5 C 08/12/20 1315  Pulse 66 08/12/20 1332  Resp 15 08/12/20 1332  SpO2 94 % 08/12/20 1332  Vitals shown include unvalidated device data.  Last Pain:  Vitals:   08/12/20 1315  TempSrc:   PainSc: 0-No pain      Patients Stated Pain Goal: 4 (38/33/38 3291)  Complications: No complications documented.

## 2020-08-12 NOTE — Op Note (Addendum)
08/12/2020  12:44 PM  PATIENT:   Samuel [name]  86 y.o. male  PRE-OPERATIVE DIAGNOSIS: Severely displaced and angulated right proximal humerus fracture  POST-OPERATIVE DIAGNOSIS: Same  PROCEDURE: Right shoulder reverse arthroplasty utilizing a press-fit size 12 Arthrex stem with a +15 spacer, +3 poly-, 39/+4 glenosphere on a small/+2 baseplate  SURGEON:  Tylen Leverich, Metta Clines M.D.  ASSISTANTS: Jenetta Loges, PA-C  ANESTHESIA:   General endotracheal and interscalene block with Exparel  EBL: 300 cc  SPECIMEN: None  Drains: None   PATIENT DISPOSITION:  PACU - hemodynamically stable.    PLAN OF CARE: Admit for overnight observation  Brief history:  Samuel [name] is an 86 year old gentleman who sustained a ground-level fall earlier this week with immediate complaints of right shoulder pain and progressive swelling.  He was evaluated in our office where plain radiographs confirmed a severely displaced and angulated proximal humeral fracture with significant comminution.  He is brought to the operating this time for planned right shoulder reverse arthroplasty.  Preoperatively, I counseled the patient regarding treatment options and risks versus benefits thereof.  Possible surgical complications were all reviewed including potential for bleeding, infection, neurovascular injury, persistent pain, loss of motion, anesthetic complication, failure of the implant, and possible need for additional surgery. They understand and accept and agrees with our planned procedure.   Procedure in detail:  After undergoing routine preop evaluation the patient received prophylactic antibiotics and an interscalene block with Exparel was established in the holding area by the anesthesia department.  Patient was subsequently placed supine on the operating table and underwent the smooth induction of a general endotracheal anesthesia.  Placed into the beachchair position and appropriately padded and protected.   The right shoulder girdle region was sterilely prepped and draped in standard fashion.  Timeout was called.  An anterior deltopectoral approach to the right shoulder is made through a 10 cm incision.  Skin flaps were elevated dissection carried deeply and significant hemorrhage was noted throughout the anterior muscular planes.  Identification of the deltopectoral interval was difficult due to the hemorrhage and hematoma.  Ultimately the interval was identified and developed from proximal to distal and the vein had been disrupted as part of the original traumatic injury.  Electrocautery was used to achieve hemostasis.  At this point the fracture site was readily identified with the humeral shaft having been displaced proximally and medially.  The long head biceps tendon was identified and divided for later tenodesis and this was used to help identify the interval between the tuberosities.  An osteotome was then used to was split the lesser tuberosity away from the major articular fragment and we then tagged the bone tendon junction of the lesser tuberosity with FiberWire sutures x2.  We then used the osteotome to divide the articular segment from the greater tuberosity and once this was completed we had appropriate size greater tuberosity bone fragment we placed a series of 4 grasping sutures into the bone tendon junction of the superior and posterior rotator cuff insertions into the tuberosity.  Tuberosities were then mobilized and this allowed visualization of the glenoid which was exposed with appropriate retractors.  A circumferential labral resection was completed.  Guidepin was then directed into the center of the glenoid and the glenoid was prepared reaming with the central followed by the peripheral reamers.  Terminal preparation was completed with the drill and tap and a 30 mm lag screw was selected.  A baseplate was assembled and inserted into the glenoid with excellent fit  and fixation.  All of the  peripheral locking screws were then placed with good fixation.  The 39/+4 glenosphere was then impacted onto the baseplate and the central locking screw was placed.  We then returned our attention back to the humeral shaft where the canal was opened hand reaming and then broached up to a size 12 at approximate 20 degrees retroversion.  Trial reduction showed good soft tissue balance.  The final size 12 implant was then assembled and impacted in the humeral canal with good fit and fixation.  We then performed a series of trial reductions and ultimately felt that a 15 mm spacer and a 3 mm polygave [place] appropriate soft tissue balance.  The final spacer and polywere then inserted and final reduction was then performed.  We then reduced the tuberosities and passed a cerclage tape around the humeral metaphysis to help utilized as a fixation point for connecting the tuberosities to the shaft.  I also placed a suture through the medial eyelid on the stem of her implant and this was used as a circumferential suture around both tuberosities with the suture being passed through the bone tendon injection of both the lesser and greater tuberosities.  We then used the previously placed sutures to reduce the greater and lesser tuberosities to the appropriate positions about the implant and the sutures were then used to tie the tuberosities to one another and also to the limbs of the cerclage placed around the humeral shaft.  Ultimately were very pleased with the overall apposition and reduction of the tuberosities around the metaphyseal region about the implant.  Good fixation was achieved.  We are very pleased with the overall construct.  At completion we also performed a tenodesis of the long head biceps tendon utilizing the sutures that had been utilized for the tuberosity reduction.  Final irrigation was then completed.  Hemostasis was obtained.  The deltopectoral interval was reapproximated with a series of figure-of-eight and  1 Vicryl sutures.  2-0 Vicryl used for subcu layer and intracuticular 3 Monocryl for the skin followed by Dermabond and Aquasol dressing.  Right arm was placed in a sling the patient was then awakened, extubated, and taken to the recovery room in stable condition.  Jenetta Loges, PA-C was utilized as an Environmental consultant throughout this case, essential for help with positioning the patient, positioning extremity, tissue manipulation, implantation of the prosthesis, suture management, wound closure, and intraoperative decision-making.  Postop plan sling immobilization, nonweightbearing, reverse shoulder protocol for fracture.  Plan for overnight observation with discharge home tomorrow.  Marin Shutter MD   Contact # [phone]

## 2020-08-12 NOTE — Anesthesia Procedure Notes (Signed)
Procedure Name: Intubation Date/Time: 08/12/2020 10:49 AM Performed by: Reece Agar, CRNA Pre-anesthesia Checklist: Patient identified, Emergency Drugs available, Suction available and Patient being monitored Patient Re-evaluated:Patient Re-evaluated prior to induction Oxygen Delivery Method: Circle System Utilized Preoxygenation: Pre-oxygenation with 100% oxygen Induction Type: IV induction Ventilation: Mask ventilation without difficulty Laryngoscope Size: Mac Grade View: Grade I Tube type: Oral Tube size: 7.5 mm Number of attempts: 1 Airway Equipment and Method: Stylet Placement Confirmation: ETT inserted through vocal cords under direct vision,  positive ETCO2 and breath sounds checked- equal and bilateral Secured at: 22 cm Tube secured with: Tape Dental Injury: Teeth and Oropharynx as per pre-operative assessment

## 2020-08-12 NOTE — Discharge Instructions (Signed)
 Kevin M. Supple, M.D., F.A.A.O.S. Orthopaedic Surgery Specializing in Arthroscopic and Reconstructive Surgery of the Shoulder [phone] [address] - [place], [place] [address] - Fax [phone]   POST-OP TOTAL SHOULDER REPLACEMENT INSTRUCTIONS  1. Follow up in the office for your first post-op appointment 10-14 days from the date of your surgery. If you do not already have a scheduled appointment, our office will contact you to schedule.  2. The bandage over your incision is waterproof. You may begin showering with this dressing on. You may leave this dressing on until first follow up appointment within 2 weeks. We prefer you leave this dressing in place until follow up however after 5-7 days if you are having itching or skin irritation and would like to remove it you may do so. Go slow and tug at the borders gently to break the bond the dressing has with the skin. At this point if there is no drainage it is okay to go without a bandage or you may cover it with a light guaze and tape. You can also expect significant bruising around your shoulder that will drift down your arm and into your chest wall. This is very normal and should resolve over several days.   3. Wear your sling/immobilizer at all times except to perform the exercises below or to occasionally let your arm dangle by your side to stretch your elbow. You also need to sleep in your sling immobilizer until instructed otherwise. It is ok to remove your sling if you are sitting in a controlled environment and allow your arm to rest in a position of comfort by your side or on your lap with pillows to give your neck and skin a break from the sling. You may remove it to allow arm to dangle by side to shower. If you are up walking around and when you go to sleep at night you need to wear it.  4. Range of motion to your elbow, wrist, and hand are encouraged 3-5 times daily. Exercise to your hand and fingers helps to reduce  swelling you may experience.   5. Prescriptions for a pain medication and a muscle relaxant are provided for you. It is recommended that if you are experiencing pain that you pain medication alone is not controlling, add the muscle relaxant along with the pain medication which can give additional pain relief. The first 1-2 days is generally the most severe of your pain and then should gradually decrease. As your pain lessens it is recommended that you decrease your use of the pain medications to an "as needed basis'" only and to always comply with the recommended dosages of the pain medications.  6. Pain medications can produce constipation along with their use. If you experience this, the use of an over the counter stool softener or laxative daily is recommended.   7. For additional questions or concerns, please do not hesitate to call the office. If after hours there is an answering service to forward your concerns to the physician on call.  8.Pain control following an exparel block  To help control your post-operative pain you received a nerve block  performed with Exparel which is a long acting anesthetic (numbing agent) which can provide pain relief and sensations of numbness (and relief of pain) in the operative shoulder and arm for up to 3 days. Sometimes it provides mixed relief, meaning you may still have numbness in certain areas of the arm but can still be able to   move  parts of that arm, hand, and fingers. We recommend that your prescribed pain medications  be used as needed. We do not feel it is necessary to "pre medicate" and "stay ahead" of pain.  Taking narcotic pain medications when you are not having any pain can lead to unnecessary and potentially dangerous side effects.    9. Use the ice machine as much as possible in the first 5-7 days from surgery, then you can wean its use to as needed. The ice typically needs to be replaced every 6 hours, instead of ice you can actually freeze  water bottles to put in the cooler and then fill water around them to avoid having to purchase ice. You can have spare water bottles freezing to allow you to rotate them once they have melted. Try to have a thin shirt or light cloth or towel under the ice wrap to protect your skin.   10.  We recommend that you avoid any dental work or cleaning in the first 3 months following your joint replacement. This is to help minimize the possibility of infection from the bacteria in your mouth that enters your bloodstream during dental work. We also recommend that you take an antibiotic prior to your dental work for the first year after your shoulder replacement to further help reduce that risk. Please simply contact our office for antibiotics to be sent to your pharmacy prior to dental work.  11. Dental Antibiotics:  In most cases prophylactic antibiotics for Dental procdeures after total joint surgery are not necessary.  Exceptions are as follows:  1. History of prior total joint infection  2. Severely immunocompromised (Organ Transplant, cancer chemotherapy, Rheumatoid biologic meds such as [place])  3. Poorly controlled diabetes (A1C &gt; 8.0, blood glucose over 200)  If you have one of these conditions, contact your surgeon for an antibiotic prescription, prior to your dental procedure.   POST-OP EXERCISES  Ok to allow arm out of sling for showers, hygiene and to move elbow wrist and hand

## 2020-08-12 NOTE — Anesthesia Postprocedure Evaluation (Signed)
Anesthesia Post Note  Patient: Samuel [name]  Procedure(s) Performed: REVERSE SHOULDER ARTHROPLASTY (Right Shoulder)     Patient location during evaluation: PACU Anesthesia Type: General and Regional Level of consciousness: awake and alert, patient cooperative and oriented Pain management: pain level controlled Vital Signs Assessment: post-procedure vital signs reviewed and stable Respiratory status: spontaneous breathing, nonlabored ventilation and respiratory function stable Cardiovascular status: blood pressure returned to baseline and stable Postop Assessment: no apparent nausea or vomiting Anesthetic complications: no   No complications documented.  Last Vitals:  Vitals:   08/12/20 0806 08/12/20 1315  BP: (!) 147/73 (!) 145/68  Pulse: 70 68  Resp: 18 16  Temp: (!) 36.4 C 36.5 C  SpO2: 97% 95%    Last Pain:  Vitals:   08/12/20 1315  TempSrc:   PainSc: 0-No pain                 Junnie Loschiavo,E. Allon Costlow

## 2020-08-13 ENCOUNTER — Other Ambulatory Visit: Payer: Self-pay | Admitting: Internal Medicine

## 2020-08-13 MED ORDER — HYDROCODONE-ACETAMINOPHEN 5-325 MG PO TABS: 1.0000 | ORAL_TABLET | ORAL | 0 refills | Status: DC | PRN

## 2020-08-13 NOTE — Progress Notes (Signed)
Hydrocodone /Acetaminophen 5/325 mg 20 tabs ordered as request of Nurse in [place]

## 2020-08-16 ENCOUNTER — Encounter (HOSPITAL_COMMUNITY): Payer: Self-pay | Admitting: Orthopedic Surgery

## 2020-08-17 DIAGNOSIS — Z8546 Personal history of malignant neoplasm of prostate: Secondary | ICD-10-CM | POA: Diagnosis not present

## 2020-08-19 DIAGNOSIS — Z471 Aftercare following joint replacement surgery: Secondary | ICD-10-CM | POA: Diagnosis not present

## 2020-08-19 DIAGNOSIS — Z96611 Presence of right artificial shoulder joint: Secondary | ICD-10-CM | POA: Diagnosis not present

## 2020-08-24 ENCOUNTER — Encounter: Payer: Self-pay | Admitting: Family

## 2020-08-24 DIAGNOSIS — N401 Enlarged prostate with lower urinary tract symptoms: Secondary | ICD-10-CM | POA: Diagnosis not present

## 2020-08-24 DIAGNOSIS — Z8546 Personal history of malignant neoplasm of prostate: Secondary | ICD-10-CM | POA: Diagnosis not present

## 2020-08-24 DIAGNOSIS — R350 Frequency of micturition: Secondary | ICD-10-CM | POA: Diagnosis not present

## 2020-08-26 ENCOUNTER — Ambulatory Visit (INDEPENDENT_AMBULATORY_CARE_PROVIDER_SITE_OTHER): Payer: Medicare Other | Admitting: Family

## 2020-08-26 ENCOUNTER — Encounter: Payer: Self-pay | Admitting: Family

## 2020-08-26 ENCOUNTER — Other Ambulatory Visit: Payer: Self-pay

## 2020-08-26 DIAGNOSIS — Z0000 Encounter for general adult medical examination without abnormal findings: Secondary | ICD-10-CM | POA: Diagnosis not present

## 2020-08-26 NOTE — Patient Instructions (Signed)
[name] , Thank you for taking time to come for your Medicare Wellness Visit. I appreciate your ongoing commitment to your health goals. Please review the following plan we discussed and let me know if I can assist you in the future.   Screening recommendations/referrals: Colonoscopy: N/A  Recommended yearly ophthalmology/optometry visit for glaucoma screening and checkup Recommended yearly dental visit for hygiene and checkup  Vaccinations: Influenza vaccine: Up to date  Pneumococcal vaccine: Up to date  Tdap vaccine : Up to date  Shingles vaccine : Up to date    Advanced directives:Yes   Conditions/risks identified: Advance Age male > 61 yrs,male gender,Hypertension,dyslipidemia,hx of smoking   Next appointment:1 year   Preventive Care 62 Years and Older, Male Preventive care refers to lifestyle choices and visits with your health care provider that can promote health and wellness. What does preventive care include?  A yearly physical exam. This is also called an annual well check.  Dental exams once or twice a year.  Routine eye exams. Ask your health care provider how often you should have your eyes checked.  Personal lifestyle choices, including:  Daily care of your teeth and gums.  Regular physical activity.  Eating a healthy diet.  Avoiding tobacco and drug use.  Limiting alcohol use.  Practicing safe sex.  Taking low doses of aspirin every day.  Taking vitamin and mineral supplements as recommended by your health care provider. What happens during an annual well check? The services and screenings done by your health care provider during your annual well check will depend on your age, overall health, lifestyle risk factors, and family history of disease. Counseling  Your health care provider may ask you questions about your:  Alcohol use.  Tobacco use.  Drug use.  Emotional well-being.  Home and relationship well-being.  Sexual  activity.  Eating habits.  History of falls.  Memory and ability to understand (cognition).  Work and work Statistician. Screening  You may have the following tests or measurements:  Height, weight, and BMI.  Blood pressure.  Lipid and cholesterol levels. These may be checked every 5 years, or more frequently if you are over 72 years old.  Skin check.  Lung cancer screening. You may have this screening every year starting at age 79 if you have a 30-pack-year history of smoking and currently smoke or have quit within the past 15 years.  Fecal occult blood test (FOBT) of the stool. You may have this test every year starting at age 52.  Flexible sigmoidoscopy or colonoscopy. You may have a sigmoidoscopy every 5 years or a colonoscopy every 10 years starting at age 34.  Prostate cancer screening. Recommendations will vary depending on your family history and other risks.  Hepatitis C blood test.  Hepatitis B blood test.  Sexually transmitted disease (STD) testing.  Diabetes screening. This is done by checking your blood sugar (glucose) after you have not eaten for a while (fasting). You may have this done every 1-3 years.  Abdominal aortic aneurysm (AAA) screening. You may need this if you are a current or former smoker.  Osteoporosis. You may be screened starting at age 63 if you are at high risk. Talk with your health care provider about your test results, treatment options, and if necessary, the need for more tests. Vaccines  Your health care provider may recommend certain vaccines, such as:  Influenza vaccine. This is recommended every year.  Tetanus, diphtheria, and acellular pertussis (Tdap, Td) vaccine. You may need  a Td booster every 10 years.  Zoster vaccine. You may need this after age 43.  Pneumococcal 13-valent conjugate (PCV13) vaccine. One dose is recommended after age 17.  Pneumococcal polysaccharide (PPSV23) vaccine. One dose is recommended after age  65. Talk to your health care provider about which screenings and vaccines you need and how often you need them. This information is not intended to replace advice given to you by your health care provider. Make sure you discuss any questions you have with your health care provider. Document Released: 11/04/2015 Document Revised: 06/27/2016 Document Reviewed: 08/09/2015 Elsevier Interactive Patient Education  2017 [place] Prevention in the Home Falls can cause injuries. They can happen to people of all ages. There are many things you can do to make your home safe and to help prevent falls. What can I do on the outside of my home?  Regularly fix the edges of walkways and driveways and fix any cracks.  Remove anything that might make you trip as you walk through a door, such as a raised step or threshold.  Trim any bushes or trees on the path to your home.  Use bright outdoor lighting.  Clear any walking paths of anything that might make someone trip, such as rocks or tools.  Regularly check to see if handrails are loose or broken. Make sure that both sides of any steps have handrails.  Any raised decks and porches should have guardrails on the edges.  Have any leaves, snow, or ice cleared regularly.  Use sand or salt on walking paths during winter.  Clean up any spills in your garage right away. This includes oil or grease spills. What can I do in the bathroom?  Use night lights.  Install grab bars by the toilet and in the tub and shower. Do not use towel bars as grab bars.  Use non-skid mats or decals in the tub or shower.  If you need to sit down in the shower, use a plastic, non-slip stool.  Keep the floor dry. Clean up any water that spills on the floor as soon as it happens.  Remove soap buildup in the tub or shower regularly.  Attach bath mats securely with double-sided non-slip rug tape.  Do not have throw rugs and other things on the floor that can make  you trip. What can I do in the bedroom?  Use night lights.  Make sure that you have a light by your bed that is easy to reach.  Do not use any sheets or blankets that are too big for your bed. They should not hang down onto the floor.  Have a firm chair that has side arms. You can use this for support while you get dressed.  Do not have throw rugs and other things on the floor that can make you trip. What can I do in the kitchen?  Clean up any spills right away.  Avoid walking on wet floors.  Keep items that you use a lot in easy-to-reach places.  If you need to reach something above you, use a strong step stool that has a grab bar.  Keep electrical cords out of the way.  Do not use floor polish or wax that makes floors slippery. If you must use wax, use non-skid floor wax.  Do not have throw rugs and other things on the floor that can make you trip. What can I do with my stairs?  Do not leave any items on the  stairs.  Make sure that there are handrails on both sides of the stairs and use them. Fix handrails that are broken or loose. Make sure that handrails are as long as the stairways.  Check any carpeting to make sure that it is firmly attached to the stairs. Fix any carpet that is loose or worn.  Avoid having throw rugs at the top or bottom of the stairs. If you do have throw rugs, attach them to the floor with carpet tape.  Make sure that you have a light switch at the top of the stairs and the bottom of the stairs. If you do not have them, ask someone to add them for you. What else can I do to help prevent falls?  Wear shoes that:  Do not have high heels.  Have rubber bottoms.  Are comfortable and fit you well.  Are closed at the toe. Do not wear sandals.  If you use a stepladder:  Make sure that it is fully opened. Do not climb a closed stepladder.  Make sure that both sides of the stepladder are locked into place.  Ask someone to hold it for you, if  possible.  Clearly mark and make sure that you can see:  Any grab bars or handrails.  First and last steps.  Where the edge of each step is.  Use tools that help you move around (mobility aids) if they are needed. These include:  Canes.  Walkers.  Scooters.  Crutches.  Turn on the lights when you go into a dark area. Replace any light bulbs as soon as they burn out.  Set up your furniture so you have a clear path. Avoid moving your furniture around.  If any of your floors are uneven, fix them.  If there are any pets around you, be aware of where they are.  Review your medicines with your doctor. Some medicines can make you feel dizzy. This can increase your chance of falling. Ask your doctor what other things that you can do to help prevent falls. This information is not intended to replace advice given to you by your health care provider. Make sure you discuss any questions you have with your health care provider. Document Released: 08/04/2009 Document Revised: 03/15/2016 Document Reviewed: 11/12/2014 Elsevier Interactive Patient Education  2017 [employer].

## 2020-08-26 NOTE — Progress Notes (Signed)
    This service is provided via telemedicine  No vital signs collected/recorded due to the encounter was a telemedicine visit.   Location of patient (ex: home, work): Home.   Patient consents to a telephone visit: Yes.  Location of the provider (ex: office, home):  [facility].  Name of any referring provider: Reed, Tiffany L, DO   Names of all persons participating in the telemedicine service and their role in the encounter: Patient, Isahi Godwin, RMA, Ngetich, Dinah, NP.    Time spent on call: 8 minutes spent on the phone with Medical Assistant.   

## 2020-08-26 NOTE — Progress Notes (Signed)
Subjective:   Samuel [name] is a 86 y.o. male who presents for Medicare Annual/Subsequent preventive examination.  Review of Systems     Cardiac Risk Factors include: advanced age (>37men, >43 women);hypertension;male gender;dyslipidemia;smoking/ tobacco exposure     Objective:    Today's Vitals   08/26/20 1503  PainSc: 0-No pain   There is no height or weight on file to calculate BMI.  Advanced Directives 08/26/2020 08/10/2020 04/27/2020 12/23/2019 02/18/2019 08/13/2018 02/18/2018  Does Patient Have a Medical Advance Directive? Yes Yes Yes Yes Yes Yes Yes  Type of Paramedic of [place];Living will Living will;Healthcare Power of Attorney Living will [place];Living will [place];Living will [place];Living will [place];Living will  Does patient want to make changes to medical advance directive? No - Patient declined - No - Patient declined - No - Patient declined No - Patient declined No - Patient declined  Copy of [place] in Chart? Yes - validated most recent copy scanned in chart (See row information) Yes - validated most recent copy scanned in chart (See row information) - - Yes - validated most recent copy scanned in chart (See row information) Yes Yes    Current Medications (verified) Outpatient Encounter Medications as of 08/26/2020  Medication Sig  . alfuzosin (UROXATRAL) 10 MG 24 hr tablet Take 10 mg by mouth daily with breakfast. to reduce bladder spasm  . aspirin 81 MG tablet Take 81 mg by mouth every other day. for anticoagulation  . Dupilumab 300 MG/2ML SOPN Inject into the skin. One Injection every 2 weeks.  Marland Kitchen HYDROcodone-acetaminophen (NORCO) 5-325 MG tablet Take 1 tablet by mouth every 4 (four) hours as needed.  Marland Kitchen ibuprofen (ADVIL) 200 MG tablet Take 400 mg by mouth every 6 (six) hours as needed for mild pain or moderate pain.  Marland Kitchen losartan (COZAAR)  25 MG tablet Take 1 tablet (25 mg total) by mouth daily.  . metoprolol succinate (TOPROL-XL) 25 MG 24 hr tablet Take 12.5 mg by mouth daily.  . nabumetone (RELAFEN) 750 MG tablet TAKE 1 TABLET TWICE A DAY  . ondansetron (ZOFRAN) 4 MG tablet Take 1 tablet (4 mg total) by mouth every 8 (eight) hours as needed for nausea or vomiting.  . simvastatin (ZOCOR) 40 MG tablet Take 20 mg by mouth daily.  Marland Kitchen TIROSINT 50 MCG CAPS TAKE 1 CAPSULE DAILY BEFORE BREAKFAST  . VESICARE 5 MG tablet Take 5 mg by mouth daily.   . [DISCONTINUED] simvastatin (ZOCOR) 40 MG tablet Take 1 tablet (40 mg total) by mouth daily at 6 PM. (Patient taking differently: Take 20 mg by mouth daily at 6 PM. )   No facility-administered encounter medications on file as of 08/26/2020.    Allergies (verified) Patient has no known allergies.   History: Past Medical History:  Diagnosis Date  . Aortoiliac occlusive disease ([place])   . Carotid artery disease ([place])   . Complication of anesthesia 1955   sodium pentathol ?, N/V  . Contracture of palmar fascia 06/21/2010  . Cortical senile cataract 06/27/2010  . Dysrhythmia    PAF  . Enthesopathy of hip region 11/27/2009  . Essential hypertension, benign 06/21/2010  . GERD (gastroesophageal reflux disease)    occ  . Head injury with loss of consciousness ([place])    short peiod  . Heart murmur   . Hyperlipidemia LDL goal <100 06/21/2010  . Hypertrophy of prostate without urinary obstruction and other lower  urinary tract symptoms (LUTS) 06/21/2010  . Hypothyroidism 08/27/2011  . Malignant neoplasm of prostate ([place]) 06/21/2010  . Mitral regurgitation 10/07/2015   Severe MR noted on ECHO with partially flail post leaflet Oct 03 2015   . Nocturia 2011  . Osteoarthrosis, unspecified whether generalized or localized, unspecified site 06/21/2010  . Other and unspecified hyperlipidemia 06/21/2010  . Pain in joint, lower leg 02/25/2012  . Palpitations 08/14/2010  . Patent foramen ovale 01/04/2016    Closed at the time of mitral valve repair   . PONV (postoperative nausea and vomiting)   . S/P minimally invasive mitral valve repair 01/04/2016   Complex valvuloplasty including triangular resection of flail segment of posterior leaflet, artificial Gore-tex neochord placement x6 and 47mm Sorin Memo 3D ring annuloplasty via right mini thoracotomy approach with closure of PFO and clipping of LA appendage   . Unspecified constipation 06/21/2010  . Unspecified essential hypertension 06/21/2010  . Unspecified hypothyroidism 08/27/2011  . Urinary frequency 08/14/2008   Past Surgical History:  Procedure Laterality Date  . CARDIAC CATHETERIZATION N/A 11/10/2015   Procedure: Right/Left Heart Cath and Coronary Angiography;  Surgeon: Peter M Martinique, MD;  Location: [facility];  Service: Cardiovascular;  Laterality: N/A;  . CATARACT EXTRACTION W/ INTRAOCULAR LENS  IMPLANT, BILATERAL  2006  . CLIPPING OF ATRIAL APPENDAGE N/A 01/04/2016   Procedure: CLIPPING OF ATRIAL APPENDAGE;  Surgeon: Rexene Alberts, MD;  Location: [facility];  Service: Open Heart Surgery;  Laterality: N/A;  . COLONOSCOPY    . KNEE ARTHROSCOPY WITH MENISCAL REPAIR Left 1968  . MITRAL VALVE REPAIR Right 01/04/2016   Procedure: MINIMALLY INVASIVE MITRAL VALVE REPAIR (MVR);  Surgeon: Rexene Alberts, MD;  Location: [facility];  Service: Open Heart Surgery;  Laterality: Right;  . Open knee- ligament repair Right 1955   Dr. Ethel Rana  . PROSTATE SURGERY  2000   seed implants  . REVERSE SHOULDER ARTHROPLASTY Right 08/12/2020   Procedure: REVERSE SHOULDER ARTHROPLASTY;  Surgeon: Justice Britain, MD;  Location: [facility];  Service: Orthopedics;  Laterality: Right;  189min  . TEE WITHOUT CARDIOVERSION N/A 11/10/2015   Procedure: TRANSESOPHAGEAL ECHOCARDIOGRAM (TEE);  Surgeon: Skeet Latch, MD;  Location: [facility];  Service: Cardiovascular;  Laterality: N/A;  . TEE WITHOUT CARDIOVERSION N/A 01/04/2016   Procedure: TRANSESOPHAGEAL ECHOCARDIOGRAM (TEE);   Surgeon: Rexene Alberts, MD;  Location: [facility];  Service: Open Heart Surgery;  Laterality: N/A;   Family History  Problem Relation Age of Onset  . Heart disease Mother        myocardial infarction  . Heart disease Father        myocardial infarction   Social History   Socioeconomic History  . Marital status: Married    Spouse name: Not on file  . Number of children: Not on file  . Years of education: Not on file  . Highest education level: Not on file  Occupational History  . Occupation: retired Engineer, materials at [employer]  . Smoking status: Former Smoker    Packs/day: 0.50    Years: 5.00    Pack years: 2.50    Types: Cigarettes, Pipe    Quit date: 09/04/1969    Years since quitting: 51.0  . Smokeless tobacco: Never Used  Vaping Use  . Vaping Use: Never used  Substance and Sexual Activity  . Alcohol use: Yes    Alcohol/week: 14.0 standard drinks    Types: 14 Shots of liquor per week  . Drug  use: No  . Sexual activity: Not on file  Other Topics Concern  . Not on file  Social History Narrative   Lives at [facility] since 05/2010   Married [name]    Has living will, POA   Retired from [employer] after 29 years   Stopped smoking 1970   Exercise run 1 1/2 mile every other day, swim 15 minutes every other day, use machines in gym   Alcohol 2 drinks daily   Social Determinants of Health   Financial Resource Strain:   . Difficulty of Paying Living Expenses: Not on file  Food Insecurity:   . Worried About Charity fundraiser in the Last Year: Not on file  . Ran Out of Food in the Last Year: Not on file  Transportation Needs:   . Lack of Transportation (Medical): Not on file  . Lack of Transportation (Non-Medical): Not on file  Physical Activity:   . Days of Exercise per Week: Not on file  . Minutes of Exercise per Session: Not on file  Stress:   . Feeling of Stress : Not on file  Social Connections:   . Frequency of Communication with Friends and  Family: Not on file  . Frequency of Social Gatherings with Friends and Family: Not on file  . Attends Religious Services: Not on file  . Active Member of Clubs or Organizations: Not on file  . Attends Archivist Meetings: Not on file  . Marital Status: Not on file    Tobacco Counseling Counseling given: Not Answered   Clinical Intake:  Pre-visit preparation completed: Yes  Pain : No/denies pain Pain Score: 0-No pain     BMI - recorded: 25.08 Nutritional Status: BMI 25 -29 Overweight Nutritional Risks: None Diabetes: No  How often do you need to have someone help you when you read instructions, pamphlets, or other written materials from your doctor or pharmacy?: 1 - Never What is the last grade level you completed in school?: PHd  Diabetic?No   Interpreter Needed?: No  Information entered by :: Earleen Aoun FNP-C   Activities of Daily Living In your present state of health, do you have any difficulty performing the following activities: 08/26/2020 08/12/2020  Hearing? N -  Vision? N -  Difficulty concentrating or making decisions? Y -  Comment memory -  Walking or climbing stairs? N -  Dressing or bathing? Y -  Comment right shoulder fracture has a sling -  Doing errands, shopping? N N  Preparing Food and eating ? N -  Comment eats meal at facility -  Using the Toilet? N -  In the past six months, have you accidently leaked urine? N -  Do you have problems with loss of bowel control? N -  Managing your Medications? N -  Managing your Finances? N -  Housekeeping or managing your Housekeeping? Y -  Comment Has Chartered certified accountant -  Some recent data might be hidden    Patient Care Team: Gayland Curry, DO as PCP - General (Geriatric Medicine) Jerline Pain, MD as PCP - Cardiology (Cardiology) [facility], [place] Donovan, NP as Nurse Practitioner (Geriatric Medicine) [name], MD as Consulting Physician  (Cardiology) Raynelle Bring, MD as Consulting Physician (Urology)  Indicate any recent Medical Services you may have received from other than [facility] providers in the past year (date may be approximate).     Assessment:   This is a routine wellness examination for Samuel [name].  Hearing/Vision screen  Hearing Screening   125Hz  250Hz  500Hz  1000Hz  2000Hz  3000Hz  4000Hz  6000Hz  8000Hz   Right ear:           Left ear:           Comments: No Hearing Concerns.   Vision Screening Comments: No Vision Concerns. Patient last eye exam was July 2021.  Dietary issues and exercise activities discussed: Current Exercise Habits: Home exercise routine, Type of exercise: walking, Time (Minutes): 30, Frequency (Times/Week): 3, Weekly Exercise (Minutes/Week): 90, Intensity: Moderate, Exercise limited by: Other - see comments (right shoulder fracture)  Goals    . Exercise 3x per week (30 min per time)     Exercise every other day of the week       Depression Screen PHQ 2/9 Scores 08/26/2020 04/27/2020 06/17/2019 08/13/2018 05/07/2018 02/18/2018 12/25/2017  PHQ - 2 Score 0 0 0 0 0 0 0    Fall Risk Fall Risk  08/26/2020 08/10/2020 04/27/2020 12/23/2019 06/17/2019  Falls in the past year? 1 1 0 0 0  Comment Patient fell 2 weeks ago and broke right shoulder. - - - -  Number falls in past yr: 0 0 0 0 0  Injury with Fall? 1 1 0 0 0  Comment - Dislocated shoulder and bruise on his knees - - -    Any stairs in or around the home? No  If so, are there any without handrails? No  Home free of loose throw rugs in walkways, pet beds, electrical cords, etc? No  Adequate lighting in your home to reduce risk of falls? Yes   ASSISTIVE DEVICES UTILIZED TO PREVENT FALLS:  Life alert? No  Use of a cane, walker or w/c? No  Grab bars in the bathroom? No  Shower chair or bench in shower? No  Elevated toilet seat or a handicapped toilet? No   TIMED UP AND GO:  Was the test performed? No .  Length of time to ambulate 10 feet: N/A   sec.   Gait steady and fast without use of assistive device  Cognitive Function: MMSE - Mini Mental State Exam 02/18/2018 12/12/2016 11/02/2015  Orientation to time 5 5 5   Orientation to Place 5 5 5   Registration 3 3 3   Attention/ Calculation 4 5 5   Recall 2 1 1   Language- name 2 objects 2 2 2   Language- repeat 1 1 1   Language- follow 3 step command 3 3 3   Language- read & follow direction 1 1 1   Write a sentence 1 1 1   Copy design 1 1 1   Total score 28 28 28      6CIT Screen 08/26/2020 08/20/2019  What Year? 0 points 0 points  What month? 0 points 0 points  What time? 0 points 0 points  Count back from 20 0 points 0 points  Months in reverse 0 points 0 points  Repeat phrase 0 points 4 points  Total Score 0 4    Immunizations Immunization History  Administered Date(s) Administered  . Influenza, High Dose Seasonal PF 08/16/2017  . Influenza,inj,Quad PF,6+ Mos 07/20/2016, 08/15/2018, 07/31/2019  . Influenza-Unspecified 07/27/2013, 07/07/2014, 08/11/2015  . Moderna SARS-COVID-2 Vaccination 11/30/2018, 11/03/2019  . Pneumococcal Conjugate-13 10/26/2014  . Pneumococcal Polysaccharide-23 11/23/2015  . Td 10/23/2007  . Tdap 02/28/2015  . Zoster 10/22/2008  . Zoster Recombinat (Shingrix) 12/03/2016, 03/06/2017    TDAP status: Up to date Flu Vaccine status: Up to date Pneumococcal vaccine status: Up to date Covid-19 vaccine status: Completed vaccines  Qualifies  for Shingles Vaccine? Yes   Zostavax completed Yes   Shingrix Completed?: Yes  Screening Tests Health Maintenance  Topic Date Due  . INFLUENZA VACCINE  05/22/2020  . TETANUS/TDAP  02/27/2025  . COVID-19 Vaccine  Completed  . PNA vac Low Risk Adult  Completed    Health Maintenance  Health Maintenance Due  Topic Date Due  . INFLUENZA VACCINE  05/22/2020    Colorectal cancer screening: No longer required.   Lung Cancer Screening: (Low Dose CT Chest recommended if Age 61-80 years, 30 pack-year currently  smoking OR have quit w/in 15years.) does not qualify.   Lung Cancer Screening Referral: No   Additional Screening:  Hepatitis C Screening: does not qualify; Completed yes   Vision Screening: Recommended annual ophthalmology exams for early detection of glaucoma and other disorders of the eye. Is the patient up to date with their annual eye exam?  Yes  Who is the provider or what is the name of the office in which the patient attends annual eye exams? Dr.McCuen  If pt is not established with a provider, would they like to be referred to a provider to establish care? No .   Dental Screening: Recommended annual dental exams for proper oral hygiene  Community Resource Referral / Chronic Care Management: CRR required this visit?  No   CCM required this visit?  No      Plan:     I have personally reviewed and noted the following in the patient's chart:   . Medical and social history . Use of alcohol, tobacco or illicit drugs  . Current medications and supplements . Functional ability and status . Nutritional status . Physical activity . Advanced directives . List of other physicians . Hospitalizations, surgeries, and ER visits in previous 12 months . Vitals . Screenings to include cognitive, depression, and falls . Referrals and appointments  In addition, I have reviewed and discussed with patient certain preventive protocols, quality metrics, and best practice recommendations. A written personalized care plan for preventive services as well as general preventive health recommendations were provided to patient.    Sandrea Hughs, NP   08/26/2020   Nurse Notes: None

## 2020-08-29 ENCOUNTER — Telehealth: Payer: Self-pay

## 2020-08-29 NOTE — Telephone Encounter (Signed)
Mr. Berdie Ogren are scheduled for a virtual visit with your provider today.    Just as we do with appointments in the office, we must obtain your consent to participate.  Your consent will be active for this visit and any virtual visit you may have with one of our providers in the next 365 days.    If you have a MyChart account, I can also send a copy of this consent to you electronically.  All virtual visits are billed to your insurance company just like a traditional visit in the office.  As this is a virtual visit, video technology does not allow for your provider to perform a traditional examination.  This may limit your provider's ability to fully assess your condition.  If your provider identifies any concerns that need to be evaluated in person or the need to arrange testing such as labs, EKG, etc, we will make arrangements to do so.    Although advances in technology are sophisticated, we cannot ensure that it will always work on either your end or our end.  If the connection with a video visit is poor, we may have to switch to a telephone visit.  With either a video or telephone visit, we are not always able to ensure that we have a secure connection.   I need to obtain your verbal consent now.   Are you willing to proceed with your visit today?   Breckyn Ticas has provided verbal consent on 08/26/2020 for a virtual visit (video or telephone).   [place], [place] 08/26/2020  2:45 PM

## 2020-09-06 DIAGNOSIS — Z23 Encounter for immunization: Secondary | ICD-10-CM | POA: Diagnosis not present

## 2020-09-08 DIAGNOSIS — M25611 Stiffness of right shoulder, not elsewhere classified: Secondary | ICD-10-CM | POA: Diagnosis not present

## 2020-09-13 DIAGNOSIS — M25611 Stiffness of right shoulder, not elsewhere classified: Secondary | ICD-10-CM | POA: Diagnosis not present

## 2020-09-14 DIAGNOSIS — Z9889 Other specified postprocedural states: Secondary | ICD-10-CM | POA: Insufficient documentation

## 2020-09-19 DIAGNOSIS — Z96611 Presence of right artificial shoulder joint: Secondary | ICD-10-CM | POA: Diagnosis not present

## 2020-09-19 DIAGNOSIS — M25611 Stiffness of right shoulder, not elsewhere classified: Secondary | ICD-10-CM | POA: Diagnosis not present

## 2020-09-19 DIAGNOSIS — Z471 Aftercare following joint replacement surgery: Secondary | ICD-10-CM | POA: Diagnosis not present

## 2020-10-04 DIAGNOSIS — M25611 Stiffness of right shoulder, not elsewhere classified: Secondary | ICD-10-CM | POA: Diagnosis not present

## 2020-10-05 ENCOUNTER — Other Ambulatory Visit: Payer: Self-pay

## 2020-10-05 ENCOUNTER — Encounter: Payer: Self-pay | Admitting: Internal Medicine

## 2020-10-05 ENCOUNTER — Ambulatory Visit: Payer: Medicare Other | Admitting: Internal Medicine

## 2020-10-05 VITALS — BP 118/72 | HR 66 | Temp 97.9°F | Ht 67.0 in | Wt 154.0 lb

## 2020-10-05 DIAGNOSIS — I7409 Other arterial embolism and thrombosis of abdominal aorta: Secondary | ICD-10-CM | POA: Diagnosis not present

## 2020-10-05 DIAGNOSIS — K4190 Unilateral femoral hernia, without obstruction or gangrene, not specified as recurrent: Secondary | ICD-10-CM | POA: Diagnosis not present

## 2020-10-05 DIAGNOSIS — R609 Edema, unspecified: Secondary | ICD-10-CM

## 2020-10-05 MED ORDER — FUROSEMIDE 40 MG PO TABS: 40.0000 mg | ORAL_TABLET | Freq: Every day | ORAL | 1 refills | Status: DC

## 2020-10-05 NOTE — Progress Notes (Signed)
Location:  [facility]   Place of Service:   clinic  Provider: Rodd Heft L. Mariea Clonts, D.O., C.M.D.  Code Status: full code Goals of Care:  Advanced Directives 10/05/2020  Does Patient Have a Medical Advance Directive? Yes  Type of Advance Directive Living will  Does patient want to make changes to medical advance directive? No - Patient declined  Copy of [place] in Chart? -     Chief Complaint  Patient presents with  . Acute Visit    Raised area in groin     HPI: Patient is a 86 y.o. [name] seen today for an acute visit for raised area in his groin.  He may have had it since October.  He's not sure if it was before or after his fall 10/22.  No pain.  Has a rash over it also, but no itching.    He is pleased his wife is doing better today in rehab after her fall when they were in [place].    We talked about his wife's code status which he's changed back to full code.  Discussed her qol if she has to have CPR and actually survives this in her current state and the pain she'd have from it.  He wants to take that risk.   Past Medical History:  Diagnosis Date  . Aortoiliac occlusive disease ([place])   . Carotid artery disease ([place])   . Complication of anesthesia 1955   sodium pentathol ?, N/V  . Contracture of palmar fascia 06/21/2010  . Cortical senile cataract 06/27/2010  . Dysrhythmia    PAF  . Enthesopathy of hip region 11/27/2009  . Essential hypertension, benign 06/21/2010  . GERD (gastroesophageal reflux disease)    occ  . Head injury with loss of consciousness ([place])    short peiod  . Heart murmur   . Hyperlipidemia LDL goal <100 06/21/2010  . Hypertrophy of prostate without urinary obstruction and other lower urinary tract symptoms (LUTS) 06/21/2010  . Hypothyroidism 08/27/2011  . Malignant neoplasm of prostate ([place]) 06/21/2010  . Mitral regurgitation 10/07/2015   Severe MR noted on ECHO with partially flail post leaflet Oct 03 2015   . Nocturia 2011  .  Osteoarthrosis, unspecified whether generalized or localized, unspecified site 06/21/2010  . Other and unspecified hyperlipidemia 06/21/2010  . Pain in joint, lower leg 02/25/2012  . Palpitations 08/14/2010  . Patent foramen ovale 01/04/2016   Closed at the time of mitral valve repair   . PONV (postoperative nausea and vomiting)   . S/P minimally invasive mitral valve repair 01/04/2016   Complex valvuloplasty including triangular resection of flail segment of posterior leaflet, artificial Gore-tex neochord placement x6 and 78mm Sorin Memo 3D ring annuloplasty via right mini thoracotomy approach with closure of PFO and clipping of LA appendage   . Unspecified constipation 06/21/2010  . Unspecified essential hypertension 06/21/2010  . Unspecified hypothyroidism 08/27/2011  . Urinary frequency 08/14/2008    Past Surgical History:  Procedure Laterality Date  . CARDIAC CATHETERIZATION N/A 11/10/2015   Procedure: Right/Left Heart Cath and Coronary Angiography;  Surgeon: Peter M Martinique, MD;  Location: [facility];  Service: Cardiovascular;  Laterality: N/A;  . CATARACT EXTRACTION W/ INTRAOCULAR LENS  IMPLANT, BILATERAL  2006  . CLIPPING OF ATRIAL APPENDAGE N/A 01/04/2016   Procedure: CLIPPING OF ATRIAL APPENDAGE;  Surgeon: Rexene Alberts, MD;  Location: [facility];  Service: Open Heart Surgery;  Laterality: N/A;  . COLONOSCOPY    . KNEE ARTHROSCOPY  WITH MENISCAL REPAIR Left 1968  . MITRAL VALVE REPAIR Right 01/04/2016   Procedure: MINIMALLY INVASIVE MITRAL VALVE REPAIR (MVR);  Surgeon: Rexene Alberts, MD;  Location: [facility];  Service: Open Heart Surgery;  Laterality: Right;  . Open knee- ligament repair Right 1955   Dr. Ethel Rana  . PROSTATE SURGERY  2000   seed implants  . REVERSE SHOULDER ARTHROPLASTY Right 08/12/2020   Procedure: REVERSE SHOULDER ARTHROPLASTY;  Surgeon: Justice Britain, MD;  Location: [facility];  Service: Orthopedics;  Laterality: Right;  132min  . TEE WITHOUT CARDIOVERSION N/A 11/10/2015    Procedure: TRANSESOPHAGEAL ECHOCARDIOGRAM (TEE);  Surgeon: Skeet Latch, MD;  Location: [facility];  Service: Cardiovascular;  Laterality: N/A;  . TEE WITHOUT CARDIOVERSION N/A 01/04/2016   Procedure: TRANSESOPHAGEAL ECHOCARDIOGRAM (TEE);  Surgeon: Rexene Alberts, MD;  Location: [facility];  Service: Open Heart Surgery;  Laterality: N/A;    No Known Allergies  Outpatient Encounter Medications as of 10/05/2020  Medication Sig  . alfuzosin (UROXATRAL) 10 MG 24 hr tablet Take 10 mg by mouth daily with breakfast. to reduce bladder spasm  . aspirin 81 MG tablet Take 81 mg by mouth every other day. for anticoagulation  . Dupilumab 300 MG/2ML SOPN Inject into the skin. One Injection every 2 weeks.  Marland Kitchen ibuprofen (ADVIL) 200 MG tablet Take 400 mg by mouth every 6 (six) hours as needed for mild pain or moderate pain.  Marland Kitchen losartan (COZAAR) 25 MG tablet Take 1 tablet (25 mg total) by mouth daily.  . metoprolol succinate (TOPROL-XL) 25 MG 24 hr tablet Take 12.5 mg by mouth daily.  . nabumetone (RELAFEN) 750 MG tablet TAKE 1 TABLET TWICE A DAY  . simvastatin (ZOCOR) 40 MG tablet Take 20 mg by mouth daily.  Marland Kitchen TIROSINT 50 MCG CAPS TAKE 1 CAPSULE DAILY BEFORE BREAKFAST  . VESICARE 5 MG tablet Take 5 mg by mouth daily.   . [DISCONTINUED] HYDROcodone-acetaminophen (NORCO) 5-325 MG tablet Take 1 tablet by mouth every 4 (four) hours as needed.  . [DISCONTINUED] ondansetron (ZOFRAN) 4 MG tablet Take 1 tablet (4 mg total) by mouth every 8 (eight) hours as needed for nausea or vomiting.   No facility-administered encounter medications on file as of 10/05/2020.    Review of Systems:  Review of Systems  Constitutional: Negative for chills and fever.  HENT: Negative for congestion.   Respiratory: Negative for shortness of breath.   Cardiovascular: Positive for leg swelling. Negative for chest pain.  Gastrointestinal: Negative for abdominal pain, constipation and diarrhea.       Left groin swelling   Genitourinary: Negative for dysuria.  Musculoskeletal: Positive for falls.       Right shoulder recovering after fall and dislocation  Neurological: Negative for dizziness and loss of consciousness.  Endo/Heme/Allergies: Bruises/bleeds easily.  Psychiatric/Behavioral: Negative for depression and memory loss. The patient is not nervous/anxious.     Health Maintenance  Topic Date Due  . COVID-19 Vaccine (4 - Booster for Moderna series) 03/06/2021  . TETANUS/TDAP  02/27/2025  . INFLUENZA VACCINE  Completed  . PNA vac Low Risk Adult  Completed    Physical Exam: Vitals:   10/05/20 1432  BP: 118/72  Pulse: 66  Temp: 97.9 F (36.6 C)  TempSrc: Temporal  SpO2: 97%  Weight: 154 lb (69.9 kg)  Height: 5\' 7"  (1.702 m)   Body mass index is 24.12 kg/m. Physical Exam Vitals reviewed.  Constitutional:      Appearance: Normal appearance.  Cardiovascular:  Rate and Rhythm: Normal rate and regular rhythm.  Pulmonary:     Effort: Pulmonary effort is normal.     Breath sounds: Normal breath sounds. No rales.  Abdominal:     General: Bowel sounds are normal. There is no distension.     Palpations: Abdomen is soft.     Tenderness: There is no abdominal tenderness. There is no right CVA tenderness or left CVA tenderness.     Hernia: A hernia is present.     Comments: Left groin reducible hernia, normal skin color, no tenderness, does have rash over it that is pink and raised appearing fungal  Neurological:     Mental Status: He is alert.     Labs reviewed: Basic Metabolic Panel: Recent Labs    12/08/19 0500 03/17/20 0000 03/17/20 0800 08/12/20 0919  NA 141 139  --  138  K 4.4 4.5  --  3.7  CL 105 102  --  106  CO2 25* 24*  --  24  GLUCOSE  --   --   --  131*  BUN 22* 28*  --  18  CREATININE 1.3 1.4*  --  1.30*  CALCIUM 9.2  --  9.0 8.6*  TSH 4.57  --   --   --    Liver Function Tests: Recent Labs    12/08/19 0500  ALBUMIN 4.0   No results for input(s): LIPASE,  AMYLASE in the last 8760 hours. No results for input(s): AMMONIA in the last 8760 hours. CBC: Recent Labs    12/08/19 0500 08/12/20 0919  WBC 3.4 5.9  HGB 13.1* 10.8*  HCT 38* 33.3*  MCV  --  94.6  PLT 219 189   Lipid Panel: Recent Labs    12/08/19 0500  CHOL 167  HDL 65  LDLCALC 88  TRIG 72   Lab Results  Component Value Date   HGBA1C 5.5 01/02/2016    Procedures since last visit: No results found.  Assessment/Plan 1. Femoral hernia of left side -advised to observe, avoid heavy lifting, notify me of any increased pain, change in appearance or growth of the area -offered surgical opinion but he'll hold off unless necessary due to changes -apply lotrimin to fungal rash component  2. Chronic aorto-iliac occlusion syndrome (HCC) - furosemide (LASIX) 40 MG tablet; Take 1 tablet (40 mg total) by mouth daily.  Dispense: 90 tablet; Refill: 1  3. Peripheral edema -previously recommended he take his lasix daily now b/c edema in feet had become more chronic since he's been unable to swim with his right shoulder dislocation and surgery--he has not done this--[facility] - furosemide (LASIX) 40 MG tablet; Take 1 tablet (40 mg total) by mouth daily.  Dispense: 90 tablet; Refill: 1  Labs/tests ordered:  No new Next appt:  11/30/2020  Shell Blanchette L. Conner Muegge, D.O. [facility] [address], [place] [address] Cell Phone (Mon-Fri 8am-5pm):  [phone] On Call:  [phone] & follow prompts after 5pm & weekends Office Phone:  [phone] Office Fax:  [phone]

## 2020-10-06 DIAGNOSIS — M25611 Stiffness of right shoulder, not elsewhere classified: Secondary | ICD-10-CM | POA: Diagnosis not present

## 2020-10-13 DIAGNOSIS — M25611 Stiffness of right shoulder, not elsewhere classified: Secondary | ICD-10-CM | POA: Diagnosis not present

## 2020-10-18 DIAGNOSIS — M25611 Stiffness of right shoulder, not elsewhere classified: Secondary | ICD-10-CM | POA: Diagnosis not present

## 2020-10-19 ENCOUNTER — Other Ambulatory Visit: Payer: Self-pay | Admitting: Internal Medicine

## 2020-10-19 DIAGNOSIS — K4190 Unilateral femoral hernia, without obstruction or gangrene, not specified as recurrent: Secondary | ICD-10-CM

## 2020-10-19 DIAGNOSIS — Z471 Aftercare following joint replacement surgery: Secondary | ICD-10-CM | POA: Diagnosis not present

## 2020-10-19 DIAGNOSIS — Z96611 Presence of right artificial shoulder joint: Secondary | ICD-10-CM | POA: Diagnosis not present

## 2020-10-20 DIAGNOSIS — M25611 Stiffness of right shoulder, not elsewhere classified: Secondary | ICD-10-CM | POA: Diagnosis not present

## 2020-11-17 ENCOUNTER — Telehealth: Payer: Self-pay | Admitting: *Deleted

## 2020-11-17 ENCOUNTER — Ambulatory Visit: Payer: Self-pay | Admitting: General Surgery

## 2020-11-17 DIAGNOSIS — K4090 Unilateral inguinal hernia, without obstruction or gangrene, not specified as recurrent: Secondary | ICD-10-CM | POA: Diagnosis not present

## 2020-11-17 NOTE — Telephone Encounter (Signed)
   [facility] Pre-operative Risk Assessment    HEARTCARE STAFF: - Please ensure there is not already an duplicate clearance open for this procedure. - Under Visit Info/Reason for Call, type in Other and utilize the format Clearance MM/DD/YY or Clearance TBD. Do not use dashes or single digits. - If request is for dental extraction, please clarify the # of teeth to be extracted.  Request for surgical clearance:  1. What type of surgery is being performed? LEFT OPEN INGUINAL HERNIA WITHOUT MESH   2. When is this surgery scheduled? TBD  3. What type of clearance is required (medical clearance vs. Pharmacy clearance to hold med vs. Both)? MEDICAL  4. Are there any medications that need to be held prior to surgery and how long? ASA    5. Practice name and name of physician performing surgery? [facility]; DR. Gurney Maxin   6. What is the office phone number? [phone]   7.   What is the office fax number? [place]: Malachi Bonds, CMA  8.   Anesthesia type (None, local, MAC, general) ? GENERAL   Julaine Hua 11/17/2020, 3:37 PM  _________________________________________________________________   (provider comments below)

## 2020-11-17 NOTE — Telephone Encounter (Signed)
   Primary Cardiologist: Candee Furbish, MD  Chart reviewed as part of pre-operative protocol coverage. Given past medical history and time since last visit, based on ACC/AHA guidelines, Amariyon Maynes would be at acceptable risk for the planned procedure without further cardiovascular testing.   His aspirin may be held for 5-7 days prior to his surgery.  Please resume as soon as hemostasis is achieved.  Patient was advised that if he develops new symptoms prior to surgery to contact our office to arrange a follow-up appointment.  He verbalized understanding.  I will route this recommendation to the requesting party via Epic fax function and remove from pre-op pool.  Please call with questions.  Jossie Ng. Joss Mcdill NP-C    11/17/2020, 3:57 PM [address] Office [phone] Fax [phone]

## 2020-11-23 ENCOUNTER — Encounter: Payer: Self-pay | Admitting: Internal Medicine

## 2020-11-24 DIAGNOSIS — E039 Hypothyroidism, unspecified: Secondary | ICD-10-CM | POA: Diagnosis not present

## 2020-11-24 DIAGNOSIS — I10 Essential (primary) hypertension: Secondary | ICD-10-CM | POA: Diagnosis not present

## 2020-11-24 DIAGNOSIS — I480 Paroxysmal atrial fibrillation: Secondary | ICD-10-CM | POA: Diagnosis not present

## 2020-11-24 DIAGNOSIS — E785 Hyperlipidemia, unspecified: Secondary | ICD-10-CM | POA: Diagnosis not present

## 2020-11-24 LAB — BASIC METABOLIC PANEL
BUN: 26 — AB (ref 4–21)
CO2: 24 — AB (ref 13–22)
Chloride: 104 (ref 99–108)
Creatinine: 1.4 — AB (ref 0.6–1.3)
Glucose: 82
Potassium: 4.7 (ref 3.4–5.3)
Sodium: 140 (ref 137–147)

## 2020-11-24 LAB — CBC AND DIFFERENTIAL
HCT: 37 — AB (ref 41–53)
Hemoglobin: 12.7 — AB (ref 13.5–17.5)
Platelets: 223 (ref 150–399)
WBC: 3.9

## 2020-11-24 LAB — LIPID PANEL
Cholesterol: 156 (ref 0–200)
HDL: 68 (ref 35–70)
LDL Cholesterol: 73
LDl/HDL Ratio: 2.3
Triglycerides: 77 (ref 40–160)

## 2020-11-24 LAB — CBC: RBC: 4.15 (ref 3.87–5.11)

## 2020-11-24 LAB — COMPREHENSIVE METABOLIC PANEL
Albumin: 4 (ref 3.5–5.0)
Calcium: 8.8 (ref 8.7–10.7)
Globulin: 2.6

## 2020-11-24 LAB — HEPATIC FUNCTION PANEL
ALT: 27 (ref 10–40)
AST: 34 (ref 14–40)
Alkaline Phosphatase: 113 (ref 25–125)
Bilirubin, Total: 0.3

## 2020-11-24 LAB — TSH: TSH: 4.46 (ref 0.41–5.90)

## 2020-11-30 ENCOUNTER — Other Ambulatory Visit: Payer: Self-pay

## 2020-11-30 ENCOUNTER — Encounter: Payer: Self-pay | Admitting: Internal Medicine

## 2020-11-30 ENCOUNTER — Non-Acute Institutional Stay: Payer: Medicare Other | Admitting: Internal Medicine

## 2020-11-30 VITALS — BP 120/68 | HR 86 | Temp 97.7°F | Ht 67.0 in | Wt 152.0 lb

## 2020-11-30 DIAGNOSIS — Z9889 Other specified postprocedural states: Secondary | ICD-10-CM | POA: Diagnosis not present

## 2020-11-30 DIAGNOSIS — K4090 Unilateral inguinal hernia, without obstruction or gangrene, not specified as recurrent: Secondary | ICD-10-CM

## 2020-11-30 DIAGNOSIS — I7409 Other arterial embolism and thrombosis of abdominal aorta: Secondary | ICD-10-CM

## 2020-11-30 DIAGNOSIS — R609 Edema, unspecified: Secondary | ICD-10-CM

## 2020-11-30 DIAGNOSIS — I480 Paroxysmal atrial fibrillation: Secondary | ICD-10-CM | POA: Diagnosis not present

## 2020-11-30 DIAGNOSIS — Z636 Dependent relative needing care at home: Secondary | ICD-10-CM

## 2020-11-30 DIAGNOSIS — L209 Atopic dermatitis, unspecified: Secondary | ICD-10-CM

## 2020-11-30 NOTE — Progress Notes (Signed)
Location:  Occupational psychologist of Service:  Clinic (12)  Provider: Adiba Fargnoli L. Mariea Clonts, D.O., C.M.D.  Code Status: full Goals of Care:  Advanced Directives 11/30/2020  Does Patient Have a Medical Advance Directive? Yes  Type of Advance Directive Living will  Does patient want to make changes to medical advance directive? -  Copy of [place] in Chart? -   Chief Complaint  Patient presents with  . Medical Management of Chronic Issues    6 month follow up     HPI: Patient is a 87 y.o. male seen today for medical management of chronic diseases.    [name] is doing inguinal hernia surgery 2/28 at [facility] surgery center--outpatient.  Their daughter, Samuel [name], will be here with [name] during this time.    He's taking the "dang lasix" and swelling better than last time.  He's gone back to swimming some--can do backstroke and doggy paddling.  Then he goes in hot tub.  Of course, he's not able to do freestyle with his recent shoulder surgery.  Sees Dr. Onnie Graham in April.  Has exercises to do at home.  Doing them religiously.  He doesn't see that much improvement.  Cannot reach much higher which bothers him.    He is tired and admits this.  His wife's dementia has progressed and she's not eating well and sleeping much of the time.    Past Medical History:  Diagnosis Date  . Aortoiliac occlusive disease ([place])   . Carotid artery disease ([place])   . Complication of anesthesia 1955   sodium pentathol ?, N/V  . Contracture of palmar fascia 06/21/2010  . Cortical senile cataract 06/27/2010  . Dysrhythmia    PAF  . Enthesopathy of hip region 11/27/2009  . Essential hypertension, benign 06/21/2010  . GERD (gastroesophageal reflux disease)    occ  . Head injury with loss of consciousness ([name])    short peiod  . Heart murmur   . Hyperlipidemia LDL goal <100 06/21/2010  . Hypertrophy of prostate without urinary obstruction and other lower urinary tract  symptoms (LUTS) 06/21/2010  . Hypothyroidism 08/27/2011  . Malignant neoplasm of prostate ([place]) 06/21/2010  . Mitral regurgitation 10/07/2015   Severe MR noted on ECHO with partially flail post leaflet Oct 03 2015   . Nocturia 2011  . Osteoarthrosis, unspecified whether generalized or localized, unspecified site 06/21/2010  . Other and unspecified hyperlipidemia 06/21/2010  . Pain in joint, lower leg 02/25/2012  . Palpitations 08/14/2010  . Patent foramen ovale 01/04/2016   Closed at the time of mitral valve repair   . PONV (postoperative nausea and vomiting)   . S/P minimally invasive mitral valve repair 01/04/2016   Complex valvuloplasty including triangular resection of flail segment of posterior leaflet, artificial Gore-tex neochord placement x6 and 55mm Sorin Memo 3D ring annuloplasty via right mini thoracotomy approach with closure of PFO and clipping of LA appendage   . Unspecified constipation 06/21/2010  . Unspecified essential hypertension 06/21/2010  . Unspecified hypothyroidism 08/27/2011  . Urinary frequency 08/14/2008    Past Surgical History:  Procedure Laterality Date  . CARDIAC CATHETERIZATION N/A 11/10/2015   Procedure: Right/Left Heart Cath and Coronary Angiography;  Surgeon: Peter M Martinique, MD;  Location: [facility];  Service: Cardiovascular;  Laterality: N/A;  . CATARACT EXTRACTION W/ INTRAOCULAR LENS  IMPLANT, BILATERAL  2006  . CLIPPING OF ATRIAL APPENDAGE N/A 01/04/2016   Procedure: CLIPPING OF ATRIAL APPENDAGE;  Surgeon: Braulio Conte  Keturah Barre, MD;  Location: [facility];  Service: Open Heart Surgery;  Laterality: N/A;  . COLONOSCOPY    . KNEE ARTHROSCOPY WITH MENISCAL REPAIR Left 1968  . MITRAL VALVE REPAIR Right 01/04/2016   Procedure: MINIMALLY INVASIVE MITRAL VALVE REPAIR (MVR);  Surgeon: Rexene Alberts, MD;  Location: [facility];  Service: Open Heart Surgery;  Laterality: Right;  . Open knee- ligament repair Right 1955   Dr. Ethel Rana  . PROSTATE SURGERY  2000   seed implants   . REVERSE SHOULDER ARTHROPLASTY Right 08/12/2020   Procedure: REVERSE SHOULDER ARTHROPLASTY;  Surgeon: Justice Britain, MD;  Location: [facility];  Service: Orthopedics;  Laterality: Right;  168min  . TEE WITHOUT CARDIOVERSION N/A 11/10/2015   Procedure: TRANSESOPHAGEAL ECHOCARDIOGRAM (TEE);  Surgeon: Skeet Latch, MD;  Location: [facility];  Service: Cardiovascular;  Laterality: N/A;  . TEE WITHOUT CARDIOVERSION N/A 01/04/2016   Procedure: TRANSESOPHAGEAL ECHOCARDIOGRAM (TEE);  Surgeon: Rexene Alberts, MD;  Location: [facility];  Service: Open Heart Surgery;  Laterality: N/A;    No Known Allergies  Outpatient Encounter Medications as of 11/30/2020  Medication Sig  . alfuzosin (UROXATRAL) 10 MG 24 hr tablet Take 10 mg by mouth daily with breakfast. to reduce bladder spasm  . aspirin 81 MG tablet Take 81 mg by mouth every other day. for anticoagulation  . Dupilumab 300 MG/2ML SOPN Inject into the skin. One Injection every 2 weeks.  . furosemide (LASIX) 40 MG tablet Take 1 tablet (40 mg total) by mouth daily.  Marland Kitchen ibuprofen (ADVIL) 200 MG tablet Take 400 mg by mouth every 6 (six) hours as needed for mild pain or moderate pain.  Marland Kitchen losartan (COZAAR) 25 MG tablet Take 1 tablet (25 mg total) by mouth daily.  . metoprolol succinate (TOPROL-XL) 25 MG 24 hr tablet Take 12.5 mg by mouth daily.  . nabumetone (RELAFEN) 750 MG tablet TAKE 1 TABLET TWICE A DAY  . simvastatin (ZOCOR) 40 MG tablet Take 20 mg by mouth daily.  Marland Kitchen TIROSINT 50 MCG CAPS TAKE 1 CAPSULE DAILY BEFORE BREAKFAST  . VESICARE 5 MG tablet Take 5 mg by mouth daily.    No facility-administered encounter medications on file as of 11/30/2020.    Review of Systems:  Review of Systems  Constitutional: Negative for chills and fever.  HENT: Negative for congestion, hearing loss and sore throat.   Eyes: Negative for blurred vision.  Respiratory: Negative for cough and shortness of breath.   Cardiovascular: Positive for leg swelling. Negative for  chest pain, palpitations, orthopnea and PND.  Gastrointestinal: Negative for abdominal pain, blood in stool, constipation and melena.  Genitourinary: Negative for dysuria.  Musculoskeletal: Negative for falls and joint pain.  Skin: Positive for itching and rash.       Gets dupixent injections prescribed by derm--he was going to have clinic nurse do them for him  Neurological: Negative for dizziness and loss of consciousness.  Endo/Heme/Allergies: Bruises/bleeds easily.  Psychiatric/Behavioral: Negative for memory loss. The patient does not have insomnia.        Caregiver stress    Health Maintenance  Topic Date Due  . COVID-19 Vaccine (4 - Booster for Moderna series) 03/06/2021  . TETANUS/TDAP  02/27/2025  . INFLUENZA VACCINE  Completed  . PNA vac Low Risk Adult  Completed    Physical Exam: Vitals:   11/30/20 1336  BP: 120/68  Pulse: 86  Temp: 97.7 F (36.5 C)  SpO2: 98%  Weight: 152 lb (68.9 kg)  Height: 5\' 7"  (1.702  m)   Body mass index is 23.81 kg/m. Physical Exam Vitals reviewed.  Constitutional:      General: He is not in acute distress.    Appearance: Normal appearance. He is not toxic-appearing.  HENT:     Head: Normocephalic and atraumatic.  Cardiovascular:     Rate and Rhythm: Rhythm irregular.     Heart sounds: No murmur heard.   Pulmonary:     Effort: Pulmonary effort is normal.     Breath sounds: Normal breath sounds. No rales.  Abdominal:     General: Bowel sounds are normal.     Palpations: Abdomen is soft.  Musculoskeletal:     Cervical back: Neck supple.     Right lower leg: Edema present.     Left lower leg: Edema present.     Comments: decreased ROM right shoulder; 1+ edema of bilateral ankles, on lasix  Lymphadenopathy:     Cervical: No cervical adenopathy.  Neurological:     General: No focal deficit present.     Mental Status: He is alert and oriented to person, place, and time.     Motor: No weakness.     Gait: Gait normal.   Psychiatric:        Mood and Affect: Mood normal.        Behavior: Behavior normal.     Labs reviewed: Basic Metabolic Panel: Recent Labs    12/08/19 0500 03/17/20 0000 03/17/20 0800 08/12/20 0919  NA 141 139  --  138  K 4.4 4.5  --  3.7  CL 105 102  --  106  CO2 25* 24*  --  24  GLUCOSE  --   --   --  131*  BUN 22* 28*  --  18  CREATININE 1.3 1.4*  --  1.30*  CALCIUM 9.2  --  9.0 8.6*  TSH 4.57  --   --   --    Liver Function Tests: Recent Labs    12/08/19 0500  ALBUMIN 4.0   No results for input(s): LIPASE, AMYLASE in the last 8760 hours. No results for input(s): AMMONIA in the last 8760 hours. CBC: Recent Labs    12/08/19 0500 08/12/20 0919  WBC 3.4 5.9  HGB 13.1* 10.8*  HCT 38* 33.3*  MCV  --  94.6  PLT 219 189   Lipid Panel: Recent Labs    12/08/19 0500  CHOL 167  HDL 65  LDLCALC 88  TRIG 72   Lab Results  Component Value Date   HGBA1C 5.5 01/02/2016    Procedures since last visit: No results found.  Assessment/Plan 1. Left inguinal hernia -for surgery upcoming (see hpi)  2. Peripheral edema -ongoing, felt to be due to #4, began after minimally invasive mitral valve repair  3. Caregiver stress -his wife has declined considerably since her fall on their trip to [place] with their daughter -she was in rehab, but did not improve -referral placed to palliative care for her--if she does not improve with antidepressant, she's certainly hospice eligible -recommended caregiver support group for him with [facility]  4. Chronic aorto-iliac occlusion syndrome (HCC) -felt to be cause of his edema per cardiology -cont lasix which he'd been on prn but didn't really use that way so now on daily -potassium wnl  5. Paroxysmal atrial fibrillation (HCC) -on toprol for rate control and only on baby asa--happened postop when had mitral valve repair  6. History of reverse total replacement of right shoulder joint -continues his PT  exercises and is  back to some swimming now, warned about being particularly careful when helping to provide care for his wife who is very weak and requires support physically  7. Atopic dermatitis, unspecified type -getting dupixent injections for this which developed after his shingrix vaccines -sees [facility] derm about it and clinic nurse to admin every 2 wks  Labs/tests ordered:  No new today Next appt:  F/u at home with [place]. Afnan Cadiente, D.O. [facility] [address],  [address] Cell Phone (Mon-Fri 8am-5pm):  [phone] On Call:  [phone] & follow prompts after 5pm & weekends Office Phone:  [phone] Office Fax:  [phone]

## 2020-12-03 ENCOUNTER — Other Ambulatory Visit: Payer: Self-pay | Admitting: Cardiology

## 2020-12-12 ENCOUNTER — Encounter: Payer: Self-pay | Admitting: Internal Medicine

## 2020-12-15 ENCOUNTER — Encounter (HOSPITAL_BASED_OUTPATIENT_CLINIC_OR_DEPARTMENT_OTHER): Payer: Self-pay | Admitting: General Surgery

## 2020-12-15 ENCOUNTER — Other Ambulatory Visit: Payer: Self-pay

## 2020-12-15 NOTE — Progress Notes (Signed)
Spoke w/ via phone for pre-op interview---pt Lab needs dos----  I stat             Lab results------cardiac clearance jesse cleaver np 11-17-2020 epic/chart, ekg 08-03-2020 epic, echo 08-26-2017 epic, cardiac cath 11-10-2015 epic, [facility] dr skains 08-21-2020 epic COVID test ------12-16-2020 1415 Arrive at -------945 am 12-19-2020 NPO after MN NO Solid Food.  Clear liquids from MN until---845 am then npo Medications to take morning of surgery -----tiroseint (levothryoxine), uroxatral, metorpolol succinate, simvastatin, vesicare Diabetic medication -----n/a Patient Special Instructions -----none Pre-Op special Istructions -----none Patient verbalized understanding of instructions that were given at this phone interview. Patient denies shortness of breath, chest pain, fever, cough at this phone interview.

## 2020-12-16 ENCOUNTER — Other Ambulatory Visit (HOSPITAL_COMMUNITY)
Admission: RE | Admit: 2020-12-16 | Discharge: 2020-12-16 | Disposition: A | Discharge status: Home / Self Care | Payer: Medicare Other | Source: Ambulatory Visit | Attending: General Surgery | Admitting: General Surgery

## 2020-12-16 DIAGNOSIS — Z20822 Contact with and (suspected) exposure to covid-19: Secondary | ICD-10-CM | POA: Insufficient documentation

## 2020-12-16 DIAGNOSIS — Z01812 Encounter for preprocedural laboratory examination: Secondary | ICD-10-CM | POA: Diagnosis not present

## 2020-12-16 LAB — SARS CORONAVIRUS 2 (TAT 6-24 HRS): SARS Coronavirus 2: NEGATIVE

## 2020-12-19 ENCOUNTER — Ambulatory Visit (HOSPITAL_BASED_OUTPATIENT_CLINIC_OR_DEPARTMENT_OTHER): Payer: Medicare Other | Admitting: Anesthesiology

## 2020-12-19 ENCOUNTER — Encounter (HOSPITAL_BASED_OUTPATIENT_CLINIC_OR_DEPARTMENT_OTHER): Payer: Self-pay | Admitting: General Surgery

## 2020-12-19 ENCOUNTER — Encounter (HOSPITAL_BASED_OUTPATIENT_CLINIC_OR_DEPARTMENT_OTHER)
Admission: RE | Disposition: A | Discharge status: Home / Self Care | Payer: Self-pay | Source: Home / Self Care | Attending: General Surgery

## 2020-12-19 ENCOUNTER — Ambulatory Visit (HOSPITAL_BASED_OUTPATIENT_CLINIC_OR_DEPARTMENT_OTHER)
Admission: RE | Admit: 2020-12-19 | Discharge: 2020-12-19 | Disposition: A | Discharge status: Home / Self Care | Payer: Medicare Other | Attending: General Surgery | Admitting: General Surgery

## 2020-12-19 ENCOUNTER — Other Ambulatory Visit: Payer: Self-pay

## 2020-12-19 DIAGNOSIS — Z7982 Long term (current) use of aspirin: Secondary | ICD-10-CM | POA: Diagnosis not present

## 2020-12-19 DIAGNOSIS — Z79899 Other long term (current) drug therapy: Secondary | ICD-10-CM | POA: Insufficient documentation

## 2020-12-19 DIAGNOSIS — I998 Other disorder of circulatory system: Secondary | ICD-10-CM | POA: Insufficient documentation

## 2020-12-19 DIAGNOSIS — Z8546 Personal history of malignant neoplasm of prostate: Secondary | ICD-10-CM | POA: Diagnosis not present

## 2020-12-19 DIAGNOSIS — Z87891 Personal history of nicotine dependence: Secondary | ICD-10-CM | POA: Insufficient documentation

## 2020-12-19 DIAGNOSIS — Z8249 Family history of ischemic heart disease and other diseases of the circulatory system: Secondary | ICD-10-CM | POA: Insufficient documentation

## 2020-12-19 DIAGNOSIS — K4090 Unilateral inguinal hernia, without obstruction or gangrene, not specified as recurrent: Secondary | ICD-10-CM | POA: Diagnosis not present

## 2020-12-19 DIAGNOSIS — E039 Hypothyroidism, unspecified: Secondary | ICD-10-CM | POA: Diagnosis not present

## 2020-12-19 DIAGNOSIS — I10 Essential (primary) hypertension: Secondary | ICD-10-CM | POA: Diagnosis not present

## 2020-12-19 DIAGNOSIS — I480 Paroxysmal atrial fibrillation: Secondary | ICD-10-CM | POA: Diagnosis not present

## 2020-12-19 DIAGNOSIS — Z791 Long term (current) use of non-steroidal anti-inflammatories (NSAID): Secondary | ICD-10-CM | POA: Diagnosis not present

## 2020-12-19 DIAGNOSIS — G8918 Other acute postprocedural pain: Secondary | ICD-10-CM | POA: Diagnosis not present

## 2020-12-19 HISTORY — PX: INGUINAL HERNIA REPAIR: SHX194

## 2020-12-19 LAB — POCT I-STAT, CHEM 8
BUN: 26 mg/dL — ABNORMAL HIGH (ref 8–23)
Calcium, Ion: 1.22 mmol/L (ref 1.15–1.40)
Chloride: 103 mmol/L (ref 98–111)
Creatinine, Ser: 1.3 mg/dL — ABNORMAL HIGH (ref 0.61–1.24)
Glucose, Bld: 100 mg/dL — ABNORMAL HIGH (ref 70–99)
HCT: 34 % — ABNORMAL LOW (ref 39.0–52.0)
Hemoglobin: 11.6 g/dL — ABNORMAL LOW (ref 13.0–17.0)
Potassium: 3.8 mmol/L (ref 3.5–5.1)
Sodium: 141 mmol/L (ref 135–145)
TCO2: 26 mmol/L (ref 22–32)

## 2020-12-19 SURGERY — REPAIR, HERNIA, INGUINAL, ADULT
Anesthesia: General | Laterality: Left

## 2020-12-19 MED ORDER — LIDOCAINE 2% (20 MG/ML) 5 ML SYRINGE
INTRAMUSCULAR | Status: DC | PRN
  Administered 2020-12-19: 100 mg via INTRAVENOUS

## 2020-12-19 MED ORDER — CELECOXIB 200 MG PO CAPS
400.0000 mg | ORAL_CAPSULE | ORAL | Status: AC
  Administered 2020-12-19: 400 mg via ORAL

## 2020-12-19 MED ORDER — ONDANSETRON HCL 4 MG/2ML IJ SOLN
INTRAMUSCULAR | Status: AC
  Filled 2020-12-19: qty 2

## 2020-12-19 MED ORDER — OXYCODONE HCL 5 MG PO TABS: 5.0000 mg | ORAL_TABLET | Freq: Once | ORAL | Status: DC | PRN

## 2020-12-19 MED ORDER — ONDANSETRON HCL 4 MG/2ML IJ SOLN: 4.0000 mg | Freq: Once | INTRAMUSCULAR | Status: DC | PRN

## 2020-12-19 MED ORDER — CELECOXIB 200 MG PO CAPS
ORAL_CAPSULE | ORAL | Status: AC
  Filled 2020-12-19: qty 1

## 2020-12-19 MED ORDER — LIDOCAINE HCL (PF) 2 % IJ SOLN
INTRAMUSCULAR | Status: AC
  Filled 2020-12-19: qty 5

## 2020-12-19 MED ORDER — ROPIVACAINE HCL 5 MG/ML IJ SOLN
INTRAMUSCULAR | Status: DC | PRN
  Administered 2020-12-19: 30 mL

## 2020-12-19 MED ORDER — OXYCODONE HCL 5 MG/5ML PO SOLN: 5.0000 mg | Freq: Once | ORAL | Status: DC | PRN

## 2020-12-19 MED ORDER — ONDANSETRON HCL 4 MG/2ML IJ SOLN
INTRAMUSCULAR | Status: DC | PRN
  Administered 2020-12-19: 4 mg via INTRAVENOUS

## 2020-12-19 MED ORDER — LACTATED RINGERS IV SOLN: INTRAVENOUS | Status: DC

## 2020-12-19 MED ORDER — PROPOFOL 10 MG/ML IV BOLUS
INTRAVENOUS | Status: AC
  Filled 2020-12-19: qty 20

## 2020-12-19 MED ORDER — PROPOFOL 10 MG/ML IV BOLUS
INTRAVENOUS | Status: DC | PRN
  Administered 2020-12-19: 130 mg via INTRAVENOUS

## 2020-12-19 MED ORDER — ACETAMINOPHEN 500 MG PO TABS
ORAL_TABLET | ORAL | Status: AC
  Filled 2020-12-19: qty 2

## 2020-12-19 MED ORDER — FENTANYL CITRATE (PF) 100 MCG/2ML IJ SOLN: 25.0000 ug | INTRAMUSCULAR | Status: DC | PRN

## 2020-12-19 MED ORDER — DEXAMETHASONE SODIUM PHOSPHATE 10 MG/ML IJ SOLN
INTRAMUSCULAR | Status: AC
  Filled 2020-12-19: qty 1

## 2020-12-19 MED ORDER — FENTANYL CITRATE (PF) 100 MCG/2ML IJ SOLN
INTRAMUSCULAR | Status: AC
  Filled 2020-12-19: qty 2

## 2020-12-19 MED ORDER — CEFAZOLIN SODIUM-DEXTROSE 2-4 GM/100ML-% IV SOLN
INTRAVENOUS | Status: AC
  Filled 2020-12-19: qty 100

## 2020-12-19 MED ORDER — CHLORHEXIDINE GLUCONATE CLOTH 2 % EX PADS: 6.0000 | MEDICATED_PAD | Freq: Once | CUTANEOUS | Status: DC

## 2020-12-19 MED ORDER — HYDROCODONE-ACETAMINOPHEN 5-325 MG PO TABS: 1.0000 | ORAL_TABLET | Freq: Four times a day (QID) | ORAL | 0 refills | Status: DC | PRN

## 2020-12-19 MED ORDER — FENTANYL CITRATE (PF) 100 MCG/2ML IJ SOLN
100.0000 ug | Freq: Once | INTRAMUSCULAR | Status: AC
Start: 2020-12-19 — End: 2020-12-19
  Administered 2020-12-19: 75 ug via INTRAVENOUS

## 2020-12-19 MED ORDER — FENTANYL CITRATE (PF) 100 MCG/2ML IJ SOLN
INTRAMUSCULAR | Status: DC | PRN
  Administered 2020-12-19: 25 ug via INTRAVENOUS

## 2020-12-19 MED ORDER — CEFAZOLIN SODIUM-DEXTROSE 2-4 GM/100ML-% IV SOLN
2.0000 g | INTRAVENOUS | Status: AC
  Administered 2020-12-19: 2 g via INTRAVENOUS

## 2020-12-19 MED ORDER — ACETAMINOPHEN 500 MG PO TABS
1000.0000 mg | ORAL_TABLET | ORAL | Status: AC
  Administered 2020-12-19: 1000 mg via ORAL

## 2020-12-19 MED ORDER — SODIUM CHLORIDE 0.9 % IR SOLN
Status: DC | PRN
  Administered 2020-12-19: 500 mL

## 2020-12-19 MED ORDER — DEXAMETHASONE SODIUM PHOSPHATE 10 MG/ML IJ SOLN
INTRAMUSCULAR | Status: DC | PRN
  Administered 2020-12-19: 10 mg via INTRAVENOUS

## 2020-12-19 SURGICAL SUPPLY — 49 items
ADH SKN CLS APL DERMABOND .7 (GAUZE/BANDAGES/DRESSINGS) ×1
APL PRP STRL LF DISP 70% ISPRP (MISCELLANEOUS) ×2
BLADE CLIPPER SENSICLIP SURGIC (BLADE) ×2 IMPLANT
BLADE HEX COATED 2.75 (ELECTRODE) ×2 IMPLANT
BLADE SURG 15 STRL LF DISP TIS (BLADE) ×1 IMPLANT
BLADE SURG 15 STRL SS (BLADE) ×2
CANISTER SUCT 3000ML PPV (MISCELLANEOUS) IMPLANT
CELLS DAT CNTRL 66122 CELL SVR (MISCELLANEOUS) IMPLANT
CHLORAPREP W/TINT 26 (MISCELLANEOUS) ×4 IMPLANT
COVER BACK TABLE 60X90IN (DRAPES) ×2 IMPLANT
COVER MAYO STAND STRL (DRAPES) ×2 IMPLANT
COVER WAND RF STERILE (DRAPES) ×2 IMPLANT
DERMABOND ADVANCED (GAUZE/BANDAGES/DRESSINGS) ×1
DERMABOND ADVANCED .7 DNX12 (GAUZE/BANDAGES/DRESSINGS) ×1 IMPLANT
DRAIN PENROSE 0.25X18 (DRAIN) ×2 IMPLANT
DRAPE LAPAROSCOPIC ABDOMINAL (DRAPES) ×2 IMPLANT
DRAPE UTILITY XL STRL (DRAPES) ×2 IMPLANT
ELECT REM PT RETURN 9FT ADLT (ELECTROSURGICAL) ×2
ELECTRODE REM PT RTRN 9FT ADLT (ELECTROSURGICAL) ×1 IMPLANT
GLOVE SURG POLYISO LF SZ7 (GLOVE) ×2 IMPLANT
GLOVE SURG UNDER POLY LF SZ7 (GLOVE) ×2 IMPLANT
GOWN STRL REUS W/TWL LRG LVL3 (GOWN DISPOSABLE) ×2 IMPLANT
KIT TURNOVER CYSTO (KITS) ×2 IMPLANT
NEEDLE HYPO 25X1 1.5 SAFETY (NEEDLE) IMPLANT
NS IRRIG 500ML POUR BTL (IV SOLUTION) ×2 IMPLANT
PACK BASIN DAY SURGERY FS (CUSTOM PROCEDURE TRAY) ×2 IMPLANT
PAD ARMBOARD 7.5X6 YLW CONV (MISCELLANEOUS) ×2 IMPLANT
PENCIL SMOKE EVACUATOR (MISCELLANEOUS) ×2 IMPLANT
RTRCTR WOUND ALEXIS 18CM MED (MISCELLANEOUS)
SPONGE LAP 4X18 RFD (DISPOSABLE) IMPLANT
SUT MNCRL AB 4-0 PS2 18 (SUTURE) ×2 IMPLANT
SUT PDS AB 0 CT1 36 (SUTURE) IMPLANT
SUT PDS AB 2-0 CT2 27 (SUTURE) ×4 IMPLANT
SUT PROLENE 2 0 CT2 30 (SUTURE) IMPLANT
SUT SILK 3 0 TIES 17X18 (SUTURE)
SUT SILK 3-0 18XBRD TIE BLK (SUTURE) IMPLANT
SUT VIC AB 2-0 SH 18 (SUTURE) ×2 IMPLANT
SUT VIC AB 2-0 SH 27 (SUTURE) ×2
SUT VIC AB 2-0 SH 27XBRD (SUTURE) ×1 IMPLANT
SUT VIC AB 3-0 SH 27 (SUTURE) ×2
SUT VIC AB 3-0 SH 27X BRD (SUTURE) ×1 IMPLANT
SUT VICRYL 2 0 18  UND BR (SUTURE) ×1
SUT VICRYL 2 0 18 UND BR (SUTURE) ×1 IMPLANT
SYR BULB EAR ULCER 3OZ GRN STR (SYRINGE) ×2 IMPLANT
SYR BULB IRRIG 60ML STRL (SYRINGE) IMPLANT
SYR CONTROL 10ML LL (SYRINGE) ×2 IMPLANT
TOWEL OR 17X26 10 PK STRL BLUE (TOWEL DISPOSABLE) ×2 IMPLANT
TUBE CONNECTING 12X1/4 (SUCTIONS) ×2 IMPLANT
YANKAUER SUCT BULB TIP NO VENT (SUCTIONS) ×2 IMPLANT

## 2020-12-19 NOTE — Transfer of Care (Signed)
Immediate Anesthesia Transfer of Care Note  Patient: Samuel [name]  Procedure(s) Performed: LEFT OPEN INGUINAL HERNIA REPAIR WITHOUT MESH (Left )  Patient Location: PACU  Anesthesia Type:GA combined with regional for post-op pain  Level of Consciousness: awake, alert  and oriented  Airway & Oxygen Therapy: Patient Spontanous Breathing and Patient connected to face mask oxygen  Post-op Assessment: Report given to RN and Post -op Vital signs reviewed and stable  Post vital signs: Reviewed and stable  Last Vitals:  Vitals Value Taken Time  BP 133/66 12/19/20 1245  Temp    Pulse 62 12/19/20 1255  Resp 8 12/19/20 1255  SpO2 96 % 12/19/20 1255  Vitals shown include unvalidated device data.  Last Pain:  Vitals:   12/19/20 1115  TempSrc:   PainSc: 0-No pain      Patients Stated Pain Goal: 0 (38/10/17 5102)  Complications: No complications documented.

## 2020-12-19 NOTE — Anesthesia Procedure Notes (Signed)
Anesthesia Procedure Image    

## 2020-12-19 NOTE — Anesthesia Preprocedure Evaluation (Signed)
Anesthesia Evaluation  Patient identified by MRN, date of birth, ID band Patient awake    Reviewed: Allergy & Precautions, H&P , NPO status , Patient's Chart, lab work & pertinent test results  Airway Mallampati: II  TM Distance: >3 FB Neck ROM: Full    Dental no notable dental hx.    Pulmonary neg pulmonary ROS, former smoker,    Pulmonary exam normal breath sounds clear to auscultation       Cardiovascular hypertension, + Peripheral Vascular Disease  Normal cardiovascular exam+ dysrhythmias + Valvular Problems/Murmurs MR  Rhythm:Regular Rate:Normal + Systolic murmurs S/P MVR   Neuro/Psych negative neurological ROS  negative psych ROS   GI/Hepatic negative GI ROS, Neg liver ROS,   Endo/Other  Hypothyroidism   Renal/GU negative Renal ROS  negative genitourinary   Musculoskeletal negative musculoskeletal ROS (+)   Abdominal   Peds negative pediatric ROS (+)  Hematology negative hematology ROS (+)   Anesthesia Other Findings   Reproductive/Obstetrics negative OB ROS                             Anesthesia Physical Anesthesia Plan  ASA: III  Anesthesia Plan: General   Post-op Pain Management:  Regional for Post-op pain   Induction: Intravenous  PONV Risk Score and Plan: 2 and Ondansetron, Dexamethasone and Treatment may vary due to age or medical condition  Airway Management Planned: LMA  Additional Equipment:   Intra-op Plan:   Post-operative Plan: Extubation in OR  Informed Consent: I have reviewed the patients History and Physical, chart, labs and discussed the procedure including the risks, benefits and alternatives for the proposed anesthesia with the patient or authorized representative who has indicated his/her understanding and acceptance.     Dental advisory given  Plan Discussed with: CRNA and Surgeon  Anesthesia Plan Comments:         Anesthesia Quick  Evaluation

## 2020-12-19 NOTE — H&P (Signed)
Samuel [name] is an 87 y.o. male.   Chief Complaint: hernia HPI: 87 yo male with multiple medical problems presents with left inguinal hernia. He has some discomfort with exercise and presents for repair.  Past Medical History:  Diagnosis Date  . Aortoiliac occlusive disease ([place])   . Carotid artery disease ([place])   . Complication of anesthesia 1955   sodium pentathol ?, N/V  . Contracture of palmar fascia 06/21/2010  . Dysrhythmia    PAF  . Enthesopathy of hip region 11/27/2009  . Essential hypertension, benign 06/21/2010  . GERD (gastroesophageal reflux disease)    occ  . Head injury with loss of consciousness ([place]) 2001   short period  . Heart murmur   . Hyperlipidemia LDL goal <100 06/21/2010  . Hypertrophy of prostate without urinary obstruction and other lower urinary tract symptoms (LUTS) 06/21/2010  . Hypothyroidism 08/27/2011  . Malignant neoplasm of prostate ([place]) 06/21/2010  . Mitral regurgitation 10/07/2015   Severe MR noted on ECHO with partially flail post leaflet Oct 03 2015   . Osteoarthrosis, unspecified whether generalized or localized, unspecified site 06/21/2010  . Other and unspecified hyperlipidemia 06/21/2010  . Pain in joint, lower leg 02/25/2012  . Palpitations 08/14/2010  . Patent foramen ovale 01/04/2016   Closed at the time of mitral valve repair   . PONV (postoperative nausea and vomiting)   . S/P minimally invasive mitral valve repair 01/04/2016   Complex valvuloplasty including triangular resection of flail segment of posterior leaflet, artificial Gore-tex neochord placement x6 and 12mm Sorin Memo 3D ring annuloplasty via right mini thoracotomy approach with closure of PFO and clipping of LA appendage   . Unspecified constipation 06/21/2010  . Unspecified essential hypertension 06/21/2010  . Unspecified hypothyroidism 08/27/2011  . Urinary frequency 08/14/2008    Past Surgical History:  Procedure Laterality Date  . CARDIAC CATHETERIZATION N/A 11/10/2015    Procedure: Right/Left Heart Cath and Coronary Angiography;  Surgeon: Peter M Martinique, MD;  Location: [facility];  Service: Cardiovascular;  Laterality: N/A;  . CATARACT EXTRACTION W/ INTRAOCULAR LENS  IMPLANT, BILATERAL  2006  . CLIPPING OF ATRIAL APPENDAGE N/A 01/04/2016   Procedure: CLIPPING OF ATRIAL APPENDAGE;  Surgeon: Rexene Alberts, MD;  Location: [facility];  Service: Open Heart Surgery;  Laterality: N/A;  . COLONOSCOPY    . KNEE ARTHROSCOPY WITH MENISCAL REPAIR Left 1968  . MITRAL VALVE REPAIR Right 01/04/2016   Procedure: MINIMALLY INVASIVE MITRAL VALVE REPAIR (MVR);  Surgeon: Rexene Alberts, MD;  Location: [facility];  Service: Open Heart Surgery;  Laterality: Right;  . Open knee- ligament repair Right 1955   Dr. Ethel Rana  . PROSTATE SURGERY  2000   seed implants  . REVERSE SHOULDER ARTHROPLASTY Right 08/12/2020   Procedure: REVERSE SHOULDER ARTHROPLASTY;  Surgeon: Justice Britain, MD;  Location: [facility];  Service: Orthopedics;  Laterality: Right;  114min  . TEE WITHOUT CARDIOVERSION N/A 11/10/2015   Procedure: TRANSESOPHAGEAL ECHOCARDIOGRAM (TEE);  Surgeon: Skeet Latch, MD;  Location: [facility];  Service: Cardiovascular;  Laterality: N/A;  . TEE WITHOUT CARDIOVERSION N/A 01/04/2016   Procedure: TRANSESOPHAGEAL ECHOCARDIOGRAM (TEE);  Surgeon: Rexene Alberts, MD;  Location: [facility];  Service: Open Heart Surgery;  Laterality: N/A;    Family History  Problem Relation Age of Onset  . Heart disease Mother        myocardial infarction  . Heart disease Father        myocardial infarction   Social History:  reports that he  quit smoking about 51 years ago. His smoking use included cigarettes and pipe. He has a 2.50 pack-year smoking history. He has never used smokeless tobacco. He reports current alcohol use of about 14.0 standard drinks of alcohol per week. He reports that he does not use drugs.  Allergies: No Known Allergies  Medications Prior to Admission  Medication Sig Dispense  Refill  . alfuzosin (UROXATRAL) 10 MG 24 hr tablet Take 10 mg by mouth daily with breakfast. to reduce bladder spasm    . aspirin 81 MG tablet Take 81 mg by mouth every other day. for anticoagulation    . furosemide (LASIX) 40 MG tablet Take 1 tablet (40 mg total) by mouth daily. 90 tablet 1  . losartan (COZAAR) 25 MG tablet TAKE 1 TABLET DAILY (Patient taking differently: daily.) 90 tablet 3  . metoprolol succinate (TOPROL-XL) 25 MG 24 hr tablet Take 12.5 mg by mouth daily.    . nabumetone (RELAFEN) 750 MG tablet TAKE 1 TABLET TWICE A DAY (Patient taking differently: 2 (two) times daily.) 180 tablet 3  . simvastatin (ZOCOR) 40 MG tablet Take 20 mg by mouth daily.    Marland Kitchen TIROSINT 50 MCG CAPS TAKE 1 CAPSULE DAILY BEFORE BREAKFAST (Patient taking differently: Levothyroxine sodium) 90 capsule 3  . VESICARE 5 MG tablet Take 5 mg by mouth daily.       Results for orders placed or performed during the hospital encounter of 12/19/20 (from the past 48 hour(s))  I-STAT, chem 8     Status: Abnormal   Collection Time: 12/19/20 10:16 AM  Result Value Ref Range   Sodium 141 135 - 145 mmol/L   Potassium 3.8 3.5 - 5.1 mmol/L   Chloride 103 98 - 111 mmol/L   BUN 26 (H) 8 - 23 mg/dL   Creatinine, Ser 1.30 (H) 0.61 - 1.24 mg/dL   Glucose, Bld 100 (H) 70 - 99 mg/dL    Comment: Glucose reference range applies only to samples taken after fasting for at least 8 hours.   Calcium, Ion 1.22 1.15 - 1.40 mmol/L   TCO2 26 22 - 32 mmol/L   Hemoglobin 11.6 (L) 13.0 - 17.0 g/dL   HCT 34.0 (L) 39.0 - 52.0 %   No results found.  Review of Systems  Constitutional: Negative for chills and fever.  HENT: Negative for hearing loss.   Respiratory: Negative for cough.   Cardiovascular: Negative for chest pain and palpitations.  Gastrointestinal: Negative for abdominal pain, nausea and vomiting.  Genitourinary: Negative for dysuria and urgency.  Musculoskeletal: Negative for myalgias and neck pain.  Skin: Negative for  rash.  Neurological: Negative for dizziness and headaches.  Hematological: Does not bruise/bleed easily.  Psychiatric/Behavioral: Negative for suicidal ideas.    Blood pressure (!) 142/80, pulse 67, temperature (!) 97.3 F (36.3 C), temperature source Oral, resp. rate 16, height 5\' 7"  (1.702 m), weight 69.3 kg, SpO2 100 %. Physical Exam Vitals reviewed.  Constitutional:      Appearance: He is well-developed and well-nourished.  HENT:     Head: Normocephalic and atraumatic.  Eyes:     Extraocular Movements: EOM normal.     Conjunctiva/sclera: Conjunctivae normal.     Pupils: Pupils are equal, round, and reactive to light.  Cardiovascular:     Rate and Rhythm: Normal rate and regular rhythm.  Pulmonary:     Effort: Pulmonary effort is normal.     Breath sounds: Normal breath sounds.  Abdominal:     General: Bowel sounds  are normal. There is no distension.     Palpations: Abdomen is soft.     Tenderness: There is no abdominal tenderness.     Comments: Left inguinal hernia  Musculoskeletal:        General: Normal range of motion.     Cervical back: Normal range of motion and neck supple.  Skin:    General: Skin is warm and dry.  Neurological:     Mental Status: He is alert and oriented to person, place, and time.  Psychiatric:        Mood and Affect: Mood and affect normal.        Behavior: Behavior normal.      Assessment/Plan 87 yo male with inguinal hernia -left open inguinal hernia repair without mesh -planned outpatient procedure  Mickeal Skinner, MD 12/19/2020, 11:20 AM

## 2020-12-19 NOTE — Anesthesia Postprocedure Evaluation (Signed)
Anesthesia Post Note  Patient: Samuel [name]  Procedure(s) Performed: LEFT OPEN INGUINAL HERNIA REPAIR WITHOUT MESH (Left )     Patient location during evaluation: PACU Anesthesia Type: General Level of consciousness: awake and alert Pain management: pain level controlled Vital Signs Assessment: post-procedure vital signs reviewed and stable Respiratory status: spontaneous breathing, nonlabored ventilation, respiratory function stable and patient connected to nasal cannula oxygen Cardiovascular status: blood pressure returned to baseline and stable Postop Assessment: no apparent nausea or vomiting Anesthetic complications: no   No complications documented.  Last Vitals:  Vitals:   12/19/20 1300 12/19/20 1315  BP: 127/60 131/69  Pulse: (!) 58 (!) 56  Resp: 12 10  Temp:    SpO2: 97% 94%    Last Pain:  Vitals:   12/19/20 1315  TempSrc:   PainSc: 5                  Merla Sawka S

## 2020-12-19 NOTE — Anesthesia Procedure Notes (Signed)
Procedure Name: LMA Insertion Date/Time: 12/19/2020 11:36 AM Performed by: Genelle Bal, CRNA Pre-anesthesia Checklist: Patient identified, Emergency Drugs available, Suction available and Patient being monitored Patient Re-evaluated:Patient Re-evaluated prior to induction Oxygen Delivery Method: Circle system utilized Preoxygenation: Pre-oxygenation with 100% oxygen Induction Type: IV induction Ventilation: Mask ventilation without difficulty LMA: LMA inserted LMA Size: 4.0 Number of attempts: 1 Airway Equipment and Method: Bite block Placement Confirmation: positive ETCO2 Tube secured with: Tape Dental Injury: Teeth and Oropharynx as per pre-operative assessment

## 2020-12-19 NOTE — Progress Notes (Signed)
Assisted Dr. Rose with left, ultrasound guided, transabdominal plane block. Side rails up, monitors on throughout procedure. See vital signs in flow sheet. Tolerated Procedure well.  

## 2020-12-19 NOTE — Op Note (Signed)
Preop diagnosis: left inguinal hernia  Postop diagnosis: left indirect inguinal hernia  Procedure: open Left inguinal hernia repair  Surgeon: Gurney Maxin, M.D.  Asst: none  Anesthesia: Gen.   Indications for procedure: Samuel [name] is a 87 y.o. male with symptoms of pain and enlarging Left inguinal hernia(s). After discussing risks, alternatives and benefits he decided on open repair and was brought to day surgery for repair.  Description of procedure: The patient was brought into the operative suite, placed supine. Anesthesia was administered with endotracheal tube. Patient was strapped in place. The patient was prepped and draped in the usual sterile fashion.  The anterior superior iliac spine and pubic tubercle were identified on the Left side. An incision was made 1cm above the connecting line, representative of the location of the inguinal ligament. The subcutaneous tissue was bluntly dissected, scarpa's fascia was dissected away. The external abdominal oblique fascia was identified and sharply opened down to the external inguinal ring. The conjoint tendon and inguinal ligament were identified. The cord structures and sac were dissected free of the surrounding tissue in 360 degrees. A penrose drain was used to encircle the contents. The cremasteric fibers were dissected free of the contents of the cord and hernia sac. The cord structures (vessels and vas deferens) were identified and carefully dissected away from the hernia sac. The hernia sac was reduced and contained no visceral structures.The hernia sac was dissected down to the internal inguinal ring. Preperitoneal fat was identified showing appropriate dissection. The sac was then reduced into the preperitoneal space. The hernia was indirect. The sac was ligated with 2-0 vicryl and the base placed into the preperitoneal space. The inguinal ligament was sutured to the conjoint tendon with interrupted 2-0 PDS. 2 2-0 prolene sutures were  also used in this suturing so some permanent stitch would be present. Sutures were placed 5 mm apart. Cord structures are running in a neutral position through the mesh. Next the external abdominal oblique fascia was closed with a 2-0 Vicryl in interrupted fashion to re-create the external inguinal ring. Scarpa's fascia was closed with 3-0 Vicryl in running fashion. Skin was closed with a 4-0 Monocryl subcuticular stitch in running fashion. Dermabond place for dressing. Patient woke from anesthesia and brought to PACU in stable condition. All counts are correct.  Findings: left inguinal hernia  Specimen: none  Blood loss: 10 ml  Local anesthesia: preoperative TAP block  Complications: none  Implant: none  Gurney Maxin, M.D. General, Bariatric, & Minimally Invasive Surgery [facility], [place] 12:38 PM 12/19/2020

## 2020-12-19 NOTE — Anesthesia Procedure Notes (Signed)
Anesthesia Regional Block: TAP block   Pre-Anesthetic Checklist: ,, timeout performed, Correct Patient, Correct Site, Correct Laterality, Correct Procedure, Correct Position, site marked, Risks and benefits discussed,  Surgical consent,  Pre-op evaluation,  At surgeon's request and post-op pain management  Laterality: Left  Prep: chloraprep       Needles:  Injection technique: Single-shot  Needle Type: Echogenic Needle     Needle Length: 9cm      Additional Needles:   Procedures:,,,, ultrasound used (permanent image in chart),,,,  Narrative:  Start time: 12/19/2020 11:00 AM End time: 12/19/2020 11:10 AM Injection made incrementally with aspirations every 5 mL.  Performed by: Personally  Anesthesiologist: Myrtie Soman, MD  Additional Notes: Patient tolerated the procedure well without complications

## 2020-12-19 NOTE — Discharge Instructions (Signed)
[facility] _______[facility]  UMBILICAL OR INGUINAL HERNIA REPAIR: POST OP INSTRUCTIONS  Always review your discharge instruction sheet given to you by the facility where your surgery was performed. IF YOU HAVE DISABILITY OR FAMILY LEAVE FORMS, YOU MUST BRING THEM TO THE OFFICE FOR PROCESSING.   DO NOT GIVE THEM TO YOUR DOCTOR.  1. A  prescription for pain medication may be given to you upon discharge.  Take your pain medication as prescribed, if needed.  If narcotic pain medicine is not needed, then you may take acetaminophen (Tylenol) or ibuprofen (Advil) as needed. 2. Take your usually prescribed medications unless otherwise directed. If you need a refill on your pain medication, please contact your pharmacy.  They will contact our office to request authorization. Prescriptions will not be filled after 5 pm or on week-ends. 3. You should follow a light diet the first 24 hours after arrival home, such as soup and crackers, etc.  Be sure to include lots of fluids daily.  Resume your normal diet the day after surgery. 4.Most patients will experience some swelling and bruising around the umbilicus or in the groin and scrotum.  Ice packs and reclining will help.  Swelling and bruising can take several days to resolve.  6. It is common to experience some constipation if taking pain medication after surgery.  Increasing fluid intake and taking a stool softener (such as Colace) will usually help or prevent this problem from occurring.  A mild laxative (Milk of Magnesia or Miralax) should be taken according to package directions if there are no bowel movements after 48 hours. 7. Unless discharge instructions indicate otherwise, you may remove your bandages 24-48 hours after surgery, and you may shower at that time.  You may have steri-strips (small skin tapes) in place directly over the incision.  These strips should be left on the skin for 7-10 days.  If your surgeon used skin glue on the  incision, you may shower in 24 hours.  The glue will flake off over the next 2-3 weeks.  Any sutures or staples will be removed at the office during your follow-up visit. 8. ACTIVITIES:  You may resume regular (light) daily activities beginning the next day--such as daily self-care, walking, climbing stairs--gradually increasing activities as tolerated.  You may have sexual intercourse when it is comfortable.  Refrain from any heavy lifting or straining until approved by your doctor.  a.You may drive when you are no longer taking prescription pain medication, you can comfortably wear a seatbelt, and you can safely maneuver your car and apply brakes. b.RETURN TO WORK_____________________________________________  9.You should see your doctor in the office for a follow-up appointment approximately 2-3 weeks after your surgery.  Make sure that you call for this appointment within a day or two after you arrive home to insure a convenient appointment time. 10.OTHER INSTRUCTIONS: _____________________________________________________________  WHEN TO CALL YOUR DOCTOR: 1. Fever over 101.0 2. Inability to urinate 3. Nausea and/or vomiting 4. Extreme swelling or bruising 5. Continued bleeding from incision. 6. Increased pain, redness, or drainage from the incision  The clinic staff is available to answer your questions during regular business hours.  Please don't hesitate to call and ask to speak to one of the nurses for clinical concerns.  If you have a medical emergency, go to the nearest emergency room or call 911.  A surgeon from [facility] is always on call at the hospital   [address], [place],  [place]  [address] ?  [address], [place], [place]   [phone] ? [phone] ? FAX [phone] Web site: [email or web address]   Post Anesthesia Home Care Instructions  Activity: Get plenty of rest for the remainder of the day. A responsible  individual must stay with you for 24 hours following the procedure.  For the next 24 hours, DO NOT: -Drive a car -Paediatric nurse -Drink alcoholic beverages -Take any medication unless instructed by your physician -Make any legal decisions or sign important papers.  Meals: Start with liquid foods such as gelatin or soup. Progress to regular foods as tolerated. Avoid greasy, spicy, heavy foods. If nausea and/or vomiting occur, drink only clear liquids until the nausea and/or vomiting subsides. Call your physician if vomiting continues.  Special Instructions/Symptoms: Your throat may feel dry or sore from the anesthesia or the breathing tube placed in your throat during surgery. If this causes discomfort, gargle with warm salt water. The discomfort should disappear within 24 hours.  No ibuprofen, Advil, Aleve, Motrin, or naproxen until after 3:45 pm today if needed. No Tylenol until after 3:45 pm today if needed.     Regional Anesthesia Blocks  1. Numbness or the inability to move the "blocked" extremity may last from 3-48 hours after placement. The length of time depends on the medication injected and your individual response to the medication. If the numbness is not going away after 48 hours, call your surgeon.  2. The extremity that is blocked will need to be protected until the numbness is gone and the  Strength has returned. Because you cannot feel it, you will need to take extra care to avoid injury. Because it may be weak, you may have difficulty moving it or using it. You may not know what position it is in without looking at it while the block is in effect.  3. For blocks in the legs and feet, returning to weight bearing and walking needs to be done carefully. You will need to wait until the numbness is entirely gone and the strength has returned. You should be able to move your leg and foot normally before you try and bear weight or walk. You will need someone to be with you when you  first try to ensure you do not fall and possibly risk injury.  4. Bruising and tenderness at the needle site are common side effects and will resolve in a few days.  5. Persistent numbness or new problems with movement should be communicated to the surgeon.

## 2020-12-20 ENCOUNTER — Encounter (HOSPITAL_BASED_OUTPATIENT_CLINIC_OR_DEPARTMENT_OTHER): Payer: Self-pay | Admitting: General Surgery

## 2020-12-20 NOTE — Addendum Note (Signed)
Addendum  created 12/20/20 0658 by Genelle Bal, CRNA   Charge Capture section accepted

## 2021-01-21 ENCOUNTER — Other Ambulatory Visit: Payer: Self-pay | Admitting: Internal Medicine

## 2021-01-30 DIAGNOSIS — Z96611 Presence of right artificial shoulder joint: Secondary | ICD-10-CM | POA: Diagnosis not present

## 2021-01-31 ENCOUNTER — Other Ambulatory Visit: Payer: Self-pay

## 2021-01-31 MED ORDER — SIMVASTATIN 40 MG PO TABS: 20.0000 mg | ORAL_TABLET | Freq: Every day | ORAL | 1 refills | Status: DC

## 2021-01-31 MED ORDER — NABUMETONE 750 MG PO TABS: 750.0000 mg | ORAL_TABLET | Freq: Two times a day (BID) | ORAL | 3 refills | Status: DC

## 2021-01-31 NOTE — Telephone Encounter (Signed)
Patient has request refill on medications "Nabumetone 750mg " and "Simvastatin 40mg ". Patient has selected you as PCP Virgie Dad, MD . Medications pend and sent to PCP.

## 2021-02-07 ENCOUNTER — Other Ambulatory Visit: Payer: Self-pay | Admitting: Internal Medicine

## 2021-02-08 DIAGNOSIS — L570 Actinic keratosis: Secondary | ICD-10-CM | POA: Diagnosis not present

## 2021-02-08 DIAGNOSIS — Z85068 Personal history of other malignant neoplasm of small intestine: Secondary | ICD-10-CM | POA: Diagnosis not present

## 2021-02-08 DIAGNOSIS — L814 Other melanin hyperpigmentation: Secondary | ICD-10-CM | POA: Diagnosis not present

## 2021-03-01 DIAGNOSIS — Z85068 Personal history of other malignant neoplasm of small intestine: Secondary | ICD-10-CM | POA: Diagnosis not present

## 2021-03-01 DIAGNOSIS — L570 Actinic keratosis: Secondary | ICD-10-CM | POA: Diagnosis not present

## 2021-03-01 DIAGNOSIS — L814 Other melanin hyperpigmentation: Secondary | ICD-10-CM | POA: Diagnosis not present

## 2021-03-03 ENCOUNTER — Other Ambulatory Visit: Payer: Self-pay | Admitting: Internal Medicine

## 2021-03-03 ENCOUNTER — Other Ambulatory Visit: Payer: Self-pay

## 2021-03-03 NOTE — Telephone Encounter (Signed)
Patient called requesting a 30 day supply of Tirosint to [facility] and also a mail order supply to [facility]. Please approve. He is completely out. To Dr. Lyndel Safe

## 2021-03-06 MED ORDER — LEVOTHYROXINE SODIUM 50 MCG PO CAPS: ORAL_CAPSULE | ORAL | 3 refills | Status: DC

## 2021-03-13 DIAGNOSIS — Z961 Presence of intraocular lens: Secondary | ICD-10-CM | POA: Diagnosis not present

## 2021-04-21 ENCOUNTER — Encounter: Payer: Self-pay | Admitting: Internal Medicine

## 2021-04-21 ENCOUNTER — Other Ambulatory Visit: Payer: Self-pay

## 2021-04-21 ENCOUNTER — Non-Acute Institutional Stay: Payer: Medicare Other | Admitting: Internal Medicine

## 2021-04-21 VITALS — BP 120/76 | HR 66 | Temp 96.8°F | Ht 67.0 in | Wt 150.2 lb

## 2021-04-21 DIAGNOSIS — E039 Hypothyroidism, unspecified: Secondary | ICD-10-CM

## 2021-04-21 DIAGNOSIS — N1831 Chronic kidney disease, stage 3a: Secondary | ICD-10-CM | POA: Diagnosis not present

## 2021-04-21 DIAGNOSIS — R609 Edema, unspecified: Secondary | ICD-10-CM

## 2021-04-21 DIAGNOSIS — I480 Paroxysmal atrial fibrillation: Secondary | ICD-10-CM

## 2021-04-21 DIAGNOSIS — E7800 Pure hypercholesterolemia, unspecified: Secondary | ICD-10-CM

## 2021-04-21 DIAGNOSIS — M130 Polyarthritis, unspecified: Secondary | ICD-10-CM | POA: Diagnosis not present

## 2021-04-21 MED ORDER — FUROSEMIDE 40 MG PO TABS: 40.0000 mg | ORAL_TABLET | Freq: Every day | ORAL | 1 refills | Status: DC

## 2021-04-21 NOTE — Progress Notes (Signed)
Location:  [facility] of Service:  Clinic (12)  Provider:   Code Status:  Goals of Care:  Advanced Directives 04/21/2021  Does Patient Have a Medical Advance Directive? Yes  Type of Paramedic of [place];Living will  Does patient want to make changes to medical advance directive? No - Patient declined  Copy of [place] in Chart? -     Chief Complaint  Patient presents with   Medical Management of Chronic Issues    Patient returns to the clinic for follow up.    HPI: Patient is a 87 y.o. male seen today for medical management of chronic diseases.    Came for Follow Up Lost his Wife in May if this year  He has h/o Arthritis Takes Relafen for past 8-9 years. Tapering has not helped LE edema on Lasix BPH with Frequency  Recent Inguinal Hernia repair  H/o Right Shoulder Arthroplasty MVR repair  Doing well. Mood is good. Appetite is good. Very active NO other issues today  Past Medical History:  Diagnosis Date   Aortoiliac occlusive disease ([place])    Carotid artery disease ([place])    Complication of anesthesia 1955   sodium pentathol ?, N/V   Contracture of palmar fascia 06/21/2010   Dysrhythmia    PAF   Enthesopathy of hip region 11/27/2009   Essential hypertension, benign 06/21/2010   GERD (gastroesophageal reflux disease)    occ   Head injury with loss of consciousness ([place]) 2001   short period   Heart murmur    Hyperlipidemia LDL goal <100 06/21/2010   Hypertrophy of prostate without urinary obstruction and other lower urinary tract symptoms (LUTS) 06/21/2010   Hypothyroidism 08/27/2011   Malignant neoplasm of prostate ([place]) 06/21/2010   Mitral regurgitation 10/07/2015   Severe MR noted on ECHO with partially flail post leaflet Oct 03 2015    Osteoarthrosis, unspecified whether generalized or localized, unspecified site 06/21/2010   Other and unspecified hyperlipidemia 06/21/2010   Pain in joint,  lower leg 02/25/2012   Palpitations 08/14/2010   Patent foramen ovale 01/04/2016   Closed at the time of mitral valve repair    PONV (postoperative nausea and vomiting)    S/P minimally invasive mitral valve repair 01/04/2016   Complex valvuloplasty including triangular resection of flail segment of posterior leaflet, artificial Gore-tex neochord placement x6 and 34mm Sorin Memo 3D ring annuloplasty via right mini thoracotomy approach with closure of PFO and clipping of LA appendage    Unspecified constipation 06/21/2010   Unspecified essential hypertension 06/21/2010   Unspecified hypothyroidism 08/27/2011   Urinary frequency 08/14/2008    Past Surgical History:  Procedure Laterality Date   CARDIAC CATHETERIZATION N/A 11/10/2015   Procedure: Right/Left Heart Cath and Coronary Angiography;  Surgeon: Peter M Martinique, MD;  Location: [facility];  Service: Cardiovascular;  Laterality: N/A;   CATARACT EXTRACTION W/ INTRAOCULAR LENS  IMPLANT, BILATERAL  2006   CLIPPING OF ATRIAL APPENDAGE N/A 01/04/2016   Procedure: CLIPPING OF ATRIAL APPENDAGE;  Surgeon: Rexene Alberts, MD;  Location: [facility];  Service: Open Heart Surgery;  Laterality: N/A;   COLONOSCOPY     INGUINAL HERNIA REPAIR Left 12/19/2020   Procedure: LEFT OPEN INGUINAL HERNIA REPAIR WITHOUT MESH;  Surgeon: Kinsinger, Arta Bruce, MD;  Location: [facility];  Service: General;  Laterality: Left;  ROOM 3 STARTING AT 11:30AM FOR 60 MIN   KNEE ARTHROSCOPY WITH MENISCAL REPAIR Left 1968  MITRAL VALVE REPAIR Right 01/04/2016   Procedure: MINIMALLY INVASIVE MITRAL VALVE REPAIR (MVR);  Surgeon: Rexene Alberts, MD;  Location: [facility];  Service: Open Heart Surgery;  Laterality: Right;   Open knee- ligament repair Right 1955   Dr. Ethel Rana   PROSTATE SURGERY  2000   seed implants   REVERSE SHOULDER ARTHROPLASTY Right 08/12/2020   Procedure: REVERSE SHOULDER ARTHROPLASTY;  Surgeon: Justice Britain, MD;  Location: [facility];  Service:  Orthopedics;  Laterality: Right;  150min   TEE WITHOUT CARDIOVERSION N/A 11/10/2015   Procedure: TRANSESOPHAGEAL ECHOCARDIOGRAM (TEE);  Surgeon: Skeet Latch, MD;  Location: [facility];  Service: Cardiovascular;  Laterality: N/A;   TEE WITHOUT CARDIOVERSION N/A 01/04/2016   Procedure: TRANSESOPHAGEAL ECHOCARDIOGRAM (TEE);  Surgeon: Rexene Alberts, MD;  Location: [facility];  Service: Open Heart Surgery;  Laterality: N/A;    No Known Allergies  Outpatient Encounter Medications as of 04/21/2021  Medication Sig   alfuzosin (UROXATRAL) 10 MG 24 hr tablet Take 10 mg by mouth daily with breakfast. to reduce bladder spasm   aspirin 81 MG tablet Take 81 mg by mouth every other day. for anticoagulation   Levothyroxine Sodium (TIROSINT) 50 MCG CAPS TAKE 1 CAPSULE DAILY BEFORE BREAKFAST   losartan (COZAAR) 25 MG tablet TAKE 1 TABLET DAILY (Patient taking differently: daily.)   metoprolol succinate (TOPROL-XL) 25 MG 24 hr tablet Take 12.5 mg by mouth daily.   nabumetone (RELAFEN) 750 MG tablet Take 1 tablet (750 mg total) by mouth 2 (two) times daily.   simvastatin (ZOCOR) 40 MG tablet Take 0.5 tablets (20 mg total) by mouth daily.   VESICARE 5 MG tablet Take 5 mg by mouth daily.    [DISCONTINUED] furosemide (LASIX) 40 MG tablet Take 1 tablet (40 mg total) by mouth daily.   furosemide (LASIX) 40 MG tablet Take 1 tablet (40 mg total) by mouth daily.   [DISCONTINUED] HYDROcodone-acetaminophen (NORCO/VICODIN) 5-325 MG tablet Take 1 tablet by mouth every 6 (six) hours as needed for moderate pain. (Patient not taking: Reported on 04/21/2021)   No facility-administered encounter medications on file as of 04/21/2021.    Review of Systems:  Review of Systems Review of Systems  Constitutional: Negative for activity change, appetite change, chills, diaphoresis, fatigue and fever.  HENT: Negative for mouth sores, postnasal drip, rhinorrhea, sinus pain and sore throat.   Respiratory: Negative for apnea, cough,  chest tightness, shortness of breath and wheezing.   Cardiovascular: Negative for chest pain, palpitations and leg swelling.  Gastrointestinal: Negative for abdominal distention, abdominal pain, constipation, diarrhea, nausea and vomiting.  Genitourinary: Negative for dysuria and frequency.  Musculoskeletal: Negative for arthralgias, joint swelling and myalgias.  Skin: Negative for rash.  Neurological: Negative for dizziness, syncope, weakness, light-headedness and numbness.  Psychiatric/Behavioral: Negative for behavioral problems, confusion and sleep disturbance.    Health Maintenance  Topic Date Due   COVID-19 Vaccine (4 - Booster for Moderna series) 12/07/2020   INFLUENZA VACCINE  05/22/2021   TETANUS/TDAP  02/27/2025   PNA vac Low Risk Adult  Completed   Zoster Vaccines- Shingrix  Completed   HPV VACCINES  Aged Out    Physical Exam: Vitals:   04/21/21 1059  BP: 120/76  Pulse: 66  Temp: (!) 96.8 F (36 C)  SpO2: 95%  Weight: 150 lb 3.2 oz (68.1 kg)  Height: 5\' 7"  (1.702 m)   Body mass index is 23.52 kg/m. Physical Exam Constitutional: Oriented to person, place, and time. Well-developed and well-nourished.  HENT:  Head:  Normocephalic.  Mouth/Throat: Oropharynx is clear and moist.  Eyes: Pupils are equal, round, and reactive to light.  Neck: Neck supple.  Cardiovascular: Normal rate and normal heart sounds.  No murmur heard. Pulmonary/Chest: Effort normal and breath sounds normal. No respiratory distress. No wheezes.  has no rales.  Abdominal: Soft. Bowel sounds are normal. No distension. There is no tenderness. There is no rebound.  Musculoskeletal: Mild Edema Bilateral Left more then Right  Right Shoulder Restricted Mobility Lymphadenopathy: none Neurological: Alert and oriented to person, place, and time.  Skin: Skin is warm and dry.  Psychiatric: Normal mood and affect. Behavior is normal. Thought content normal.   Labs reviewed: Basic Metabolic  Panel: Recent Labs    08/12/20 0919 11/24/20 0000 12/19/20 1016  NA 138 140 141  K 3.7 4.7 3.8  CL 106 104 103  CO2 24 24*  --   GLUCOSE 131*  --  100*  BUN 18 26* 26*  CREATININE 1.30* 1.4* 1.30*  CALCIUM 8.6* 8.8  --   TSH  --  4.46  --    Liver Function Tests: Recent Labs    11/24/20 0000  AST 34  ALT 27  ALKPHOS 113  ALBUMIN 4.0   No results for input(s): LIPASE, AMYLASE in the last 8760 hours. No results for input(s): AMMONIA in the last 8760 hours. CBC: Recent Labs    08/12/20 0919 11/24/20 0000 12/19/20 1016  WBC 5.9 3.9  --   HGB 10.8* 12.7* 11.6*  HCT 33.3* 37* 34.0*  MCV 94.6  --   --   PLT 189 223  --    Lipid Panel: Recent Labs    11/24/20 0000  CHOL 156  HDL 68  LDLCALC 73  TRIG 77   Lab Results  Component Value Date   HGBA1C 5.5 01/02/2016    Procedures since last visit: No results found.  Assessment/Plan Stage 3a chronic kidney disease ([place]) - Plan: CBC with Differential/Platelet, COMPLETE METABOLIC PANEL WITH GFR Patient is on Relafen and Lasix D/w him Creat stable  Peripheral edema - Plan: furosemide (LASIX) 40 MG tablet, CBC with Differential/Platelet, COMPLETE METABOLIC PANEL WITH GFR Continue on Lasix  Hypothyroidism, unspecified type - Plan: TSH Repeat TSH Hypertension On Cozaar and Toprol PAF (paroxysmal atrial fibrillation) ([place]) Only happened Post op  On Aspirin and Toprol  Pure hypercholesterolemia - Plan: Lipid panel Good levels in Statin Arthritis of multiple sites Take Relafen Say he cannot live without it  BPH On Uroxatral and Vesicare   Labs/tests ordered:  * No order type specified * Next appt:  Visit date not found

## 2021-04-25 DIAGNOSIS — M2040 Other hammer toe(s) (acquired), unspecified foot: Secondary | ICD-10-CM | POA: Diagnosis not present

## 2021-04-25 DIAGNOSIS — B351 Tinea unguium: Secondary | ICD-10-CM | POA: Diagnosis not present

## 2021-05-10 DIAGNOSIS — Z85068 Personal history of other malignant neoplasm of small intestine: Secondary | ICD-10-CM | POA: Diagnosis not present

## 2021-05-10 DIAGNOSIS — L570 Actinic keratosis: Secondary | ICD-10-CM | POA: Diagnosis not present

## 2021-05-10 DIAGNOSIS — L814 Other melanin hyperpigmentation: Secondary | ICD-10-CM | POA: Diagnosis not present

## 2021-05-10 DIAGNOSIS — L821 Other seborrheic keratosis: Secondary | ICD-10-CM | POA: Diagnosis not present

## 2021-05-26 ENCOUNTER — Other Ambulatory Visit (HOSPITAL_BASED_OUTPATIENT_CLINIC_OR_DEPARTMENT_OTHER): Payer: Self-pay

## 2021-05-26 ENCOUNTER — Other Ambulatory Visit: Payer: Self-pay

## 2021-05-26 ENCOUNTER — Ambulatory Visit: Payer: Medicare Other | Attending: Internal Medicine

## 2021-05-26 DIAGNOSIS — Z23 Encounter for immunization: Secondary | ICD-10-CM

## 2021-05-26 MED ORDER — COVID-19 MRNA VACC (MODERNA) 100 MCG/0.5ML IM SUSP
INTRAMUSCULAR | 0 refills | Status: DC
  Filled 2021-05-26: qty 0.25, 1d supply, fill #0

## 2021-05-26 NOTE — Progress Notes (Signed)
   Covid-19 Vaccination Clinic  Name:  Samuel [name]    MRN: [id number] DOB: Apr 10, 1932  05/26/2021  [name] was observed post Covid-19 immunization for 15 minutes without incident. He was provided with Vaccine Information Sheet and instruction to access the V-Safe system.   [name] was instructed to call 911 with any severe reactions post vaccine: Difficulty breathing  Swelling of face and throat  A fast heartbeat  A bad rash all over body  Dizziness and weakness   Immunizations Administered     Name Date Dose VIS Date Route   Moderna Covid-19 Booster Vaccine 05/26/2021  9:36 AM 0.25 mL 08/10/2020 Intramuscular   Manufacturer: Moderna   Lot: [id number]   [id number]

## 2021-06-07 DIAGNOSIS — Z85068 Personal history of other malignant neoplasm of small intestine: Secondary | ICD-10-CM | POA: Diagnosis not present

## 2021-06-07 DIAGNOSIS — L821 Other seborrheic keratosis: Secondary | ICD-10-CM | POA: Diagnosis not present

## 2021-06-07 DIAGNOSIS — L814 Other melanin hyperpigmentation: Secondary | ICD-10-CM | POA: Diagnosis not present

## 2021-06-07 DIAGNOSIS — L570 Actinic keratosis: Secondary | ICD-10-CM | POA: Diagnosis not present

## 2021-07-25 DIAGNOSIS — E039 Hypothyroidism, unspecified: Secondary | ICD-10-CM | POA: Diagnosis not present

## 2021-07-25 DIAGNOSIS — I10 Essential (primary) hypertension: Secondary | ICD-10-CM | POA: Diagnosis not present

## 2021-07-25 LAB — BASIC METABOLIC PANEL
BUN: 23 — AB (ref 4–21)
CO2: 26 — AB (ref 13–22)
Chloride: 105 (ref 99–108)
Creatinine: 1.4 — AB (ref 0.6–1.3)
Glucose: 83
Potassium: 4.6 (ref 3.4–5.3)
Sodium: 141 (ref 137–147)

## 2021-07-25 LAB — LIPID PANEL
Cholesterol: 161 (ref 0–200)
HDL: 68 (ref 35–70)
LDL Cholesterol: 78
LDl/HDL Ratio: 2.4
Triglycerides: 74 (ref 40–160)

## 2021-07-25 LAB — COMPREHENSIVE METABOLIC PANEL
Albumin: 4.1 (ref 3.5–5.0)
Calcium: 9 (ref 8.7–10.7)
Globulin: 2.5

## 2021-07-25 LAB — HEPATIC FUNCTION PANEL
ALT: 25 (ref 10–40)
AST: 36 (ref 14–40)
Alkaline Phosphatase: 96 (ref 25–125)
Bilirubin, Total: 0.4

## 2021-07-25 LAB — TSH: TSH: 5.13 (ref 0.41–5.90)

## 2021-08-16 DIAGNOSIS — Z8546 Personal history of malignant neoplasm of prostate: Secondary | ICD-10-CM | POA: Diagnosis not present

## 2021-08-23 ENCOUNTER — Encounter: Payer: Self-pay | Admitting: Internal Medicine

## 2021-08-23 ENCOUNTER — Non-Acute Institutional Stay: Payer: Medicare Other | Admitting: Internal Medicine

## 2021-08-23 ENCOUNTER — Other Ambulatory Visit: Payer: Self-pay

## 2021-08-23 VITALS — BP 136/74 | HR 75 | Temp 97.7°F | Ht 67.0 in | Wt 160.6 lb

## 2021-08-23 DIAGNOSIS — M130 Polyarthritis, unspecified: Secondary | ICD-10-CM

## 2021-08-23 DIAGNOSIS — M1712 Unilateral primary osteoarthritis, left knee: Secondary | ICD-10-CM

## 2021-08-23 DIAGNOSIS — R600 Localized edema: Secondary | ICD-10-CM | POA: Diagnosis not present

## 2021-08-23 DIAGNOSIS — E039 Hypothyroidism, unspecified: Secondary | ICD-10-CM

## 2021-08-23 DIAGNOSIS — I480 Paroxysmal atrial fibrillation: Secondary | ICD-10-CM | POA: Diagnosis not present

## 2021-08-23 DIAGNOSIS — E7800 Pure hypercholesterolemia, unspecified: Secondary | ICD-10-CM

## 2021-08-23 DIAGNOSIS — N400 Enlarged prostate without lower urinary tract symptoms: Secondary | ICD-10-CM | POA: Diagnosis not present

## 2021-08-23 DIAGNOSIS — N1831 Chronic kidney disease, stage 3a: Secondary | ICD-10-CM | POA: Diagnosis not present

## 2021-08-23 DIAGNOSIS — R350 Frequency of micturition: Secondary | ICD-10-CM | POA: Diagnosis not present

## 2021-08-23 DIAGNOSIS — N401 Enlarged prostate with lower urinary tract symptoms: Secondary | ICD-10-CM | POA: Diagnosis not present

## 2021-08-23 DIAGNOSIS — Z8546 Personal history of malignant neoplasm of prostate: Secondary | ICD-10-CM | POA: Diagnosis not present

## 2021-08-23 MED ORDER — TORSEMIDE 20 MG PO TABS: 20.0000 mg | ORAL_TABLET | Freq: Every day | ORAL | 1 refills | Status: DC

## 2021-08-23 NOTE — Progress Notes (Signed)
Location: [facility] of Service:  Clinic (12)  Provider:   Code Status:  Goals of Care:  Advanced Directives 04/21/2021  Does Patient Have a Medical Advance Directive? Yes  Type of Paramedic of [place];Living will  Does patient want to make changes to medical advance directive? No - Patient declined  Copy of [place] in Chart? -     Chief Complaint  Patient presents with   Acute Visit    Knee issues after recent surgery. BIL ankle swelling.    HPI: Patient is a 87 y.o. male seen today for an acute visit for Left knee pain and LE swelling Bilateral   He has h/o Arthritis Takes Relafen for past 8-9 years. Tapering has not helped LE edema on Lasix BPH with Frequency  Recent Inguinal Hernia repair  H/o Right Shoulder Arthroplasty MVR repair CKD Stage 3 A  Bilateral LE edema Worsening recently Does not take his Lasix as thinks does not help Wants something stronger No ted hoses. Does not like them No SOB or Cough or Chest pain Has gained weight  Left Leg arthritis C/O Worsening pain recently Wants to know if he should see Orthopedics No Falls Walks well No Cane or Walker No swelling in his knee   Past Medical History:  Diagnosis Date   Aortoiliac occlusive disease ([place])    Carotid artery disease ([place])    Complication of anesthesia 1955   sodium pentathol ?, N/V   Contracture of palmar fascia 06/21/2010   Dysrhythmia    PAF   Enthesopathy of hip region 11/27/2009   Essential hypertension, benign 06/21/2010   GERD (gastroesophageal reflux disease)    occ   Head injury with loss of consciousness ([place]) 2001   short period   Heart murmur    Hyperlipidemia LDL goal <100 06/21/2010   Hypertrophy of prostate without urinary obstruction and other lower urinary tract symptoms (LUTS) 06/21/2010   Hypothyroidism 08/27/2011   Malignant neoplasm of prostate ([place]) 06/21/2010   Mitral regurgitation  10/07/2015   Severe MR noted on ECHO with partially flail post leaflet Oct 03 2015    Osteoarthrosis, unspecified whether generalized or localized, unspecified site 06/21/2010   Other and unspecified hyperlipidemia 06/21/2010   Pain in joint, lower leg 02/25/2012   Palpitations 08/14/2010   Patent foramen ovale 01/04/2016   Closed at the time of mitral valve repair    PONV (postoperative nausea and vomiting)    S/P minimally invasive mitral valve repair 01/04/2016   Complex valvuloplasty including triangular resection of flail segment of posterior leaflet, artificial Gore-tex neochord placement x6 and 69mm Sorin Memo 3D ring annuloplasty via right mini thoracotomy approach with closure of PFO and clipping of LA appendage    Unspecified constipation 06/21/2010   Unspecified essential hypertension 06/21/2010   Unspecified hypothyroidism 08/27/2011   Urinary frequency 08/14/2008    Past Surgical History:  Procedure Laterality Date   CARDIAC CATHETERIZATION N/A 11/10/2015   Procedure: Right/Left Heart Cath and Coronary Angiography;  Surgeon: Peter M Martinique, MD;  Location: [facility];  Service: Cardiovascular;  Laterality: N/A;   CATARACT EXTRACTION W/ INTRAOCULAR LENS  IMPLANT, BILATERAL  2006   CLIPPING OF ATRIAL APPENDAGE N/A 01/04/2016   Procedure: CLIPPING OF ATRIAL APPENDAGE;  Surgeon: Rexene Alberts, MD;  Location: [facility];  Service: Open Heart Surgery;  Laterality: N/A;   COLONOSCOPY     INGUINAL HERNIA REPAIR Left 12/19/2020   Procedure: LEFT  OPEN INGUINAL HERNIA REPAIR WITHOUT MESH;  Surgeon: Kinsinger, Arta Bruce, MD;  Location: [facility];  Service: General;  Laterality: Left;  ROOM 3 STARTING AT 11:30AM FOR 60 MIN   KNEE ARTHROSCOPY WITH MENISCAL REPAIR Left 1968   MITRAL VALVE REPAIR Right 01/04/2016   Procedure: MINIMALLY INVASIVE MITRAL VALVE REPAIR (MVR);  Surgeon: Rexene Alberts, MD;  Location: [facility];  Service: Open Heart Surgery;  Laterality: Right;   Open knee-  ligament repair Right 1955   Dr. Ethel Rana   PROSTATE SURGERY  2000   seed implants   REVERSE SHOULDER ARTHROPLASTY Right 08/12/2020   Procedure: REVERSE SHOULDER ARTHROPLASTY;  Surgeon: Justice Britain, MD;  Location: [facility];  Service: Orthopedics;  Laterality: Right;  173min   TEE WITHOUT CARDIOVERSION N/A 11/10/2015   Procedure: TRANSESOPHAGEAL ECHOCARDIOGRAM (TEE);  Surgeon: Skeet Latch, MD;  Location: [facility];  Service: Cardiovascular;  Laterality: N/A;   TEE WITHOUT CARDIOVERSION N/A 01/04/2016   Procedure: TRANSESOPHAGEAL ECHOCARDIOGRAM (TEE);  Surgeon: Rexene Alberts, MD;  Location: [facility];  Service: Open Heart Surgery;  Laterality: N/A;    No Known Allergies  Outpatient Encounter Medications as of 08/23/2021  Medication Sig   aspirin 81 MG tablet Take 81 mg by mouth every other day. for anticoagulation   COVID-19 mRNA vaccine, Moderna, 100 MCG/0.5ML injection Inject into the muscle.   Levothyroxine Sodium (TIROSINT) 50 MCG CAPS TAKE 1 CAPSULE DAILY BEFORE BREAKFAST   losartan (COZAAR) 25 MG tablet TAKE 1 TABLET DAILY   metoprolol succinate (TOPROL-XL) 25 MG 24 hr tablet Take 12.5 mg by mouth daily.   nabumetone (RELAFEN) 750 MG tablet Take 1 tablet (750 mg total) by mouth 2 (two) times daily.   simvastatin (ZOCOR) 40 MG tablet Take 0.5 tablets (20 mg total) by mouth daily.   tamsulosin (FLOMAX) 0.4 MG CAPS capsule Take 0.4 mg by mouth daily.   torsemide (DEMADEX) 20 MG tablet Take 1 tablet (20 mg total) by mouth daily.   VESICARE 5 MG tablet Take 5 mg by mouth daily.    [DISCONTINUED] alfuzosin (UROXATRAL) 10 MG 24 hr tablet Take 10 mg by mouth daily with breakfast. to reduce bladder spasm   [DISCONTINUED] furosemide (LASIX) 40 MG tablet Take 1 tablet (40 mg total) by mouth daily.   No facility-administered encounter medications on file as of 08/23/2021.    Review of Systems:  Review of Systems  Constitutional:  Negative for activity change.  HENT: Negative.     Respiratory: Negative.    Cardiovascular:  Positive for leg swelling.  Gastrointestinal: Negative.   Genitourinary: Negative.   Musculoskeletal:  Positive for arthralgias and myalgias.  Skin: Negative.   Neurological: Negative.   Psychiatric/Behavioral: Negative.     Health Maintenance  Topic Date Due   INFLUENZA VACCINE  05/22/2021   COVID-19 Vaccine (4 - Booster for Moderna series) 07/21/2021   TETANUS/TDAP  02/27/2025   Pneumonia Vaccine 64+ Years old  Completed   Zoster Vaccines- Shingrix  Completed   HPV VACCINES  Aged Out    Physical Exam: Vitals:   08/23/21 0911  BP: 136/74  Pulse: 75  Temp: 97.7 F (36.5 C)  SpO2: 96%  Weight: 160 lb 9.6 oz (72.8 kg)  Height: 5\' 7"  (1.702 m)   Body mass index is 25.15 kg/m. Physical Exam Constitutional: Oriented to person, place, and time. Well-developed and well-nourished.  HENT:  Head: Normocephalic.  Mouth/Throat: Oropharynx is clear and moist.  Eyes: Pupils are equal, round, and reactive to light.  Neck: Neck supple.  Cardiovascular: Normal rate and normal heart sounds.  No murmur heard. Pulmonary/Chest: Effort normal and breath sounds normal. No respiratory distress. No wheezes. She has no rales.  Abdominal: Soft. Bowel sounds are normal. No distension. There is no tenderness. There is no rebound.  Musculoskeletal: Moderate Edema Bilateral  Left Knee has no swelling Crepitus present Not tender or Red Lymphadenopathy: none Neurological: Alert and oriented to person, place, and time.  Skin: Skin is warm and dry.  Psychiatric: Normal mood and affect. Behavior is normal. Thought content normal.   Labs reviewed: Basic Metabolic Panel: Recent Labs    11/24/20 0000 12/19/20 1016 07/25/21 0000  NA 140 141 141  K 4.7 3.8 4.6  CL 104 103 105  CO2 24*  --  26*  GLUCOSE  --  100*  --   BUN 26* 26* 23*  CREATININE 1.4* 1.30* 1.4*  CALCIUM 8.8  --  9.0  TSH 4.46  --  5.13   Liver Function Tests: Recent Labs     11/24/20 0000 07/25/21 0000  AST 34 36  ALT 27 25  ALKPHOS 113 96  ALBUMIN 4.0 4.1   No results for input(s): LIPASE, AMYLASE in the last 8760 hours. No results for input(s): AMMONIA in the last 8760 hours. CBC: Recent Labs    11/24/20 0000 12/19/20 1016  WBC 3.9  --   HGB 12.7* 11.6*  HCT 37* 34.0*  PLT 223  --    Lipid Panel: Recent Labs    11/24/20 0000 07/25/21 0000  CHOL 156 161  HDL 68 68  LDLCALC 73 78  TRIG 77 74   Lab Results  Component Value Date   HGBA1C 5.5 01/02/2016    Procedures since last visit: No results found.  Assessment/Plan Bilateral leg edema Lasix does not help Will try Demadex Repeat BMP in 1 week Arthritis of left knee - Plan: Ambulatory referral to Orthopedic Surgery Takes relafen Hypothyroidism, unspecified type TSH mildily high Repeat in 6 months Same dose for now  Stage 3a chronic kidney disease ([place]) Repeat BMP on Demadex  Arthritis of multiple sites Take Relafen Say he cannot live without it   PAF (paroxysmal atrial fibrillation) ([place]) Only happened Post op  On Aspirin and Toprol  Pure hypercholesterolemia Good Levels on Statin BPH .Uroxatral and Vesicare   Labs/tests ordered:  BMP in 1 week Next appt:  10/30/2021

## 2021-08-23 NOTE — Patient Instructions (Signed)
Will do Blood Work in 1 week to check your Kidney Function

## 2021-08-28 ENCOUNTER — Encounter: Payer: Medicare Other | Admitting: Family

## 2021-08-28 ENCOUNTER — Encounter: Payer: Medicare Other | Admitting: Internal Medicine

## 2021-08-31 DIAGNOSIS — N1831 Chronic kidney disease, stage 3a: Secondary | ICD-10-CM | POA: Diagnosis not present

## 2021-08-31 LAB — BASIC METABOLIC PANEL
BUN: 27 — AB (ref 4–21)
CO2: 26 — AB (ref 13–22)
Chloride: 102 (ref 99–108)
Creatinine: 1.4 — AB (ref 0.6–1.3)
Glucose: 91
Potassium: 4.3 (ref 3.4–5.3)
Sodium: 139 (ref 137–147)

## 2021-09-01 LAB — COMPREHENSIVE METABOLIC PANEL
Calcium: 9.4 (ref 8.7–10.7)
GFR calc Af Amer: 52.17
GFR calc non Af Amer: 45.01

## 2021-09-05 DIAGNOSIS — M1712 Unilateral primary osteoarthritis, left knee: Secondary | ICD-10-CM | POA: Diagnosis not present

## 2021-09-05 DIAGNOSIS — M25562 Pain in left knee: Secondary | ICD-10-CM | POA: Diagnosis not present

## 2021-09-06 NOTE — Progress Notes (Signed)
Cardiology Office Note    Date:  09/20/2021   ID:  Samuel [name], DOB August 21, 1932, MRN [id number]   PCP:  Virgie Dad, MD   [place]  Cardiologist:  Candee Furbish, MD   Advanced Practice Provider:  No care team member to display Electrophysiologist:  None   [id number]}   Chief Complaint  Patient presents with   Follow-up     History of Present Illness:  Samuel [name] is a 87 y.o. male paroxysmal atrial fibrillation.  Minimally invasive MV repair,with clipping of left atrial appendage and Small PFO closure in 01/04/2016,no CAD on cath prior to surgery. HTN, carotid disease.   Catheterization previously showed normal LV function and no CAD.  Patient last saw Dr. Marlou Porch 07/2020 and heart rate was 115 but was felt to be sinus tachycardia.  He slowed down while in the office.  No changes made.  Recommended aspirin every other day.  Patient comes in for f/u. No longer on Toprol-he doesn't know who stopped it or when. Denies chest pain, dyspnea, palpitations, edema. Swims laps and machines 3 times/week. Had some leg swelling recently and on torsemide 10 mg 3 times/week. He loves salt and adds it without even tasting his food. Lives at [place] [facility].    Past Medical History:  Diagnosis Date   Aortoiliac occlusive disease ([place])    Carotid artery disease ([place])    Complication of anesthesia 1955   sodium pentathol ?, N/V   Contracture of palmar fascia 06/21/2010   Dysrhythmia    PAF   Enthesopathy of hip region 11/27/2009   Essential hypertension, benign 06/21/2010   GERD (gastroesophageal reflux disease)    occ   Head injury with loss of consciousness ([place]) 2001   short period   Heart murmur    Hyperlipidemia LDL goal <100 06/21/2010   Hypertrophy of prostate without urinary obstruction and other lower urinary tract symptoms (LUTS) 06/21/2010   Hypothyroidism 08/27/2011   Malignant neoplasm of prostate ([place]) 06/21/2010   Mitral regurgitation 10/07/2015    Severe MR noted on ECHO with partially flail post leaflet Oct 03 2015    Osteoarthrosis, unspecified whether generalized or localized, unspecified site 06/21/2010   Other and unspecified hyperlipidemia 06/21/2010   Pain in joint, lower leg 02/25/2012   Palpitations 08/14/2010   Patent foramen ovale 01/04/2016   Closed at the time of mitral valve repair    PONV (postoperative nausea and vomiting)    S/P minimally invasive mitral valve repair 01/04/2016   Complex valvuloplasty including triangular resection of flail segment of posterior leaflet, artificial Gore-tex neochord placement x6 and 74mm Sorin Memo 3D ring annuloplasty via right mini thoracotomy approach with closure of PFO and clipping of LA appendage    Unspecified constipation 06/21/2010   Unspecified essential hypertension 06/21/2010   Unspecified hypothyroidism 08/27/2011   Urinary frequency 08/14/2008    Past Surgical History:  Procedure Laterality Date   CARDIAC CATHETERIZATION N/A 11/10/2015   Procedure: Right/Left Heart Cath and Coronary Angiography;  Surgeon: Peter M Martinique, MD;  Location: [facility];  Service: Cardiovascular;  Laterality: N/A;   CATARACT EXTRACTION W/ INTRAOCULAR LENS  IMPLANT, BILATERAL  2006   CLIPPING OF ATRIAL APPENDAGE N/A 01/04/2016   Procedure: CLIPPING OF ATRIAL APPENDAGE;  Surgeon: Rexene Alberts, MD;  Location: [facility];  Service: Open Heart Surgery;  Laterality: N/A;   COLONOSCOPY     INGUINAL HERNIA REPAIR Left 12/19/2020   Procedure: LEFT OPEN INGUINAL HERNIA REPAIR WITHOUT MESH;  Surgeon: Kieth Brightly, Arta Bruce, MD;  Location: [facility];  Service: General;  Laterality: Left;  ROOM 3 STARTING AT 11:30AM FOR 60 MIN   KNEE ARTHROSCOPY WITH MENISCAL REPAIR Left 1968   MITRAL VALVE REPAIR Right 01/04/2016   Procedure: MINIMALLY INVASIVE MITRAL VALVE REPAIR (MVR);  Surgeon: Rexene Alberts, MD;  Location: [facility];  Service: Open Heart Surgery;  Laterality: Right;   Open knee- ligament  repair Right 1955   Dr. Ethel Rana   PROSTATE SURGERY  2000   seed implants   REVERSE SHOULDER ARTHROPLASTY Right 08/12/2020   Procedure: REVERSE SHOULDER ARTHROPLASTY;  Surgeon: Justice Britain, MD;  Location: [facility];  Service: Orthopedics;  Laterality: Right;  152min   TEE WITHOUT CARDIOVERSION N/A 11/10/2015   Procedure: TRANSESOPHAGEAL ECHOCARDIOGRAM (TEE);  Surgeon: Skeet Latch, MD;  Location: [facility];  Service: Cardiovascular;  Laterality: N/A;   TEE WITHOUT CARDIOVERSION N/A 01/04/2016   Procedure: TRANSESOPHAGEAL ECHOCARDIOGRAM (TEE);  Surgeon: Rexene Alberts, MD;  Location: [facility];  Service: Open Heart Surgery;  Laterality: N/A;    Current Medications: Current Meds  Medication Sig   alfuzosin (UROXATRAL) 10 MG 24 hr tablet Take 10 mg by mouth daily with breakfast.   amoxicillin (AMOXIL) 500 MG tablet Take 4 tablets 1 hour prior to dental procedures.   aspirin 81 MG tablet Take 81 mg by mouth every other day. for anticoagulation   COVID-19 mRNA vaccine, Moderna, 100 MCG/0.5ML injection Inject into the muscle.   Levothyroxine Sodium (TIROSINT) 50 MCG CAPS TAKE 1 CAPSULE DAILY BEFORE BREAKFAST   nabumetone (RELAFEN) 750 MG tablet Take 1 tablet (750 mg total) by mouth 2 (two) times daily.   simvastatin (ZOCOR) 40 MG tablet Take 0.5 tablets (20 mg total) by mouth daily.   torsemide (DEMADEX) 20 MG tablet Take 10 mg by mouth 3 (three) times a week. Take it as needed for LE swelling   [DISCONTINUED] losartan (COZAAR) 25 MG tablet TAKE 1 TABLET DAILY   [DISCONTINUED] nitroGLYCERIN (NITROSTAT) 0.4 MG SL tablet Place 0.4 mg under the tongue every 5 (five) minutes as needed for chest pain.     Allergies:   Patient has no known allergies.   Social History   Socioeconomic History   Marital status: Married    Spouse name: Not on file   Number of children: Not on file   Years of education: Not on file   Highest education level: Not on file  Occupational History   Occupation: retired  Engineer, materials at [employer] Use   Smoking status: Former    Packs/day: 0.50    Years: 5.00    Pack years: 2.50    Types: Cigarettes, Pipe    Quit date: 09/04/1969    Years since quitting: 52.0   Smokeless tobacco: Never  Vaping Use   Vaping Use: Never used  Substance and Sexual Activity   Alcohol use: Yes    Alcohol/week: 14.0 standard drinks    Types: 14 Shots of liquor per week    Comment: 2 drinks per day   Drug use: No   Sexual activity: Not on file  Other Topics Concern   Not on file  Social History Narrative   Lives at [facility] since 05/2010   Married [name]    Has living will, POA   Retired from [employer] after 29 years   Stopped smoking 1970   Exercise run 1 1/2 mile every other day, swim 15 minutes every other day, use machines in  gym   Alcohol 2 drinks daily   Social Determinants of Health   Financial Resource Strain: Not on file  Food Insecurity: Not on file  Transportation Needs: Not on file  Physical Activity: Not on file  Stress: Not on file  Social Connections: Not on file     Family History:  The patient's  family history includes Heart disease in his father and mother.   ROS:   Please see the history of present illness.    ROS All other systems reviewed and are negative.   PHYSICAL EXAM:   VS:  BP 116/68   Pulse 74   Ht 5\' 7"  (1.702 m)   Wt 158 lb 6.4 oz (71.8 kg)   SpO2 95%   BMI 24.81 kg/m   Physical Exam  OEV:OJJK, in no acute distress  Neck: no JVD, carotid bruits, or masses Cardiac:RRR; no murmurs, rubs, or gallops  Respiratory:  clear to auscultation bilaterally, normal work of breathing GI: soft, nontender, nondistended, + BS Ext: without cyanosis, clubbing, or edema, Good distal pulses bilaterally Neuro:  Alert and Oriented x 3 Psych: euthymic mood, full affect  Wt Readings from Last 3 Encounters:  09/20/21 158 lb 6.4 oz (71.8 kg)  09/20/21 158 lb 12.8 oz (72 kg)  08/23/21 160 lb 9.6 oz (72.8 kg)       Studies/Labs Reviewed:   EKG:  EKG is  ordered today.  The ekg ordered today demonstrates NSR with first degree AV block IVCD no change  Recent Labs: 11/24/2020: Platelets 223 12/19/2020: Hemoglobin 11.6 07/25/2021: ALT 25; TSH 5.13 08/31/2021: BUN 27; Creatinine 1.4; Potassium 4.3; Sodium 139   Lipid Panel    Component Value Date/Time   CHOL 161 07/25/2021 0000   TRIG 74 07/25/2021 0000   HDL 68 07/25/2021 0000   LDLCALC 78 07/25/2021 0000    Additional studies/ records that were reviewed today include:  2D echo 2018 moderate concentric LVH grade 1 DD, EF 55%, normally functioning prosthetic mitral valve repair with trace of regurgitation, mild TR  Carotid dopplers 01/02/16 Summary:   - Mild technical difficulty due to high bifurcations.  - Bilateral - 1% to 39% ICA stenosis. Vertebral artey flow is    antegrade.   Prepared and Electronically    Risk Assessment/Calculations:         ASSESSMENT:    1. S/P minimally invasive mitral valve repair   2. Paroxysmal atrial fibrillation (HCC)   3. Essential hypertension   4. Bilateral carotid artery stenosis   5. Hypothyroidism, unspecified type      PLAN:  In order of problems listed above:  Minimally invasive MV repair,with clipping of left atrial appendage and Small PFO closure in 01/04/2016 last echo 2018 stable.  Very mild murmur on exam and no symptoms.  Continue to follow.Dental prophylaxis-needs an implant-will Rx amoxicillin 2 gm 1 hr prior to procedure. Had extraction without SBE-educated about the importance of this.  PAF patient says 1 occurrence and has not recurred.  Was on Toprol in the past.  Had PAT when he was in the office last year.  He is not sure who stopped this or why.  I looked through his old chart and cannot see any reason he is off this but not having any fast heart rates and exercising regularly.  Will not restart.  Patient will look at his medications when he gets home and let us know.     HTN blood pressure well controlled on losartan-some recent leg swelling  on torsemide-eats a lot of salt-asked him to not salt his food before tasting.  Carotid disease-mild 2017 no bruits  Hypothyroidism TSH 5.1 recently followed by PCP   Shared Decision Making/Informed Consent        Medication Adjustments/Labs and Tests Ordered: Current medicines are reviewed at length with the patient today.  Concerns regarding medicines are outlined above.  Medication changes, Labs and Tests ordered today are listed in the Patient Instructions below. Patient Instructions  Medication Instructions:  Your physician recommends that you continue on your current medications as directed. Please refer to the Current Medication list given to you today.  *If you need a refill on your cardiac medications before your next appointment, please call your pharmacy*   Lab Work: None If you have labs (blood work) drawn today and your tests are completely normal, you will receive your results only by: [place] (if you have MyChart) OR A paper copy in the mail If you have any lab test that is abnormal or we need to change your treatment, we will call you to review the results.   Follow-Up: At [facility], you and your health needs are our priority.  As part of our continuing mission to provide you with exceptional heart care, we have created designated Provider Care Teams.  These Care Teams include your primary Cardiologist (physician) and Advanced Practice Providers (APPs -  Physician Assistants and Nurse Practitioners) who all work together to provide you with the care you need, when you need it.  Your next appointment:   1 year(s)  The format for your next appointment:   In Person  Provider:   Candee Furbish, MD     Signed, Ermalinda Barrios, PA-C  09/20/2021 11:36 AM    [facility] [address], [place], [place]  [address] Phone: [phone]; Fax: [phone]

## 2021-09-12 DIAGNOSIS — B351 Tinea unguium: Secondary | ICD-10-CM | POA: Diagnosis not present

## 2021-09-20 ENCOUNTER — Non-Acute Institutional Stay: Payer: Medicare Other | Admitting: Internal Medicine

## 2021-09-20 ENCOUNTER — Ambulatory Visit (INDEPENDENT_AMBULATORY_CARE_PROVIDER_SITE_OTHER): Payer: Medicare Other | Admitting: Physician Assistant

## 2021-09-20 ENCOUNTER — Other Ambulatory Visit: Payer: Self-pay

## 2021-09-20 ENCOUNTER — Encounter: Payer: Self-pay | Admitting: Internal Medicine

## 2021-09-20 ENCOUNTER — Encounter: Payer: Self-pay | Admitting: Physician Assistant

## 2021-09-20 VITALS — BP 118/70 | HR 71 | Temp 97.1°F | Ht 67.0 in | Wt 158.8 lb

## 2021-09-20 VITALS — BP 116/68 | HR 74 | Ht 67.0 in | Wt 158.4 lb

## 2021-09-20 DIAGNOSIS — I480 Paroxysmal atrial fibrillation: Secondary | ICD-10-CM

## 2021-09-20 DIAGNOSIS — I10 Essential (primary) hypertension: Secondary | ICD-10-CM

## 2021-09-20 DIAGNOSIS — Z9889 Other specified postprocedural states: Secondary | ICD-10-CM

## 2021-09-20 DIAGNOSIS — E039 Hypothyroidism, unspecified: Secondary | ICD-10-CM

## 2021-09-20 DIAGNOSIS — N1831 Chronic kidney disease, stage 3a: Secondary | ICD-10-CM

## 2021-09-20 DIAGNOSIS — M1712 Unilateral primary osteoarthritis, left knee: Secondary | ICD-10-CM

## 2021-09-20 DIAGNOSIS — R600 Localized edema: Secondary | ICD-10-CM | POA: Diagnosis not present

## 2021-09-20 DIAGNOSIS — I6523 Occlusion and stenosis of bilateral carotid arteries: Secondary | ICD-10-CM

## 2021-09-20 MED ORDER — LOSARTAN POTASSIUM 25 MG PO TABS: 12.5000 mg | ORAL_TABLET | Freq: Every day | ORAL | 3 refills | Status: DC

## 2021-09-20 MED ORDER — AMOXICILLIN 500 MG PO TABS: ORAL_TABLET | ORAL | 2 refills | Status: DC

## 2021-09-20 MED ORDER — TORSEMIDE 20 MG PO TABS: 10.0000 mg | ORAL_TABLET | ORAL | 0 refills | Status: DC

## 2021-09-20 MED ORDER — NITROGLYCERIN 0.4 MG SL SUBL: 0.4000 mg | SUBLINGUAL_TABLET | SUBLINGUAL | 3 refills | Status: DC | PRN

## 2021-09-20 NOTE — Patient Instructions (Signed)
Medication Instructions:  Your physician recommends that you continue on your current medications as directed. Please refer to the Current Medication list given to you today.  *If you need a refill on your cardiac medications before your next appointment, please call your pharmacy*   Lab Work: None If you have labs (blood work) drawn today and your tests are completely normal, you will receive your results only by: [place] (if you have MyChart) OR A paper copy in the mail If you have any lab test that is abnormal or we need to change your treatment, we will call you to review the results.   Follow-Up: At [facility], you and your health needs are our priority.  As part of our continuing mission to provide you with exceptional heart care, we have created designated Provider Care Teams.  These Care Teams include your primary Cardiologist (physician) and Advanced Practice Providers (APPs -  Physician Assistants and Nurse Practitioners) who all work together to provide you with the care you need, when you need it.  Your next appointment:   1 year(s)  The format for your next appointment:   In Person  Provider:   Candee Furbish, MD

## 2021-09-20 NOTE — Telephone Encounter (Signed)
Hello. [name] is here and stated he needs you to order 60 day supply for [facility] for Torsemide 20mg . ________________________________________ [place] Receptionist/Clinic Coordinator   Verbally approved for 90 day by Dr. Lyndel Safe.

## 2021-09-20 NOTE — Progress Notes (Addendum)
Location:  [place] of Service:  Clinic (12)  Provider:   Code Status:  Goals of Care:  Advanced Directives 04/21/2021  Does Patient Have a Medical Advance Directive? Yes  Type of Paramedic of [place];Living will  Does patient want to make changes to medical advance directive? No - Patient declined  Copy of [place] in Chart? -     Chief Complaint  Patient presents with   Medical Management of Chronic Issues    Patient returns to the clinic for follow up to discuss lab results and medications.    Quality Metric Gaps    Covid #5     HPI: Patient is a 87 y.o. male seen today for medical management of chronic diseases.    Came for follow up of his LE edema  He has h/o Arthritis Takes Relafen for past 8-9 years. Tapering has not helped LE edema  BPH with Frequency  Recent Inguinal Hernia repair  H/o Right Shoulder Arthroplasty MVR repair CKD Stage 3 A  Was started on Torsemide few weeks ago for worsening LE edema. Lasix was not working Patient says his swelling is much better and Legs back to normal He did feel some dizzy initially but then got better No SOB or cough  Also had Left knee injected which has helped his pain Past Medical History:  Diagnosis Date   Aortoiliac occlusive disease ([place])    Carotid artery disease ([place])    Complication of anesthesia 1955   sodium pentathol ?, N/V   Contracture of palmar fascia 06/21/2010   Dysrhythmia    PAF   Enthesopathy of hip region 11/27/2009   Essential hypertension, benign 06/21/2010   GERD (gastroesophageal reflux disease)    occ   Head injury with loss of consciousness ([place]) 2001   short period   Heart murmur    Hyperlipidemia LDL goal <100 06/21/2010   Hypertrophy of prostate without urinary obstruction and other lower urinary tract symptoms (LUTS) 06/21/2010   Hypothyroidism 08/27/2011   Malignant neoplasm of prostate ([place]) 06/21/2010    Mitral regurgitation 10/07/2015   Severe MR noted on ECHO with partially flail post leaflet Oct 03 2015    Osteoarthrosis, unspecified whether generalized or localized, unspecified site 06/21/2010   Other and unspecified hyperlipidemia 06/21/2010   Pain in joint, lower leg 02/25/2012   Palpitations 08/14/2010   Patent foramen ovale 01/04/2016   Closed at the time of mitral valve repair    PONV (postoperative nausea and vomiting)    S/P minimally invasive mitral valve repair 01/04/2016   Complex valvuloplasty including triangular resection of flail segment of posterior leaflet, artificial Gore-tex neochord placement x6 and 53mm Sorin Memo 3D ring annuloplasty via right mini thoracotomy approach with closure of PFO and clipping of LA appendage    Unspecified constipation 06/21/2010   Unspecified essential hypertension 06/21/2010   Unspecified hypothyroidism 08/27/2011   Urinary frequency 08/14/2008    Past Surgical History:  Procedure Laterality Date   CARDIAC CATHETERIZATION N/A 11/10/2015   Procedure: Right/Left Heart Cath and Coronary Angiography;  Surgeon: Peter M Martinique, MD;  Location: [facility];  Service: Cardiovascular;  Laterality: N/A;   CATARACT EXTRACTION W/ INTRAOCULAR LENS  IMPLANT, BILATERAL  2006   CLIPPING OF ATRIAL APPENDAGE N/A 01/04/2016   Procedure: CLIPPING OF ATRIAL APPENDAGE;  Surgeon: Rexene Alberts, MD;  Location: [facility];  Service: Open Heart Surgery;  Laterality: N/A;   COLONOSCOPY  INGUINAL HERNIA REPAIR Left 12/19/2020   Procedure: LEFT OPEN INGUINAL HERNIA REPAIR WITHOUT MESH;  Surgeon: Kinsinger, Arta Bruce, MD;  Location: [facility];  Service: General;  Laterality: Left;  ROOM 3 STARTING AT 11:30AM FOR 60 MIN   KNEE ARTHROSCOPY WITH MENISCAL REPAIR Left 1968   MITRAL VALVE REPAIR Right 01/04/2016   Procedure: MINIMALLY INVASIVE MITRAL VALVE REPAIR (MVR);  Surgeon: Rexene Alberts, MD;  Location: [facility];  Service: Open Heart Surgery;  Laterality:  Right;   Open knee- ligament repair Right 1955   Dr. Ethel Rana   PROSTATE SURGERY  2000   seed implants   REVERSE SHOULDER ARTHROPLASTY Right 08/12/2020   Procedure: REVERSE SHOULDER ARTHROPLASTY;  Surgeon: Justice Britain, MD;  Location: [facility];  Service: Orthopedics;  Laterality: Right;  169min   TEE WITHOUT CARDIOVERSION N/A 11/10/2015   Procedure: TRANSESOPHAGEAL ECHOCARDIOGRAM (TEE);  Surgeon: Skeet Latch, MD;  Location: [facility];  Service: Cardiovascular;  Laterality: N/A;   TEE WITHOUT CARDIOVERSION N/A 01/04/2016   Procedure: TRANSESOPHAGEAL ECHOCARDIOGRAM (TEE);  Surgeon: Rexene Alberts, MD;  Location: [facility];  Service: Open Heart Surgery;  Laterality: N/A;    No Known Allergies  Outpatient Encounter Medications as of 09/20/2021  Medication Sig   aspirin 81 MG tablet Take 81 mg by mouth every other day. for anticoagulation   COVID-19 mRNA vaccine, Moderna, 100 MCG/0.5ML injection Inject into the muscle.   Levothyroxine Sodium (TIROSINT) 50 MCG CAPS TAKE 1 CAPSULE DAILY BEFORE BREAKFAST   losartan (COZAAR) 25 MG tablet TAKE 1 TABLET DAILY   metoprolol succinate (TOPROL-XL) 25 MG 24 hr tablet Take 12.5 mg by mouth daily.   nabumetone (RELAFEN) 750 MG tablet Take 1 tablet (750 mg total) by mouth 2 (two) times daily.   simvastatin (ZOCOR) 40 MG tablet Take 0.5 tablets (20 mg total) by mouth daily.   tamsulosin (FLOMAX) 0.4 MG CAPS capsule Take 0.4 mg by mouth daily.   VESICARE 5 MG tablet Take 5 mg by mouth daily.    [DISCONTINUED] torsemide (DEMADEX) 20 MG tablet Take 1 tablet (20 mg total) by mouth daily.   No facility-administered encounter medications on file as of 09/20/2021.    Review of Systems:  Review of Systems Review of Systems  Constitutional: Negative for activity change, appetite change, chills, diaphoresis, fatigue and fever.  HENT: Negative for mouth sores, postnasal drip, rhinorrhea, sinus pain and sore throat.   Respiratory: Negative for apnea, cough, chest  tightness, shortness of breath and wheezing.   Cardiovascular: Negative for chest pain, palpitations and leg swelling.  Gastrointestinal: Negative for abdominal distention, abdominal pain, constipation, diarrhea, nausea and vomiting.  Genitourinary: Negative for dysuria and frequency.  Musculoskeletal: Negative for arthralgias, joint swelling and myalgias.  Skin: Negative for rash.  Neurological: Negative for dizziness, syncope, weakness, light-headedness and numbness.  Psychiatric/Behavioral: Negative for behavioral problems, confusion and sleep disturbance.    Health Maintenance  Topic Date Due   COVID-19 Vaccine (4 - Booster for Moderna series) 07/21/2021   TETANUS/TDAP  02/27/2025   Pneumonia Vaccine 43+ Years old  Completed   INFLUENZA VACCINE  Completed   Zoster Vaccines- Shingrix  Completed   HPV VACCINES  Aged Out    Physical Exam: Vitals:   09/20/21 0910  BP: 118/70  Pulse: 71  Temp: (!) 97.1 F (36.2 C)  SpO2: 94%  Weight: 158 lb 12.8 oz (72 kg)  Height: 5\' 7"  (1.702 m)   Body mass index is 24.87 kg/m. Physical Exam Vitals reviewed.  Constitutional:      Appearance: Normal appearance.  HENT:     Head: Normocephalic.     Mouth/Throat:     Mouth: Mucous membranes are moist.     Pharynx: Oropharynx is clear.  Eyes:     Pupils: Pupils are equal, round, and reactive to light.  Cardiovascular:     Rate and Rhythm: Normal rate and regular rhythm.     Pulses: Normal pulses.     Heart sounds: No murmur heard. Pulmonary:     Effort: Pulmonary effort is normal. No respiratory distress.     Breath sounds: Normal breath sounds. No rales.  Abdominal:     General: Abdomen is flat. Bowel sounds are normal.     Palpations: Abdomen is soft.  Musculoskeletal:     Cervical back: Neck supple.     Comments: Mild edema Bilateral   Skin:    General: Skin is warm.  Neurological:     General: No focal deficit present.     Mental Status: He is alert and oriented to person,  place, and time.  Psychiatric:        Mood and Affect: Mood normal.        Thought Content: Thought content normal.      Labs reviewed: Basic Metabolic Panel: Recent Labs    11/24/20 0000 12/19/20 1016 07/25/21 0000 08/31/21 0000 09/01/21 0000  NA 140 141 141 139  --   K 4.7 3.8 4.6 43.0*  --   CL 104 103 105 102  --   CO2 24*  --  26* 26*  --   GLUCOSE  --  100*  --   --   --   BUN 26* 26* 23* 27*  --   CREATININE 1.4* 1.30* 1.4* 1.4*  --   CALCIUM 8.8  --  9.0  --  9.4  TSH 4.46  --  5.13  --   --    Liver Function Tests: Recent Labs    11/24/20 0000 07/25/21 0000  AST 34 36  ALT 27 25  ALKPHOS 113 96  ALBUMIN 4.0 4.1   No results for input(s): LIPASE, AMYLASE in the last 8760 hours. No results for input(s): AMMONIA in the last 8760 hours. CBC: Recent Labs    11/24/20 0000 12/19/20 1016  WBC 3.9  --   HGB 12.7* 11.6*  HCT 37* 34.0*  PLT 223  --    Lipid Panel: Recent Labs    11/24/20 0000 07/25/21 0000  CHOL 156 161  HDL 68 68  LDLCALC 73 78  TRIG 77 74   Lab Results  Component Value Date   HGBA1C 5.5 01/02/2016    Procedures since last visit: No results found.  Assessment/Plan 1. Bilateral leg edema Use Torsemide 10 mg PRN 3/week BUN and Creat is stable 2. Arthritis of left knee S/p Ortho Injected Pain resolved for now  3. Stage 3a chronic kidney disease (HCC) Creat stable  4 . Essential Hypertension Slightly low today If stays low can back off on Cozaar next visit   Hypothyroidism,  TSH mildily high Repeat in 6 months Same dose for now   Arthritis of multiple sites Take Relafen Say he cannot live without it    PAF (paroxysmal atrial fibrillation) ([place]) Only happened Post op  On Aspirin and Toprol   Pure hypercholesterolemia Good Levels on Statin BPH .Uroxatral and Vesicare  Labs/tests ordered:  * No order type specified * Next appt:  10/30/2021

## 2021-10-04 ENCOUNTER — Other Ambulatory Visit (HOSPITAL_BASED_OUTPATIENT_CLINIC_OR_DEPARTMENT_OTHER): Payer: Self-pay

## 2021-10-04 ENCOUNTER — Ambulatory Visit: Payer: Medicare Other | Attending: Internal Medicine

## 2021-10-04 DIAGNOSIS — Z23 Encounter for immunization: Secondary | ICD-10-CM

## 2021-10-04 MED ORDER — MODERNA COVID-19 BIVAL BOOSTER 50 MCG/0.5ML IM SUSP
INTRAMUSCULAR | 0 refills | Status: DC
  Filled 2021-10-04: qty 0.5, 1d supply, fill #0

## 2021-10-04 NOTE — Progress Notes (Signed)
° °  Covid-19 Vaccination Clinic  Name:  Samuel [name]    MRN: [id number] DOB: 1932-06-07  10/04/2021  [name] was observed post Covid-19 immunization for 15 minutes without incident. He was provided with Vaccine Information Sheet and instruction to access the V-Safe system.   [name] was instructed to call 911 with any severe reactions post vaccine: Difficulty breathing  Swelling of face and throat  A fast heartbeat  A bad rash all over body  Dizziness and weakness   Immunizations Administered     Name Date Dose VIS Date Route   Moderna Covid-19 vaccine Bivalent Booster 10/04/2021 10:09 AM 0.5 mL 06/03/2021 Intramuscular   Manufacturer: Levan Hurst   Lot: [id number]   [place]: 29047-533-91

## 2021-10-24 DIAGNOSIS — M1712 Unilateral primary osteoarthritis, left knee: Secondary | ICD-10-CM | POA: Diagnosis not present

## 2021-10-24 DIAGNOSIS — R609 Edema, unspecified: Secondary | ICD-10-CM | POA: Diagnosis not present

## 2021-10-24 DIAGNOSIS — E039 Hypothyroidism, unspecified: Secondary | ICD-10-CM | POA: Diagnosis not present

## 2021-10-24 DIAGNOSIS — N1831 Chronic kidney disease, stage 3a: Secondary | ICD-10-CM | POA: Diagnosis not present

## 2021-10-24 DIAGNOSIS — E7800 Pure hypercholesterolemia, unspecified: Secondary | ICD-10-CM | POA: Diagnosis not present

## 2021-10-24 LAB — LIPID PANEL
Cholesterol: 185 (ref 0–200)
HDL: 63 (ref 35–70)
LDL Cholesterol: 100
LDl/HDL Ratio: 2.9
Triglycerides: 106 (ref 40–160)

## 2021-10-24 LAB — CBC AND DIFFERENTIAL
HCT: 37 — AB (ref 41–53)
Hemoglobin: 12.4 — AB (ref 13.5–17.5)
Platelets: 263 (ref 150–399)
WBC: 4.7

## 2021-10-24 LAB — BASIC METABOLIC PANEL
BUN: 28 — AB (ref 4–21)
CO2: 24 — AB (ref 13–22)
Chloride: 101 (ref 99–108)
Creatinine: 1.5 — AB (ref 0.6–1.3)
Glucose: 96
Potassium: 4.6 (ref 3.4–5.3)
Sodium: 141 (ref 137–147)

## 2021-10-24 LAB — HEPATIC FUNCTION PANEL
ALT: 23 (ref 10–40)
AST: 27 (ref 14–40)
Alkaline Phosphatase: 97 (ref 25–125)
Bilirubin, Total: 0.3

## 2021-10-24 LAB — TSH: TSH: 5.6 (ref 0.41–5.90)

## 2021-10-24 LAB — CBC: RBC: 3.99 (ref 3.87–5.11)

## 2021-10-24 LAB — COMPREHENSIVE METABOLIC PANEL
Albumin: 3.9 (ref 3.5–5.0)
Calcium: 9.1 (ref 8.7–10.7)
Globulin: 2.6

## 2021-10-25 ENCOUNTER — Other Ambulatory Visit: Payer: Self-pay | Admitting: Cardiology

## 2021-10-30 ENCOUNTER — Non-Acute Institutional Stay: Payer: Medicare Other | Admitting: Adult Health

## 2021-10-30 ENCOUNTER — Encounter: Payer: Self-pay | Admitting: Adult Health

## 2021-10-30 ENCOUNTER — Other Ambulatory Visit: Payer: Self-pay

## 2021-10-30 VITALS — BP 130/74 | HR 78 | Temp 97.7°F | Ht 67.0 in | Wt 159.0 lb

## 2021-10-30 DIAGNOSIS — E039 Hypothyroidism, unspecified: Secondary | ICD-10-CM | POA: Diagnosis not present

## 2021-10-30 DIAGNOSIS — I480 Paroxysmal atrial fibrillation: Secondary | ICD-10-CM | POA: Diagnosis not present

## 2021-10-30 DIAGNOSIS — I10 Essential (primary) hypertension: Secondary | ICD-10-CM | POA: Diagnosis not present

## 2021-10-30 DIAGNOSIS — E785 Hyperlipidemia, unspecified: Secondary | ICD-10-CM

## 2021-10-30 DIAGNOSIS — M159 Polyosteoarthritis, unspecified: Secondary | ICD-10-CM

## 2021-10-30 DIAGNOSIS — R600 Localized edema: Secondary | ICD-10-CM

## 2021-10-30 DIAGNOSIS — N1832 Chronic kidney disease, stage 3b: Secondary | ICD-10-CM

## 2021-10-30 NOTE — Progress Notes (Signed)
Location: [facility]  POS: clinic  Provider:  Cindi Carbon, [place] [id number]   Code Status:  Goals of Care:  Advanced Directives 04/21/2021  Does Patient Have a Medical Advance Directive? Yes  Type of Paramedic of [place];Living will  Does patient want to make changes to medical advance directive? No - Patient declined  Copy of [place] in Chart? -     Chief Complaint  Patient presents with   Medical Management of Chronic Issues    Patient returns to the clinic for his 6 month follow up.     HPI: Patient is a 88 y.o. male seen today for medical management of chronic diseases.   He has a hx of mitral valve and PFO repair, afib, HTN, OA, GERD, HLD, prostate ca, BPH with LUTS, hypothyroid.  He has not acute complaints for today's visit. He was started on torsemide three times weekly and has had complete resolution of edema in his legs. No sob or chest pain   Goes to see Dr. Ellie Lunch for ophthalmology check ups. Sees the dentist. Recommended to take amoxicillin for SBE prevention if having dental procedure.   BP is controlled   His wife passed away and now he lives by himself. He continues to exercise regularly. He gets out of the house and eats with friends. Denies any depression or difficulty sleeping.   LDL 100   TSH slightly elevated at 5.6 but stable. No symptoms.   Reports he has been on relafen for years for arthritis. Tried tonic water and felt it helped his arthritis.   Past Medical History:  Diagnosis Date   Aortoiliac occlusive disease ([place])    Carotid artery disease ([place])    Complication of anesthesia 1955   sodium pentathol ?, N/V   Contracture of palmar fascia 06/21/2010   Dysrhythmia    PAF   Enthesopathy of hip region 11/27/2009   Essential hypertension, benign 06/21/2010   GERD (gastroesophageal reflux disease)    occ   Head injury with loss of consciousness ([place]) 2001   short period    Heart murmur    Hyperlipidemia LDL goal <100 06/21/2010   Hypertrophy of prostate without urinary obstruction and other lower urinary tract symptoms (LUTS) 06/21/2010   Hypothyroidism 08/27/2011   Malignant neoplasm of prostate ([place]) 06/21/2010   Mitral regurgitation 10/07/2015   Severe MR noted on ECHO with partially flail post leaflet Oct 03 2015    Osteoarthrosis, unspecified whether generalized or localized, unspecified site 06/21/2010   Other and unspecified hyperlipidemia 06/21/2010   Pain in joint, lower leg 02/25/2012   Palpitations 08/14/2010   Patent foramen ovale 01/04/2016   Closed at the time of mitral valve repair    PONV (postoperative nausea and vomiting)    S/P minimally invasive mitral valve repair 01/04/2016   Complex valvuloplasty including triangular resection of flail segment of posterior leaflet, artificial Gore-tex neochord placement x6 and 34mm Sorin Memo 3D ring annuloplasty via right mini thoracotomy approach with closure of PFO and clipping of LA appendage    Unspecified constipation 06/21/2010   Unspecified essential hypertension 06/21/2010   Unspecified hypothyroidism 08/27/2011   Urinary frequency 08/14/2008    Past Surgical History:  Procedure Laterality Date   CARDIAC CATHETERIZATION N/A 11/10/2015   Procedure: Right/Left Heart Cath and Coronary Angiography;  Surgeon: Peter M Martinique, MD;  Location: [place];  Service: Cardiovascular;  Laterality: N/A;   CATARACT EXTRACTION W/ INTRAOCULAR LENS  IMPLANT, BILATERAL  2006   CLIPPING OF ATRIAL APPENDAGE N/A 01/04/2016   Procedure: CLIPPING OF ATRIAL APPENDAGE;  Surgeon: Rexene Alberts, MD;  Location: [facility];  Service: Open Heart Surgery;  Laterality: N/A;   COLONOSCOPY     INGUINAL HERNIA REPAIR Left 12/19/2020   Procedure: LEFT OPEN INGUINAL HERNIA REPAIR WITHOUT MESH;  Surgeon: Kinsinger, Arta Bruce, MD;  Location: [place];  Service: General;  Laterality: Left;  ROOM 3 STARTING AT 11:30AM  FOR 60 MIN   KNEE ARTHROSCOPY WITH MENISCAL REPAIR Left 1968   MITRAL VALVE REPAIR Right 01/04/2016   Procedure: MINIMALLY INVASIVE MITRAL VALVE REPAIR (MVR);  Surgeon: Rexene Alberts, MD;  Location: [facility];  Service: Open Heart Surgery;  Laterality: Right;   Open knee- ligament repair Right 1955   Dr. Ethel Rana   PROSTATE SURGERY  2000   seed implants   REVERSE SHOULDER ARTHROPLASTY Right 08/12/2020   Procedure: REVERSE SHOULDER ARTHROPLASTY;  Surgeon: Justice Britain, MD;  Location: [facility];  Service: Orthopedics;  Laterality: Right;  177min   TEE WITHOUT CARDIOVERSION N/A 11/10/2015   Procedure: TRANSESOPHAGEAL ECHOCARDIOGRAM (TEE);  Surgeon: Skeet Latch, MD;  Location: [facility];  Service: Cardiovascular;  Laterality: N/A;   TEE WITHOUT CARDIOVERSION N/A 01/04/2016   Procedure: TRANSESOPHAGEAL ECHOCARDIOGRAM (TEE);  Surgeon: Rexene Alberts, MD;  Location: [facility];  Service: Open Heart Surgery;  Laterality: N/A;    No Known Allergies  Outpatient Encounter Medications as of 10/30/2021  Medication Sig   alfuzosin (UROXATRAL) 10 MG 24 hr tablet Take 10 mg by mouth daily with breakfast.   amoxicillin (AMOXIL) 500 MG tablet Take 4 tablets 1 hour prior to dental procedures.   aspirin 81 MG tablet Take 81 mg by mouth every other day. for anticoagulation   COVID-19 mRNA bivalent vaccine, Moderna, (MODERNA COVID-19 BIVAL BOOSTER) 50 MCG/0.5ML injection Inject into the muscle.   COVID-19 mRNA vaccine, Moderna, 100 MCG/0.5ML injection Inject into the muscle.   Levothyroxine Sodium (TIROSINT) 50 MCG CAPS TAKE 1 CAPSULE DAILY BEFORE BREAKFAST   losartan (COZAAR) 25 MG tablet Take 0.5 tablets (12.5 mg total) by mouth daily.   metoprolol succinate (TOPROL-XL) 25 MG 24 hr tablet Take 12.5 mg by mouth daily.   nabumetone (RELAFEN) 750 MG tablet Take 1 tablet (750 mg total) by mouth 2 (two) times daily.   nitroGLYCERIN (NITROSTAT) 0.4 MG SL tablet Place 1 tablet (0.4 mg total) under the tongue every 5  (five) minutes as needed for chest pain.   simvastatin (ZOCOR) 40 MG tablet Take 0.5 tablets (20 mg total) by mouth daily.   torsemide (DEMADEX) 20 MG tablet Take 0.5 tablets (10 mg total) by mouth 3 (three) times a week. Take it as needed for LE swelling   VESICARE 5 MG tablet Take 5 mg by mouth daily.   [DISCONTINUED] tamsulosin (FLOMAX) 0.4 MG CAPS capsule Take 0.4 mg by mouth daily.   No facility-administered encounter medications on file as of 10/30/2021.    Review of Systems:  Review of Systems  Constitutional:  Negative for activity change, appetite change, chills, diaphoresis, fatigue, fever and unexpected weight change.  Respiratory:  Negative for cough, shortness of breath, wheezing and stridor.   Cardiovascular:  Negative for chest pain, palpitations and leg swelling.  Gastrointestinal:  Negative for abdominal distention, abdominal pain, constipation and diarrhea.  Genitourinary:  Negative for difficulty urinating and dysuria.  Musculoskeletal:  Positive for arthralgias. Negative for back pain, gait problem, joint swelling and myalgias.  Neurological:  Negative for dizziness, seizures, syncope, facial asymmetry, speech difficulty, weakness and headaches.  Hematological:  Negative for adenopathy. Does not bruise/bleed easily.  Psychiatric/Behavioral:  Negative for agitation, behavioral problems and confusion.    Health Maintenance  Topic Date Due   TETANUS/TDAP  02/27/2025   Pneumonia Vaccine 7+ Years old  Completed   INFLUENZA VACCINE  Completed   COVID-19 Vaccine  Completed   Zoster Vaccines- Shingrix  Completed   HPV VACCINES  Aged Out    Physical Exam: Vitals:   10/30/21 1400  BP: 130/74  Pulse: 78  Temp: 97.7 F (36.5 C)  SpO2: 97%  Weight: 159 lb (72.1 kg)  Height: 5\' 7"  (1.702 m)   Body mass index is 24.9 kg/m. Physical Exam Vitals reviewed.  Constitutional:      General: He is not in acute distress.    Appearance: He is not diaphoretic.  HENT:      Head: Normocephalic and atraumatic.     Nose: Nose normal.     Mouth/Throat:     Mouth: Mucous membranes are moist.     Pharynx: Oropharynx is clear.  Eyes:     Conjunctiva/sclera: Conjunctivae normal.     Pupils: Pupils are equal, round, and reactive to light.  Neck:     Thyroid: No thyromegaly.     Vascular: No carotid bruit or JVD.     Trachea: No tracheal deviation.  Cardiovascular:     Rate and Rhythm: Normal rate and regular rhythm.     Heart sounds: No murmur heard. Pulmonary:     Effort: Pulmonary effort is normal. No respiratory distress.     Breath sounds: Normal breath sounds. No wheezing.  Abdominal:     General: Bowel sounds are normal. There is no distension.     Palpations: Abdomen is soft.     Tenderness: There is no abdominal tenderness.  Musculoskeletal:        General: No swelling, tenderness, deformity or signs of injury.     Cervical back: No rigidity or tenderness.     Right lower leg: No edema.     Left lower leg: No edema (trace to the left ankle.).  Lymphadenopathy:     Cervical: No cervical adenopathy.  Skin:    General: Skin is warm and dry.  Neurological:     Mental Status: He is alert and oriented to person, place, and time.     Cranial Nerves: No cranial nerve deficit.  Psychiatric:        Mood and Affect: Mood normal.    Labs reviewed: Basic Metabolic Panel: Recent Labs    11/24/20 0000 12/19/20 1016 07/25/21 0000 08/31/21 0000 09/01/21 0000 10/24/21 0000  NA 140 141 141 139  --  141  K 4.7 3.8 4.6 4.3  --  4.6  CL 104 103 105 102  --  101  CO2 24*  --  26* 26*  --  24*  GLUCOSE  --  100*  --   --   --   --   BUN 26* 26* 23* 27*  --  28*  CREATININE 1.4* 1.30* 1.4* 1.4*  --  1.5*  CALCIUM 8.8  --  9.0  --  9.4 9.1  TSH 4.46  --  5.13  --   --  5.60   Liver Function Tests: Recent Labs    11/24/20 0000 07/25/21 0000 10/24/21 0000  AST 34 36 27  ALT 27 25 23   ALKPHOS 113 96 97  ALBUMIN 4.0  4.1 3.9   No results for  input(s): LIPASE, AMYLASE in the last 8760 hours. No results for input(s): AMMONIA in the last 8760 hours. CBC: Recent Labs    11/24/20 0000 12/19/20 1016 10/24/21 0000  WBC 3.9  --  4.7  HGB 12.7* 11.6* 12.4*  HCT 37* 34.0* 37*  PLT 223  --  263   Lipid Panel: Recent Labs    11/24/20 0000 07/25/21 0000 10/24/21 0000  CHOL 156 161 185  HDL 68 68 63  LDLCALC 73 78 100  TRIG 77 74 106   Lab Results  Component Value Date   HGBA1C 5.5 01/02/2016    Procedures since last visit: No results found.  Assessment/Plan  1. Essential hypertension, benign Controlled  Continue losartan and Toprol  2. Leg edema Improved with Torsemide 10 mg three times a week   3. Hyperlipidemia LDL goal <100 Lab Results  Component Value Date   LDLCALC 100 10/24/2021   Continue Zocor   4. Hypothyroidism, unspecified type Lab Results  Component Value Date   TSH 5.60 10/24/2021   Continue Synthroid Check TSH in 6 months due to borderline elevation  5. Paroxysmal atrial fibrillation (HCC) Hx of this but has been in sinus. On baby asa and followed by cardiology  6. Primary osteoarthritis involving multiple joints Continues on Relafen Needs BMP q 6 months   7. Stage 3b chronic kidney disease (HCC) Cr with slight rise on torsemide and relafen Continue to periodically monitor BMP and avoid nephrotoxic agents    Labs/tests ordered:  * No order type specified * CBC BMP TSH Next appt:  6 months    Total time 59min:  time greater than 50% of total time spent doing pt counseling and coordination of care

## 2021-11-01 ENCOUNTER — Other Ambulatory Visit: Payer: Self-pay | Admitting: Internal Medicine

## 2021-11-01 MED ORDER — DUPILUMAB 300 MG/2ML ~~LOC~~ SOSY: 300.0000 mg | PREFILLED_SYRINGE | SUBCUTANEOUS | 0 refills | Status: DC

## 2021-11-01 NOTE — Progress Notes (Signed)
Patient walked in the clinic and needed Refill on his [place]. He has been on it for many years for Rash Atopic dermatitis in his back. He has not seen his  Dermatologist since 10/21 so he refused to refill it. I did do refill for 3 months. I have told him to talk to dermatology to make sure if he need to continue taking it. He agreed with the plan

## 2021-11-06 ENCOUNTER — Telehealth: Payer: Self-pay | Admitting: *Deleted

## 2021-11-06 NOTE — Telephone Encounter (Signed)
Received fax from [facility] confirming patient's [place].   Verifying if you want the pens that patient is currently taking or Switching him to syringes.   Patient stated that he is currently using Prefilled pens.   Placed fax in Dr. Steve Rattler box to review,fill out and sign.   To be faxed back to [facility] Fax:[phone] once completed.

## 2021-11-07 NOTE — Telephone Encounter (Signed)
Rodena Piety can you have Samuel [name] sign this order for me as he is almost running out of it and they will take time to get the supply

## 2021-11-07 NOTE — Telephone Encounter (Signed)
Placed order in Dinah's folder and message forwarded.

## 2021-11-08 NOTE — Telephone Encounter (Signed)
Dupixent order signed gave to Rodena Piety to fax.

## 2021-11-27 DIAGNOSIS — M1712 Unilateral primary osteoarthritis, left knee: Secondary | ICD-10-CM | POA: Diagnosis not present

## 2021-12-06 DIAGNOSIS — L570 Actinic keratosis: Secondary | ICD-10-CM | POA: Diagnosis not present

## 2021-12-06 DIAGNOSIS — L814 Other melanin hyperpigmentation: Secondary | ICD-10-CM | POA: Diagnosis not present

## 2021-12-06 DIAGNOSIS — D485 Neoplasm of uncertain behavior of skin: Secondary | ICD-10-CM | POA: Diagnosis not present

## 2021-12-11 DIAGNOSIS — C44319 Basal cell carcinoma of skin of other parts of face: Secondary | ICD-10-CM | POA: Diagnosis not present

## 2022-01-03 DIAGNOSIS — Z85068 Personal history of other malignant neoplasm of small intestine: Secondary | ICD-10-CM | POA: Diagnosis not present

## 2022-01-03 DIAGNOSIS — L579 Skin changes due to chronic exposure to nonionizing radiation, unspecified: Secondary | ICD-10-CM | POA: Diagnosis not present

## 2022-01-03 DIAGNOSIS — D1801 Hemangioma of skin and subcutaneous tissue: Secondary | ICD-10-CM | POA: Diagnosis not present

## 2022-01-03 DIAGNOSIS — L814 Other melanin hyperpigmentation: Secondary | ICD-10-CM | POA: Diagnosis not present

## 2022-01-03 DIAGNOSIS — C44319 Basal cell carcinoma of skin of other parts of face: Secondary | ICD-10-CM | POA: Diagnosis not present

## 2022-01-08 ENCOUNTER — Other Ambulatory Visit: Payer: Self-pay | Admitting: Internal Medicine

## 2022-01-16 DIAGNOSIS — C44319 Basal cell carcinoma of skin of other parts of face: Secondary | ICD-10-CM | POA: Diagnosis not present

## 2022-02-19 ENCOUNTER — Other Ambulatory Visit: Payer: Self-pay | Admitting: Internal Medicine

## 2022-02-19 DIAGNOSIS — Z08 Encounter for follow-up examination after completed treatment for malignant neoplasm: Secondary | ICD-10-CM | POA: Diagnosis not present

## 2022-02-19 DIAGNOSIS — C4492 Squamous cell carcinoma of skin, unspecified: Secondary | ICD-10-CM | POA: Diagnosis not present

## 2022-02-19 DIAGNOSIS — C4491 Basal cell carcinoma of skin, unspecified: Secondary | ICD-10-CM | POA: Diagnosis not present

## 2022-02-19 DIAGNOSIS — L905 Scar conditions and fibrosis of skin: Secondary | ICD-10-CM | POA: Diagnosis not present

## 2022-02-19 DIAGNOSIS — L923 Foreign body granuloma of the skin and subcutaneous tissue: Secondary | ICD-10-CM | POA: Diagnosis not present

## 2022-02-19 DIAGNOSIS — Z85828 Personal history of other malignant neoplasm of skin: Secondary | ICD-10-CM | POA: Diagnosis not present

## 2022-02-21 ENCOUNTER — Non-Acute Institutional Stay: Payer: Medicare Other | Admitting: Internal Medicine

## 2022-02-21 ENCOUNTER — Encounter: Payer: Self-pay | Admitting: Internal Medicine

## 2022-02-21 VITALS — BP 122/70 | HR 68 | Temp 97.9°F | Ht 67.0 in | Wt 160.0 lb

## 2022-02-21 DIAGNOSIS — E785 Hyperlipidemia, unspecified: Secondary | ICD-10-CM

## 2022-02-21 DIAGNOSIS — M130 Polyarthritis, unspecified: Secondary | ICD-10-CM

## 2022-02-21 DIAGNOSIS — I480 Paroxysmal atrial fibrillation: Secondary | ICD-10-CM | POA: Diagnosis not present

## 2022-02-21 DIAGNOSIS — R600 Localized edema: Secondary | ICD-10-CM

## 2022-02-21 DIAGNOSIS — N1831 Chronic kidney disease, stage 3a: Secondary | ICD-10-CM

## 2022-02-21 DIAGNOSIS — W57XXXA Bitten or stung by nonvenomous insect and other nonvenomous arthropods, initial encounter: Secondary | ICD-10-CM | POA: Diagnosis not present

## 2022-02-21 MED ORDER — TRIAMCINOLONE ACETONIDE 0.1 % EX CREA: 1.0000 "application " | TOPICAL_CREAM | Freq: Two times a day (BID) | CUTANEOUS | 0 refills | Status: DC

## 2022-02-21 NOTE — Progress Notes (Signed)
? ?Location: [facility] ?  ?Place of Service:  Clinic (12) ? ?Provider:  ? ?Code Status:  ?Goals of Care:  ? ?  10/30/2021  ?  4:23 PM  ?Advanced Directives  ?Does Patient Have a Medical Advance Directive? Yes  ? ? ? ?Chief Complaint  ?Patient presents with  ? Medical Management of Chronic Issues  ?  Patient returns to the clinic for follow up. He would like to discuss his swollen ankles and torsemide dose.   ? ? ?HPI: Patient is a 88 y.o. male seen today for an acute visit for Swelling in his Legs ? ? ? He has h/o Arthritis Takes Relafen for past 8-9 years. Tapering has not helped ?LE edema  ?BPH with Frequency  ?Recent Inguinal Hernia repair  ?H/o Right Shoulder Arthroplasty ?MVR repair ?CKD Stage 3 A ? ?He was on PRN Torsemide but recently worsening of Edema again ?Left more then right ?No Chest pain or SOB ?Last Echo in 2018 EF 55 %  ? ?Past Medical History:  ?Diagnosis Date  ? Aortoiliac occlusive disease ([place])   ? Carotid artery disease ([place])   ? Complication of anesthesia 1955  ? sodium pentathol ?, N/V  ? Contracture of palmar fascia 06/21/2010  ? Dysrhythmia   ? PAF  ? Enthesopathy of hip region 11/27/2009  ? Essential hypertension, benign 06/21/2010  ? GERD (gastroesophageal reflux disease)   ? occ  ? Head injury with loss of consciousness ([place]) 2001  ? short period  ? Heart murmur   ? Hyperlipidemia LDL goal <100 06/21/2010  ? Hypertrophy of prostate without urinary obstruction and other lower urinary tract symptoms (LUTS) 06/21/2010  ? Hypothyroidism 08/27/2011  ? Malignant neoplasm of prostate ([place]) 06/21/2010  ? Mitral regurgitation 10/07/2015  ? Severe MR noted on ECHO with partially flail post leaflet Oct 03 2015   ? Osteoarthrosis, unspecified whether generalized or localized, unspecified site 06/21/2010  ? Other and unspecified hyperlipidemia 06/21/2010  ? Pain in joint, lower leg 02/25/2012  ? Palpitations 08/14/2010  ? Patent foramen ovale 01/04/2016  ? Closed at the time of mitral valve  repair   ? PONV (postoperative nausea and vomiting)   ? S/P minimally invasive mitral valve repair 01/04/2016  ? Complex valvuloplasty including triangular resection of flail segment of posterior leaflet, artificial Gore-tex neochord placement x6 and 60m Sorin Memo 3D ring annuloplasty via right mini thoracotomy approach with closure of PFO and clipping of LA appendage   ? Unspecified constipation 06/21/2010  ? Unspecified essential hypertension 06/21/2010  ? Unspecified hypothyroidism 08/27/2011  ? Urinary frequency 08/14/2008  ? ? ?Past Surgical History:  ?Procedure Laterality Date  ? CARDIAC CATHETERIZATION N/A 11/10/2015  ? Procedure: Right/Left Heart Cath and Coronary Angiography;  Surgeon: Peter M JMartinique MD;  [facility];  Service: Cardiovascular;  Laterality: N/A;  ? CATARACT EXTRACTION W/ INTRAOCULAR LENS  IMPLANT, BILATERAL  2006  ? CLIPPING OF ATRIAL APPENDAGE N/A 01/04/2016  ? Procedure: CLIPPING OF ATRIAL APPENDAGE;  Surgeon: CRexene Alberts MD;  [facility]: [facility]  Service: Open Heart Surgery;  Laterality: N/A;  ? COLONOSCOPY    ? INGUINAL HERNIA REPAIR Left 12/19/2020  ? Procedure: LEFT OPEN INGUINAL HERNIA REPAIR WITHOUT MESH;  Surgeon: Kinsinger, LArta Bruce MD;  [place] [facility]  Service: General;  Laterality: Left;  ROOM 3 STARTING AT 11:30AM FOR 60 MIN  ? KNEE ARTHROSCOPY WITH MENISCAL REPAIR Left 1968  ? MITRAL VALVE REPAIR Right 01/04/2016  ?  Procedure: MINIMALLY INVASIVE MITRAL VALVE REPAIR (MVR);  Surgeon: Rexene Alberts, MD;  Location: [facility];  Service: Open Heart Surgery;  Laterality: Right;  ? Open knee- ligament repair Right 1955  ? Dr. Ethel Rana  ? PROSTATE SURGERY  2000  ? seed implants  ? REVERSE SHOULDER ARTHROPLASTY Right 08/12/2020  ? Procedure: REVERSE SHOULDER ARTHROPLASTY;  Surgeon: Justice Britain, MD;  Location: [facility];  Service: Orthopedics;  Laterality: Right;  122mn  ? TEE WITHOUT CARDIOVERSION N/A 11/10/2015  ? Procedure: TRANSESOPHAGEAL  ECHOCARDIOGRAM (TEE);  Surgeon: TSkeet Latch MD;  [place] [facility]  Service: Cardiovascular;  Laterality: N/A;  ? TEE WITHOUT CARDIOVERSION N/A 01/04/2016  ? Procedure: TRANSESOPHAGEAL ECHOCARDIOGRAM (TEE);  Surgeon: CRexene Alberts MD;  [place] [facility]  Service: Open Heart Surgery;  Laterality: N/A;  ? ? ?No Known Allergies ? ?Outpatient Encounter Medications as of 02/21/2022  ?Medication Sig  ? alfuzosin (UROXATRAL) 10 MG 24 hr tablet Take 10 mg by mouth daily with breakfast.  ? amoxicillin (AMOXIL) 500 MG tablet Take 4 tablets 1 hour prior to dental procedures.  ? aspirin 81 MG tablet Take 81 mg by mouth every other day. for anticoagulation  ? Levothyroxine Sodium (TIROSINT) 50 MCG CAPS TAKE 1 CAPSULE DAILY BEFORE BREAKFAST  ? losartan (COZAAR) 25 MG tablet Take 0.5 tablets (12.5 mg total) by mouth daily.  ? nabumetone (RELAFEN) 750 MG tablet TAKE 1 TABLET TWICE A DAY  ? nitroGLYCERIN (NITROSTAT) 0.4 MG SL tablet Place 1 tablet (0.4 mg total) under the tongue every 5 (five) minutes as needed for chest pain.  ? simvastatin (ZOCOR) 40 MG tablet Take 0.5 tablets (20 mg total) by mouth daily.  ? torsemide (DEMADEX) 20 MG tablet Take 0.5 tablets (10 mg total) by mouth 3 (three) times a week. Take it as needed for LE swelling (Patient taking differently: Take 20 mg by mouth 3 (three) times a week.)  ? triamcinolone cream (KENALOG) 0.1 % Apply 1 application. topically 2 (two) times daily.  ? VESICARE 5 MG tablet Take 5 mg by mouth daily.  ? [DISCONTINUED] metoprolol succinate (TOPROL-XL) 25 MG 24 hr tablet Take 12.5 mg by mouth daily.  ? COVID-19 mRNA bivalent vaccine, Moderna, (MODERNA COVID-19 BIVAL BOOSTER) 50 MCG/0.5ML injection Inject into the muscle.  ? COVID-19 mRNA vaccine, Moderna, 100 MCG/0.5ML injection Inject into the muscle.  ? ?No facility-administered encounter medications on file as of 02/21/2022.  ? ? ?Review of Systems:  ?Review of Systems  ?Constitutional:  Negative for activity change,  appetite change and unexpected weight change.  ?HENT: Negative.    ?Respiratory:  Negative for cough and shortness of breath.   ?Cardiovascular:  Positive for leg swelling.  ?Gastrointestinal:  Negative for constipation.  ?Genitourinary:  Negative for frequency.  ?Musculoskeletal:  Negative for arthralgias, gait problem and myalgias.  ?Skin: Negative.  Negative for rash.  ?Neurological:  Negative for dizziness and weakness.  ?Psychiatric/Behavioral:  Negative for confusion and sleep disturbance.   ?All other systems reviewed and are negative. ? ?Health Maintenance  ?Topic Date Due  ? INFLUENZA VACCINE  05/22/2022  ? TETANUS/TDAP  02/27/2025  ? Pneumonia Vaccine 88 Years old  Completed  ? COVID-19 Vaccine  Completed  ? Zoster Vaccines- Shingrix  Completed  ? HPV VACCINES  Aged Out  ? ? ?Physical Exam: ?Vitals:  ? 02/21/22 0820  ?BP: 122/70  ?Pulse: 68  ?Temp: 97.9 ?F (36.6 ?C)  ?SpO2: 94%  ?Weight: 160 lb (72.6 kg)  ?Height: '5\' 7"'$  (1.702 m)  ? ?  Body mass index is 25.06 kg/m?Marland Kitchen ?Physical Exam ?Vitals reviewed.  ?Constitutional:   ?   Appearance: Normal appearance.  ?HENT:  ?   Head: Normocephalic.  ?   Nose: Nose normal.  ?   Mouth/Throat:  ?   Mouth: Mucous membranes are moist.  ?   Pharynx: Oropharynx is clear.  ?Eyes:  ?   Pupils: Pupils are equal, round, and reactive to light.  ?Cardiovascular:  ?   Rate and Rhythm: Normal rate and regular rhythm.  ?   Pulses: Normal pulses.  ?   Heart sounds: Murmur heard.  ?Pulmonary:  ?   Effort: Pulmonary effort is normal. No respiratory distress.  ?   Breath sounds: Normal breath sounds. No rales.  ?Abdominal:  ?   General: Abdomen is flat. Bowel sounds are normal.  ?   Palpations: Abdomen is soft.  ?Musculoskeletal:     ?   General: Swelling present.  ?   Cervical back: Neck supple.  ?   Comments: Moderate swelling Bilateral Left more then right ?Had 2 spots on back of his left leg which were red and seem like bug bite ?Did not seem infected  ?Skin: ?   General: Skin is  warm.  ?Neurological:  ?   General: No focal deficit present.  ?   Mental Status: He is alert and oriented to person, place, and time.  ?Psychiatric:     ?   Mood and Affect: Mood normal.     ?   Thought Content: Thought c

## 2022-02-21 NOTE — Patient Instructions (Signed)
Triamcinolone Ointment / Cream 2 times a day on the [place] area ? ?

## 2022-02-23 DIAGNOSIS — M1712 Unilateral primary osteoarthritis, left knee: Secondary | ICD-10-CM | POA: Diagnosis not present

## 2022-02-23 NOTE — Addendum Note (Signed)
Addended by: Georgina Snell on: 02/23/2022 06:39 PM ? ? Modules accepted: Level of Service ? ?

## 2022-02-28 ENCOUNTER — Other Ambulatory Visit: Payer: Self-pay

## 2022-03-06 DIAGNOSIS — I10 Essential (primary) hypertension: Secondary | ICD-10-CM | POA: Diagnosis not present

## 2022-03-06 DIAGNOSIS — E785 Hyperlipidemia, unspecified: Secondary | ICD-10-CM | POA: Diagnosis not present

## 2022-03-06 LAB — TSH: TSH: 4.45 (ref 0.41–5.90)

## 2022-03-06 LAB — LIPID PANEL
Cholesterol: 160 (ref 0–200)
HDL: 59 (ref 35–70)
LDL Cholesterol: 88
LDl/HDL Ratio: 2.7
Triglycerides: 70 (ref 40–160)

## 2022-03-06 LAB — BASIC METABOLIC PANEL
BUN: 27 — AB (ref 4–21)
CO2: 23 — AB (ref 13–22)
Chloride: 105 (ref 99–108)
Creatinine: 1.6 — AB (ref 0.6–1.3)
Glucose: 86
Potassium: 4.5 mEq/L (ref 3.5–5.1)
Sodium: 142 (ref 137–147)

## 2022-03-06 LAB — COMPREHENSIVE METABOLIC PANEL: Calcium: 8.7 (ref 8.7–10.7)

## 2022-03-13 ENCOUNTER — Encounter: Payer: Self-pay | Admitting: Internal Medicine

## 2022-03-14 ENCOUNTER — Non-Acute Institutional Stay: Payer: Medicare Other | Admitting: Internal Medicine

## 2022-03-14 VITALS — BP 128/84 | HR 78 | Temp 97.7°F | Ht 67.0 in | Wt 159.6 lb

## 2022-03-14 DIAGNOSIS — I10 Essential (primary) hypertension: Secondary | ICD-10-CM

## 2022-03-14 DIAGNOSIS — E039 Hypothyroidism, unspecified: Secondary | ICD-10-CM | POA: Diagnosis not present

## 2022-03-14 DIAGNOSIS — M130 Polyarthritis, unspecified: Secondary | ICD-10-CM

## 2022-03-14 DIAGNOSIS — R600 Localized edema: Secondary | ICD-10-CM | POA: Diagnosis not present

## 2022-03-14 DIAGNOSIS — W57XXXS Bitten or stung by nonvenomous insect and other nonvenomous arthropods, sequela: Secondary | ICD-10-CM | POA: Diagnosis not present

## 2022-03-14 DIAGNOSIS — M1712 Unilateral primary osteoarthritis, left knee: Secondary | ICD-10-CM

## 2022-03-14 DIAGNOSIS — I480 Paroxysmal atrial fibrillation: Secondary | ICD-10-CM

## 2022-03-14 DIAGNOSIS — N1831 Chronic kidney disease, stage 3a: Secondary | ICD-10-CM

## 2022-03-14 DIAGNOSIS — E785 Hyperlipidemia, unspecified: Secondary | ICD-10-CM

## 2022-03-14 NOTE — Progress Notes (Unsigned)
Location: [place] of Service:  Clinic (12)  Provider:   Code Status: *** Goals of Care:     02/21/2022    9:08 AM  Advanced Directives  Does Patient Have a Medical Advance Directive? Yes  Type of Advance Directive Living will  Does patient want to make changes to medical advance directive? No - Patient declined     Chief Complaint  Patient presents with  . Medical Management of Chronic Issues    Patient returns to the clinic for his 3 week follow up. YW    HPI: Patient is a 88 y.o. male seen today for an acute visit for Follow up of his LE edema   He has h/o Arthritis Takes Relafen for past 8-9 years. Tapering has not helped LE edema  BPH with Frequency  Recent Inguinal Hernia repair  H/o Right Shoulder Arthroplasty MVR repair CKD Stage 3 A  Past Medical History:  Diagnosis Date  . Aortoiliac occlusive disease ([place])   . Carotid artery disease ([place])   . Complication of anesthesia 1955   sodium pentathol ?, N/V  . Contracture of palmar fascia 06/21/2010  . Dysrhythmia    PAF  . Enthesopathy of hip region 11/27/2009  . Essential hypertension, benign 06/21/2010  . GERD (gastroesophageal reflux disease)    occ  . Head injury with loss of consciousness ([place]) 2001   short period  . Heart murmur   . Hyperlipidemia LDL goal <100 06/21/2010  . Hypertrophy of prostate without urinary obstruction and other lower urinary tract symptoms (LUTS) 06/21/2010  . Hypothyroidism 08/27/2011  . Malignant neoplasm of prostate ([place]) 06/21/2010  . Mitral regurgitation 10/07/2015   Severe MR noted on ECHO with partially flail post leaflet Oct 03 2015   . Osteoarthrosis, unspecified whether generalized or localized, unspecified site 06/21/2010  . Other and unspecified hyperlipidemia 06/21/2010  . Pain in joint, lower leg 02/25/2012  . Palpitations 08/14/2010  . Patent foramen ovale 01/04/2016   Closed at the time of mitral valve repair   . PONV (postoperative  nausea and vomiting)   . S/P minimally invasive mitral valve repair 01/04/2016   Complex valvuloplasty including triangular resection of flail segment of posterior leaflet, artificial Gore-tex neochord placement x6 and 78m Sorin Memo 3D ring annuloplasty via right mini thoracotomy approach with closure of PFO and clipping of LA appendage   . Unspecified constipation 06/21/2010  . Unspecified essential hypertension 06/21/2010  . Unspecified hypothyroidism 08/27/2011  . Urinary frequency 08/14/2008    Past Surgical History:  Procedure Laterality Date  . CARDIAC CATHETERIZATION N/A 11/10/2015   Procedure: Right/Left Heart Cath and Coronary Angiography;  Surgeon: Peter M JMartinique MD;  [facility];  Service: Cardiovascular;  Laterality: N/A;  . CATARACT EXTRACTION W/ INTRAOCULAR LENS  IMPLANT, BILATERAL  2006  . CLIPPING OF ATRIAL APPENDAGE N/A 01/04/2016   Procedure: CLIPPING OF ATRIAL APPENDAGE;  Surgeon: CRexene Alberts MD;  [facility]  Service: Open Heart Surgery;  Laterality: N/A;  . COLONOSCOPY    . INGUINAL HERNIA REPAIR Left 12/19/2020   Procedure: LEFT OPEN INGUINAL HERNIA REPAIR WITHOUT MESH;  Surgeon: Kinsinger, LArta Bruce MD;  [facility]  Service: General;  Laterality: Left;  ROOM 3 STARTING AT 11:30AM FOR 60 MIN  . KNEE ARTHROSCOPY WITH MENISCAL REPAIR Left 1968  . MITRAL VALVE REPAIR Right 01/04/2016   Procedure: MINIMALLY INVASIVE MITRAL VALVE REPAIR (MVR);  Surgeon: CRexene Alberts MD;  [facility];  Service: Open Heart Surgery;  Laterality: Right;  . Open knee- ligament repair Right 1955   Dr. Ethel Rana  . PROSTATE SURGERY  2000   seed implants  . REVERSE SHOULDER ARTHROPLASTY Right 08/12/2020   Procedure: REVERSE SHOULDER ARTHROPLASTY;  Surgeon: Justice Britain, MD;  Location: [facility] [place];  Service: Orthopedics;  Laterality: Right;  181mn  . TEE WITHOUT CARDIOVERSION N/A 11/10/2015   Procedure: TRANSESOPHAGEAL ECHOCARDIOGRAM (TEE);   Surgeon: TSkeet Latch MD;  [facility]  Service: Cardiovascular;  Laterality: N/A;  . TEE WITHOUT CARDIOVERSION N/A 01/04/2016   Procedure: TRANSESOPHAGEAL ECHOCARDIOGRAM (TEE);  Surgeon: CRexene Alberts MD;  [facility]  Service: Open Heart Surgery;  Laterality: N/A;    No Known Allergies  Outpatient Encounter Medications as of 03/14/2022  Medication Sig  . alfuzosin (UROXATRAL) 10 MG 24 hr tablet Take 10 mg by mouth daily with breakfast.  . amoxicillin (AMOXIL) 500 MG tablet Take 4 tablets 1 hour prior to dental procedures.  .Marland Kitchenaspirin 81 MG tablet Take 81 mg by mouth every other day. for anticoagulation  . Levothyroxine Sodium (TIROSINT) 50 MCG CAPS TAKE 1 CAPSULE DAILY BEFORE BREAKFAST  . losartan (COZAAR) 25 MG tablet Take 0.5 tablets (12.5 mg total) by mouth daily.  . nabumetone (RELAFEN) 750 MG tablet TAKE 1 TABLET TWICE A DAY  . nitroGLYCERIN (NITROSTAT) 0.4 MG SL tablet Place 1 tablet (0.4 mg total) under the tongue every 5 (five) minutes as needed for chest pain.  . simvastatin (ZOCOR) 40 MG tablet Take 0.5 tablets (20 mg total) by mouth daily.  .Marland Kitchentorsemide (DEMADEX) 20 MG tablet Take 0.5 tablets (10 mg total) by mouth 3 (three) times a week. Take it as needed for LE swelling (Patient taking differently: Take 20 mg by mouth 3 (three) times a week.)  . triamcinolone cream (KENALOG) 0.1 % Apply 1 application. topically 2 (two) times daily.  . VESICARE 5 MG tablet Take 5 mg by mouth daily.  . [DISCONTINUED] COVID-19 mRNA bivalent vaccine, Moderna, (MODERNA COVID-19 BIVAL BOOSTER) 50 MCG/0.5ML injection Inject into the muscle.  . [DISCONTINUED] COVID-19 mRNA vaccine, Moderna, 100 MCG/0.5ML injection Inject into the muscle.   No facility-administered encounter medications on file as of 03/14/2022.    Review of Systems:  Review of Systems  Constitutional:  Negative for activity change, appetite change and unexpected weight change.  HENT: Negative.    Respiratory:   Negative for cough and shortness of breath.   Cardiovascular:  Positive for leg swelling.  Gastrointestinal:  Negative for constipation.  Genitourinary:  Positive for frequency.  Musculoskeletal:  Positive for arthralgias. Negative for gait problem and myalgias.  Skin: Negative.  Negative for rash.  Neurological:  Negative for dizziness and weakness.  Psychiatric/Behavioral:  Negative for confusion and sleep disturbance.   All other systems reviewed and are negative.  Health Maintenance  Topic Date Due  . INFLUENZA VACCINE  05/22/2022  . TETANUS/TDAP  02/27/2025  . Pneumonia Vaccine 88 Years old  Completed  . COVID-19 Vaccine  Completed  . Zoster Vaccines- Shingrix  Completed  . HPV VACCINES  Aged Out    Physical Exam: Vitals:   03/14/22 0843  BP: 128/84  Pulse: 78  Temp: 97.7 F (36.5 C)  SpO2: 97%  Weight: 159 lb 9.6 oz (72.4 kg)  Height: '5\' 7"'$  (1.702 m)   Body mass index is 25 kg/m. Physical Exam Vitals reviewed.  Constitutional:      Appearance: Normal appearance.  HENT:  Head: Normocephalic.     Nose: Nose normal.     Mouth/Throat:     Mouth: Mucous membranes are moist.     Pharynx: Oropharynx is clear.  Eyes:     Pupils: Pupils are equal, round, and reactive to light.  Cardiovascular:     Rate and Rhythm: Normal rate and regular rhythm.     Pulses: Normal pulses.     Heart sounds: Murmur heard.  Pulmonary:     Effort: Pulmonary effort is normal. No respiratory distress.     Breath sounds: Normal breath sounds. No rales.  Abdominal:     General: Abdomen is flat. Bowel sounds are normal.     Palpations: Abdomen is soft.  Musculoskeletal:        General: Swelling present.     Cervical back: Neck supple.     Comments: Leg swelling Left More then Right but better  Skin:    General: Skin is warm.  Neurological:     General: No focal deficit present.     Mental Status: He is alert and oriented to person, place, and time.  Psychiatric:        Mood  and Affect: Mood normal.        Thought Content: Thought content normal.    Labs reviewed: Basic Metabolic Panel: Recent Labs    07/25/21 0000 08/31/21 0000 09/01/21 0000 10/24/21 0000 03/06/22 0000  NA 141 139  --  141 142  K 4.6 4.3  --  4.6 4.5  CL 105 102  --  101 105  CO2 26* 26*  --  24* 23*  BUN 23* 27*  --  28* 27*  CREATININE 1.4* 1.4*  --  1.5* 1.6*  CALCIUM 9.0  --  9.4 9.1 8.7  TSH 5.13  --   --  5.60 4.45   Liver Function Tests: Recent Labs    07/25/21 0000 10/24/21 0000  AST 36 27  ALT 25 23  ALKPHOS 96 97  ALBUMIN 4.1 3.9   No results for input(s): LIPASE, AMYLASE in the last 8760 hours. No results for input(s): AMMONIA in the last 8760 hours. CBC: Recent Labs    10/24/21 0000  WBC 4.7  HGB 12.4*  HCT 37*  PLT 263   Lipid Panel: Recent Labs    07/25/21 0000 10/24/21 0000 03/06/22 0000  CHOL 161 185 160  HDL 68 63 59  LDLCALC 78 100 88  TRIG 74 106 70   Lab Results  Component Value Date   HGBA1C 5.5 01/02/2016    Procedures since last visit: No results found.  Assessment/Plan 1. Bilateral leg edema It has helped but still has some swelling in his legs Can incrase his toresimied to 4 times a week  2. Arthritis of left knee Plan to get Knee replacement Is going to see Ortho next week  3. Stage 3a chronic kidney disease (HCC) Creat statying stable  4. Bug bite, sequela resolved  5. Arthritis of multiple sites On Relafen Does nto want to taper it  6. PAF (paroxysmal atrial fibrillation) (HCC) On Aspirin Has been off Toprol per Dr Marlou Porch Will take it out if his list  7. Hyperlipidemia LDL goal <100 On statins LDL less then 100  8. Essential hypertension, benign Stable on cozaar  9. Hypothyroidism, unspecified type TSH midily high  Will repeat in few motnhs      Labs/tests ordered:  * No order type specified * Next appt:  05/02/2022

## 2022-03-14 NOTE — Patient Instructions (Signed)
Can increase Torsemide to 4/ week

## 2022-03-21 DIAGNOSIS — M25562 Pain in left knee: Secondary | ICD-10-CM | POA: Diagnosis not present

## 2022-03-21 DIAGNOSIS — M1712 Unilateral primary osteoarthritis, left knee: Secondary | ICD-10-CM | POA: Diagnosis not present

## 2022-03-30 ENCOUNTER — Telehealth: Payer: Self-pay | Admitting: Cardiology

## 2022-03-30 NOTE — Telephone Encounter (Signed)
Patient would like to confirm a clearance request has been received. He isn't sure when it was sent, but it should be from [facility], Dr. Netta Cedars for a knee replacement. Please advise.

## 2022-04-02 NOTE — Telephone Encounter (Signed)
Please contact patient and/or [facility] as we have not received a surgical clearance for this patient.

## 2022-04-03 NOTE — Telephone Encounter (Signed)
Spoke with [facility] and they are going to re-fax the clearance form over.

## 2022-04-04 ENCOUNTER — Telehealth: Payer: Self-pay | Admitting: *Deleted

## 2022-04-04 NOTE — Telephone Encounter (Signed)
I s/w the pt and he has been set up for a tele pre op appt 04/05/22 @ 11 am. Med rec and consent are done.

## 2022-04-04 NOTE — Telephone Encounter (Signed)
   Pre-operative Risk Assessment    Patient Name: Samuel [name]  DOB: [birth year] MRN: [id number]      Request for Surgical Clearance    Procedure:   Left total knee arthroplasty  Date of Surgery:  Clearance TBD                                 Surgeon:  Dr. Esmond Plants Surgeon's Group or Practice Name:  [facility] Phone number:  [phone] Adline Potter Fax number:  [phone]   Type of Clearance Requested:   - Medical  - Pharmacy:  Hold Aspirin     Type of Anesthesia:  Spinal   Additional requests/questions:    Signed, Rashel Okeefe   04/04/2022, 9:07 AM

## 2022-04-04 NOTE — Telephone Encounter (Signed)
Tried to reach the pt to set up a tele pre op appt, though no answer or vm came on.

## 2022-04-04 NOTE — Telephone Encounter (Signed)
I s/w the pt and he has been set up for a tele pre op appt 04/05/22 @ 11 am. Med rec and consent are done.      Patient Consent for Virtual Visit        Samuel [name] has provided verbal consent on 04/04/2022 for a virtual visit (video or telephone).   CONSENT FOR VIRTUAL VISIT FOR:  Samuel [name]  By participating in this virtual visit I agree to the following:  I hereby voluntarily request, consent and authorize [facility] and its employed or contracted physicians, physician assistants, nurse practitioners or other licensed health care professionals (the Practitioner), to provide me with telemedicine health care services (the "Services") as deemed necessary by the treating Practitioner. I acknowledge and consent to receive the Services by the Practitioner via telemedicine. I understand that the telemedicine visit will involve communicating with the Practitioner through live audiovisual communication technology and the disclosure of certain medical information by electronic transmission. I acknowledge that I have been given the opportunity to request an in-person assessment or other available alternative prior to the telemedicine visit and am voluntarily participating in the telemedicine visit.  I understand that I have the right to withhold or withdraw my consent to the use of telemedicine in the course of my care at any time, without affecting my right to future care or treatment, and that the Practitioner or I may terminate the telemedicine visit at any time. I understand that I have the right to inspect all information obtained and/or recorded in the course of the telemedicine visit and may receive copies of available information for a reasonable fee.  I understand that some of the potential risks of receiving the Services via telemedicine include:  Delay or interruption in medical evaluation due to technological equipment failure or disruption; Information transmitted may not be  sufficient (e.g. poor resolution of images) to allow for appropriate medical decision making by the Practitioner; and/or  In rare instances, security protocols could fail, causing a breach of personal health information.  Furthermore, I acknowledge that it is my responsibility to provide information about my medical history, conditions and care that is complete and accurate to the best of my ability. I acknowledge that Practitioner's advice, recommendations, and/or decision may be based on factors not within their control, such as incomplete or inaccurate data provided by me or distortions of diagnostic images or specimens that may result from electronic transmissions. I understand that the practice of medicine is not an exact science and that Practitioner makes no warranties or guarantees regarding treatment outcomes. I acknowledge that a copy of this consent can be made available to me via my patient portal ([facility]), or I can request a printed copy by calling the office of [facility].    I understand that my insurance will be billed for this visit.   I have read or had this consent read to me. I understand the contents of this consent, which adequately explains the benefits and risks of the Services being provided via telemedicine.  I have been provided ample opportunity to ask questions regarding this consent and the Services and have had my questions answered to my satisfaction. I give my informed consent for the services to be provided through the use of telemedicine in my medical care

## 2022-04-04 NOTE — Telephone Encounter (Signed)
Primary Cardiologist:Samuel Marlou Porch, MD  Chart reviewed as part of pre-operative protocol coverage. Because of [name] past medical history and time since last visit, he/she will require a virtual visit/telephone call in order to better assess preoperative cardiovascular risk.  Pre-op covering staff: - Please contact patient, obtain consent, and schedule appointment   Patient may hold aspirin for 7 days prior to surgery.  He should resume aspirin as soon as hemodynamically stable.   Emmaline Life, NP-C    04/04/2022, 9:31 AM [name] [address], [address] Office [phone] Fax [phone]

## 2022-04-05 ENCOUNTER — Encounter: Payer: Self-pay | Admitting: Nurse Practitioner

## 2022-04-05 ENCOUNTER — Ambulatory Visit (INDEPENDENT_AMBULATORY_CARE_PROVIDER_SITE_OTHER): Payer: Medicare Other | Admitting: Nurse Practitioner

## 2022-04-05 DIAGNOSIS — Z01810 Encounter for preprocedural cardiovascular examination: Secondary | ICD-10-CM

## 2022-04-05 NOTE — Progress Notes (Signed)
Virtual Visit via Telephone Note   Because of Samuel [name]'s co-morbid illnesses, he is at least at moderate risk for complications without adequate follow up.  This format is felt to be most appropriate for this patient at this time.  The patient did not have access to video technology/had technical difficulties with video requiring transitioning to audio format only (telephone).  All issues noted in this document were discussed and addressed.  No physical exam could be performed with this format.  Please refer to the patient's chart for his consent to telehealth for [facility].  Evaluation Performed:  Preoperative cardiovascular risk assessment _____________   Date:  04/05/2022   Patient ID:  Samuel [name], DOB 1932/08/25, MRN [id number] Patient Location:  Home Provider location:   Office  Primary Care Provider:  Virgie Dad, MD Primary Cardiologist:  Samuel Furbish, MD  Chief Complaint / Patient Profile   88 y.o. y/o male with a h/o s/p minimally invasive mitral valve repair, PAF, nonobstructive CAD by cath 2017, hypertension, bilateral carotid artery stenosis, hypothyroidism who is pending left total knee arthroplasty and presents today for telephonic preoperative cardiovascular risk assessment.  Past Medical History    Past Medical History:  Diagnosis Date   Aortoiliac occlusive disease ([place])    Carotid artery disease ([place])    Complication of anesthesia 1955   sodium pentathol ?, N/V   Contracture of palmar fascia 06/21/2010   Dysrhythmia    PAF   Enthesopathy of hip region 11/27/2009   Essential hypertension, benign 06/21/2010   GERD (gastroesophageal reflux disease)    occ   Head injury with loss of consciousness ([place]) 2001   short period   Heart murmur    Hyperlipidemia LDL goal <100 06/21/2010   Hypertrophy of prostate without urinary obstruction and other lower urinary tract symptoms (LUTS) 06/21/2010   Hypothyroidism 08/27/2011   Malignant neoplasm of  prostate ([place]) 06/21/2010   Mitral regurgitation 10/07/2015   Severe MR noted on ECHO with partially flail post leaflet Oct 03 2015    Osteoarthrosis, unspecified whether generalized or localized, unspecified site 06/21/2010   Other and unspecified hyperlipidemia 06/21/2010   Pain in joint, lower leg 02/25/2012   Palpitations 08/14/2010   Patent foramen ovale 01/04/2016   Closed at the time of mitral valve repair    PONV (postoperative nausea and vomiting)    S/P minimally invasive mitral valve repair 01/04/2016   Complex valvuloplasty including triangular resection of flail segment of posterior leaflet, artificial Gore-tex neochord placement x6 and 52m Sorin Memo 3D ring annuloplasty via right mini thoracotomy approach with closure of PFO and clipping of LA appendage    Unspecified constipation 06/21/2010   Unspecified essential hypertension 06/21/2010   Unspecified hypothyroidism 08/27/2011   Urinary frequency 08/14/2008   Past Surgical History:  Procedure Laterality Date   CARDIAC CATHETERIZATION N/A 11/10/2015   Procedure: Right/Left Heart Cath and Coronary Angiography;  Surgeon: Samuel M JMartinique MD;  [facility];  Service: Cardiovascular;  Laterality: N/A;   CATARACT EXTRACTION W/ INTRAOCULAR LENS  IMPLANT, BILATERAL  2006   CLIPPING OF ATRIAL APPENDAGE N/A 01/04/2016   Procedure: CLIPPING OF ATRIAL APPENDAGE;  Surgeon: CRexene Alberts MD;  [facility]  Service: Open Heart Surgery;  Laterality: N/A;   COLONOSCOPY     INGUINAL HERNIA REPAIR Left 12/19/2020   Procedure: LEFT OPEN INGUINAL HERNIA REPAIR WITHOUT MESH;  Surgeon: Samuel, LArta Bruce MD;  [facility]  Service: General;  Laterality:  Left;  ROOM 3 STARTING AT 11:30AM FOR 60 MIN   KNEE ARTHROSCOPY WITH MENISCAL REPAIR Left 1968   MITRAL VALVE REPAIR Right 01/04/2016   Procedure: MINIMALLY INVASIVE MITRAL VALVE REPAIR (MVR);  Surgeon: Samuel Alberts, MD;  Location: [place] [facility];  Service: Open  Heart Surgery;  Laterality: Right;   Open knee- ligament repair Right 1955   Dr. Ethel [name]   PROSTATE SURGERY  2000   seed implants   REVERSE SHOULDER ARTHROPLASTY Right 08/12/2020   Procedure: REVERSE SHOULDER ARTHROPLASTY;  Surgeon: Samuel Britain, MD;  Location: [place];  Service: Orthopedics;  Laterality: Right;  137mn   TEE WITHOUT CARDIOVERSION N/A 11/10/2015   Procedure: TRANSESOPHAGEAL ECHOCARDIOGRAM (TEE);  Surgeon: Samuel Latch MD;  [facility]  Service: Cardiovascular;  Laterality: N/A;   TEE WITHOUT CARDIOVERSION N/A 01/04/2016   Procedure: TRANSESOPHAGEAL ECHOCARDIOGRAM (TEE);  Surgeon: CRexene Alberts MD;  [facility]  Service: Open Heart Surgery;  Laterality: N/A;    Allergies  No Known Allergies  History of Present Illness    Samuel [name] a 88y.o. male who presents via audio/video conferencing for a telehealth visit today.  Pt was last seen in cardiology clinic on 09/20/2021 by MErmalinda Barrios PA.  At that time [name] doing well.  The patient is now pending procedure as outlined above. Since his last visit, he  denies chest pain, shortness of breath, lower extremity edema, fatigue, palpitations, melena, hematuria, hemoptysis, diaphoresis, weakness, presyncope, syncope, orthopnea, and PND. His walking is limited by knee pain but he is swimming and lifting weights 3 times per week without any concerns.   Home Medications    Prior to Admission medications   Medication Sig Start Date End Date Taking? Authorizing Provider  alfuzosin (UROXATRAL) 10 MG 24 hr tablet Take 10 mg by mouth daily with breakfast.    [provider]  amoxicillin (AMOXIL) 500 MG tablet Take 4 tablets 1 hour prior to dental procedures. 09/20/21   LImogene Burn PA-C  aspirin 81 MG tablet Take 81 mg by mouth every other day. for anticoagulation    [provider]  Levothyroxine Sodium (TIROSINT) 50 MCG CAPS TAKE 1 CAPSULE DAILY BEFORE BREAKFAST 02/19/22    GVirgie Dad MD  losartan (COZAAR) 25 MG tablet Take 0.5 tablets (12.5 mg total) by mouth daily. 09/20/21   LImogene Burn PA-C  nabumetone (RELAFEN) 750 MG tablet TAKE 1 TABLET TWICE A DAY 01/08/22   GVirgie Dad MD  nitroGLYCERIN (NITROSTAT) 0.4 MG SL tablet Place 1 tablet (0.4 mg total) under the tongue every 5 (five) minutes as needed for chest pain. 09/20/21   LImogene Burn PA-C  simvastatin (ZOCOR) 40 MG tablet Take 0.5 tablets (20 mg total) by mouth daily. 01/08/22   GVirgie Dad MD  torsemide (DEMADEX) 20 MG tablet Take 0.5 tablets (10 mg total) by mouth 3 (three) times a week. Take it as needed for LE swelling Patient taking differently: Take 20 mg by mouth 4 (four) times a week. 09/20/21   GVirgie Dad MD  triamcinolone cream (KENALOG) 0.1 % Apply 1 application. topically 2 (two) times daily. 02/21/22   GVirgie Dad MD  VESICARE 5 MG tablet Take 5 mg by mouth daily. 08/09/16   [provider]    Physical Exam    Vital Signs:  JBrevon Dewalddoes not have vital signs available for review today.  Given telephonic nature of communication, physical exam is limited. AAOx3. NAD.  Normal affect.  Speech and respirations are unlabored.  Accessory Clinical Findings    None  Assessment & Plan    1.  Preoperative Cardiovascular Risk Assessment: He is doing well from a cardiac perspective and may proceed to surgery without further testing. According to the Revised Cardiac Risk Index (RCRI), his Perioperative Risk of Major Cardiac Event is (%): 0.9. His Functional Capacity in METs is: 6.61 according to the [facility] Activity Status Index (DASI).  Patient may hold aspirin for 7 days prior to surgery.  He should resume aspirin as soon as hemodynamically stable.   A copy of this note will be routed to requesting surgeon.  Time:   Today, I have spent 10 minutes with the patient with telehealth technology discussing medical history, symptoms, and management plan.      Emmaline Life, NP-C    04/05/2022, 10:58 AM [facility] [address] Office [phone] Fax [phone]

## 2022-04-09 DIAGNOSIS — H40022 Open angle with borderline findings, high risk, left eye: Secondary | ICD-10-CM | POA: Diagnosis not present

## 2022-04-09 DIAGNOSIS — H26491 Other secondary cataract, right eye: Secondary | ICD-10-CM | POA: Diagnosis not present

## 2022-04-18 DIAGNOSIS — H401411 Capsular glaucoma with pseudoexfoliation of lens, right eye, mild stage: Secondary | ICD-10-CM | POA: Diagnosis not present

## 2022-04-18 DIAGNOSIS — H401423 Capsular glaucoma with pseudoexfoliation of lens, left eye, severe stage: Secondary | ICD-10-CM | POA: Diagnosis not present

## 2022-04-20 DIAGNOSIS — H401423 Capsular glaucoma with pseudoexfoliation of lens, left eye, severe stage: Secondary | ICD-10-CM | POA: Diagnosis not present

## 2022-04-24 ENCOUNTER — Other Ambulatory Visit: Payer: Self-pay | Admitting: Internal Medicine

## 2022-04-26 DIAGNOSIS — N189 Chronic kidney disease, unspecified: Secondary | ICD-10-CM | POA: Diagnosis not present

## 2022-04-26 DIAGNOSIS — E039 Hypothyroidism, unspecified: Secondary | ICD-10-CM | POA: Diagnosis not present

## 2022-04-26 LAB — BASIC METABOLIC PANEL
BUN: 22 — AB (ref 4–21)
CO2: 25 — AB (ref 13–22)
Chloride: 103 (ref 99–108)
Creatinine: 1.5 — AB (ref 0.6–1.3)
Glucose: 91
Potassium: 4.8 mEq/L (ref 3.5–5.1)
Sodium: 137 (ref 137–147)

## 2022-04-26 LAB — COMPREHENSIVE METABOLIC PANEL: Calcium: 9.2 (ref 8.7–10.7)

## 2022-04-26 LAB — CBC AND DIFFERENTIAL
HCT: 37 — AB (ref 41–53)
Hemoglobin: 12.6 — AB (ref 13.5–17.5)
Platelets: 201 10*3/uL (ref 150–400)
WBC: 4.2

## 2022-04-26 LAB — CBC: RBC: 3.98 (ref 3.87–5.11)

## 2022-04-26 LAB — TSH: TSH: 5.46 (ref 0.41–5.90)

## 2022-05-02 ENCOUNTER — Encounter: Payer: Self-pay | Admitting: Internal Medicine

## 2022-05-02 ENCOUNTER — Non-Acute Institutional Stay: Payer: Medicare Other | Admitting: Internal Medicine

## 2022-05-02 VITALS — BP 118/64 | HR 65 | Temp 98.0°F | Resp 18 | Ht 67.0 in | Wt 163.8 lb

## 2022-05-02 DIAGNOSIS — R600 Localized edema: Secondary | ICD-10-CM

## 2022-05-02 DIAGNOSIS — E785 Hyperlipidemia, unspecified: Secondary | ICD-10-CM | POA: Diagnosis not present

## 2022-05-02 DIAGNOSIS — I480 Paroxysmal atrial fibrillation: Secondary | ICD-10-CM

## 2022-05-02 DIAGNOSIS — N1831 Chronic kidney disease, stage 3a: Secondary | ICD-10-CM | POA: Diagnosis not present

## 2022-05-02 DIAGNOSIS — M130 Polyarthritis, unspecified: Secondary | ICD-10-CM | POA: Diagnosis not present

## 2022-05-02 DIAGNOSIS — E039 Hypothyroidism, unspecified: Secondary | ICD-10-CM | POA: Diagnosis not present

## 2022-05-02 DIAGNOSIS — I10 Essential (primary) hypertension: Secondary | ICD-10-CM | POA: Diagnosis not present

## 2022-05-02 DIAGNOSIS — M1712 Unilateral primary osteoarthritis, left knee: Secondary | ICD-10-CM | POA: Diagnosis not present

## 2022-05-02 MED ORDER — TORSEMIDE 20 MG PO TABS: 20.0000 mg | ORAL_TABLET | ORAL | 3 refills | Status: DC

## 2022-05-02 NOTE — Patient Instructions (Addendum)
Can take Turmeric OTC to see if it helps Arthritis Good luck for surgery Will see you in Rehab

## 2022-05-03 NOTE — Progress Notes (Signed)
Location: [facility] of Service:  Clinic (12)  Provider:   Code Status:  Goals of Care:     05/02/2022    8:11 AM  Advanced Directives  Does Patient Have a Medical Advance Directive? Yes  Type of Advance Directive Living will  Does patient want to make changes to medical advance directive? No - Patient declined     Chief Complaint  Patient presents with   Medical Management of Chronic Issues    Patient presents today for a 6 month follow up. Patient stated that he is out of the Torsemide because the Rx stated to take 1/2 tablet but he was told to take 1 tablet.     HPI: Patient is a 88 y.o. male seen today for an Routine visit for Follow up  He has h/o Arthritis Takes Relafen for past 8-9 years. Tapering has not helped LE edema  BPH with Frequency  Recent Inguinal Hernia repair  H/o Right Shoulder Arthroplasty MVR repair CKD Stage 3 A  LE edema Worse as he ran out of his Torsemide Plan to get Left Knee replacement in Aug and then plans to come to Rehab. Has daughter who will come and help Last Echo in 2018 EF 55 % . Does sees Cardiologist   No other issues    Past Medical History:  Diagnosis Date   Aortoiliac occlusive disease ([name])    Carotid artery disease ([place])    Complication of anesthesia 1955   sodium pentathol ?, N/V   Contracture of palmar fascia 06/21/2010   Dysrhythmia    PAF   Enthesopathy of hip region 11/27/2009   Essential hypertension, benign 06/21/2010   GERD (gastroesophageal reflux disease)    occ   Head injury with loss of consciousness ([place]) 2001   short period   Heart murmur    Hyperlipidemia LDL goal <100 06/21/2010   Hypertrophy of prostate without urinary obstruction and other lower urinary tract symptoms (LUTS) 06/21/2010   Hypothyroidism 08/27/2011   Malignant neoplasm of prostate ([place]) 06/21/2010   Mitral regurgitation 10/07/2015   Severe MR noted on ECHO with partially flail post leaflet Oct 03 2015     Osteoarthrosis, unspecified whether generalized or localized, unspecified site 06/21/2010   Other and unspecified hyperlipidemia 06/21/2010   Pain in joint, lower leg 02/25/2012   Palpitations 08/14/2010   Patent foramen ovale 01/04/2016   Closed at the time of mitral valve repair    PONV (postoperative nausea and vomiting)    S/P minimally invasive mitral valve repair 01/04/2016   Complex valvuloplasty including triangular resection of flail segment of posterior leaflet, artificial Gore-tex neochord placement x6 and 61m Sorin Memo 3D ring annuloplasty via right mini thoracotomy approach with closure of PFO and clipping of LA appendage    Unspecified constipation 06/21/2010   Unspecified essential hypertension 06/21/2010   Unspecified hypothyroidism 08/27/2011   Urinary frequency 08/14/2008    Past Surgical History:  Procedure Laterality Date   CARDIAC CATHETERIZATION N/A 11/10/2015   Procedure: Right/Left Heart Cath and Coronary Angiography;  Surgeon: Peter M JMartinique MD;  [facility];  Service: Cardiovascular;  Laterality: N/A;   CATARACT EXTRACTION W/ INTRAOCULAR LENS  IMPLANT, BILATERAL  2006   CLIPPING OF ATRIAL APPENDAGE N/A 01/04/2016   Procedure: CLIPPING OF ATRIAL APPENDAGE;  Surgeon: CRexene Alberts MD;  [facility]  Service: Open Heart Surgery;  Laterality: N/A;   COLONOSCOPY     INGUINAL HERNIA REPAIR Left 12/19/2020  Procedure: LEFT OPEN INGUINAL HERNIA REPAIR WITHOUT MESH;  Surgeon: Kinsinger, Arta Bruce, MD;  Location: [place];  Service: General;  Laterality: Left;  ROOM 3 STARTING AT 11:30AM FOR 60 MIN   KNEE ARTHROSCOPY WITH MENISCAL REPAIR Left 1968   MITRAL VALVE REPAIR Right 01/04/2016   Procedure: MINIMALLY INVASIVE MITRAL VALVE REPAIR (MVR);  Surgeon: Rexene Alberts, MD;  Location: [place];  Service: Open Heart Surgery;  Laterality: Right;   Open knee- ligament repair Right 1955   Dr. Ethel Rana   PROSTATE SURGERY  2000   seed implants    REVERSE SHOULDER ARTHROPLASTY Right 08/12/2020   Procedure: REVERSE SHOULDER ARTHROPLASTY;  Surgeon: Justice Britain, MD;  Location: [place];  Service: Orthopedics;  Laterality: Right;  184mn   TEE WITHOUT CARDIOVERSION N/A 11/10/2015   Procedure: TRANSESOPHAGEAL ECHOCARDIOGRAM (TEE);  Surgeon: TSkeet Latch MD;  [place] [facility]  Service: Cardiovascular;  Laterality: N/A;   TEE WITHOUT CARDIOVERSION N/A 01/04/2016   Procedure: TRANSESOPHAGEAL ECHOCARDIOGRAM (TEE);  Surgeon: CRexene Alberts MD;  [place] [facility]  Service: Open Heart Surgery;  Laterality: N/A;    No Known Allergies  Outpatient Encounter Medications as of 05/02/2022  Medication Sig   alfuzosin (UROXATRAL) 10 MG 24 hr tablet Take 10 mg by mouth daily with breakfast.   amoxicillin (AMOXIL) 500 MG tablet Take 4 tablets 1 hour prior to dental procedures.   aspirin 81 MG tablet Take 81 mg by mouth every other day. for anticoagulation   Levothyroxine Sodium (TIROSINT) 50 MCG CAPS TAKE 1 CAPSULE DAILY BEFORE BREAKFAST   losartan (COZAAR) 25 MG tablet Take 0.5 tablets (12.5 mg total) by mouth daily.   nabumetone (RELAFEN) 750 MG tablet TAKE 1 TABLET TWICE A DAY   nitroGLYCERIN (NITROSTAT) 0.4 MG SL tablet Place 1 tablet (0.4 mg total) under the tongue every 5 (five) minutes as needed for chest pain.   simvastatin (ZOCOR) 40 MG tablet Take 0.5 tablets (20 mg total) by mouth daily.   triamcinolone cream (KENALOG) 0.1 % Apply 1 application. topically 2 (two) times daily.   VESICARE 5 MG tablet Take 5 mg by mouth daily.   [DISCONTINUED] torsemide (DEMADEX) 20 MG tablet TAKE ONE-HALF (1/2) TABLET THREE TIMES A WEEK. TAKE AS NEEDED FOR LEG SWELLING (Patient taking differently: Patient takes 1 tablet daily)   torsemide (DEMADEX) 20 MG tablet Take 1 tablet (20 mg total) by mouth 3 (three) times a week.   No facility-administered encounter medications on file as of 05/02/2022.    Review of Systems:  Review of Systems  Constitutional:   Negative for activity change, appetite change and unexpected weight change.  HENT: Negative.    Respiratory:  Negative for cough and shortness of breath.   Cardiovascular:  Positive for leg swelling.  Gastrointestinal:  Negative for constipation.  Genitourinary:  Negative for frequency.  Musculoskeletal:  Positive for arthralgias and myalgias. Negative for gait problem.  Skin: Negative.  Negative for rash.  Neurological:  Negative for dizziness and weakness.  Psychiatric/Behavioral:  Negative for confusion and sleep disturbance.   All other systems reviewed and are negative.   Health Maintenance  Topic Date Due   INFLUENZA VACCINE  05/22/2022   TETANUS/TDAP  02/27/2025   Pneumonia Vaccine 88 Years old  Completed   COVID-19 Vaccine  Completed   Zoster Vaccines- Shingrix  Completed   HPV VACCINES  Aged Out    Physical Exam: Vitals:   05/02/22 0810  BP: 118/64  Pulse: 65  Resp: 18  Temp: 98 F (36.7 C)  TempSrc: Temporal  SpO2: 98%  Weight: 163 lb 12.8 oz (74.3 kg)  Height: '5\' 7"'$  (1.702 m)   Body mass index is 25.65 kg/m. Physical Exam Vitals reviewed.  Constitutional:      Appearance: Normal appearance.  HENT:     Head: Normocephalic.     Nose: Nose normal.     Mouth/Throat:     Mouth: Mucous membranes are moist.     Pharynx: Oropharynx is clear.  Eyes:     Pupils: Pupils are equal, round, and reactive to light.  Cardiovascular:     Rate and Rhythm: Normal rate and regular rhythm.     Pulses: Normal pulses.     Heart sounds: No murmur heard. Pulmonary:     Effort: Pulmonary effort is normal. No respiratory distress.     Breath sounds: Normal breath sounds. No rales.  Abdominal:     General: Abdomen is flat. Bowel sounds are normal.     Palpations: Abdomen is soft.  Musculoskeletal:        General: Swelling present.     Cervical back: Neck supple.  Skin:    General: Skin is warm.  Neurological:     General: No focal deficit present.     Mental Status:  He is alert and oriented to person, place, and time.  Psychiatric:        Mood and Affect: Mood normal.        Thought Content: Thought content normal.     Labs reviewed: Basic Metabolic Panel: Recent Labs    10/24/21 0000 03/06/22 0000 04/26/22 0000  NA 141 142 137  K 4.6 4.5 4.8  CL 101 105 103  CO2 24* 23* 25*  BUN 28* 27* 22*  CREATININE 1.5* 1.6* 1.5*  CALCIUM 9.1 8.7 9.2  TSH 5.60 4.45 5.46   Liver Function Tests: Recent Labs    07/25/21 0000 10/24/21 0000  AST 36 27  ALT 25 23  ALKPHOS 96 97  ALBUMIN 4.1 3.9   No results for input(s): "LIPASE", "AMYLASE" in the last 8760 hours. No results for input(s): "AMMONIA" in the last 8760 hours. CBC: Recent Labs    10/24/21 0000 04/26/22 0000  WBC 4.7 4.2  HGB 12.4* 12.6*  HCT 37* 37*  PLT 263 201   Lipid Panel: Recent Labs    07/25/21 0000 10/24/21 0000 03/06/22 0000  CHOL 161 185 160  HDL 68 63 59  LDLCALC 78 100 88  TRIG 74 106 70   Lab Results  Component Value Date   HGBA1C 5.5 01/02/2016    Procedures since last visit: No results found.  Assessment/Plan  1. Bilateral leg edema Torsemide 3/week Prescription renewed  2. Arthritis of left knee Plan for Replacement  3. Stage 3a chronic kidney disease (HCC) Creat stable Discussed with Patient  He will try to cut back his Relafen to one tablet Has been on it for many years for his Arthritis Possible renal Referal after his surgery  4. Arthritis of multiple sites Takes Relafen everyday He says GDR has failed He will try to reduce the dose  5. PAF (paroxysmal atrial fibrillation) (HCC) On Aspirin Has been off Toprol per Dr Marlou Porch  6. Hyperlipidemia LDL goal <100 On statins  7. Essential hypertension, benign On Cozaar  8. Hypothyroidism, unspecified type Tsh Mildly High 9 Urinary Frequency On Vesicare and Uroxatral  Labs/tests ordered:  * No order type specified * Next appt:  Visit date not found

## 2022-05-15 ENCOUNTER — Ambulatory Visit: Payer: Medicare Other | Admitting: Nurse Practitioner

## 2022-05-16 ENCOUNTER — Non-Acute Institutional Stay: Payer: Medicare Other | Admitting: Internal Medicine

## 2022-05-16 ENCOUNTER — Encounter: Payer: Self-pay | Admitting: Internal Medicine

## 2022-05-16 VITALS — BP 128/84 | HR 72 | Temp 96.8°F | Ht 67.0 in | Wt 161.6 lb

## 2022-05-16 DIAGNOSIS — M1712 Unilateral primary osteoarthritis, left knee: Secondary | ICD-10-CM

## 2022-05-16 DIAGNOSIS — N1831 Chronic kidney disease, stage 3a: Secondary | ICD-10-CM

## 2022-05-16 DIAGNOSIS — R600 Localized edema: Secondary | ICD-10-CM

## 2022-05-16 DIAGNOSIS — R519 Headache, unspecified: Secondary | ICD-10-CM | POA: Diagnosis not present

## 2022-05-16 NOTE — Progress Notes (Signed)
Location: [place] of Service:  Clinic (12)  Provider:   Code Status:  Goals of Care:     05/02/2022    8:11 AM  Advanced Directives  Does Patient Have a Medical Advance Directive? Yes  Type of Advance Directive Living will  Does patient want to make changes to medical advance directive? No - Patient declined     Chief Complaint  Patient presents with   Acute Visit    Complains of pain below Left Ear    HPI: Patient is a 88 y.o. male seen today for an acute visit for Pain below his Left Ear  Patient came with c/o Pain behind his left ear  Intermittent. Last for few sec and then go away No Ear pain or Discharge or fever  He does sleep on his Left side  Also Does do swimming 3/week   Other issues He has h/o Arthritis Takes Relafen for past 8-9 years. Tapering has not helped LE edema  BPH with Frequency  Recent Inguinal Hernia repair  H/o Right Shoulder Arthroplasty MVR repair CKD Stage 3 A Past Medical History:  Diagnosis Date   Aortoiliac occlusive disease ([place])    Carotid artery disease ([place])    Complication of anesthesia 1955   sodium pentathol ?, N/V   Contracture of palmar fascia 06/21/2010   Dysrhythmia    PAF   Enthesopathy of hip region 11/27/2009   Essential hypertension, benign 06/21/2010   GERD (gastroesophageal reflux disease)    occ   Head injury with loss of consciousness ([place]) 2001   short period   Heart murmur    Hyperlipidemia LDL goal <100 06/21/2010   Hypertrophy of prostate without urinary obstruction and other lower urinary tract symptoms (LUTS) 06/21/2010   Hypothyroidism 08/27/2011   Malignant neoplasm of prostate ([place]) 06/21/2010   Mitral regurgitation 10/07/2015   Severe MR noted on ECHO with partially flail post leaflet Oct 03 2015    Osteoarthrosis, unspecified whether generalized or localized, unspecified site 06/21/2010   Other and unspecified hyperlipidemia 06/21/2010   Pain in joint, lower leg  02/25/2012   Palpitations 08/14/2010   Patent foramen ovale 01/04/2016   Closed at the time of mitral valve repair    PONV (postoperative nausea and vomiting)    S/P minimally invasive mitral valve repair 01/04/2016   Complex valvuloplasty including triangular resection of flail segment of posterior leaflet, artificial Gore-tex neochord placement x6 and 12m Sorin Memo 3D ring annuloplasty via right mini thoracotomy approach with closure of PFO and clipping of LA appendage    Unspecified constipation 06/21/2010   Unspecified essential hypertension 06/21/2010   Unspecified hypothyroidism 08/27/2011   Urinary frequency 08/14/2008    Past Surgical History:  Procedure Laterality Date   CARDIAC CATHETERIZATION N/A 11/10/2015   Procedure: Right/Left Heart Cath and Coronary Angiography;  Surgeon: Peter M JMartinique MD;  [facility];  Service: Cardiovascular;  Laterality: N/A;   CATARACT EXTRACTION W/ INTRAOCULAR LENS  IMPLANT, BILATERAL  2006   CLIPPING OF ATRIAL APPENDAGE N/A 01/04/2016   Procedure: CLIPPING OF ATRIAL APPENDAGE;  Surgeon: CRexene Alberts MD;  [place] [facility]  Service: Open Heart Surgery;  Laterality: N/A;   COLONOSCOPY     INGUINAL HERNIA REPAIR Left 12/19/2020   Procedure: LEFT OPEN INGUINAL HERNIA REPAIR WITHOUT MESH;  Surgeon: Kinsinger, LArta Bruce MD;  [place] [facility]  Service: General;  Laterality: Left;  ROOM 3 STARTING AT 11:30AM FOR 60 MIN  KNEE ARTHROSCOPY WITH MENISCAL REPAIR Left 1968   MITRAL VALVE REPAIR Right 01/04/2016   Procedure: MINIMALLY INVASIVE MITRAL VALVE REPAIR (MVR);  Surgeon: Rexene Alberts, MD;  Location: [place];  Service: Open Heart Surgery;  Laterality: Right;   Open knee- ligament repair Right 1955   Dr. Ethel Rana   PROSTATE SURGERY  2000   seed implants   REVERSE SHOULDER ARTHROPLASTY Right 08/12/2020   Procedure: REVERSE SHOULDER ARTHROPLASTY;  Surgeon: Justice Britain, MD;  Location: [place];  Service: Orthopedics;   Laterality: Right;  183mn   TEE WITHOUT CARDIOVERSION N/A 11/10/2015   Procedure: TRANSESOPHAGEAL ECHOCARDIOGRAM (TEE);  Surgeon: TSkeet Latch MD;  [place] [facility]  Service: Cardiovascular;  Laterality: N/A;   TEE WITHOUT CARDIOVERSION N/A 01/04/2016   Procedure: TRANSESOPHAGEAL ECHOCARDIOGRAM (TEE);  Surgeon: CRexene Alberts MD;  [place] [facility]  Service: Open Heart Surgery;  Laterality: N/A;    No Known Allergies  Outpatient Encounter Medications as of 05/16/2022  Medication Sig   alfuzosin (UROXATRAL) 10 MG 24 hr tablet Take 10 mg by mouth daily with breakfast.   amoxicillin (AMOXIL) 500 MG tablet Take 4 tablets 1 hour prior to dental procedures.   aspirin 81 MG tablet Take 81 mg by mouth every other day. for anticoagulation   Levothyroxine Sodium (TIROSINT) 50 MCG CAPS TAKE 1 CAPSULE DAILY BEFORE BREAKFAST   losartan (COZAAR) 25 MG tablet Take 0.5 tablets (12.5 mg total) by mouth daily.   nabumetone (RELAFEN) 750 MG tablet TAKE 1 TABLET TWICE A DAY   nitroGLYCERIN (NITROSTAT) 0.4 MG SL tablet Place 1 tablet (0.4 mg total) under the tongue every 5 (five) minutes as needed for chest pain.   simvastatin (ZOCOR) 40 MG tablet Take 0.5 tablets (20 mg total) by mouth daily.   torsemide (DEMADEX) 20 MG tablet Take 1 tablet (20 mg total) by mouth 3 (three) times a week.   triamcinolone cream (KENALOG) 0.1 % Apply 1 application. topically 2 (two) times daily.   VESICARE 5 MG tablet Take 5 mg by mouth daily.   No facility-administered encounter medications on file as of 05/16/2022.    Review of Systems:  Review of Systems  Constitutional:  Negative for activity change, appetite change and unexpected weight change.  HENT: Negative.  Negative for ear discharge, ear pain and hearing loss.   Respiratory:  Negative for cough and shortness of breath.   Cardiovascular:  Positive for leg swelling.  Gastrointestinal:  Negative for constipation.  Genitourinary:  Negative for frequency.   Musculoskeletal:  Negative for arthralgias, gait problem and myalgias.  Skin: Negative.  Negative for rash.  Neurological:  Negative for dizziness and weakness.  Psychiatric/Behavioral:  Negative for confusion and sleep disturbance.   All other systems reviewed and are negative.   Health Maintenance  Topic Date Due   INFLUENZA VACCINE  05/22/2022   TETANUS/TDAP  02/27/2025   Pneumonia Vaccine 88 Years old  Completed   COVID-19 Vaccine  Completed   Zoster Vaccines- Shingrix  Completed   HPV VACCINES  Aged Out    Physical Exam: Vitals:   05/16/22 0808  BP: 128/84  Pulse: 72  Temp: (!) 96.8 F (36 C)  TempSrc: Skin  SpO2: 95%  Weight: 161 lb 9.6 oz (73.3 kg)  Height: '5\' 7"'$  (1.702 m)   Body mass index is 25.31 kg/m. Physical Exam Vitals reviewed.  Constitutional:      Appearance: Normal appearance.  HENT:     Head: Normocephalic.     Right Ear: Tympanic  membrane and ear canal normal.     Left Ear: Tympanic membrane and ear canal normal.     Ears:     Comments: Has some wax in both ears    Nose: Nose normal.     Mouth/Throat:     Mouth: Mucous membranes are moist.     Pharynx: Oropharynx is clear.     Comments: Neck exam was normal with No Lymph nodes Not tender behind his ear Did not feel any masses or Lumps Eyes:     Pupils: Pupils are equal, round, and reactive to light.  Cardiovascular:     Rate and Rhythm: Normal rate and regular rhythm.     Pulses: Normal pulses.     Heart sounds: No murmur heard. Pulmonary:     Effort: Pulmonary effort is normal. No respiratory distress.     Breath sounds: Normal breath sounds. No rales.  Abdominal:     General: Abdomen is flat. Bowel sounds are normal.     Palpations: Abdomen is soft.  Musculoskeletal:        General: Swelling present.     Cervical back: Neck supple.  Skin:    General: Skin is warm.  Neurological:     General: No focal deficit present.     Mental Status: He is alert and oriented to person,  place, and time.  Psychiatric:        Mood and Affect: Mood normal.        Thought Content: Thought content normal.     Labs reviewed: Basic Metabolic Panel: Recent Labs    10/24/21 0000 03/06/22 0000 04/26/22 0000  NA 141 142 137  K 4.6 4.5 4.8  CL 101 105 103  CO2 24* 23* 25*  BUN 28* 27* 22*  CREATININE 1.5* 1.6* 1.5*  CALCIUM 9.1 8.7 9.2  TSH 5.60 4.45 5.46   Liver Function Tests: Recent Labs    07/25/21 0000 10/24/21 0000  AST 36 27  ALT 25 23  ALKPHOS 96 97  ALBUMIN 4.1 3.9   No results for input(s): "LIPASE", "AMYLASE" in the last 8760 hours. No results for input(s): "AMMONIA" in the last 8760 hours. CBC: Recent Labs    10/24/21 0000 04/26/22 0000  WBC 4.7 4.2  HGB 12.4* 12.6*  HCT 37* 37*  PLT 263 201   Lipid Panel: Recent Labs    07/25/21 0000 10/24/21 0000 03/06/22 0000  CHOL 161 185 160  HDL 68 63 59  LDLCALC 78 100 88  TRIG 74 106 70   Lab Results  Component Value Date   HGBA1C 5.5 01/02/2016    Procedures since last visit: No results found.  Assessment/Plan 1. Left Temporal bone  pain ? Etiology No Pain today Exam Normal Try Sleeping on both sides Also Use Ear plugs when swimming  2. Bilateral leg edema On Torsemide  3. Arthritis of left knee Planning for surgery in Aug  4. Stage 3a chronic kidney disease ([place])     Labs/tests ordered:  * No order type specified * Next appt:  Visit date not found

## 2022-05-23 NOTE — H&P (Signed)
Patient's anticipated LOS is less than 2 midnights, meeting these requirements: - Younger than 83 - Lives within 1 hour of care - Has a competent adult at home to recover with post-op recover - NO history of  - Chronic pain requiring opiods  - Diabetes  - Coronary Artery Disease  - Heart failure  - Heart attack  - Stroke  - DVT/VTE  - Cardiac arrhythmia  - Respiratory Failure/COPD  - Renal failure  - Anemia  - Advanced Liver disease     Samuel [name] is an 88 y.o. male.    Chief Complaint: left knee pain  HPI: Pt is a 88 y.o. male complaining of left knee pain for multiple years. Pain had continually increased since the beginning. X-rays in the clinic show end-stage arthritic changes of the left knee. Pt has tried various conservative treatments which have failed to alleviate their symptoms, including injections and therapy. Various options are discussed with the patient. Risks, benefits and expectations were discussed with the patient. Patient understand the risks, benefits and expectations and wishes to proceed with surgery.   PCP:  Virgie Dad, MD  D/C Plans: Home  PMH: Past Medical History:  Diagnosis Date   Aortoiliac occlusive disease ([place])    Carotid artery disease ([place])    Complication of anesthesia 1955   sodium pentathol ?, N/V   Contracture of palmar fascia 06/21/2010   Dysrhythmia    PAF   Enthesopathy of hip region 11/27/2009   Essential hypertension, benign 06/21/2010   GERD (gastroesophageal reflux disease)    occ   Head injury with loss of consciousness ([place]) 2001   short period   Heart murmur    Hyperlipidemia LDL goal <100 06/21/2010   Hypertrophy of prostate without urinary obstruction and other lower urinary tract symptoms (LUTS) 06/21/2010   Hypothyroidism 08/27/2011   Malignant neoplasm of prostate ([place]) 06/21/2010   Mitral regurgitation 10/07/2015   Severe MR noted on ECHO with partially flail post leaflet Oct 03 2015    Osteoarthrosis,  unspecified whether generalized or localized, unspecified site 06/21/2010   Other and unspecified hyperlipidemia 06/21/2010   Pain in joint, lower leg 02/25/2012   Palpitations 08/14/2010   Patent foramen ovale 01/04/2016   Closed at the time of mitral valve repair    PONV (postoperative nausea and vomiting)    S/P minimally invasive mitral valve repair 01/04/2016   Complex valvuloplasty including triangular resection of flail segment of posterior leaflet, artificial Gore-tex neochord placement x6 and 55m Sorin Memo 3D ring annuloplasty via right mini thoracotomy approach with closure of PFO and clipping of LA appendage    Unspecified constipation 06/21/2010   Unspecified essential hypertension 06/21/2010   Unspecified hypothyroidism 08/27/2011   Urinary frequency 08/14/2008    PSH: Past Surgical History:  Procedure Laterality Date   CARDIAC CATHETERIZATION N/A 11/10/2015   Procedure: Right/Left Heart Cath and Coronary Angiography;  Surgeon: Peter M JMartinique MD;  [facility];  Service: Cardiovascular;  Laterality: N/A;   CATARACT EXTRACTION W/ INTRAOCULAR LENS  IMPLANT, BILATERAL  2006   CLIPPING OF ATRIAL APPENDAGE N/A 01/04/2016   Procedure: CLIPPING OF ATRIAL APPENDAGE;  Surgeon: CRexene Alberts MD;  [facility]  Service: Open Heart Surgery;  Laterality: N/A;   COLONOSCOPY     INGUINAL HERNIA REPAIR Left 12/19/2020   Procedure: LEFT OPEN INGUINAL HERNIA REPAIR WITHOUT MESH;  Surgeon: Kinsinger, LArta Bruce MD;  [place] [facility]  Service: General;  Laterality: Left;  ROOM 3 STARTING AT 11:30AM FOR 60 MIN   KNEE ARTHROSCOPY WITH MENISCAL REPAIR Left 1968   MITRAL VALVE REPAIR Right 01/04/2016   Procedure: MINIMALLY INVASIVE MITRAL VALVE REPAIR (MVR);  Surgeon: Rexene Alberts, MD;  Location: [facility];  Service: Open Heart Surgery;  Laterality: Right;   Open knee- ligament repair Right 1955   Dr. Ethel Rana   PROSTATE SURGERY  2000   seed implants   REVERSE  SHOULDER ARTHROPLASTY Right 08/12/2020   Procedure: REVERSE SHOULDER ARTHROPLASTY;  Surgeon: Justice Britain, MD;  Location: [facility];  Service: Orthopedics;  Laterality: Right;  139mn   TEE WITHOUT CARDIOVERSION N/A 11/10/2015   Procedure: TRANSESOPHAGEAL ECHOCARDIOGRAM (TEE);  Surgeon: TSkeet Latch MD;  [place] [facility]  Service: Cardiovascular;  Laterality: N/A;   TEE WITHOUT CARDIOVERSION N/A 01/04/2016   Procedure: TRANSESOPHAGEAL ECHOCARDIOGRAM (TEE);  Surgeon: CRexene Alberts MD;  [facility]  Service: Open Heart Surgery;  Laterality: N/A;    Social History:  reports that he quit smoking about 52 years ago. His smoking use included cigarettes and pipe. He has a 2.50 pack-year smoking history. He has never used smokeless tobacco. He reports current alcohol use of about 14.0 standard drinks of alcohol per week. He reports that he does not use drugs. BMI: Estimated body mass index is 25.31 kg/m as calculated from the following:   Height as of 05/16/22: '5\' 7"'$  (1.702 m).   Weight as of 05/16/22: 73.3 kg.  Lab Results  Component Value Date   ALBUMIN 3.9 10/24/2021   Diabetes: Patient does not have a diagnosis of diabetes.     Smoking Status: Social History   Tobacco Use  Smoking Status Former   Packs/day: 0.50   Years: 5.00   Total pack years: 2.50   Types: Cigarettes, Pipe   Quit date: 09/04/1969   Years since quitting: 52.7  Smokeless Tobacco Never   The patient is not currently a tobacco user. Counseling given: Not Answered     Allergies:  No Known Allergies  Medications: No current facility-administered medications for this encounter.   Current Outpatient Medications  Medication Sig Dispense Refill   alfuzosin (UROXATRAL) 10 MG 24 hr tablet Take 10 mg by mouth daily with breakfast.     amoxicillin (AMOXIL) 500 MG tablet Take 4 tablets 1 hour prior to dental procedures. 4 tablet 2   aspirin 81 MG tablet Take 81 mg by mouth every other day. for  anticoagulation     Levothyroxine Sodium (TIROSINT) 50 MCG CAPS TAKE 1 CAPSULE DAILY BEFORE BREAKFAST 90 capsule 3   losartan (COZAAR) 25 MG tablet Take 0.5 tablets (12.5 mg total) by mouth daily. 45 tablet 3   nabumetone (RELAFEN) 750 MG tablet TAKE 1 TABLET TWICE A DAY 180 tablet 3   nitroGLYCERIN (NITROSTAT) 0.4 MG SL tablet Place 1 tablet (0.4 mg total) under the tongue every 5 (five) minutes as needed for chest pain. 25 tablet 3   simvastatin (ZOCOR) 40 MG tablet Take 0.5 tablets (20 mg total) by mouth daily. 45 tablet 3   torsemide (DEMADEX) 20 MG tablet Take 1 tablet (20 mg total) by mouth 3 (three) times a week. 36 tablet 3   triamcinolone cream (KENALOG) 0.1 % Apply 1 application. topically 2 (two) times daily. 30 g 0   VESICARE 5 MG tablet Take 5 mg by mouth daily.      No results found for this or any previous visit (from the past 48 hour(s)). No results found.  ROS:  Pain with rom of the left lower extremity  Physical Exam: Alert and oriented 87 y.o. male in no acute distress Cranial nerves 2-12 intact Cervical spine: full rom with no tenderness, nv intact distally Chest: active breath sounds bilaterally, no wheeze rhonchi or rales Heart: regular rate and rhythm, no murmur Abd: non tender non distended with active bowel sounds Hip is stable with rom  Left knee painful rom with crepitus Nv intact distally No rashes or edema distally Antalgic gait  Assessment/Plan Assessment: left knee end stage osteoarthritis  Plan:  Patient will undergo a left total knee by Dr. Veverly Fells at [facility] Risks benefits and expectations were discussed with the patient. Patient understand risks, benefits and expectations and wishes to proceed. Preoperative templating of the joint replacement has been completed, documented, and submitted to the Operating Room personnel in order to optimize intra-operative equipment management.   Merla Riches PA-C, MPAS [facility] is now [employer] [address] [place], [place], [place] [address] Phone: [phone] [email or web address] Facebook  [employer]

## 2022-05-30 NOTE — Patient Instructions (Addendum)
DUE TO SPACE LIMITATIONS, ONLY TWO VISITORS  (aged 86 and older) ARE ALLOWED TO COME WITH YOU AND STAY IN THE WAITING ROOM DURING YOUR PRE OP AND PROCEDURE.   **NO VISITORS ARE ALLOWED IN THE SHORT STAY AREA OR RECOVERY ROOM!!**  IF YOU WILL BE ADMITTED INTO THE HOSPITAL YOU ARE ALLOWED ONLY FOUR SUPPORT PEOPLE DURING VISITATION HOURS (7 AM -8PM)   The support person(s) must pass our screening, and use Hand sanitizing gel. Visitors GUEST BADGE MUST BE WORN VISIBLY  One adult visitor may remain with you overnight and MUST be in the room by 8 P.M.   You are not required to quarantine at this time prior to your surgery. However, you must do this: Hand Hygiene often Do NOT share personal items Notify your provider if you are in close contact with someone who has COVID or you develop fever 100.4 or greater, new onset of sneezing, cough, sore throat, shortness of breath or body aches.       Your procedure is scheduled on: Friday June 08, 2022   Report to [facility] Main Entrance.  Report to admitting at: 0515 AM  +++++Call this number if you have any questions or problems the morning of surgery [phone]  Do not eat food :After Midnight the night prior to your surgery/procedure.  After Midnight you may have the following liquids until   04:30 AM DAY OF SURGERY  Clear Liquid Diet Water Black Coffee (sugar ok, NO MILK/CREAM OR CREAMERS)  Tea (sugar ok, NO MILK/CREAM OR CREAMERS) regular and decaf                             Plain Jell-O (NO RED)                                           Fruit ices (not with fruit pulp, NO RED)                                     Popsicles (NO RED)                                                                  Juice: apple, WHITE grape, WHITE cranberry Sports drinks like Gatorade (NO RED)                   The day of surgery:  Drink ONE (1) Pre-Surgery Clear Ensure  at    04:30 AM the morning of surgery. Drink in one sitting. Do not  sip.  This drink was given to you during your hospital pre-op appointment visit. Nothing else to drink after completing the Pre-Surgery Clear Ensure    FOLLOW  ANY ADDITIONAL PRE OP INSTRUCTIONS YOU RECEIVED FROM YOUR SURGEON'S OFFICE!!!   Oral Hygiene is also important to reduce your risk of infection.        Remember - BRUSH YOUR TEETH THE MORNING OF SURGERY WITH YOUR REGULAR TOOTHPASTE   Take ONLY these medicines the morning of surgery with A SIP OF  WATER: Alfuzosin (Uroxatral), Levothyroxine, and Vesicare                   You may not have any metal on your body including jewelry, and body piercing  Do not wear  lotions, powders, cologne, or deodorant  Men may shave face and neck.  You may bring a small overnight bag with you on the day of surgery, only pack items that are not valuable .[facility] IS NOT RESPONSIBLE   FOR VALUABLES THAT ARE LOST OR STOLEN.   DO NOT [place]. PHARMACY WILL DISPENSE MEDICATIONS LISTED ON YOUR MEDICATION LIST TO YOU DURING YOUR ADMISSION [place]!   Special Instructions: Bring a copy of your healthcare power of attorney and living will documents the day of surgery, if you wish to have them scanned into your [facility] Records- EPIC  Please read over the following fact sheets you were given: IF YOU HAVE QUESTIONS ABOUT YOUR PRE-OP INSTRUCTIONS, PLEASE CALL [phone]  ([place])   [facility] - Preparing for Surgery Before surgery, you can play an important role.  Because skin is not sterile, your skin needs to be as free of germs as possible.  You can reduce the number of germs on your skin by washing with CHG (chlorahexidine gluconate) soap before surgery.  CHG is an antiseptic cleaner which kills germs and bonds with the skin to continue killing germs even after washing. Please DO NOT use if you have an allergy to CHG or antibacterial soaps.  If your skin becomes reddened/irritated stop using the CHG  and inform your nurse when you arrive at Short Stay. Do not shave (including legs and underarms) for at least 48 hours prior to the first CHG shower.  You may shave your face/neck.  Please follow these instructions carefully:  1.  Shower with CHG Soap the night before surgery and the  morning of surgery.  2.  If you choose to wash your hair, wash your hair first as usual with your normal  shampoo.  3.  After you shampoo, rinse your hair and body thoroughly to remove the shampoo.                             4.  Use CHG as you would any other liquid soap.  You can apply chg directly to the skin and wash.  Gently with a scrungie or clean washcloth.  5.  Apply the CHG Soap to your body ONLY FROM THE NECK DOWN.   Do not use on face/ open                           Wound or open sores. Avoid contact with eyes, ears mouth and genitals (private parts).                       Wash face,  Genitals (private parts) with your normal soap.             6.  Wash thoroughly, paying special attention to the area where your  surgery  will be performed.  7.  Thoroughly rinse your body with warm water from the neck down.  8.  DO NOT shower/wash with your normal soap after using and rinsing off the CHG Soap.            9.  Pat yourself  dry with a clean towel.            10.  Wear clean pajamas.            11.  Place clean sheets on your bed the night of your first shower and do not  sleep with pets.  ON THE DAY OF SURGERY : Do not apply any lotions/deodorants the morning of surgery.  Please wear clean clothes to the hospital/surgery center.    FAILURE TO FOLLOW THESE INSTRUCTIONS MAY RESULT IN THE CANCELLATION OF YOUR SURGERY  PATIENT SIGNATURE_________________________________  NURSE SIGNATURE__________________________________  ________________________________________________________________________   Samuel [name]    An incentive spirometer is a tool that can help keep your lungs clear and active.  This tool measures how well you are filling your lungs with each breath. Taking long deep breaths may help reverse or decrease the chance of developing breathing (pulmonary) problems (especially infection) following: A long period of time when you are unable to move or be active. BEFORE THE PROCEDURE  If the spirometer includes an indicator to show your best effort, your nurse or respiratory therapist will set it to a desired goal. If possible, sit up straight or lean slightly forward. Try not to slouch. Hold the incentive spirometer in an upright position. INSTRUCTIONS FOR USE  Sit on the edge of your bed if possible, or sit up as far as you can in bed or on a chair. Hold the incentive spirometer in an upright position. Breathe out normally. Place the mouthpiece in your mouth and seal your lips tightly around it. Breathe in slowly and as deeply as possible, raising the piston or the ball toward the top of the column. Hold your breath for 3-5 seconds or for as long as possible. Allow the piston or ball to fall to the bottom of the column. Remove the mouthpiece from your mouth and breathe out normally. Rest for a few seconds and repeat Steps 1 through 7 at least 10 times every 1-2 hours when you are awake. Take your time and take a few normal breaths between deep breaths. The spirometer may include an indicator to show your best effort. Use the indicator as a goal to work toward during each repetition. After each set of 10 deep breaths, practice coughing to be sure your lungs are clear. If you have an incision (the cut made at the time of surgery), support your incision when coughing by placing a pillow or rolled up towels firmly against it. Once you are able to get out of bed, walk around indoors and cough well. You may stop using the incentive spirometer when instructed by your caregiver.  RISKS AND COMPLICATIONS Take your time so you do not get dizzy or light-headed. If you are in pain, you may  need to take or ask for pain medication before doing incentive spirometry. It is harder to take a deep breath if you are having pain. AFTER USE Rest and breathe slowly and easily. It can be helpful to keep track of a log of your progress. Your caregiver can provide you with a simple table to help with this. If you are using the spirometer at home, follow these instructions: [place] IF:  You are having difficultly using the spirometer. You have trouble using the spirometer as often as instructed. Your pain medication is not giving enough relief while using the spirometer. You develop fever of 100.5 F (38.1 C) or higher.  SEEK IMMEDIATE MEDICAL CARE IF:  You cough up bloody sputum that had not been present before. You develop fever of 102 F (38.9 C) or greater. You develop worsening pain at or near the incision site. MAKE SURE YOU:  Understand these instructions. Will watch your condition. Will get help right away if you are not doing well or get worse. Document Released: 02/18/2007 Document Revised: 12/31/2011 Document Reviewed: 04/21/2007 [facility] Patient Information 2014 [place], [place].

## 2022-05-30 NOTE — Progress Notes (Addendum)
COVID Vaccine received:  '[]'$  No '[x]'$  Yes Date of any COVID positive Test in last 24 days:None  PCP - Veleta Miners, MD Cardiologist - Candee Furbish, MD      Cardiac ClearanceAnn Maki, NP 04-05-22 note  Chest x-ray - 2017  Epic EKG -  09-20-21  Epic Stress Test -  ECHO - 2018   Epic Cardiac Cath - 10-2015  Dr Peter Martinique  Epic  Pacemaker/ICD device     '[x]'$  N/A Spinal Cord Stimulator:'[x]'$  No '[]'$  Yes   Other Implants:   History of Sleep Apnea? '[x]'$  No '[]'$  Yes    Does the patient monitor blood sugar? '[]'$  No '[]'$  Yes  '[x]'$  N/A  Blood Thinner Instructions: Aspirin Instructions: Hold 7 days, ok per Elwyn Reach, NP Last Dose:05-01-22  ERAS Protocol Ordered: '[]'$  No  '[x]'$  Yes PRE-SURGERY '[x]'$  ENSURE  '[]'$  G2   Comments: lives at [facility]   Activity level: Patient can climb a flight of stairs without difficulty;  '[x]'$  No CP  '[x]'$  No SOB,  but would have __knee pain   Anesthesia review: PONV, Hx MVR/ clipping atrial appendage/PFO closure 12-2015, A.Fib, CKD 3A  Patient denies shortness of breath, fever, cough and chest pain at PAT appointment.  Patient verbalized understanding and agreement to the Pre-Surgical Instructions that were given to them at this PAT appointment. Patient was also educated of the need to review these PAT instructions again prior to his/her surgery.I reviewed the appropriate phone numbers to call if they have any and questions or concerns.

## 2022-06-01 ENCOUNTER — Other Ambulatory Visit: Payer: Self-pay

## 2022-06-01 ENCOUNTER — Encounter (HOSPITAL_COMMUNITY): Payer: Self-pay

## 2022-06-01 ENCOUNTER — Encounter (HOSPITAL_COMMUNITY)
Admission: RE | Admit: 2022-06-01 | Discharge: 2022-06-01 | Disposition: A | Discharge status: Home / Self Care | Payer: Medicare Other | Source: Ambulatory Visit | Attending: Orthopedic Surgery | Admitting: Orthopedic Surgery

## 2022-06-01 VITALS — BP 127/85 | HR 1 | Temp 97.7°F | Resp 14 | Ht 67.0 in | Wt 158.0 lb

## 2022-06-01 DIAGNOSIS — E039 Hypothyroidism, unspecified: Secondary | ICD-10-CM | POA: Diagnosis not present

## 2022-06-01 DIAGNOSIS — I129 Hypertensive chronic kidney disease with stage 1 through stage 4 chronic kidney disease, or unspecified chronic kidney disease: Secondary | ICD-10-CM | POA: Insufficient documentation

## 2022-06-01 DIAGNOSIS — Z01812 Encounter for preprocedural laboratory examination: Secondary | ICD-10-CM | POA: Insufficient documentation

## 2022-06-01 DIAGNOSIS — Z8546 Personal history of malignant neoplasm of prostate: Secondary | ICD-10-CM | POA: Insufficient documentation

## 2022-06-01 DIAGNOSIS — M1712 Unilateral primary osteoarthritis, left knee: Secondary | ICD-10-CM | POA: Insufficient documentation

## 2022-06-01 DIAGNOSIS — Z01818 Encounter for other preprocedural examination: Secondary | ICD-10-CM

## 2022-06-01 DIAGNOSIS — I10 Essential (primary) hypertension: Secondary | ICD-10-CM

## 2022-06-01 DIAGNOSIS — Z87891 Personal history of nicotine dependence: Secondary | ICD-10-CM | POA: Insufficient documentation

## 2022-06-01 DIAGNOSIS — I480 Paroxysmal atrial fibrillation: Secondary | ICD-10-CM | POA: Diagnosis not present

## 2022-06-01 DIAGNOSIS — N183 Chronic kidney disease, stage 3 unspecified: Secondary | ICD-10-CM | POA: Insufficient documentation

## 2022-06-01 LAB — CBC
HCT: 37.5 % — ABNORMAL LOW (ref 39.0–52.0)
Hemoglobin: 12.4 g/dL — ABNORMAL LOW (ref 13.0–17.0)
MCH: 30.8 pg (ref 26.0–34.0)
MCHC: 33.1 g/dL (ref 30.0–36.0)
MCV: 93.3 fL (ref 80.0–100.0)
Platelets: 221 10*3/uL (ref 150–400)
RBC: 4.02 MIL/uL — ABNORMAL LOW (ref 4.22–5.81)
RDW: 12.9 % (ref 11.5–15.5)
WBC: 4.4 10*3/uL (ref 4.0–10.5)
nRBC: 0 % (ref 0.0–0.2)

## 2022-06-01 LAB — BASIC METABOLIC PANEL
Anion gap: 6 (ref 5–15)
BUN: 28 mg/dL — ABNORMAL HIGH (ref 8–23)
CO2: 26 mmol/L (ref 22–32)
Calcium: 9.2 mg/dL (ref 8.9–10.3)
Chloride: 107 mmol/L (ref 98–111)
Creatinine, Ser: 1.7 mg/dL — ABNORMAL HIGH (ref 0.61–1.24)
GFR, Estimated: 38 mL/min — ABNORMAL LOW (ref >60)
Glucose, Bld: 79 mg/dL (ref 70–99)
Potassium: 4.3 mmol/L (ref 3.5–5.1)
Sodium: 139 mmol/L (ref 135–145)

## 2022-06-01 LAB — SURGICAL PCR SCREEN
MRSA, PCR: NEGATIVE
Staphylococcus aureus: NEGATIVE

## 2022-06-04 DIAGNOSIS — H401411 Capsular glaucoma with pseudoexfoliation of lens, right eye, mild stage: Secondary | ICD-10-CM | POA: Diagnosis not present

## 2022-06-04 DIAGNOSIS — H401423 Capsular glaucoma with pseudoexfoliation of lens, left eye, severe stage: Secondary | ICD-10-CM | POA: Diagnosis not present

## 2022-06-04 NOTE — Anesthesia Preprocedure Evaluation (Addendum)
Anesthesia Evaluation  Patient identified by MRN, date of birth, ID band Patient awake    Reviewed: Allergy & Precautions, NPO status , Patient's Chart, lab work & pertinent test results  History of Anesthesia Complications (+) PONV and history of anesthetic complications  Airway Mallampati: II  TM Distance: >3 FB Neck ROM: Full    Dental no notable dental hx.    Pulmonary former smoker,    Pulmonary exam normal        Cardiovascular hypertension, Pt. on medications + Peripheral Vascular Disease  Normal cardiovascular exam  s/p minimally invasive mitral valve repair 2017  Echo 08/26/2017: EF 55%, moderate LVH, grade I DD, moderate LAE, normally functioning prosthetic mitral valve repair with trace regurgitation, mild TR    Neuro/Psych negative neurological ROS  negative psych ROS   GI/Hepatic Neg liver ROS, GERD  ,  Endo/Other  Hypothyroidism   Renal/GU Renal InsufficiencyRenal disease (Cr 1.7)  negative genitourinary   Musculoskeletal  (+) Arthritis ,   Abdominal   Peds  Hematology negative hematology ROS (+)   Anesthesia Other Findings Day of surgery medications reviewed with patient.  Reproductive/Obstetrics negative OB ROS                           Anesthesia Physical Anesthesia Plan  ASA: 3  Anesthesia Plan: Spinal   Post-op Pain Management: Regional block* and Tylenol PO (pre-op)*   Induction:   PONV Risk Score and Plan: 3 and Treatment may vary due to age or medical condition, Ondansetron, Propofol infusion and Dexamethasone  Airway Management Planned: Natural Airway and Simple Face Mask  Additional Equipment: None  Intra-op Plan:   Post-operative Plan:   Informed Consent: I have reviewed the patients History and Physical, chart, labs and discussed the procedure including the risks, benefits and alternatives for the proposed anesthesia with the patient or authorized  representative who has indicated his/her understanding and acceptance.       Plan Discussed with: CRNA  Anesthesia Plan Comments: (See PAT note 06/01/2022)      Anesthesia Quick Evaluation

## 2022-06-04 NOTE — Progress Notes (Signed)
Anesthesia Chart Review   Case: [id number] Date/Time: 06/08/22 0715   Procedure: LEFT TOTAL KNEE ARTHROPLASTY (Left: Knee)   Anesthesia type: Spinal   Pre-op diagnosis: left knee end stage osteoarthritis   Location: [facility] ROOM 06 / [facility]   Surgeons: Netta Cedars, MD       DISCUSSION:88 y.o.o former smoker with h/o PONV, HTN, hypothyroidism, carotid artery disease, s/p minimally invasive mitral valve repair 2017, PAF, CKD Stage III, prostate cancer, left knee OA scheduled for above procedure 06/08/2022 with Dr. Netta Cedars.   Pt last seen by cardiology 04/05/2022. Per OV note, "Preoperative Cardiovascular Risk Assessment: He is doing well from a cardiac perspective and may proceed to surgery without further testing. According to the Revised Cardiac Risk Index (RCRI), his Perioperative Risk of Major Cardiac Event is (%): 0.9. His Functional Capacity in METs is: 6.61 according to the Duke Activity Status Index (DASI).   Patient may hold aspirin for 7 days prior to surgery.  He should resume aspirin as soon as hemodynamically stable."  Anticipate pt can proceed with planned procedure barring acute status change.   VS: BP 127/85   Pulse (!) 1   Temp 36.5 C (Oral)   Resp 14   Ht '5\' 7"'$  (1.702 m)   Wt 71.7 kg   BMI 24.75 kg/m   PROVIDERS: Virgie Dad, MD is PCP   Primary Cardiologist:  Candee Furbish, MD LABS: Labs reviewed: Acceptable for surgery. (all labs ordered are listed, but only abnormal results are displayed)  Labs Reviewed  BASIC METABOLIC PANEL - Abnormal; Notable for the following components:      Result Value   BUN 28 (*)    Creatinine, Ser 1.70 (*)    GFR, Estimated 38 (*)    All other components within normal limits  CBC - Abnormal; Notable for the following components:   RBC 4.02 (*)    Hemoglobin 12.4 (*)    HCT 37.5 (*)    All other components within normal limits  SURGICAL PCR SCREEN     IMAGES:   EKG:   CV: Echo 08/26/2017 Moderate concentric LVH  with normal global wall motion. Doppler evidence of grade I (impaired) diastolic dysfunction. Estimated EF 55%.  Moderate left atrial enlargement.  Normally functioning prosthetic mitral valve repair with trace regurgitation. Mildly restricted mitral valve leaflets.  Mild tricuspid regurgitation Trace pulmonic regurgitation IVC is dilated with respiratory variation Since last ECHO interval improvement in LV EF Past Medical History:  Diagnosis Date   Aortoiliac occlusive disease ([place])    Carotid artery disease ([place])    Complication of anesthesia 1955   sodium pentathol ?, N/V   Contracture of palmar fascia 06/21/2010   Dysrhythmia    PAF   Enthesopathy of hip region 11/27/2009   Essential hypertension, benign 06/21/2010   GERD (gastroesophageal reflux disease)    occ   Head injury with loss of consciousness ([place]) 2001   short period   Heart murmur    Hyperlipidemia LDL goal <100 06/21/2010   Hypertrophy of prostate without urinary obstruction and other lower urinary tract symptoms (LUTS) 06/21/2010   Hypothyroidism 08/27/2011   Malignant neoplasm of prostate ([place]) 06/21/2010   Mitral regurgitation 10/07/2015   Severe MR noted on ECHO with partially flail post leaflet Oct 03 2015    Osteoarthrosis, unspecified whether generalized or localized, unspecified site 06/21/2010   Other and unspecified hyperlipidemia 06/21/2010   Pain in joint, lower leg 02/25/2012   Palpitations 08/14/2010   Patent foramen  ovale 01/04/2016   Closed at the time of mitral valve repair    PONV (postoperative nausea and vomiting)    S/P minimally invasive mitral valve repair 01/04/2016   Complex valvuloplasty including triangular resection of flail segment of posterior leaflet, artificial Gore-tex neochord placement x6 and 47m Sorin Memo 3D ring annuloplasty via right mini thoracotomy approach with closure of PFO and clipping of LA appendage    Unspecified constipation 06/21/2010   Unspecified essential hypertension  06/21/2010   Unspecified hypothyroidism 08/27/2011   Urinary frequency 08/14/2008    Past Surgical History:  Procedure Laterality Date   CARDIAC CATHETERIZATION N/A 11/10/2015   Procedure: Right/Left Heart Cath and Coronary Angiography;  Surgeon: Peter M JMartinique MD;  [facility];  Service: Cardiovascular;  Laterality: N/A;   CATARACT EXTRACTION W/ INTRAOCULAR LENS  IMPLANT, BILATERAL  2006   CLIPPING OF ATRIAL APPENDAGE N/A 01/04/2016   Procedure: CLIPPING OF ATRIAL APPENDAGE;  Surgeon: CRexene Alberts MD;  Location: [facility]  Service: Open Heart Surgery;  Laterality: N/A;   COLONOSCOPY     CORONARY ANGIOPLASTY  2017   Dr JMartinique  INGUINAL HERNIA REPAIR Left 12/19/2020   Procedure: LEFT OPEN INGUINAL HERNIA REPAIR WITHOUT MESH;  Surgeon: Kinsinger, LArta Bruce MD;  [facility]  Service: General;  Laterality: Left;  ROOM 3 STARTING AT 11:30AM FOR 60 MIN   KNEE ARTHROSCOPY WITH MENISCAL REPAIR Left 1968   MITRAL VALVE REPAIR Right 01/04/2016   Procedure: MINIMALLY INVASIVE MITRAL VALVE REPAIR (MVR);  Surgeon: CRexene Alberts MD;  [facility]  Service: Open Heart Surgery;  Laterality: Right;   Open knee- ligament repair Right 1955   Dr. PEthel Rana  PROSTATE SURGERY  2000   seed implants   REVERSE SHOULDER ARTHROPLASTY Right 08/12/2020   Procedure: REVERSE SHOULDER ARTHROPLASTY;  Surgeon: SJustice Britain MD;  [facility]  Service: Orthopedics;  Laterality: Right;  1266m   TEE WITHOUT CARDIOVERSION N/A 11/10/2015   Procedure: TRANSESOPHAGEAL ECHOCARDIOGRAM (TEE);  Surgeon: TiSkeet LatchMD;  [facility] Service: Cardiovascular;  Laterality: N/A;   TEE WITHOUT CARDIOVERSION N/A 01/04/2016   Procedure: TRANSESOPHAGEAL ECHOCARDIOGRAM (TEE);  Surgeon: ClRexene AlbertsMD;  [facility] Service: Open Heart Surgery;  Laterality: N/A;    MEDICATIONS:  alfuzosin (UROXATRAL) 10 MG 24 hr tablet   amoxicillin (AMOXIL) 500 MG  tablet   aspirin 81 MG tablet   dupilumab (DUPIXENT) 300 MG/2ML prefilled syringe   Levothyroxine Sodium (TIROSINT) 50 MCG CAPS   losartan (COZAAR) 25 MG tablet   nabumetone (RELAFEN) 750 MG tablet   nitroGLYCERIN (NITROSTAT) 0.4 MG SL tablet   simvastatin (ZOCOR) 40 MG tablet   torsemide (DEMADEX) 20 MG tablet   triamcinolone cream (KENALOG) 0.1 %   VESICARE 5 MG tablet   No current facility-administered medications for this encounter.    JeKonrad Felixard, PA-C [facility]-Surgical Testing [phone]

## 2022-06-08 ENCOUNTER — Ambulatory Visit (HOSPITAL_COMMUNITY): Payer: Medicare Other | Admitting: Physician Assistant

## 2022-06-08 ENCOUNTER — Observation Stay (HOSPITAL_COMMUNITY)
Admission: RE | Admit: 2022-06-08 | Discharge: 2022-06-09 | Disposition: A | Discharge status: Skilled Nursing Facility | Payer: Medicare Other | Source: Ambulatory Visit | Attending: Orthopedic Surgery | Admitting: Orthopedic Surgery

## 2022-06-08 ENCOUNTER — Other Ambulatory Visit: Payer: Self-pay

## 2022-06-08 ENCOUNTER — Ambulatory Visit (HOSPITAL_BASED_OUTPATIENT_CLINIC_OR_DEPARTMENT_OTHER): Payer: Medicare Other | Admitting: Anesthesiology

## 2022-06-08 ENCOUNTER — Encounter (HOSPITAL_COMMUNITY): Payer: Self-pay | Admitting: Orthopedic Surgery

## 2022-06-08 ENCOUNTER — Encounter (HOSPITAL_COMMUNITY)
Admission: RE | Disposition: A | Discharge status: Skilled Nursing Facility | Payer: Self-pay | Source: Ambulatory Visit | Attending: Orthopedic Surgery

## 2022-06-08 DIAGNOSIS — Z7982 Long term (current) use of aspirin: Secondary | ICD-10-CM | POA: Insufficient documentation

## 2022-06-08 DIAGNOSIS — Z87891 Personal history of nicotine dependence: Secondary | ICD-10-CM

## 2022-06-08 DIAGNOSIS — E039 Hypothyroidism, unspecified: Secondary | ICD-10-CM | POA: Insufficient documentation

## 2022-06-08 DIAGNOSIS — I10 Essential (primary) hypertension: Secondary | ICD-10-CM | POA: Insufficient documentation

## 2022-06-08 DIAGNOSIS — Z96611 Presence of right artificial shoulder joint: Secondary | ICD-10-CM | POA: Insufficient documentation

## 2022-06-08 DIAGNOSIS — Z79899 Other long term (current) drug therapy: Secondary | ICD-10-CM | POA: Insufficient documentation

## 2022-06-08 DIAGNOSIS — I2510 Atherosclerotic heart disease of native coronary artery without angina pectoris: Secondary | ICD-10-CM | POA: Diagnosis not present

## 2022-06-08 DIAGNOSIS — M1712 Unilateral primary osteoarthritis, left knee: Secondary | ICD-10-CM | POA: Diagnosis not present

## 2022-06-08 DIAGNOSIS — I480 Paroxysmal atrial fibrillation: Secondary | ICD-10-CM | POA: Diagnosis not present

## 2022-06-08 DIAGNOSIS — G8918 Other acute postprocedural pain: Secondary | ICD-10-CM | POA: Diagnosis not present

## 2022-06-08 DIAGNOSIS — Z96652 Presence of left artificial knee joint: Secondary | ICD-10-CM

## 2022-06-08 HISTORY — PX: TOTAL KNEE ARTHROPLASTY: SHX125

## 2022-06-08 SURGERY — ARTHROPLASTY, KNEE, TOTAL
Anesthesia: Spinal | Site: Knee | Laterality: Left

## 2022-06-08 MED ORDER — FENTANYL CITRATE PF 50 MCG/ML IJ SOSY: 25.0000 ug | PREFILLED_SYRINGE | INTRAMUSCULAR | Status: DC | PRN

## 2022-06-08 MED ORDER — ALFUZOSIN HCL ER 10 MG PO TB24
10.0000 mg | ORAL_TABLET | Freq: Every day | ORAL | Status: DC
  Administered 2022-06-09: 10 mg via ORAL
  Filled 2022-06-08: qty 1

## 2022-06-08 MED ORDER — CEFAZOLIN SODIUM-DEXTROSE 2-4 GM/100ML-% IV SOLN
2.0000 g | INTRAVENOUS | Status: AC
  Administered 2022-06-08: 2 g via INTRAVENOUS
  Filled 2022-06-08: qty 100

## 2022-06-08 MED ORDER — OXYCODONE HCL 5 MG PO TABS: 5.0000 mg | ORAL_TABLET | Freq: Once | ORAL | Status: DC | PRN

## 2022-06-08 MED ORDER — LEVOTHYROXINE SODIUM 50 MCG PO TABS
50.0000 ug | ORAL_TABLET | Freq: Every day | ORAL | Status: DC
  Administered 2022-06-09: 50 ug via ORAL
  Filled 2022-06-08: qty 1

## 2022-06-08 MED ORDER — BISACODYL 10 MG RE SUPP: 10.0000 mg | Freq: Every day | RECTAL | Status: DC | PRN

## 2022-06-08 MED ORDER — ORAL CARE MOUTH RINSE: 15.0000 mL | Freq: Once | OROMUCOSAL | Status: AC

## 2022-06-08 MED ORDER — ASPIRIN 81 MG PO CHEW
81.0000 mg | CHEWABLE_TABLET | Freq: Two times a day (BID) | ORAL | Status: DC
  Administered 2022-06-08 – 2022-06-09 (×2): 81 mg via ORAL
  Filled 2022-06-08 (×2): qty 1

## 2022-06-08 MED ORDER — POLYETHYLENE GLYCOL 3350 17 G PO PACK: 17.0000 g | PACK | Freq: Every day | ORAL | Status: DC | PRN

## 2022-06-08 MED ORDER — MORPHINE SULFATE (PF) 2 MG/ML IV SOLN: 0.5000 mg | INTRAVENOUS | Status: DC | PRN

## 2022-06-08 MED ORDER — METOCLOPRAMIDE HCL 5 MG PO TABS: 5.0000 mg | ORAL_TABLET | Freq: Three times a day (TID) | ORAL | Status: DC | PRN

## 2022-06-08 MED ORDER — BUPIVACAINE-EPINEPHRINE (PF) 0.5% -1:200000 IJ SOLN
INTRAMUSCULAR | Status: DC | PRN
  Administered 2022-06-08: 30 mL

## 2022-06-08 MED ORDER — PHENOL 1.4 % MT LIQD: 1.0000 | OROMUCOSAL | Status: DC | PRN

## 2022-06-08 MED ORDER — ONDANSETRON HCL 4 MG PO TABS: 4.0000 mg | ORAL_TABLET | Freq: Four times a day (QID) | ORAL | Status: DC | PRN

## 2022-06-08 MED ORDER — CLONIDINE HCL (ANALGESIA) 100 MCG/ML EP SOLN
EPIDURAL | Status: DC | PRN
  Administered 2022-06-08: 100 ug

## 2022-06-08 MED ORDER — TRIAMCINOLONE ACETONIDE 0.1 % EX CREA: 1.0000 | TOPICAL_CREAM | Freq: Two times a day (BID) | CUTANEOUS | Status: DC

## 2022-06-08 MED ORDER — PROPOFOL 500 MG/50ML IV EMUL
INTRAVENOUS | Status: DC | PRN
  Administered 2022-06-08: 50 ug/kg/min via INTRAVENOUS

## 2022-06-08 MED ORDER — NITROGLYCERIN 0.4 MG SL SUBL: 0.4000 mg | SUBLINGUAL_TABLET | SUBLINGUAL | Status: DC | PRN

## 2022-06-08 MED ORDER — BUPIVACAINE-EPINEPHRINE (PF) 0.5% -1:200000 IJ SOLN
INTRAMUSCULAR | Status: DC | PRN
  Administered 2022-06-08: 15 mL via PERINEURAL

## 2022-06-08 MED ORDER — LACTATED RINGERS IV SOLN: INTRAVENOUS | Status: DC

## 2022-06-08 MED ORDER — NABUMETONE 500 MG PO TABS: 750.0000 mg | ORAL_TABLET | Freq: Two times a day (BID) | ORAL | Status: DC

## 2022-06-08 MED ORDER — BUPIVACAINE-EPINEPHRINE (PF) 0.5% -1:200000 IJ SOLN
INTRAMUSCULAR | Status: AC
  Filled 2022-06-08: qty 30

## 2022-06-08 MED ORDER — ONDANSETRON HCL 4 MG/2ML IJ SOLN: 4.0000 mg | Freq: Four times a day (QID) | INTRAMUSCULAR | Status: DC | PRN

## 2022-06-08 MED ORDER — TORSEMIDE 20 MG PO TABS: 20.0000 mg | ORAL_TABLET | ORAL | Status: DC

## 2022-06-08 MED ORDER — BUPIVACAINE LIPOSOME 1.3 % IJ SUSP: 20.0000 mL | Freq: Once | INTRAMUSCULAR | Status: DC

## 2022-06-08 MED ORDER — ASPIRIN 81 MG PO TABS: 81.0000 mg | ORAL_TABLET | Freq: Two times a day (BID) | ORAL | 0 refills | Status: AC

## 2022-06-08 MED ORDER — TRAMADOL HCL 50 MG PO TABS: 50.0000 mg | ORAL_TABLET | Freq: Two times a day (BID) | ORAL | Status: DC | PRN

## 2022-06-08 MED ORDER — SODIUM CHLORIDE (PF) 0.9 % IJ SOLN
INTRAMUSCULAR | Status: AC
  Filled 2022-06-08: qty 30

## 2022-06-08 MED ORDER — FESOTERODINE FUMARATE ER 4 MG PO TB24
4.0000 mg | ORAL_TABLET | Freq: Every day | ORAL | Status: DC
  Administered 2022-06-09: 4 mg via ORAL
  Filled 2022-06-08: qty 1

## 2022-06-08 MED ORDER — EPHEDRINE SULFATE (PRESSORS) 50 MG/ML IJ SOLN
INTRAMUSCULAR | Status: DC | PRN
  Administered 2022-06-08: 5 mg via INTRAVENOUS
  Administered 2022-06-08: 10 mg via INTRAVENOUS

## 2022-06-08 MED ORDER — OXYCODONE HCL 5 MG/5ML PO SOLN: 5.0000 mg | Freq: Once | ORAL | Status: DC | PRN

## 2022-06-08 MED ORDER — SODIUM CHLORIDE (PF) 0.9 % IJ SOLN
INTRAMUSCULAR | Status: DC | PRN
  Administered 2022-06-08: 30 mL

## 2022-06-08 MED ORDER — CEFAZOLIN SODIUM-DEXTROSE 2-4 GM/100ML-% IV SOLN
2.0000 g | Freq: Four times a day (QID) | INTRAVENOUS | Status: AC
  Administered 2022-06-08 (×2): 2 g via INTRAVENOUS
  Filled 2022-06-08 (×2): qty 100

## 2022-06-08 MED ORDER — WATER FOR IRRIGATION, STERILE IR SOLN
Status: DC | PRN
  Administered 2022-06-08: 2000 mL

## 2022-06-08 MED ORDER — BUPIVACAINE LIPOSOME 1.3 % IJ SUSP
INTRAMUSCULAR | Status: DC | PRN
  Administered 2022-06-08: 20 mL

## 2022-06-08 MED ORDER — TRANEXAMIC ACID-NACL 1000-0.7 MG/100ML-% IV SOLN
1000.0000 mg | INTRAVENOUS | Status: DC
  Filled 2022-06-08: qty 100

## 2022-06-08 MED ORDER — SODIUM CHLORIDE 0.9 % IV SOLN: INTRAVENOUS | Status: DC

## 2022-06-08 MED ORDER — BUPIVACAINE IN DEXTROSE 0.75-8.25 % IT SOLN
INTRATHECAL | Status: DC | PRN
  Administered 2022-06-08: 1.6 mL via INTRATHECAL

## 2022-06-08 MED ORDER — LOSARTAN POTASSIUM 25 MG PO TABS
12.5000 mg | ORAL_TABLET | Freq: Every day | ORAL | Status: DC
  Administered 2022-06-09: 12.5 mg via ORAL
  Filled 2022-06-08: qty 1

## 2022-06-08 MED ORDER — FENTANYL CITRATE (PF) 100 MCG/2ML IJ SOLN
INTRAMUSCULAR | Status: DC | PRN
  Administered 2022-06-08: 100 ug via INTRAVENOUS

## 2022-06-08 MED ORDER — METOCLOPRAMIDE HCL 5 MG/ML IJ SOLN: 5.0000 mg | Freq: Three times a day (TID) | INTRAMUSCULAR | Status: DC | PRN

## 2022-06-08 MED ORDER — CHLORHEXIDINE GLUCONATE 0.12 % MT SOLN
15.0000 mL | Freq: Once | OROMUCOSAL | Status: AC
  Administered 2022-06-08: 15 mL via OROMUCOSAL

## 2022-06-08 MED ORDER — 0.9 % SODIUM CHLORIDE (POUR BTL) OPTIME
TOPICAL | Status: DC | PRN
  Administered 2022-06-08: 1000 mL

## 2022-06-08 MED ORDER — TRAMADOL HCL 50 MG PO TABS: 50.0000 mg | ORAL_TABLET | Freq: Four times a day (QID) | ORAL | 0 refills | Status: DC | PRN

## 2022-06-08 MED ORDER — DEXAMETHASONE SODIUM PHOSPHATE 4 MG/ML IJ SOLN
INTRAMUSCULAR | Status: DC | PRN
  Administered 2022-06-08: 5 mg via INTRAVENOUS

## 2022-06-08 MED ORDER — FENTANYL CITRATE (PF) 100 MCG/2ML IJ SOLN
INTRAMUSCULAR | Status: AC
  Filled 2022-06-08: qty 2

## 2022-06-08 MED ORDER — DUPILUMAB 300 MG/2ML ~~LOC~~ SOSY: 300.0000 mg | PREFILLED_SYRINGE | SUBCUTANEOUS | Status: DC

## 2022-06-08 MED ORDER — POVIDONE-IODINE 10 % EX SWAB
2.0000 | Freq: Once | CUTANEOUS | Status: AC
  Administered 2022-06-08: 2 via TOPICAL

## 2022-06-08 MED ORDER — DOCUSATE SODIUM 100 MG PO CAPS
100.0000 mg | ORAL_CAPSULE | Freq: Two times a day (BID) | ORAL | Status: DC
  Administered 2022-06-08 – 2022-06-09 (×2): 100 mg via ORAL
  Filled 2022-06-08 (×2): qty 1

## 2022-06-08 MED ORDER — ONDANSETRON HCL 4 MG/2ML IJ SOLN
INTRAMUSCULAR | Status: DC | PRN
  Administered 2022-06-08: 4 mg via INTRAVENOUS

## 2022-06-08 MED ORDER — ACETAMINOPHEN 325 MG PO TABS
325.0000 mg | ORAL_TABLET | Freq: Four times a day (QID) | ORAL | Status: DC | PRN
  Administered 2022-06-08 – 2022-06-09 (×4): 650 mg via ORAL
  Filled 2022-06-08 (×4): qty 2

## 2022-06-08 MED ORDER — SIMVASTATIN 20 MG PO TABS
20.0000 mg | ORAL_TABLET | Freq: Every day | ORAL | Status: DC
  Administered 2022-06-08 – 2022-06-09 (×2): 20 mg via ORAL
  Filled 2022-06-08 (×2): qty 1

## 2022-06-08 MED ORDER — TRANEXAMIC ACID-NACL 1000-0.7 MG/100ML-% IV SOLN
1000.0000 mg | Freq: Once | INTRAVENOUS | Status: AC
  Administered 2022-06-08: 1000 mg via INTRAVENOUS
  Filled 2022-06-08: qty 100

## 2022-06-08 MED ORDER — SODIUM CHLORIDE 0.9 % IR SOLN
Status: DC | PRN
  Administered 2022-06-08: 1000 mL

## 2022-06-08 MED ORDER — MENTHOL 3 MG MT LOZG: 1.0000 | LOZENGE | OROMUCOSAL | Status: DC | PRN

## 2022-06-08 MED ORDER — BUPIVACAINE LIPOSOME 1.3 % IJ SUSP
INTRAMUSCULAR | Status: AC
Start: 2022-06-08 — End: ?
  Filled 2022-06-08: qty 20

## 2022-06-08 SURGICAL SUPPLY — 56 items
ATTUNE MED DOME PAT 38 KNEE (Knees) IMPLANT
ATTUNE PS FEM LT SZ 6 CEM KNEE (Femur) IMPLANT
BAG COUNTER SPONGE SURGICOUNT (BAG) IMPLANT
BAG SPEC THK2 15X12 ZIP CLS (MISCELLANEOUS) ×1
BAG SPNG CNTER NS LX DISP (BAG)
BAG ZIPLOCK 12X15 (MISCELLANEOUS) IMPLANT
BASE TIBIAL ROT PLAT SZ 7 KNEE (Knees) IMPLANT
BLADE SAG 18X100X1.27 (BLADE) ×1 IMPLANT
BLADE SAW SGTL 13X75X1.27 (BLADE) ×1 IMPLANT
BNDG CMPR MED 10X6 ELC LF (GAUZE/BANDAGES/DRESSINGS) ×1
BNDG ELASTIC 6X10 VLCR STRL LF (GAUZE/BANDAGES/DRESSINGS) ×1 IMPLANT
BNDG GAUZE DERMACEA FLUFF (GAUZE/BANDAGES/DRESSINGS) ×1
BNDG GAUZE DERMACEA FLUFF 4 (GAUZE/BANDAGES/DRESSINGS) ×1 IMPLANT
BNDG GZE DERMACEA 4 6PLY (GAUZE/BANDAGES/DRESSINGS) ×1
BOWL SMART MIX CTS (DISPOSABLE) ×1 IMPLANT
BSPLAT TIB 7 CMNT ROT PLAT STR (Knees) ×1 IMPLANT
CEMENT HV SMART SET (Cement) ×2 IMPLANT
COVER SURGICAL LIGHT HANDLE (MISCELLANEOUS) ×1 IMPLANT
CUFF TOURN SGL QUICK 34 (TOURNIQUET CUFF) ×1
CUFF TRNQT CYL 34X4.125X (TOURNIQUET CUFF) ×1 IMPLANT
DRAPE INCISE IOBAN 66X45 STRL (DRAPES) ×1 IMPLANT
DRAPE SHEET LG 3/4 BI-LAMINATE (DRAPES) ×1 IMPLANT
DRAPE U-SHAPE 47X51 STRL (DRAPES) ×1 IMPLANT
DRSG ADAPTIC 3X8 NADH LF (GAUZE/BANDAGES/DRESSINGS) ×1 IMPLANT
DRSG PAD ABDOMINAL 8X10 ST (GAUZE/BANDAGES/DRESSINGS) ×1 IMPLANT
DURAPREP 26ML APPLICATOR (WOUND CARE) ×1 IMPLANT
ELECT REM PT RETURN 15FT ADLT (MISCELLANEOUS) ×1 IMPLANT
GAUZE SPONGE 4X4 12PLY STRL (GAUZE/BANDAGES/DRESSINGS) ×1 IMPLANT
GLOVE BIOGEL PI IND STRL 7.5 (GLOVE) ×1 IMPLANT
GLOVE BIOGEL PI IND STRL 8.5 (GLOVE) ×1 IMPLANT
GLOVE BIOGEL PI INDICATOR 7.5 (GLOVE) ×1
GLOVE BIOGEL PI INDICATOR 8.5 (GLOVE) ×1
GLOVE ORTHO TXT STRL SZ7.5 (GLOVE) ×1 IMPLANT
GLOVE SURG ORTHO 8.5 STRL (GLOVE) ×2 IMPLANT
GOWN STRL REUS W/ TWL XL LVL3 (GOWN DISPOSABLE) ×2 IMPLANT
GOWN STRL REUS W/TWL XL LVL3 (GOWN DISPOSABLE) ×2
HANDPIECE INTERPULSE COAX TIP (DISPOSABLE) ×1
HOLDER FOLEY CATH W/STRAP (MISCELLANEOUS) IMPLANT
IMMOBILIZER KNEE 20 (SOFTGOODS)
IMMOBILIZER KNEE 20 THIGH 36 (SOFTGOODS) IMPLANT
INSERT KNEE ATTUNE SZ6 14MM (Insert) IMPLANT
KIT TURNOVER KIT A (KITS) IMPLANT
MANIFOLD NEPTUNE II (INSTRUMENTS) ×1 IMPLANT
NS IRRIG 1000ML POUR BTL (IV SOLUTION) ×1 IMPLANT
PACK TOTAL KNEE CUSTOM (KITS) ×1 IMPLANT
PROTECTOR NERVE ULNAR (MISCELLANEOUS) ×1 IMPLANT
SET HNDPC FAN SPRY TIP SCT (DISPOSABLE) ×1 IMPLANT
STAPLER VISISTAT 35W (STAPLE) IMPLANT
STRIP CLOSURE SKIN 1/2X4 (GAUZE/BANDAGES/DRESSINGS) ×2 IMPLANT
SUT MNCRL AB 3-0 PS2 18 (SUTURE) ×1 IMPLANT
SUT VIC AB 0 CT1 36 (SUTURE) ×1 IMPLANT
SUT VIC AB 1 CT1 36 (SUTURE) ×3 IMPLANT
SUT VIC AB 2-0 CT1 27 (SUTURE) ×3
SUT VIC AB 2-0 CT1 TAPERPNT 27 (SUTURE) ×1 IMPLANT
TIBIAL BASE ROT PLAT SZ 7 KNEE (Knees) ×1 IMPLANT
WATER STERILE IRR 1000ML POUR (IV SOLUTION) ×2 IMPLANT

## 2022-06-08 NOTE — Interval H&P Note (Signed)
History and Physical Interval Note:  06/08/2022 7:23 AM  Samuel [name]  has presented today for surgery, with the diagnosis of left knee end stage osteoarthritis.  The various methods of treatment have been discussed with the patient and family. After consideration of risks, benefits and other options for treatment, the patient has consented to  Procedure(s): LEFT TOTAL KNEE ARTHROPLASTY (Left) as a surgical intervention.  The patient's history has been reviewed, patient examined, no change in status, stable for surgery.  I have reviewed the patient's chart and labs.  Questions were answered to the patient's satisfaction.     Augustin Schooling

## 2022-06-08 NOTE — Anesthesia Procedure Notes (Signed)
Anesthesia Regional Block: Adductor canal block   Pre-Anesthetic Checklist: , timeout performed,  Correct Patient, Correct Site, Correct Laterality,  Correct Procedure, Correct Position, site marked,  Risks and benefits discussed,  Pre-op evaluation,  At surgeon's request and post-op pain management  Laterality: Left  Prep: Maximum Sterile Barrier Precautions used, chloraprep       Needles:  Injection technique: Single-shot  Needle Type: Echogenic Stimulator Needle     Needle Length: 9cm  Needle Gauge: 22     Additional Needles:   Procedures:,,,, ultrasound used (permanent image in chart),,    Narrative:  Start time: 06/08/2022 7:11 AM End time: 06/08/2022 7:14 AM Injection made incrementally with aspirations every 5 mL.  Performed by: Personally  Anesthesiologist: Brennan Bailey, MD  Additional Notes: Risks, benefits, and alternative discussed. Patient gave consent for procedure. Patient prepped and draped in sterile fashion. Sedation administered, patient remains easily responsive to voice. Relevant anatomy identified with ultrasound guidance. Local anesthetic given in 5cc increments with no signs or symptoms of intravascular injection. No pain or paraesthesias with injection. Patient monitored throughout procedure with signs of LAST or immediate complications. Tolerated well. Ultrasound image placed in chart.  Tawny Asal, MD

## 2022-06-08 NOTE — NC FL2 (Signed)
[place] LEVEL OF CARE SCREENING TOOL     IDENTIFICATION  Patient Name: Samuel [name] Birthdate: 11/22/1931 Sex: male Admission Date (Current Location): 06/08/2022  [place] [facility] [place] Number:  Herbalist and Address:  [facility],  [place], [place]      Provider Number: [id number]  Attending Physician Name and Address:  Netta Cedars, MD  Relative Name and Phone Number:       Current Level of Care: Hospital Recommended Level of Care: [place] Prior Approval Number:    Date Approved/Denied:   PASRR Number:    Discharge Plan: SNF    Current Diagnoses: Patient Active Problem List   Diagnosis Date Noted   H/O total knee replacement, left 06/08/2022   History of reverse total replacement of right shoulder joint 09/14/2020   Atopic dermatitis 06/25/2019   Chronic aorto-iliac occlusion syndrome ([place]) 05/07/2018   Caregiver stress 04/17/2017   Allergic drug reaction 04/17/2017   S/P minimally invasive mitral valve repair 01/04/2016   Paroxysmal atrial fibrillation ([place]) 01/04/2016   Carotid artery disease ([place])    Hypothyroidism 08/27/2011   Hyperlipidemia LDL goal <100 06/21/2010   Essential hypertension, benign 06/21/2010   Contracture of palmar fascia 06/21/2010   Enthesopathy of hip region 11/27/2009    Orientation RESPIRATION BLADDER Height & Weight     Self, Time, Situation, Place  Normal Continent Weight: 158 lb 1.1 oz (71.7 kg) Height:  '5\' 7"'$  (170.2 cm)  BEHAVIORAL SYMPTOMS/MOOD NEUROLOGICAL BOWEL NUTRITION STATUS      Continent Diet (regular)  AMBULATORY STATUS COMMUNICATION OF NEEDS Skin   Limited Assist Verbally Other (Comment) (surgical incision only)                       Personal Care Assistance Level of Assistance  Bathing, Dressing Bathing Assistance: Limited assistance   Dressing Assistance: Limited assistance     Functional Limitations Info              [place]  PT (By licensed PT), OT (By licensed OT)     PT Frequency: 5x/wk OT Frequency: 5x/wk            Contractures Contractures Info: Not present    Additional Factors Info  Code Status, Allergies Code Status Info: Full Allergies Info: NKDA           Current Medications (06/08/2022):  This is the current hospital active medication list Current Facility-Administered Medications  Medication Dose Route Frequency Provider Last Rate Last Admin   0.9 %  sodium chloride infusion   Intravenous Continuous Netta Cedars, MD 50 mL/hr at 06/08/22 1228 New Bag at 06/08/22 1228   acetaminophen (TYLENOL) tablet 325-650 mg  325-650 mg Oral Q6H PRN Netta Cedars, MD   650 mg at 06/08/22 1225   [START ON 06/09/2022] alfuzosin (UROXATRAL) 24 hr tablet 10 mg  10 mg Oral Q breakfast Netta Cedars, MD       aspirin chewable tablet 81 mg  81 mg Oral BID Netta Cedars, MD       bisacodyl (DULCOLAX) suppository 10 mg  10 mg Rectal Daily PRN Netta Cedars, MD       ceFAZolin (ANCEF) IVPB 2g/100 mL premix  2 g Intravenous Q6H Netta Cedars, MD       docusate sodium (COLACE) capsule 100 mg  100 mg Oral BID Netta Cedars, MD       [START ON 06/21/2022] dupilumab ([place]) prefilled  syringe 300 mg  300 mg Subcutaneous Q14 Days Netta Cedars, MD       [START ON 06/09/2022] fesoterodine (TOVIAZ) tablet 4 mg  4 mg Oral Daily Netta Cedars, MD       Derrill Memo ON 06/09/2022] levothyroxine (SYNTHROID) tablet 50 mcg  50 mcg Oral QAC breakfast Netta Cedars, MD       losartan (COZAAR) tablet 12.5 mg  12.5 mg Oral Daily Netta Cedars, MD       menthol-cetylpyridinium (CEPACOL) lozenge 3 mg  1 lozenge Oral PRN Netta Cedars, MD       Or   phenol (CHLORASEPTIC) mouth spray 1 spray  1 spray Mouth/Throat PRN Netta Cedars, MD       metoCLOPramide (REGLAN) tablet 5-10 mg  5-10 mg Oral Q8H PRN Netta Cedars, MD       Or   metoCLOPramide (REGLAN) injection 5-10 mg  5-10 mg Intravenous Q8H PRN  Netta Cedars, MD       morphine (PF) 2 MG/ML injection 0.5-1 mg  0.5-1 mg Intravenous Q2H PRN Netta Cedars, MD       nitroGLYCERIN (NITROSTAT) SL tablet 0.4 mg  0.4 mg Sublingual Q5 min PRN Netta Cedars, MD       ondansetron [facility]) tablet 4 mg  4 mg Oral Q6H PRN Netta Cedars, MD       Or   ondansetron [facility]) injection 4 mg  4 mg Intravenous Q6H PRN Netta Cedars, MD       polyethylene glycol (MIRALAX / GLYCOLAX) packet 17 g  17 g Oral Daily PRN Netta Cedars, MD       simvastatin (ZOCOR) tablet 20 mg  20 mg Oral Daily Netta Cedars, MD   20 mg at 06/08/22 1225   [START ON 06/11/2022] torsemide (DEMADEX) tablet 20 mg  20 mg Oral Once per day on Mon Wed Fri Norris, Steve, MD       traMADol Veatrice Bourbon) tablet 50 mg  50 mg Oral Q12H PRN Netta Cedars, MD         Discharge Medications: Please see discharge summary for a list of discharge medications.  Relevant Imaging Results:  Relevant Lab Results:   Additional Information SSN: Pe[place], LCSW

## 2022-06-08 NOTE — Evaluation (Signed)
Physical Therapy Evaluation Patient Details Name: Samuel [name] MRN: [id number] DOB: 1932-04-08 Today's Date: 06/08/2022  History of Present Illness  Pt s/p L TKR and with hx of reverse R Reverse TSR, CAD, and mitral valve repair  Clinical Impression  Pt s/p L TKR and presents with decreased L LE strength/ROM and post op pain limiting functional mobility.  Pt plans dc to rehab setting at [facility] to maximize IND and safety prior to return home with limited assist.     Recommendations for follow up therapy are one component of a multi-disciplinary discharge planning process, led by the attending physician.  Recommendations may be updated based on patient status, additional functional criteria and insurance authorization.  Follow Up Recommendations Skilled nursing-short term rehab (<3 hours/day) Can patient physically be transported by private vehicle: Yes    Assistance Recommended at Discharge Frequent or constant Supervision/Assistance  Patient can return home with the following  A little help with walking and/or transfers;A little help with bathing/dressing/bathroom;Assistance with cooking/housework;Assist for transportation;Help with stairs or ramp for entrance    Equipment Recommendations None recommended by PT  Recommendations for Other Services       Functional Status Assessment Patient has had a recent decline in their functional status and demonstrates the ability to make significant improvements in function in a reasonable and predictable amount of time.     Precautions / Restrictions Precautions Precautions: Fall Restrictions Weight Bearing Restrictions: No Other Position/Activity Restrictions: WBAT      Mobility  Bed Mobility Overal bed mobility: Needs Assistance Bed Mobility: Supine to Sit     Supine to sit: Min assist     General bed mobility comments: Increased time with cues for sequence and use of R LE to self assist    Transfers Overall transfer  level: Needs assistance Equipment used: Rolling walker (2 wheels) Transfers: Sit to/from Stand Sit to Stand: From elevated surface, Min assist, Mod assist           General transfer comment: cues for LE management and use of UEs to self assist    Ambulation/Gait Ambulation/Gait assistance: Min assist Gait Distance (Feet): 58 Feet Assistive device: Rolling walker (2 wheels) Gait Pattern/deviations: Step-to pattern, Decreased step length - right, Decreased step length - left, Shuffle, Trunk flexed Gait velocity: decr     General Gait Details: cues for sequence, posture and position from [employer]            Wheelchair Mobility    Modified Rankin (Stroke Patients Only)       Balance Overall balance assessment: Needs assistance Sitting-balance support: Feet supported, No upper extremity supported Sitting balance-Leahy Scale: Fair     Standing balance support: Bilateral upper extremity supported Standing balance-Leahy Scale: Poor                               Pertinent Vitals/Pain Pain Assessment Pain Assessment: 0-10 Pain Score: 2  Pain Location: L knee Pain Descriptors / Indicators: Aching, Sore Pain Intervention(s): Limited activity within patient's tolerance, Monitored during session, Premedicated before session, Ice applied    Home Living Family/patient expects to be discharged to:: Skilled nursing facility Living Arrangements: Alone                      Prior Function Prior Level of Function : Independent/Modified Independent  Hand Dominance        Extremity/Trunk Assessment   Upper Extremity Assessment Upper Extremity Assessment: Overall WFL for tasks assessed    Lower Extremity Assessment Lower Extremity Assessment: LLE deficits/detail    Cervical / Trunk Assessment Cervical / Trunk Assessment: Normal  Communication   Communication: No difficulties  Cognition Arousal/Alertness:  Awake/alert Behavior During Therapy: WFL for tasks assessed/performed Overall Cognitive Status: Within Functional Limits for tasks assessed                                          General Comments      Exercises Total Joint Exercises Ankle Circles/Pumps: AROM, Both, 15 reps, Supine   Assessment/Plan    PT Assessment Patient needs continued PT services  PT Problem List Decreased strength;Decreased range of motion;Decreased activity tolerance;Decreased balance;Decreased mobility;Decreased knowledge of use of DME;Pain       PT Treatment Interventions DME instruction;Gait training;Stair training;Functional mobility training;Therapeutic activities;Therapeutic exercise;Patient/family education    PT Goals (Current goals can be found in the Care Plan section)  Acute Rehab PT Goals Patient Stated Goal: Rehab at [facility] and home PT Goal Formulation: With patient Time For Goal Achievement: 06/15/22 Potential to Achieve Goals: Good    Frequency 7X/week     Co-evaluation               AM-PAC PT "6 Clicks" Mobility  Outcome Measure Help needed turning from your back to your side while in a flat bed without using bedrails?: A Lot Help needed moving from lying on your back to sitting on the side of a flat bed without using bedrails?: A Lot Help needed moving to and from a bed to a chair (including a wheelchair)?: A Little Help needed standing up from a chair using your arms (e.g., wheelchair or bedside chair)?: A Little Help needed to walk in hospital room?: A Little Help needed climbing 3-5 steps with a railing? : A Lot 6 Click Score: 15    End of Session Equipment Utilized During Treatment: Gait belt;Left knee immobilizer Activity Tolerance: Patient tolerated treatment well Patient left: in chair;with call bell/phone within reach;with family/visitor present Nurse Communication: Mobility status PT Visit Diagnosis: Difficulty in walking, not elsewhere  classified (R26.2)    Time: 1458-1530 PT Time Calculation (min) (ACUTE ONLY): 32 min   Charges:   PT Evaluation $PT Eval Low Complexity: 1 Low PT Treatments $Gait Training: 8-22 mins        Debe Coder PT Acute Rehabilitation Services Pager [phone] Office [phone]   Foy Vanduyne 06/08/2022, 5:41 PM

## 2022-06-08 NOTE — Transfer of Care (Signed)
Immediate Anesthesia Transfer of Care Note  Patient: Samuel [name]  Procedure(s) Performed: LEFT TOTAL KNEE ARTHROPLASTY (Left: Knee)  Patient Location: PACU  Anesthesia Type:Spinal  Level of Consciousness: awake, alert  and oriented  Airway & Oxygen Therapy: Patient Spontanous Breathing and Patient connected to face mask oxygen  Post-op Assessment: Report given to RN and Post -op Vital signs reviewed and stable  Post vital signs: Reviewed and stable  Last Vitals:  Vitals Value Taken Time  BP 88/53 06/08/22 0916  Temp    Pulse 69 06/08/22 0918  Resp 12 06/08/22 0918  SpO2 99 % 06/08/22 0918  Vitals shown include unvalidated device data.  Last Pain:  Vitals:   06/08/22 0556  TempSrc: Oral  PainSc:          Complications: No notable events documented.

## 2022-06-08 NOTE — Anesthesia Postprocedure Evaluation (Signed)
Anesthesia Post Note  Patient: Samuel [name]  Procedure(s) Performed: LEFT TOTAL KNEE ARTHROPLASTY (Left: Knee)     Patient location during evaluation: PACU Anesthesia Type: Spinal Level of consciousness: awake and alert Pain management: pain level controlled Vital Signs Assessment: post-procedure vital signs reviewed and stable Respiratory status: spontaneous breathing, nonlabored ventilation and respiratory function stable Cardiovascular status: blood pressure returned to baseline Postop Assessment: no apparent nausea or vomiting, spinal receding, no headache and no backache Anesthetic complications: no   No notable events documented.  Last Vitals:  Vitals:   06/08/22 1000 06/08/22 1015  BP: 123/71 130/75  Pulse: (!) 53 (!) 52  Resp: 12 15  Temp:    SpO2: 93% 97%    Last Pain:  Vitals:   06/08/22 0945  TempSrc:   PainSc: 0-No pain                 Marthenia Rolling

## 2022-06-08 NOTE — Brief Op Note (Signed)
06/08/2022  9:02 AM  PATIENT:  Samuel [name]  88 y.o. male  PRE-OPERATIVE DIAGNOSIS:  left knee end stage osteoarthritis  POST-OPERATIVE DIAGNOSIS:  left knee end stage osteoarthritis  PROCEDURE:  Procedure(s): LEFT TOTAL KNEE ARTHROPLASTY (Left) DePuy Attune  SURGEON:  Surgeon(s) and Role:    Netta Cedars, MD - Primary  PHYSICIAN ASSISTANT:   ASSISTANTS: Ventura Bruns, PA-C   ANESTHESIA:   regional and spinal  EBL:  25 mL   BLOOD ADMINISTERED:none  DRAINS: none   LOCAL MEDICATIONS USED:  MARCAINE     SPECIMEN:  No Specimen  DISPOSITION OF SPECIMEN:  N/A  COUNTS:  YES  TOURNIQUET:  * Missing tourniquet times found for documented tourniquets in log: [id number] *  DICTATION: .Other Dictation: Dictation Number [id number]  PLAN OF CARE: Admit for overnight observation  PATIENT DISPOSITION:  PACU - hemodynamically stable.   Delay start of Pharmacological VTE agent (>24hrs) due to surgical blood loss or risk of bleeding: no

## 2022-06-08 NOTE — Anesthesia Procedure Notes (Signed)
Spinal  Patient location during procedure: OR Start time: 06/08/2022 7:34 AM End time: 06/08/2022 7:37 AM Reason for block: surgical anesthesia Staffing Performed: anesthesiologist  Anesthesiologist: Brennan Bailey, MD Performed by: Brennan Bailey, MD Authorized by: Brennan Bailey, MD   Preanesthetic Checklist Completed: patient identified, IV checked, risks and benefits discussed, surgical consent, monitors and equipment checked, pre-op evaluation and timeout performed Spinal Block Patient position: sitting Prep: DuraPrep and site prepped and draped Patient monitoring: continuous pulse ox, blood pressure and heart rate Approach: midline Location: L3-4 Injection technique: single-shot Needle Needle type: Pencan  Needle gauge: 24 G Needle length: 9 cm Assessment Events: CSF return Additional Notes Risks, benefits, and alternative discussed. Patient gave consent to procedure. Prepped and draped in sitting position. Patient sedated but responsive to voice. Clear CSF obtained after one needle pass. Positive terminal aspiration. No pain or paraesthesias with injection. Patient tolerated procedure well. Vital signs stable. Tawny Asal, MD

## 2022-06-08 NOTE — Op Note (Signed)
NAME: DHANUSH, JOKERST MEDICAL RECORD NO: [id number] ACCOUNT NO: [id number] DATE OF BIRTH: 01-13-1932 FACILITY: [facility] LOCATION: [facility] PHYSICIAN: Doran Heater. Veverly Fells, MD  Operative Report   DATE OF PROCEDURE: 06/08/2022  PREOPERATIVE DIAGNOSIS:  Left knee end-stage arthritis.  POSTOPERATIVE DIAGNOSIS:  Left knee end-stage arthritis.  PROCEDURE PERFORMED:  Left total knee arthroplasty using DePuy Attune prosthesis.  ATTENDING SURGEON:  Doran Heater. Veverly Fells, MD  ASSISTANT:  Darol Destine, MD, PA-C, who was scrubbed during the entire procedure and necessary for satisfactory completion of surgery.  ANESTHESIA:  Spinal anesthesia plus adductor canal block was used.  ESTIMATED BLOOD LOSS:  Minimal.  FLUID REPLACEMENT:  1200 mL crystalloid.  INSTRUMENTS COUNTS:  Correct.  COMPLICATIONS:  None.  ANTIBIOTICS:  Perioperative antibiotics were given.  TOURNIQUET TIME:  1 hour and 10 minutes at 300 mmHg.  INDICATIONS:  The patient is an 88 year old male who presents with a history of worsening left knee pain due to end-stage arthritis, bone-on-bone.  The patient has noticed a marked decline in his function and marked increase in pain recently due to his  arthritis.  The patient has failed all measures of conservative management, presents for operative treatment to restore function and to eliminate pain.  Informed consent obtained.  DESCRIPTION OF PROCEDURE:  After an adequate level spinal anesthesia was achieved, an adductor canal block was placed.  The patient was placed in supine on the operating table.  A nonsterile tourniquet placed on the left proximal thigh.  Left leg  sterilely prepped and draped in the usual manner.  Timeout called, verifying correct patient, correct site. We elevated the leg and exsanguinated with an Esmarch bandage and inflating the tourniquet to 300 mmHg. We placed the knee in flexion and  performed a longitudinal midline incision with a 10 blade scalpel.   Dissection down through subcutaneous tissues.  We identified the parapatellar tissues and performed a parapatellar arthrotomy on the medial side with a fresh 10 blade.  We everted the  patella, divided lateral patellofemoral ligaments.  We noted there to be no cartilage on the distal femur and severe wear on the patella and on the tibia as well.  We entered the distal femur with a step cut drill and then placed our intramedullary guide  for the femoral resection.  We resected 5 degrees valgus, 10 mm for a flexion contracture.  We then sized the femur to a size 6 anterior down. Performed anterior, posterior and chamfer cuts with the 4-in-1 block.  We next removed ACL and PCL meniscal  tissue, subluxing the tibia anteriorly and then performing our tibial cut with the external jig, resecting 2 mm off the affected medial side with minimal posterior slope for the posterior cruciate substituting prosthesis.  Once we had that tibial cut  done, we used a lamina spreader and removed excess posterior femoral condyle osteophytes with a 1-inch curved osteotome.  We then injected the posterior capsule with combination of Marcaine, Exparel and saline.  Next, we went ahead and checked our  flexion and extension gaps, which were symmetric at 10 mm.  We then went ahead and completed our tibial preparation with the modular drill and keel punch for the 7 tibia and then we completed our femoral preparation cutting for the box with the  oscillating saw.  Once we placed our 6 left femur in place, we trialled initially with a 10 mm spacer and then went to a 12.  Next, we resurfaced the patella measuring 27 mm thickness and then cutting  down to 17 mm thickness.  We then drilled lug holes  for the 38 patellar button.  We then trialed the patella and ranged it through a full range of motion. We had excellent patellar tracking with no-touch technique.  We removed all trial components, pulse irrigated and vacuum mixed high viscosity  cement on  the back table.  Once we dried the bone well, we cemented the components into place; tibia, femur and patella all in one step with the 12 spacer while the cement hardened.  We then after the cement was hardened, removed all excess cement with 1/4-inch  curved osteotome.  We injected the combination of Marcaine, saline and Exparel in the anterior capsular tissues and then we selected a real size 6, 14 mm spacer and placed that on the tibia and reduced the knee.  We had a nice stable knee throughout a  full range of motion, excellent stability both in flexion and extension.  We irrigated thoroughly and then closed the parapatellar arthrotomy with #1 Vicryl suture, followed by 2-0 Vicryl subcutaneous closure and 4-0 Monocryl for skin.  Steri-Strips were  applied followed by sterile dressing.  The patient tolerated surgery well.   VAI D: 06/08/2022 9:07:30 am T: 06/08/2022 9:16:00 am  JOB: [id number]/ [id number]

## 2022-06-08 NOTE — Discharge Instructions (Signed)
Ice to the knee constantly.  Keep the incision covered and clean and dry for one week, then ok to get it wet in the shower.  Do exercise as instructed every hour, please to prevent stiffness.    DO NOT prop anything under the knee, it will make your knee stiff.  Prop under the ankle to encourage your knee to go straight.   Use the walker while you are up and around for balance.  Wear your support stockings 24/7 to prevent blood clots and take baby aspirin twice daily for 30 days also to prevent blood clots  Follow up with Dr Ailani Governale in two weeks in the office, call [phone] for appt   INSTRUCTIONS AFTER JOINT REPLACEMENT   Remove items at home which could result in a fall. This includes throw rugs or furniture in walking pathways ICE to the affected joint every three hours while awake for 30 minutes at a time, for at least the first 3-5 days, and then as needed for pain and swelling.  Continue to use ice for pain and swelling. You may notice swelling that will progress down to the foot and ankle.  This is normal after surgery.  Elevate your leg when you are not up walking on it.   Continue to use the breathing machine you got in the hospital (incentive spirometer) which will help keep your temperature down.  It is common for your temperature to cycle up and down following surgery, especially at night when you are not up moving around and exerting yourself.  The breathing machine keeps your lungs expanded and your temperature down.   DIET:  As you were doing prior to hospitalization, we recommend a well-balanced diet.  DRESSING / WOUND CARE / SHOWERING  You may change your dressing 3-5 days after surgery.  Then change the dressing every day with sterile gauze.  Please use good hand washing techniques before changing the dressing.  Do not use any lotions or creams on the incision until instructed by your surgeon.  ACTIVITY  Increase activity slowly as tolerated, but follow the weight  bearing instructions below.   No driving for 6 weeks or until further direction given by your physician.  You cannot drive while taking narcotics.  No lifting or carrying greater than 10 lbs. until further directed by your surgeon. Avoid periods of inactivity such as sitting longer than an hour when not asleep. This helps prevent blood clots.  You may return to work once you are authorized by your doctor.     WEIGHT BEARING   Weight bearing as tolerated with assist device (walker, cane, etc) as directed, use it as long as suggested by your surgeon or therapist, typically at least 4-6 weeks.   EXERCISES  Results after joint replacement surgery are often greatly improved when you follow the exercise, range of motion and muscle strengthening exercises prescribed by your doctor. Safety measures are also important to protect the joint from further injury. Any time any of these exercises cause you to have increased pain or swelling, decrease what you are doing until you are comfortable again and then slowly increase them. If you have problems or questions, call your caregiver or physical therapist for advice.   Rehabilitation is important following a joint replacement. After just a few days of immobilization, the muscles of the leg can become weakened and shrink (atrophy).  These exercises are designed to build up the tone and strength of the thigh and leg muscles and to   improve motion. Often times heat used for twenty to thirty minutes before working out will loosen up your tissues and help with improving the range of motion but do not use heat for the first two weeks following surgery (sometimes heat can increase post-operative swelling).   These exercises can be done on a training (exercise) mat, on the floor, on a table or on a bed. Use whatever works the best and is most comfortable for you.    Use music or television while you are exercising so that the exercises are a pleasant break in your day.  This will make your life better with the exercises acting as a break in your routine that you can look forward to.   Perform all exercises about fifteen times, three times per day or as directed.  You should exercise both the operative leg and the other leg as well.  Exercises include:   Quad Sets - Tighten up the muscle on the front of the thigh (Quad) and hold for 5-10 seconds.   Straight Leg Raises - With your knee straight (if you were given a brace, keep it on), lift the leg to 60 degrees, hold for 3 seconds, and slowly lower the leg.  Perform this exercise against resistance later as your leg gets stronger.  Leg Slides: Lying on your back, slowly slide your foot toward your buttocks, bending your knee up off the floor (only go as far as is comfortable). Then slowly slide your foot back down until your leg is flat on the floor again.  Angel Wings: Lying on your back spread your legs to the side as far apart as you can without causing discomfort.  Hamstring Strength:  Lying on your back, push your heel against the floor with your leg straight by tightening up the muscles of your buttocks.  Repeat, but this time bend your knee to a comfortable angle, and push your heel against the floor.  You may put a pillow under the heel to make it more comfortable if necessary.   A rehabilitation program following joint replacement surgery can speed recovery and prevent re-injury in the future due to weakened muscles. Contact your doctor or a physical therapist for more information on knee rehabilitation.    CONSTIPATION  Constipation is defined medically as fewer than three stools per week and severe constipation as less than one stool per week.  Even if you have a regular bowel pattern at home, your normal regimen is likely to be disrupted due to multiple reasons following surgery.  Combination of anesthesia, postoperative narcotics, change in appetite and fluid intake all can affect your bowels.   YOU MUST  use at least one of the following options; they are listed in order of increasing strength to get the job done.  They are all available over the counter, and you may need to use some, POSSIBLY even all of these options:    Drink plenty of fluids (prune juice may be helpful) and high fiber foods Colace 100 mg by mouth twice a day  Senokot for constipation as directed and as needed Dulcolax (bisacodyl), take with full glass of water  Miralax (polyethylene glycol) once or twice a day as needed.  If you have tried all these things and are unable to have a bowel movement in the first 3-4 days after surgery call either your surgeon or your primary doctor.    If you experience loose stools or diarrhea, hold the medications until you stool forms back   up.  If your symptoms do not get better within 1 week or if they get worse, check with your doctor.  If you experience "the worst abdominal pain ever" or develop nausea or vomiting, please contact the office immediately for further recommendations for treatment.   ITCHING:  If you experience itching with your medications, try taking only a single pain pill, or even half a pain pill at a time.  You can also use Benadryl over the counter for itching or also to help with sleep.   TED HOSE STOCKINGS:  Use stockings on both legs until for at least 2 weeks or as directed by physician office. They may be removed at night for sleeping.  MEDICATIONS:  See your medication summary on the "After Visit Summary" that nursing will review with you.  You may have some home medications which will be placed on hold until you complete the course of blood thinner medication.  It is important for you to complete the blood thinner medication as prescribed.  PRECAUTIONS:  If you experience chest pain or shortness of breath - call 911 immediately for transfer to the hospital emergency department.   If you develop a fever greater that 101 F, purulent drainage from wound, increased  redness or drainage from wound, foul odor from the wound/dressing, or calf pain - CONTACT YOUR SURGEON.                                                   FOLLOW-UP APPOINTMENTS:  If you do not already have a post-op appointment, please call the office for an appointment to be seen by your surgeon.  Guidelines for how soon to be seen are listed in your "After Visit Summary", but are typically between 1-4 weeks after surgery.  OTHER INSTRUCTIONS:   Knee Replacement:  Do not place pillow under knee, focus on keeping the knee straight while resting. CPM instructions: 0-90 degrees, 2 hours in the morning, 2 hours in the afternoon, and 2 hours in the evening. Place foam block, curve side up under heel at all times except when in CPM or when walking.  DO NOT modify, tear, cut, or change the foam block in any way.  POST-OPERATIVE OPIOID TAPER INSTRUCTIONS: It is important to wean off of your opioid medication as soon as possible. If you do not need pain medication after your surgery it is ok to stop day one. Opioids include: Codeine, Hydrocodone(Norco, Vicodin), Oxycodone(Percocet, oxycontin) and hydromorphone amongst others.  Long term and even short term use of opiods can cause: Increased pain response Dependence Constipation Depression Respiratory depression And more.  Withdrawal symptoms can include Flu like symptoms Nausea, vomiting And more Techniques to manage these symptoms Hydrate well Eat regular healthy meals Stay active Use relaxation techniques(deep breathing, meditating, yoga) Do Not substitute Alcohol to help with tapering If you have been on opioids for less than two weeks and do not have pain than it is ok to stop all together.  Plan to wean off of opioids This plan should start within one week post op of your joint replacement. Maintain the same interval or time between taking each dose and first decrease the dose.  Cut the total daily intake of opioids by one tablet each  day Next start to increase the time between doses. The last dose that should be eliminated is   the evening dose.   MAKE SURE YOU:  Understand these instructions.  Get help right away if you are not doing well or get worse.    Thank you for letting us be a part of your medical care team.  It is a privilege we respect greatly.  We hope these instructions will help you stay on track for a fast and full recovery!      

## 2022-06-08 NOTE — TOC Initial Note (Signed)
Transition of Care [facility]) - Initial/Assessment Note    Patient Details  Name: Samuel [name] MRN: [id number] Date of Birth: Aug 22, 1932  [facility] CM/SW Contact:    Lennart Pall, LCSW Phone Number: 06/08/2022, 2:41 PM  Clinical Narrative:                 Met with pt and daughter today to review dc planning needs.  [place] at [facility] with plans to dc to the SNF rehab area.  I have spoken with Tim Lair at [facility] who confirms SNF bed is ready for pt and they are anticipating admission there tomorrow.  Have alerted MD/ PA here of plans for SNF at [facility] when medically cleared for dc.  Will ask weekend TOC coverage to contact supervisor at [facility] in the morning @ [phone] to confirm discharge and this supervisor will then alert transportation service who will pick up pt for transport to facility.  Expected Discharge Plan: Skilled Nursing Facility Barriers to Discharge: No Barriers Identified   Patient Goals and CMS Choice        Expected Discharge Plan and Services Expected Discharge Plan: [place] Acute Care Choice: [place] Living arrangements for the past 2 months: [place]                 DME Arranged: N/A DME Agency: NA                  Prior Living Arrangements/Services Living arrangements for the past 2 months: [place] Lives with:: Self Patient language and need for interpreter reviewed:: Yes Do you feel safe going back to the place where you live?: Yes      Need for Family Participation in Patient Care: No (Comment) Care giver support system in place?: Yes (comment)   Criminal Activity/Legal Involvement Pertinent to Current Situation/Hospitalization: No - Comment as needed  Activities of Daily Living Home Assistive Devices/Equipment: Eyeglasses ADL Screening (condition at time of admission) Patient's  cognitive ability adequate to safely complete daily activities?: Yes Is the patient deaf or have difficulty hearing?: No Does the patient have difficulty seeing, even when wearing glasses/contacts?: No Does the patient have difficulty concentrating, remembering, or making decisions?: No Patient able to express need for assistance with ADLs?: Yes Does the patient have difficulty dressing or bathing?: No Independently performs ADLs?: Yes (appropriate for developmental age) Does the patient have difficulty walking or climbing stairs?: No Weakness of Legs: None Weakness of Arms/Hands: None  Permission Sought/Granted Permission sought to share information with : Facility Sport and exercise psychologist, Family Supports Permission granted to share information with : Yes, Verbal Permission Granted     Permission granted to share info w AGENCY: contacts at [facility]        Emotional Assessment Appearance:: Appears stated age Attitude/Demeanor/Rapport: Engaged, Gracious Affect (typically observed): Accepting Orientation: : Oriented to Self, Oriented to Place, Oriented to  Time, Oriented to Situation Alcohol / Substance Use: Not Applicable Psych Involvement: No (comment)  Admission diagnosis:  H/O total knee replacement, left [Z96.652] Patient Active Problem List   Diagnosis Date Noted   H/O total knee replacement, left 06/08/2022   History of reverse total replacement of right shoulder joint 09/14/2020   Atopic dermatitis 06/25/2019   Chronic aorto-iliac occlusion syndrome ([place]) 05/07/2018   Caregiver stress 04/17/2017   Allergic drug reaction 04/17/2017   S/P minimally invasive  mitral valve repair 01/04/2016   Paroxysmal atrial fibrillation ([place]) 01/04/2016   Carotid artery disease ([place])    Hypothyroidism 08/27/2011   Hyperlipidemia LDL goal <100 06/21/2010   Essential hypertension, benign 06/21/2010   Contracture of palmar fascia 06/21/2010   Enthesopathy of hip region 11/27/2009    PCP:  Virgie Dad, MD Pharmacy:   [place], [place] [address] [place] [address] Phone: [phone] Fax: [phone]  [id number] [id number]: [phone]: [phone]    Social Determinants of Health (SDOH) Interventions    Readmission Risk Interventions     No data to display

## 2022-06-08 NOTE — Progress Notes (Signed)
Orthopedic Tech Progress Note Patient Details:  Samuel [name] 1931-11-04 [id number]  CPM Left Knee CPM Left Knee: On Left Knee Flexion (Degrees): 90 Left Knee Extension (Degrees): 0  Post Interventions Patient Tolerated: Well Ortho Devices Type of Ortho Device: Bone foam zero knee Ortho Device/Splint Location: Left knee Ortho Device/Splint Interventions: Application   Post Interventions Patient Tolerated: Well  Samuel [name] 06/08/2022, 9:52 AM

## 2022-06-09 DIAGNOSIS — I480 Paroxysmal atrial fibrillation: Secondary | ICD-10-CM | POA: Diagnosis not present

## 2022-06-09 DIAGNOSIS — I2510 Atherosclerotic heart disease of native coronary artery without angina pectoris: Secondary | ICD-10-CM | POA: Diagnosis not present

## 2022-06-09 DIAGNOSIS — I10 Essential (primary) hypertension: Secondary | ICD-10-CM | POA: Diagnosis not present

## 2022-06-09 DIAGNOSIS — M1712 Unilateral primary osteoarthritis, left knee: Secondary | ICD-10-CM | POA: Diagnosis not present

## 2022-06-09 DIAGNOSIS — E039 Hypothyroidism, unspecified: Secondary | ICD-10-CM | POA: Diagnosis not present

## 2022-06-09 DIAGNOSIS — Z96611 Presence of right artificial shoulder joint: Secondary | ICD-10-CM | POA: Diagnosis not present

## 2022-06-09 LAB — CBC
HCT: 30.1 % — ABNORMAL LOW (ref 39.0–52.0)
Hemoglobin: 10.1 g/dL — ABNORMAL LOW (ref 13.0–17.0)
MCH: 31.2 pg (ref 26.0–34.0)
MCHC: 33.6 g/dL (ref 30.0–36.0)
MCV: 92.9 fL (ref 80.0–100.0)
Platelets: 178 10*3/uL (ref 150–400)
RBC: 3.24 MIL/uL — ABNORMAL LOW (ref 4.22–5.81)
RDW: 12.8 % (ref 11.5–15.5)
WBC: 8 10*3/uL (ref 4.0–10.5)
nRBC: 0 % (ref 0.0–0.2)

## 2022-06-09 LAB — BASIC METABOLIC PANEL
Anion gap: 5 (ref 5–15)
BUN: 20 mg/dL (ref 8–23)
CO2: 24 mmol/L (ref 22–32)
Calcium: 8.1 mg/dL — ABNORMAL LOW (ref 8.9–10.3)
Chloride: 108 mmol/L (ref 98–111)
Creatinine, Ser: 1.33 mg/dL — ABNORMAL HIGH (ref 0.61–1.24)
GFR, Estimated: 51 mL/min — ABNORMAL LOW (ref >60)
Glucose, Bld: 108 mg/dL — ABNORMAL HIGH (ref 70–99)
Potassium: 4 mmol/L (ref 3.5–5.1)
Sodium: 137 mmol/L (ref 135–145)

## 2022-06-09 MED ORDER — TRAMADOL HCL 50 MG PO TABS: 50.0000 mg | ORAL_TABLET | Freq: Four times a day (QID) | ORAL | 0 refills | Status: DC | PRN

## 2022-06-09 NOTE — TOC Transition Note (Signed)
Transition of Care [facility]) - CM/SW Discharge Note   Patient Details  Name: Samuel [name] MRN: [id number] Date of Birth: 1932/02/04  Transition of Care [facility]) CM/SW Contact:  Samuel Ludwig, LCSW Phone Number: 06/09/2022, 11:34 AM   Clinical Narrative:     Patient to be d/c'ed today to [facility] room 151.  Patient and family agreeable to plans will transport via [facility] RN to call report to [place].  CSW spoke to patient's daughter Samuel [name] and she is aware that patient is discharging today.     Final next level of care: Skilled Nursing Facility Barriers to Discharge: Barriers Resolved   Patient Goals and CMS Choice Patient states their goals for this hospitalization and ongoing recovery are:: To go to SNF for rehab, then return back home. CMS Medicare.gov Compare Post Acute Care list provided to:: Patient Choice offered to / list presented to : Patient  Discharge Placement              Patient chooses bed at: [facility] Patient to be transferred to facility by: [facility] Name of family member notified: Daughter Samuel [name] Patient and family notified of of transfer: 06/09/22  Discharge Plan and Services     Post Acute Care Choice: [place]          DME Arranged: N/A DME Agency: NA                  Social Determinants of Health (SDOH) Interventions     Readmission Risk Interventions     No data to display

## 2022-06-09 NOTE — Plan of Care (Signed)
  Problem: Education: Goal: Knowledge of the prescribed therapeutic regimen will improve Outcome: Progressing   Problem: Activity: Goal: Ability to avoid complications of mobility impairment will improve Outcome: Progressing   Problem: Pain Management: Goal: Pain level will decrease with appropriate interventions Outcome: Progressing   

## 2022-06-09 NOTE — Discharge Summary (Signed)
Physician Discharge Summary  Patient ID: Samuel [name] MRN: [id number] DOB/AGE: Nov 14, 1931 88 y.o.  Admit date: 06/08/2022 Discharge date: 06/09/2022  Admission Diagnoses: Left knee osteoarthritis Discharge Diagnoses:  Principal Problem:   H/O total knee replacement, left   Discharged Condition: good  Hospital Course: Patient was admitted on 06/08/2022 for a left total knee arthroplasty due to end stage osteoarthritis. He tolerated surgery well and was sent to PACU and postop floor in stable condition. No events overnight. POD 1 patient doing well. Was able to get up with PT. Patient is being discharge to [facility] for a few weeks  Consults: None  Significant Diagnostic Studies: None   Treatments: IV hydration, antibiotics: Ancef, analgesia: acetaminophen and acetaminophen w/ codeine, therapies: PT, and surgery: Left TKA  Discharge Exam: Blood pressure 113/65, pulse 63, temperature 98.4 F (36.9 C), temperature source Oral, resp. rate 18, height '5\' 7"'$  (1.702 m), weight 71.7 kg, SpO2 94 %. General appearance: alert, cooperative, appears stated age, and no distress Extremities: extremities normal, atraumatic, no cyanosis or edema and Homans sign is negative, no sign of DVT Pulses: 2+ and symmetric Skin: Skin color, texture, turgor normal. No rashes or lesions Neurologic: Grossly normal, weakness in left LE Incision/Wound: Dressings C/D/I  Disposition: Discharge disposition: 03-Skilled [name]       Discharge Instructions     Call MD / Call 911   Complete by: As directed    If you experience chest pain or shortness of breath, CALL 911 and be transported to the hospital emergency room.  If you develope a fever above 101 F, pus (white drainage) or increased drainage or redness at the wound, or calf pain, call your surgeon's office.   Constipation Prevention   Complete by: As directed    Drink plenty of fluids.  Prune juice may be helpful.  You may use a stool  softener, such as Colace (over the counter) 100 mg twice a day.  Use MiraLax (over the counter) for constipation as needed.   Diet - low sodium heart healthy   Complete by: As directed    Do not put a pillow under the knee. Place it under the heel.   Complete by: As directed    Increase activity slowly as tolerated   Complete by: As directed    Post-operative opioid taper instructions:   Complete by: As directed    POST-OPERATIVE OPIOID TAPER INSTRUCTIONS: It is important to wean off of your opioid medication as soon as possible. If you do not need pain medication after your surgery it is ok to stop day one. Opioids include: Codeine, Hydrocodone(Norco, Vicodin), Oxycodone(Percocet, oxycontin) and hydromorphone amongst others.  Long term and even short term use of opiods can cause: Increased pain response Dependence Constipation Depression Respiratory depression And more.  Withdrawal symptoms can include Flu like symptoms Nausea, vomiting And more Techniques to manage these symptoms Hydrate well Eat regular healthy meals Stay active Use relaxation techniques(deep breathing, meditating, yoga) Do Not substitute Alcohol to help with tapering If you have been on opioids for less than two weeks and do not have pain than it is ok to stop all together.  Plan to wean off of opioids This plan should start within one week post op of your joint replacement. Maintain the same interval or time between taking each dose and first decrease the dose.  Cut the total daily intake of opioids by one tablet each day Next start to increase the time between doses. The last dose that should  be eliminated is the evening dose.         Allergies as of 06/09/2022   No Known Allergies      Medication List     TAKE these medications    alfuzosin 10 MG 24 hr tablet Commonly known as: UROXATRAL Take 10 mg by mouth daily with breakfast.   amoxicillin 500 MG tablet Commonly known as: AMOXIL Take  4 tablets 1 hour prior to dental procedures. What changed:  how much to take how to take this when to take this   aspirin 81 MG tablet Take 1 tablet (81 mg total) by mouth 2 (two) times daily. for anticoagulation What changed: when to take this   Dupixent 300 MG/2ML prefilled syringe Generic drug: dupilumab Inject 300 mg into the skin every 14 (fourteen) days.   Levothyroxine Sodium 50 MCG Caps Commonly known as: Tirosint TAKE 1 CAPSULE DAILY BEFORE BREAKFAST   losartan 25 MG tablet Commonly known as: COZAAR Take 0.5 tablets (12.5 mg total) by mouth daily.   nabumetone 750 MG tablet Commonly known as: RELAFEN TAKE 1 TABLET TWICE A DAY   nitroGLYCERIN 0.4 MG SL tablet Commonly known as: NITROSTAT Place 1 tablet (0.4 mg total) under the tongue every 5 (five) minutes as needed for chest pain.   simvastatin 40 MG tablet Commonly known as: ZOCOR Take 0.5 tablets (20 mg total) by mouth daily.   torsemide 20 MG tablet Commonly known as: DEMADEX Take 1 tablet (20 mg total) by mouth 3 (three) times a week. What changed: how much to take   traMADol 50 MG tablet Commonly known as: Ultram Take 1 tablet (50 mg total) by mouth every 6 (six) hours as needed for severe pain or moderate pain.   traMADol 50 MG tablet Commonly known as: ULTRAM Take 1 tablet (50 mg total) by mouth every 6 (six) hours as needed for moderate pain or severe pain.   triamcinolone cream 0.1 % Commonly known as: KENALOG Apply 1 application. topically 2 (two) times daily.   VESIcare 5 MG tablet Generic drug: solifenacin Take 5 mg by mouth daily.        Contact information for follow-up providers     Netta Cedars, MD. Call in 2 week(s).   Specialty: Orthopedic Surgery Why: call [phone] for appt Contact information: [address] [place] [address] [phone]              Contact information for after-discharge care     Destination     [facility] .   Service: Skilled Nursing Contact information: [place] [phone]                     Signed: Derrick Ravel [phone] 06/09/2022, 11:13 AM

## 2022-06-09 NOTE — Progress Notes (Signed)
Subjective: 1 Day Post-Op Procedure(s) (LRB): LEFT TOTAL KNEE ARTHROPLASTY (Left) Patient seen in rounds for Dr. Veverly Fells  Patient reports pain as mild.   Denies any nausea vomiting, numbness or tingling   Objective: Vital signs in last 24 hours: Temp:  [96.5 F (35.8 C)-97.8 F (36.6 C)] 97.7 F (36.5 C) (08/19 0447) Pulse Rate:  [52-72] 64 (08/19 0447) Resp:  [12-18] 16 (08/19 0447) BP: (88-130)/(53-83) 126/70 (08/19 0447) SpO2:  [93 %-99 %] 95 % (08/19 0447)  Intake/Output from previous day: 08/18 0701 - 08/19 0700 In: 3094.2 [P.O.:820; I.V.:2074.2; IV Piggyback:200] Out: 0626 [Urine:1550; Blood:25] Intake/Output this shift: No intake/output data recorded.  Recent Labs    06/09/22 0350  HGB 10.1*   Recent Labs    06/09/22 0350  WBC 8.0  RBC 3.24*  HCT 30.1*  PLT 178   Recent Labs    06/09/22 0350  NA 137  K 4.0  CL 108  CO2 24  BUN 20  CREATININE 1.33*  GLUCOSE 108*  CALCIUM 8.1*   No results for input(s): "LABPT", "INR" in the last 72 hours.  Neurologically intact Neurovascular intact Sensation intact distally Intact pulses distally Dorsiflexion/Plantar flexion intact Incision: dressing C/D/I Compartment soft   Assessment/Plan: 1 Day Post-Op Procedure(s) (LRB): LEFT TOTAL KNEE ARTHROPLASTY (Left) Advance diet Up with therapy, pending how therapy goes today hopefully for discharge today Discharge to [facility] DVT prophylaxis will be aspirin 81 mg twice daily New dressing placed for patient today Will follow-up in the office in 2 weeks   Derrick Ravel [phone] 06/09/2022, 8:36 AM

## 2022-06-09 NOTE — TOC Progression Note (Signed)
Transition of Care [facility]) - Progression Note    Patient Details  Name: Lovis More MRN: [id number] Date of Birth: 1932/05/30  [facility] CM[facility]SW Contact  Ross Ludwig, [place] Phone Number: 06/09/2022, 10:00 AM  Clinical Narrative:     CSW spoke to [facility] [phone], and informed her that SNF will need DC summary before patient is able to discharge to SNF.  Per Margarita Mail she will work on it shortly and complete it.  CSW informed her that once it is completed CSW can arrange for transportation for patient.    Expected Discharge Plan: [place] Barriers to Discharge: No Barriers Identified  Expected Discharge Plan and Services Expected Discharge Plan: [place] Choice: [place] Living arrangements for the past 2 months: [place] Expected Discharge Date: 06/09/22               DME Arranged: N/A DME Agency: NA                   Social Determinants of Health (SDOH) Interventions    Readmission Risk Interventions     No data to display

## 2022-06-09 NOTE — Progress Notes (Signed)
Physical Therapy Treatment Patient Details Name: Samuel [name] MRN: [id number] DOB: 23-May-1932 Today's Date: 06/09/2022   History of Present Illness Pt s/p L TKR and with hx of reverse R Reverse TSR, CAD, and mitral valve repair    PT Comments    Pt very motivated and progressing well with mobility.  Pt up to ambulate increased distance in hall and therex program initiated.  Pt pleased with progress and eager for move to rehab at [facility].   Recommendations for follow up therapy are one component of a multi-disciplinary discharge planning process, led by the attending physician.  Recommendations may be updated based on patient status, additional functional criteria and insurance authorization.  Follow Up Recommendations  Skilled nursing-short term rehab (<3 hours/day) Can patient physically be transported by private vehicle: Yes   Assistance Recommended at Discharge Frequent or constant Supervision/Assistance  Patient can return home with the following A little help with walking and/or transfers;A little help with bathing/dressing/bathroom;Assistance with cooking/housework;Assist for transportation;Help with stairs or ramp for entrance   Equipment Recommendations  None recommended by PT    Recommendations for Other Services       Precautions / Restrictions Precautions Precautions: Fall Restrictions Weight Bearing Restrictions: No Other Position/Activity Restrictions: WBAT     Mobility  Bed Mobility               General bed mobility comments: Pt up in chair and requests back to same    Transfers Overall transfer level: Needs assistance Equipment used: Rolling walker (2 wheels) Transfers: Sit to/from Stand Sit to Stand: Min guard           General transfer comment: cues for LE management and use of UEs to self assist    Ambulation/Gait Ambulation/Gait assistance: Min guard Gait Distance (Feet): 120 Feet Assistive device: Rolling walker (2 wheels) Gait  Pattern/deviations: Decreased step length - right, Decreased step length - left, Shuffle, Trunk flexed, Step-to pattern, Step-through pattern Gait velocity: decr     General Gait Details: cues for sequence, posture and position from [employer]             Wheelchair Mobility    Modified Rankin (Stroke Patients Only)       Balance Overall balance assessment: Needs assistance Sitting-balance support: Feet supported, No upper extremity supported Sitting balance-Leahy Scale: Good     Standing balance support: Single extremity supported Standing balance-Leahy Scale: Poor                              Cognition Arousal/Alertness: Awake/alert Behavior During Therapy: WFL for tasks assessed/performed Overall Cognitive Status: Within Functional Limits for tasks assessed                                          Exercises Total Joint Exercises Ankle Circles/Pumps: AROM, Both, 15 reps, Supine Quad Sets: AROM, Both, 10 reps, Supine Heel Slides: AAROM, Left, 15 reps, Supine Hip ABduction/ADduction: AAROM, AROM, Left, 15 reps, Supine Goniometric ROM: AAROM L knee -5 - 65    General Comments        Pertinent Vitals/Pain Pain Assessment Pain Assessment: 0-10 Pain Score: 2  Pain Location: L knee Pain Descriptors / Indicators: Aching, Sore Pain Intervention(s): Limited activity within patient's tolerance, Monitored during session, Premedicated before session, Ice applied    Home Living  Prior Function            PT Goals (current goals can now be found in the care plan section) Acute Rehab PT Goals Patient Stated Goal: Rehab at [facility] and home PT Goal Formulation: With patient Time For Goal Achievement: 06/15/22 Potential to Achieve Goals: Good Progress towards PT goals: Progressing toward goals    Frequency    7X/week      PT Plan Current plan remains appropriate    Co-evaluation               AM-PAC PT "6 Clicks" Mobility   Outcome Measure  Help needed turning from your back to your side while in a flat bed without using bedrails?: A Lot Help needed moving from lying on your back to sitting on the side of a flat bed without using bedrails?: A Little Help needed moving to and from a bed to a chair (including a wheelchair)?: A Little Help needed standing up from a chair using your arms (e.g., wheelchair or bedside chair)?: A Little Help needed to walk in hospital room?: A Little Help needed climbing 3-5 steps with a railing? : A Little 6 Click Score: 17    End of Session Equipment Utilized During Treatment: Gait belt Activity Tolerance: Patient tolerated treatment well Patient left: in chair;with call bell/phone within reach;with family/visitor present Nurse Communication: Mobility status PT Visit Diagnosis: Difficulty in walking, not elsewhere classified (R26.2)     Time: 2446-2863 PT Time Calculation (min) (ACUTE ONLY): 38 min  Charges:  $Gait Training: 8-22 mins $Therapeutic Exercise: 8-22 mins                     [place] Pager [phone] Office [phone]    Alexxia Stankiewicz 06/09/2022, 11:39 AM

## 2022-06-11 ENCOUNTER — Encounter: Payer: Self-pay | Admitting: Internal Medicine

## 2022-06-11 ENCOUNTER — Non-Acute Institutional Stay (SKILLED_NURSING_FACILITY): Payer: Medicare Other | Admitting: Internal Medicine

## 2022-06-11 DIAGNOSIS — R2689 Other abnormalities of gait and mobility: Secondary | ICD-10-CM | POA: Diagnosis not present

## 2022-06-11 DIAGNOSIS — I480 Paroxysmal atrial fibrillation: Secondary | ICD-10-CM

## 2022-06-11 DIAGNOSIS — M1712 Unilateral primary osteoarthritis, left knee: Secondary | ICD-10-CM | POA: Diagnosis not present

## 2022-06-11 DIAGNOSIS — Z471 Aftercare following joint replacement surgery: Secondary | ICD-10-CM | POA: Diagnosis not present

## 2022-06-11 DIAGNOSIS — R600 Localized edema: Secondary | ICD-10-CM | POA: Diagnosis not present

## 2022-06-11 DIAGNOSIS — E039 Hypothyroidism, unspecified: Secondary | ICD-10-CM

## 2022-06-11 DIAGNOSIS — Z96652 Presence of left artificial knee joint: Secondary | ICD-10-CM

## 2022-06-11 DIAGNOSIS — M62552 Muscle wasting and atrophy, not elsewhere classified, left thigh: Secondary | ICD-10-CM | POA: Diagnosis not present

## 2022-06-11 DIAGNOSIS — N1831 Chronic kidney disease, stage 3a: Secondary | ICD-10-CM

## 2022-06-11 DIAGNOSIS — I10 Essential (primary) hypertension: Secondary | ICD-10-CM | POA: Diagnosis not present

## 2022-06-11 DIAGNOSIS — E785 Hyperlipidemia, unspecified: Secondary | ICD-10-CM | POA: Diagnosis not present

## 2022-06-11 DIAGNOSIS — M25562 Pain in left knee: Secondary | ICD-10-CM | POA: Diagnosis not present

## 2022-06-11 DIAGNOSIS — R278 Other lack of coordination: Secondary | ICD-10-CM | POA: Diagnosis not present

## 2022-06-11 NOTE — Progress Notes (Signed)
Provider:  Dr. Veleta Miners Location:  [place] Room Number: Rehab 151A Place of Service:  SNF (31)  PCP: Virgie Dad, MD Patient Care Team: Virgie Dad, MD as PCP - General (Internal Medicine) Jerline Pain, MD as PCP - Cardiology (Cardiology) [facility] Amaryllis Dyke, Argentina Donovan, NP as Nurse Practitioner (Geriatric Medicine) [name], MD as Consulting Physician (Cardiology) Raynelle Bring, MD as Consulting Physician (Urology)  Extended Emergency Contact Information Primary Emergency Contact: [facility] Phone: [phone] Mobile Phone: [phone] Relation: Daughter  Code Status: Full Code Goals of Care: Advanced Directive information    06/08/2022   11:04 AM  Advanced Directives  Does Patient Have a Medical Advance Directive? Yes  Type of Paramedic of [place];Living will  Does patient want to make changes to medical advance directive? No - Patient declined  Copy of [place] in Chart? No - copy requested      Chief Complaint  Patient presents with   New Admit To SNF    New Admit to Rehab    HPI: Patient is a 88 y.o. male seen today for admission to SNF for therapy  Underwent Left TKA on 06/08/22 In rehab for therapy Pain control with Tylenol Able to walk with mild assist.  Is still needing help with his transfers His only complaint today was frequent urination.  Denies any dysuria fever.  Patient also  He has h/o Arthritis Takes Relafen for past 8-9 years. Tapering has not helped LE edema  BPH with Frequency  Recent Inguinal Hernia repair  H/o Right Shoulder Arthroplasty MVR repair CKD Stage 3 A  Past Medical History:  Diagnosis Date   Aortoiliac occlusive disease ([place])    Carotid artery disease ([place])    Complication of anesthesia 1955   sodium pentathol ?, N/V   Contracture of palmar fascia 06/21/2010   Dysrhythmia    PAF    Enthesopathy of hip region 11/27/2009   Essential hypertension, benign 06/21/2010   GERD (gastroesophageal reflux disease)    occ   Head injury with loss of consciousness ([place]) 2001   short period   Heart murmur    Hyperlipidemia LDL goal <100 06/21/2010   Hypertrophy of prostate without urinary obstruction and other lower urinary tract symptoms (LUTS) 06/21/2010   Hypothyroidism 08/27/2011   Malignant neoplasm of prostate ([place]) 06/21/2010   Mitral regurgitation 10/07/2015   Severe MR noted on ECHO with partially flail post leaflet Oct 03 2015    Osteoarthrosis, unspecified whether generalized or localized, unspecified site 06/21/2010   Other and unspecified hyperlipidemia 06/21/2010   Pain in joint, lower leg 02/25/2012   Palpitations 08/14/2010   Patent foramen ovale 01/04/2016   Closed at the time of mitral valve repair    PONV (postoperative nausea and vomiting)    S/P minimally invasive mitral valve repair 01/04/2016   Complex valvuloplasty including triangular resection of flail segment of posterior leaflet, artificial Gore-tex neochord placement x6 and 55m Sorin Memo 3D ring annuloplasty via right mini thoracotomy approach with closure of PFO and clipping of LA appendage    Unspecified constipation 06/21/2010   Unspecified essential hypertension 06/21/2010   Unspecified hypothyroidism 08/27/2011   Urinary frequency 08/14/2008   Past Surgical History:  Procedure Laterality Date   CARDIAC CATHETERIZATION N/A 11/10/2015   Procedure: Right/Left Heart Cath and Coronary Angiography;  Surgeon: Peter M JMartinique MD;  [facility];  Service: Cardiovascular;  Laterality: N/A;  CATARACT EXTRACTION W/ INTRAOCULAR LENS  IMPLANT, BILATERAL  2006   CLIPPING OF ATRIAL APPENDAGE N/A 01/04/2016   Procedure: CLIPPING OF ATRIAL APPENDAGE;  Surgeon: Rexene Alberts, MD;  Location: [place];  Service: Open Heart Surgery;  Laterality: N/A;   COLONOSCOPY     CORONARY ANGIOPLASTY  2017   Dr Martinique    INGUINAL HERNIA REPAIR Left 12/19/2020   Procedure: LEFT OPEN INGUINAL HERNIA REPAIR WITHOUT MESH;  Surgeon: Kinsinger, Arta Bruce, MD;  Location: [place];  Service: General;  Laterality: Left;  ROOM 3 STARTING AT 11:30AM FOR 60 MIN   KNEE ARTHROSCOPY WITH MENISCAL REPAIR Left 1968   MITRAL VALVE REPAIR Right 01/04/2016   Procedure: MINIMALLY INVASIVE MITRAL VALVE REPAIR (MVR);  Surgeon: Rexene Alberts, MD;  Location: [place];  Service: Open Heart Surgery;  Laterality: Right;   Open knee- ligament repair Right 1955   Dr. Ethel Rana   PROSTATE SURGERY  2000   seed implants   REVERSE SHOULDER ARTHROPLASTY Right 08/12/2020   Procedure: REVERSE SHOULDER ARTHROPLASTY;  Surgeon: Justice Britain, MD;  Location: [place];  Service: Orthopedics;  Laterality: Right;  127mn   TEE WITHOUT CARDIOVERSION N/A 11/10/2015   Procedure: TRANSESOPHAGEAL ECHOCARDIOGRAM (TEE);  Surgeon: TSkeet Latch MD;  [place] [facility]  Service: Cardiovascular;  Laterality: N/A;   TEE WITHOUT CARDIOVERSION N/A 01/04/2016   Procedure: TRANSESOPHAGEAL ECHOCARDIOGRAM (TEE);  Surgeon: CRexene Alberts MD;  [place] [facility]  Service: Open Heart Surgery;  Laterality: N/A;    reports that he quit smoking about 52 years ago. His smoking use included cigarettes and pipe. He has a 2.50 pack-year smoking history. He has never used smokeless tobacco. He reports current alcohol use of about 14.0 standard drinks of alcohol per week. He reports that he does not use drugs. Social History   Socioeconomic History   Marital status: Married    Spouse name: Not on file   Number of children: Not on file   Years of education: Not on file   Highest education level: Not on file  Occupational History   Occupation: retired AEngineer, [employer]   Smoking status: Former    Packs/day: 0.50    Years: 5.00    Total pack years: 2.50    Types: Cigarettes, Pipe    Quit date: 09/04/1969    Years since  quitting: 52.8   Smokeless tobacco: Never  Vaping Use   Vaping Use: Never used  Substance and Sexual Activity   Alcohol use: Yes    Alcohol/week: 14.0 standard drinks of alcohol    Types: 14 Shots of liquor per week    Comment: 2 drinks per day   Drug use: No   Sexual activity: Not Currently  Other Topics Concern   Not on file  Social History Narrative   [facility]   Married JRemo Lipps   Has living will, POA   Retired from [employer] years   Stopped smoking 1970   Exercise run 1 1/2 mile every other day, swim 15 minutes every other day, use machines in gym   Alcohol 2 drinks daily   Social Determinants of Health   Financial Resource Strain: Low Risk  (02/18/2018)   Overall Financial Resource Strain (CARDIA)    Difficulty of Paying Living Expenses: Not hard at all  Food Insecurity: No Food Insecurity (02/18/2018)   Hunger Vital Sign    Worried About RCharity fundraiserin  the Last Year: Never true    [place] in the Last Year: Never true  Transportation Needs: No Transportation Needs (02/18/2018)   PRAPARE - Hydrologist (Medical): No    Lack of Transportation (Non-Medical): No  Physical Activity: Insufficiently Active (02/18/2018)   Exercise Vital Sign    Days of Exercise per Week: 4 days    Minutes of Exercise per Session: 30 min  Stress: Stress Concern Present (02/18/2018)   [place]    Feeling of Stress : To some extent  Social Connections: Moderately Integrated (02/18/2018)   Social Connection and Isolation Panel [NHANES]    Frequency of Communication with Friends and Family: More than three times a week    Frequency of Social Gatherings with Friends and Family: More than three times a week    Attends Religious Services: More than 4 times per year    Active Member of [employer] or Organizations: No    Attends Archivist Meetings: Never     Marital Status: Married  Human resources officer Violence: Not At Risk (02/18/2018)   Humiliation, Afraid, Rape, and Kick questionnaire    Fear of Current or Ex-Partner: No    Emotionally Abused: No    Physically Abused: No    Sexually Abused: No    Functional Status Survey:    Family History  Problem Relation Age of Onset   Heart disease Mother        myocardial infarction   Heart disease Father        myocardial infarction    Health Maintenance  Topic Date Due   COVID-19 Vaccine (5 - Moderna risk series) 11/29/2021   INFLUENZA VACCINE  05/22/2022   TETANUS/TDAP  02/27/2025   Pneumonia Vaccine 71+ Years old  Completed   Zoster Vaccines- Shingrix  Completed   HPV VACCINES  Aged Out    Allergies  Allergen Reactions   Shingrix [Zoster Vac Recomb Adjuvanted]     Outpatient Encounter Medications as of 06/11/2022  Medication Sig   acetaminophen (TYLENOL) 325 MG tablet Take 650 mg by mouth every 4 (four) hours as needed.   alfuzosin (UROXATRAL) 10 MG 24 hr tablet Take 10 mg by mouth daily with breakfast.   amoxicillin (AMOXIL) 500 MG tablet Take 4 tablets 1 hour prior to dental procedures. (Patient taking differently: Take 2,000 mg by mouth See admin instructions. Take 4 tablets 1 hour prior to dental procedures.)   aspirin 81 MG tablet Take 1 tablet (81 mg total) by mouth 2 (two) times daily. for anticoagulation   docusate sodium (COLACE) 100 MG capsule Take 100 mg by mouth 2 (two) times daily.   latanoprost (XALATAN) 0.005 % ophthalmic solution Place 1 drop into both eyes at bedtime.   Levothyroxine Sodium (TIROSINT) 50 MCG CAPS TAKE 1 CAPSULE DAILY BEFORE BREAKFAST   losartan (COZAAR) 25 MG tablet Take 0.5 tablets (12.5 mg total) by mouth daily.   nabumetone (RELAFEN) 750 MG tablet TAKE 1 TABLET TWICE A DAY   nitroGLYCERIN (NITROSTAT) 0.4 MG SL tablet Place 1 tablet (0.4 mg total) under the tongue every 5 (five) minutes as needed for chest pain.   polyethylene glycol (MIRALAX /  GLYCOLAX) 17 g packet Take 17 g by mouth daily as needed for mild constipation.   simvastatin (ZOCOR) 40 MG tablet Take 0.5 tablets (20 mg total) by mouth daily.   torsemide (DEMADEX) 20 MG tablet Take 1 tablet (20 mg  total) by mouth 3 (three) times a week. (Patient taking differently: Take 10 mg by mouth 3 (three) times a week.)   traMADol (ULTRAM) 50 MG tablet Take 1 tablet (50 mg total) by mouth every 6 (six) hours as needed for moderate pain or severe pain.   VESICARE 5 MG tablet Take 5 mg by mouth daily.   dupilumab (DUPIXENT) 300 MG/2ML prefilled syringe Inject 300 mg into the skin every 14 (fourteen) days. (Patient not taking: Reported on 06/11/2022)   traMADol (ULTRAM) 50 MG tablet Take 1 tablet (50 mg total) by mouth every 6 (six) hours as needed for severe pain or moderate pain. (Patient not taking: Reported on 06/11/2022)   triamcinolone cream (KENALOG) 0.1 % Apply 1 application. topically 2 (two) times daily. (Patient not taking: Reported on 06/11/2022)   No facility-administered encounter medications on file as of 06/11/2022.    Review of Systems  Constitutional:  Positive for activity change. Negative for appetite change and unexpected weight change.  HENT: Negative.    Respiratory:  Negative for cough and shortness of breath.   Cardiovascular:  Negative for leg swelling.  Gastrointestinal:  Negative for constipation.  Genitourinary:  Positive for frequency.  Musculoskeletal:  Positive for arthralgias and gait problem. Negative for myalgias.  Skin: Negative.  Negative for rash.  Neurological:  Negative for dizziness and weakness.  Psychiatric/Behavioral:  Negative for confusion and sleep disturbance.   All other systems reviewed and are negative.   Vitals:   06/11/22 1043  BP: (!) 144/77  Pulse: 82  Resp: 14  Temp: 98.2 F (36.8 C)  TempSrc: Skin  SpO2: 92%  Weight: 158 lb (71.7 kg)  Height: 5' 7.5" (1.715 m)   Body mass index is 24.38 kg/m. Physical Exam Vitals  reviewed.  Constitutional:      Appearance: Normal appearance.  HENT:     Head: Normocephalic.     Nose: Nose normal.     Mouth/Throat:     Mouth: Mucous membranes are moist.     Pharynx: Oropharynx is clear.  Eyes:     Pupils: Pupils are equal, round, and reactive to light.  Cardiovascular:     Rate and Rhythm: Normal rate and regular rhythm.     Pulses: Normal pulses.     Heart sounds: No murmur heard. Pulmonary:     Effort: Pulmonary effort is normal. No respiratory distress.     Breath sounds: Normal breath sounds. No rales.  Abdominal:     General: Abdomen is flat. Bowel sounds are normal.     Palpations: Abdomen is soft.  Musculoskeletal:     Cervical back: Neck supple.     Comments: Left Knee has Dressing on the surgical incision  Skin:    General: Skin is warm.  Neurological:     General: No focal deficit present.     Mental Status: He is alert and oriented to person, place, and time.  Psychiatric:        Mood and Affect: Mood normal.        Thought Content: Thought content normal.     Labs reviewed: Basic Metabolic Panel: Recent Labs    04/26/22 0000 06/01/22 1041 06/09/22 0350  NA 137 139 137  K 4.8 4.3 4.0  CL 103 107 108  CO2 25* 26 24  GLUCOSE  --  79 108*  BUN 22* 28* 20  CREATININE 1.5* 1.70* 1.33*  CALCIUM 9.2 9.2 8.1*   Liver Function Tests: Recent Labs    07/25/21  0000 10/24/21 0000  AST 36 27  ALT 25 23  ALKPHOS 96 97  ALBUMIN 4.1 3.9   No results for input(s): "LIPASE", "AMYLASE" in the last 8760 hours. No results for input(s): "AMMONIA" in the last 8760 hours. CBC: Recent Labs    04/26/22 0000 06/01/22 1041 06/09/22 0350  WBC 4.2 4.4 8.0  HGB 12.6* 12.4* 10.1*  HCT 37* 37.5* 30.1*  MCV  --  93.3 92.9  PLT 201 221 178   Cardiac Enzymes: No results for input(s): "CKTOTAL", "CKMB", "CKMBINDEX", "TROPONINI" in the last 8760 hours. BNP: Invalid input(s): "POCBNP" Lab Results  Component Value Date   HGBA1C 5.5 01/02/2016    Lab Results  Component Value Date   TSH 5.46 04/26/2022   No results found for: "VITAMINB12" No results found for: "FOLATE" Lab Results  Component Value Date   IRON 120 06/23/2019    Imaging and Procedures obtained prior to SNF admission: No results found.  Assessment/Plan 1. S/P total knee arthroplasty, left Pain Controlled with Tylenol Continue Therapy On Aspirin WBAT  2. Bilateral leg edema On Torsemide Will decrease the dose to help with Urinary frequency  3. Stage 3a chronic kidney disease (HCC) Repeat BMP  4. Hyperlipidemia LDL goal <100 On Statin  5. Essential hypertension, benign On Cozaar  6. Hypothyroidism, unspecified type Tsh normal in 7/23  7. PAF (paroxysmal atrial fibrillation) (HCC) On Aspirin Has been off Toprol per Dr Marlou Porch 8 Anemia Post op Repeat CBC 9 BPH Uroxatral and Vesicare  Family/ staff Communication:   Labs/tests ordered: RSW,NIO

## 2022-06-12 DIAGNOSIS — M1712 Unilateral primary osteoarthritis, left knee: Secondary | ICD-10-CM | POA: Diagnosis not present

## 2022-06-12 DIAGNOSIS — R2689 Other abnormalities of gait and mobility: Secondary | ICD-10-CM | POA: Diagnosis not present

## 2022-06-12 DIAGNOSIS — Z471 Aftercare following joint replacement surgery: Secondary | ICD-10-CM | POA: Diagnosis not present

## 2022-06-12 DIAGNOSIS — M25562 Pain in left knee: Secondary | ICD-10-CM | POA: Diagnosis not present

## 2022-06-12 DIAGNOSIS — M62552 Muscle wasting and atrophy, not elsewhere classified, left thigh: Secondary | ICD-10-CM | POA: Diagnosis not present

## 2022-06-12 DIAGNOSIS — R278 Other lack of coordination: Secondary | ICD-10-CM | POA: Diagnosis not present

## 2022-06-13 DIAGNOSIS — R2689 Other abnormalities of gait and mobility: Secondary | ICD-10-CM | POA: Diagnosis not present

## 2022-06-13 DIAGNOSIS — M25561 Pain in right knee: Secondary | ICD-10-CM | POA: Diagnosis not present

## 2022-06-13 DIAGNOSIS — Z471 Aftercare following joint replacement surgery: Secondary | ICD-10-CM | POA: Diagnosis not present

## 2022-06-13 DIAGNOSIS — M62552 Muscle wasting and atrophy, not elsewhere classified, left thigh: Secondary | ICD-10-CM | POA: Diagnosis not present

## 2022-06-13 DIAGNOSIS — M1712 Unilateral primary osteoarthritis, left knee: Secondary | ICD-10-CM | POA: Diagnosis not present

## 2022-06-13 DIAGNOSIS — M25562 Pain in left knee: Secondary | ICD-10-CM | POA: Diagnosis not present

## 2022-06-13 DIAGNOSIS — R278 Other lack of coordination: Secondary | ICD-10-CM | POA: Diagnosis not present

## 2022-06-14 DIAGNOSIS — R278 Other lack of coordination: Secondary | ICD-10-CM | POA: Diagnosis not present

## 2022-06-14 DIAGNOSIS — Z471 Aftercare following joint replacement surgery: Secondary | ICD-10-CM | POA: Diagnosis not present

## 2022-06-14 DIAGNOSIS — M25562 Pain in left knee: Secondary | ICD-10-CM | POA: Diagnosis not present

## 2022-06-14 DIAGNOSIS — M62552 Muscle wasting and atrophy, not elsewhere classified, left thigh: Secondary | ICD-10-CM | POA: Diagnosis not present

## 2022-06-14 DIAGNOSIS — M1712 Unilateral primary osteoarthritis, left knee: Secondary | ICD-10-CM | POA: Diagnosis not present

## 2022-06-14 DIAGNOSIS — R2689 Other abnormalities of gait and mobility: Secondary | ICD-10-CM | POA: Diagnosis not present

## 2022-06-15 ENCOUNTER — Encounter: Payer: Self-pay | Admitting: Adult Health

## 2022-06-15 ENCOUNTER — Non-Acute Institutional Stay (SKILLED_NURSING_FACILITY): Payer: Medicare Other | Admitting: Adult Health

## 2022-06-15 DIAGNOSIS — R600 Localized edema: Secondary | ICD-10-CM

## 2022-06-15 DIAGNOSIS — M1712 Unilateral primary osteoarthritis, left knee: Secondary | ICD-10-CM | POA: Diagnosis not present

## 2022-06-15 DIAGNOSIS — R350 Frequency of micturition: Secondary | ICD-10-CM | POA: Diagnosis not present

## 2022-06-15 DIAGNOSIS — R278 Other lack of coordination: Secondary | ICD-10-CM | POA: Diagnosis not present

## 2022-06-15 DIAGNOSIS — Z471 Aftercare following joint replacement surgery: Secondary | ICD-10-CM | POA: Diagnosis not present

## 2022-06-15 DIAGNOSIS — M25561 Pain in right knee: Secondary | ICD-10-CM | POA: Diagnosis not present

## 2022-06-15 DIAGNOSIS — Z96652 Presence of left artificial knee joint: Secondary | ICD-10-CM | POA: Diagnosis not present

## 2022-06-15 DIAGNOSIS — R2689 Other abnormalities of gait and mobility: Secondary | ICD-10-CM | POA: Diagnosis not present

## 2022-06-15 DIAGNOSIS — M62552 Muscle wasting and atrophy, not elsewhere classified, left thigh: Secondary | ICD-10-CM | POA: Diagnosis not present

## 2022-06-15 DIAGNOSIS — M25562 Pain in left knee: Secondary | ICD-10-CM | POA: Diagnosis not present

## 2022-06-15 NOTE — Progress Notes (Addendum)
Location:   [facility] Room Number: [place] of Service:  SNF [phone]) Provider:  Royal Hawthorn, NP  Virgie Dad, MD  Patient Care Team: Virgie Dad, MD as PCP - General (Internal Medicine) Jerline Pain, MD as PCP - Cardiology (Cardiology) [facility] Amaryllis Dyke, [place] Donovan, NP as Nurse Practitioner (Geriatric Medicine) [name], MD as Consulting Physician (Cardiology) Raynelle Bring, MD as Consulting Physician (Urology)  Extended Emergency Contact Information Primary Emergency Contact: [facility] Phone: [phone] Mobile Phone: [phone] Relation: Daughter  Code Status:   Goals of care: Advanced Directive information    06/15/2022    9:53 AM  Advanced Directives  Does Patient Have a Medical Advance Directive? Yes  Type of Advance Directive Living will  Does patient want to make changes to medical advance directive? No - Patient declined     Chief Complaint  Patient presents with   Acute Visit    HPI:  Pt is a 88 y.o. male seen today for an acute visit for edema.   He was seen earlier this week and torsemide was reduced due to urinary frequency. He does not feel the frequency has improved but his edema is worse and he is concerned. He is not having sob or cp. No calf pain. Denies any dysuria, fever. Feels like he is emptying well.   Noted hx of prostate ca with surgery and seed implant 2000. Also hx of BPH on uroxatral and vesicare.   He is s/p Left TKA 06/08/22 and is ambulating with a walker. He is wearing compression hose. Tylenol I is controlling his pain.   Reviewed echo 2018 which mitral valve repair functioning, Grade 1 DD EF 55%  Past Medical History:  Diagnosis Date   Aortoiliac occlusive disease ([place])    Carotid artery disease ([place])    Complication of anesthesia 1955   sodium pentathol ?, N/V   Contracture of palmar fascia 06/21/2010   Dysrhythmia    PAF   Enthesopathy  of hip region 11/27/2009   Essential hypertension, benign 06/21/2010   GERD (gastroesophageal reflux disease)    occ   Head injury with loss of consciousness ([place]) 2001   short period   Heart murmur    Hyperlipidemia LDL goal <100 06/21/2010   Hypertrophy of prostate without urinary obstruction and other lower urinary tract symptoms (LUTS) 06/21/2010   Hypothyroidism 08/27/2011   Malignant neoplasm of prostate ([place]) 06/21/2010   Mitral regurgitation 10/07/2015   Severe MR noted on ECHO with partially flail post leaflet Oct 03 2015    Osteoarthrosis, unspecified whether generalized or localized, unspecified site 06/21/2010   Other and unspecified hyperlipidemia 06/21/2010   Pain in joint, lower leg 02/25/2012   Palpitations 08/14/2010   Patent foramen ovale 01/04/2016   Closed at the time of mitral valve repair    PONV (postoperative nausea and vomiting)    S/P minimally invasive mitral valve repair 01/04/2016   Complex valvuloplasty including triangular resection of flail segment of posterior leaflet, artificial Gore-tex neochord placement x6 and 63m Sorin Memo 3D ring annuloplasty via right mini thoracotomy approach with closure of PFO and clipping of LA appendage    Unspecified constipation 06/21/2010   Unspecified essential hypertension 06/21/2010   Unspecified hypothyroidism 08/27/2011   Urinary frequency 08/14/2008   Past Surgical History:  Procedure Laterality Date   CARDIAC CATHETERIZATION N/A 11/10/2015   Procedure: Right/Left Heart Cath and Coronary Angiography;  Surgeon: Peter M JMartinique MD;  Location:  [facility] IN[facility]IVE [facility];  Service: Cardiovascular;  Laterality: N/A;   CATARACT EXTRACTION W/ INTRAOCULAR LENS  IMPLANT, BILATERAL  2006   CLIPPING OF ATRIAL APPENDAGE N/A 01/04/2016   Procedure: CLIPPING OF ATRIAL APPENDAGE;  Surgeon: Rexene Alberts, MD;  Location: [facility];  Service: Open Heart Surgery;  Laterality: N/A;   COLONOSCOPY     CORONARY ANGIOPLASTY  2017   Dr Martinique   INGUINAL  HERNIA REPAIR Left 12/19/2020   Procedure: LEFT OPEN INGUINAL HERNIA REPAIR WITHOUT MESH;  Surgeon: Kinsinger, Arta Bruce, MD;  Location: [facility];  Service: General;  Laterality: Left;  ROOM 3 STARTING AT 11:30AM FOR 60 MIN   KNEE ARTHROSCOPY WITH MENISCAL REPAIR Left 1968   MITRAL VALVE REPAIR Right 01/04/2016   Procedure: MINIMALLY INVASIVE MITRAL VALVE REPAIR (MVR);  Surgeon: Rexene Alberts, MD;  Location: [facility];  Service: Open Heart Surgery;  Laterality: Right;   Open knee- ligament repair Right 1955   Dr. Ethel Rana   PROSTATE SURGERY  2000   seed implants   REVERSE SHOULDER ARTHROPLASTY Right 08/12/2020   Procedure: REVERSE SHOULDER ARTHROPLASTY;  Surgeon: Justice Britain, MD;  Location: [facility];  Service: Orthopedics;  Laterality: Right;  140mn   TEE WITHOUT CARDIOVERSION N/A 11/10/2015   Procedure: TRANSESOPHAGEAL ECHOCARDIOGRAM (TEE);  Surgeon: TSkeet Latch MD;  [place] [facility]  Service: Cardiovascular;  Laterality: N/A;   TEE WITHOUT CARDIOVERSION N/A 01/04/2016   Procedure: TRANSESOPHAGEAL ECHOCARDIOGRAM (TEE);  Surgeon: CRexene Alberts MD;  Location: [facility]  Service: Open Heart Surgery;  Laterality: N/A;   TOTAL KNEE ARTHROPLASTY Left 06/08/2022   Procedure: LEFT TOTAL KNEE ARTHROPLASTY;  Surgeon: NNetta Cedars MD;  Location: [facility];  Service: Orthopedics;  Laterality: Left;    Allergies  Allergen Reactions   Shingrix [Zoster Vac Recomb Adjuvanted]     Allergies as of 06/15/2022       Reactions   Shingrix [zoster Vac Recomb Adjuvanted]         Medication List        Accurate as of June 15, 2022  9:56 AM. If you have any questions, ask your nurse or doctor.          STOP taking these medications    triamcinolone cream 0.1 % Commonly known as: KENALOG Stopped by: CRoyal Hawthorn NP       TAKE these medications    acetaminophen 325 MG tablet Commonly known as: TYLENOL Take 650 mg by mouth every 4 (four) hours as needed.    alfuzosin 10 MG 24 hr tablet Commonly known as: UROXATRAL Take 10 mg by mouth daily with breakfast.   amoxicillin 250 MG chewable tablet Commonly known as: AMOXIL Chew 500 mg by mouth as needed. '2000mg'$  1x one hour prior to dental procedures. What changed: Another medication with the same name was removed. Continue taking this medication, and follow the directions you see here. Changed by: CRoyal Hawthorn NP   aspirin 81 MG tablet Take 1 tablet (81 mg total) by mouth 2 (two) times daily. for anticoagulation   docusate sodium 100 MG capsule Commonly known as: COLACE Take 100 mg by mouth 2 (two) times daily.   Dupixent 300 MG/2ML prefilled syringe Generic drug: dupilumab Inject 300 mg into the skin every 14 (fourteen) days.   latanoprost 0.005 % ophthalmic solution Commonly known as: XALATAN Place 1 drop into both eyes at bedtime.   Levothyroxine Sodium 50 MCG Caps Commonly known as: Tirosint TAKE 1 CAPSULE DAILY BEFORE BREAKFAST  losartan 25 MG tablet Commonly known as: COZAAR Take 0.5 tablets (12.5 mg total) by mouth daily.   nabumetone 750 MG tablet Commonly known as: RELAFEN TAKE 1 TABLET TWICE A DAY   nitroGLYCERIN 0.4 MG SL tablet Commonly known as: NITROSTAT Place 1 tablet (0.4 mg total) under the tongue every 5 (five) minutes as needed for chest pain.   omeprazole 20 MG capsule Commonly known as: PRILOSEC Take 20 mg by mouth daily.   polyethylene glycol 17 g packet Commonly known as: MIRALAX / GLYCOLAX Take 17 g by mouth daily as needed for mild constipation.   simvastatin 40 MG tablet Commonly known as: ZOCOR Take 0.5 tablets (20 mg total) by mouth daily.   Timolol Maleate (Once-Daily) 0.5 % Soln Apply 1 drop to eye daily. Left eye every morning   torsemide 20 MG tablet Commonly known as: DEMADEX Take 10 mg by mouth daily. Take Torsemide '10mg'$  PO 3x week. What changed: Another medication with the same name was removed. Continue taking this  medication, and follow the directions you see here. Changed by: Royal Hawthorn, NP   traMADol 50 MG tablet Commonly known as: Ultram Take 1 tablet (50 mg total) by mouth every 6 (six) hours as needed for severe pain or moderate pain.   VESIcare 5 MG tablet Generic drug: solifenacin Take 5 mg by mouth daily.        Review of Systems  Constitutional:  Positive for activity change. Negative for appetite change, chills, diaphoresis, fatigue, fever and unexpected weight change.  Respiratory:  Negative for cough, shortness of breath, wheezing and stridor.   Cardiovascular:  Positive for leg swelling. Negative for chest pain and palpitations.  Gastrointestinal:  Negative for abdominal distention, abdominal pain, constipation and diarrhea.  Genitourinary:  Positive for frequency. Negative for decreased urine volume, difficulty urinating, dysuria, flank pain, scrotal swelling, testicular pain and urgency.  Musculoskeletal:  Positive for gait problem. Negative for arthralgias, back pain, joint swelling and myalgias.  Neurological:  Negative for dizziness, seizures, syncope, facial asymmetry, speech difficulty, weakness and headaches.  Hematological:  Negative for adenopathy. Does not bruise/bleed easily.  Psychiatric/Behavioral:  Negative for agitation, behavioral problems and confusion.     Immunization History  Administered Date(s) Administered   Influenza, High Dose Seasonal PF 08/16/2017, 08/22/2020   Influenza,inj,Quad PF,6+ Mos 07/20/2016, 08/15/2018, 07/31/2019   Influenza-Unspecified 07/27/2013, 07/07/2014, 08/11/2015, 08/11/2021   Moderna Covid-19 Vaccine Bivalent Booster 8yr & up 10/04/2021   Moderna SARS-COV2 Booster Vaccination 05/26/2021   Moderna Sars-Covid-2 Vaccination 11/30/2018, 11/03/2019, 09/06/2020   Pneumococcal Conjugate-13 10/26/2014   Pneumococcal Polysaccharide-23 11/23/2015   Td 10/23/2007   Tdap 02/28/2015   Zoster Recombinat (Shingrix) 12/03/2016,  03/06/2017   Zoster, Live 10/22/2008   Pertinent  Health Maintenance Due  Topic Date Due   INFLUENZA VACCINE  05/22/2022      02/21/2022    9:07 AM 05/02/2022   10:25 AM 06/08/2022   10:47 AM 06/08/2022   10:00 PM 06/09/2022    9:00 AM  Fall Risk  Falls in the past year? 0 0     Was there an injury with Fall? 0 0     Fall Risk Category Calculator 0 0     Fall Risk Category Low Low     Patient Fall Risk Level Low fall risk Low fall risk High fall risk High fall risk High fall risk  Patient at Risk for Falls Due to No Fall Risks No Fall Risks     Fall risk Follow up  Falls evaluation completed Falls evaluation completed      Functional Status Survey:    Vitals:   06/15/22 0938  BP: (!) 142/77  Pulse: 70  Resp: 14  Temp: (!) 97.5 F (36.4 C)  SpO2: 96%  Weight: 162 lb (73.5 kg)  Height: 5' 7.5" (1.715 m)   Body mass index is 25 kg/m. Physical Exam Vitals and nursing note reviewed.  Constitutional:      General: He is not in acute distress.    Appearance: He is not diaphoretic.  HENT:     Head: Normocephalic and atraumatic.  Neck:     Thyroid: No thyromegaly.     Vascular: No JVD.     Trachea: No tracheal deviation.  Cardiovascular:     Rate and Rhythm: Normal rate and regular rhythm.     Heart sounds: No murmur heard. Pulmonary:     Effort: Pulmonary effort is normal. No respiratory distress.     Breath sounds: Normal breath sounds. No wheezing.  Abdominal:     General: Bowel sounds are normal. There is no distension.     Palpations: Abdomen is soft.     Tenderness: There is no abdominal tenderness.  Musculoskeletal:     Right lower leg: No edema.     Left lower leg: Edema (+2) present.  Lymphadenopathy:     Cervical: No cervical adenopathy.  Skin:    General: Skin is warm and dry.     Comments: Left knee incision covered with dressing. No surrounding erythema. Dark drainage on dressing. Has surrounding knee and thigh bruising.   Neurological:     Mental  Status: He is alert and oriented to person, place, and time.     Cranial Nerves: No cranial nerve deficit.     Labs reviewed: Recent Labs    04/26/22 0000 06/01/22 1041 06/09/22 0350  NA 137 139 137  K 4.8 4.3 4.0  CL 103 107 108  CO2 25* 26 24  GLUCOSE  --  79 108*  BUN 22* 28* 20  CREATININE 1.5* 1.70* 1.33*  CALCIUM 9.2 9.2 8.1*   Recent Labs    07/25/21 0000 10/24/21 0000  AST 36 27  ALT 25 23  ALKPHOS 96 97  ALBUMIN 4.1 3.9   Recent Labs    04/26/22 0000 06/01/22 1041 06/09/22 0350  WBC 4.2 4.4 8.0  HGB 12.6* 12.4* 10.1*  HCT 37* 37.5* 30.1*  MCV  --  93.3 92.9  PLT 201 221 178   Lab Results  Component Value Date   TSH 5.46 04/26/2022   Lab Results  Component Value Date   HGBA1C 5.5 01/02/2016   Lab Results  Component Value Date   CHOL 160 03/06/2022   HDL 59 03/06/2022   LDLCALC 88 03/06/2022   TRIG 70 03/06/2022    Significant Diagnostic Results in last 30 days:  No results found.  Assessment/Plan  1. Urinary frequency Hx of BPH and feels he is emptying well No change in symptom with reduction in torsemide.  If continues he should f/u with urology   2. Bilateral leg edema Worsening and bothersome to him, would like to go back to high dose of torsemide.   3. S/P total knee arthroplasty, left Working with therapy WBAT Continue tylenol discussed tapering as tolerated.    Family/ staff Communication:   Labs/tests ordered:

## 2022-06-18 DIAGNOSIS — I5032 Chronic diastolic (congestive) heart failure: Secondary | ICD-10-CM | POA: Diagnosis not present

## 2022-06-18 DIAGNOSIS — M25562 Pain in left knee: Secondary | ICD-10-CM | POA: Diagnosis not present

## 2022-06-18 DIAGNOSIS — M1712 Unilateral primary osteoarthritis, left knee: Secondary | ICD-10-CM | POA: Diagnosis not present

## 2022-06-18 DIAGNOSIS — R2689 Other abnormalities of gait and mobility: Secondary | ICD-10-CM | POA: Diagnosis not present

## 2022-06-18 DIAGNOSIS — R278 Other lack of coordination: Secondary | ICD-10-CM | POA: Diagnosis not present

## 2022-06-18 DIAGNOSIS — M62552 Muscle wasting and atrophy, not elsewhere classified, left thigh: Secondary | ICD-10-CM | POA: Diagnosis not present

## 2022-06-18 DIAGNOSIS — Z471 Aftercare following joint replacement surgery: Secondary | ICD-10-CM | POA: Diagnosis not present

## 2022-06-18 LAB — BASIC METABOLIC PANEL
BUN: 19 (ref 4–21)
CO2: 26 — AB (ref 13–22)
Chloride: 104 (ref 99–108)
Creatinine: 1.3 (ref 0.6–1.3)
Glucose: 86
Potassium: 4.8 mEq/L (ref 3.5–5.1)
Sodium: 139 (ref 137–147)

## 2022-06-18 LAB — COMPREHENSIVE METABOLIC PANEL
Calcium: 8.6 — AB (ref 8.7–10.7)
eGFR: 54

## 2022-06-19 DIAGNOSIS — Z471 Aftercare following joint replacement surgery: Secondary | ICD-10-CM | POA: Diagnosis not present

## 2022-06-19 DIAGNOSIS — R278 Other lack of coordination: Secondary | ICD-10-CM | POA: Diagnosis not present

## 2022-06-19 DIAGNOSIS — M25561 Pain in right knee: Secondary | ICD-10-CM | POA: Diagnosis not present

## 2022-06-19 DIAGNOSIS — M62552 Muscle wasting and atrophy, not elsewhere classified, left thigh: Secondary | ICD-10-CM | POA: Diagnosis not present

## 2022-06-19 DIAGNOSIS — R2689 Other abnormalities of gait and mobility: Secondary | ICD-10-CM | POA: Diagnosis not present

## 2022-06-19 DIAGNOSIS — M25562 Pain in left knee: Secondary | ICD-10-CM | POA: Diagnosis not present

## 2022-06-19 DIAGNOSIS — M1712 Unilateral primary osteoarthritis, left knee: Secondary | ICD-10-CM | POA: Diagnosis not present

## 2022-06-20 DIAGNOSIS — R2689 Other abnormalities of gait and mobility: Secondary | ICD-10-CM | POA: Diagnosis not present

## 2022-06-20 DIAGNOSIS — Z471 Aftercare following joint replacement surgery: Secondary | ICD-10-CM | POA: Diagnosis not present

## 2022-06-20 DIAGNOSIS — M25562 Pain in left knee: Secondary | ICD-10-CM | POA: Diagnosis not present

## 2022-06-20 DIAGNOSIS — M62552 Muscle wasting and atrophy, not elsewhere classified, left thigh: Secondary | ICD-10-CM | POA: Diagnosis not present

## 2022-06-20 DIAGNOSIS — M25561 Pain in right knee: Secondary | ICD-10-CM | POA: Diagnosis not present

## 2022-06-20 DIAGNOSIS — Z4789 Encounter for other orthopedic aftercare: Secondary | ICD-10-CM | POA: Diagnosis not present

## 2022-06-20 DIAGNOSIS — M1712 Unilateral primary osteoarthritis, left knee: Secondary | ICD-10-CM | POA: Diagnosis not present

## 2022-06-20 DIAGNOSIS — R278 Other lack of coordination: Secondary | ICD-10-CM | POA: Diagnosis not present

## 2022-06-21 DIAGNOSIS — M62552 Muscle wasting and atrophy, not elsewhere classified, left thigh: Secondary | ICD-10-CM | POA: Diagnosis not present

## 2022-06-21 DIAGNOSIS — M1712 Unilateral primary osteoarthritis, left knee: Secondary | ICD-10-CM | POA: Diagnosis not present

## 2022-06-21 DIAGNOSIS — Z471 Aftercare following joint replacement surgery: Secondary | ICD-10-CM | POA: Diagnosis not present

## 2022-06-21 DIAGNOSIS — R278 Other lack of coordination: Secondary | ICD-10-CM | POA: Diagnosis not present

## 2022-06-21 DIAGNOSIS — M25562 Pain in left knee: Secondary | ICD-10-CM | POA: Diagnosis not present

## 2022-06-21 DIAGNOSIS — R2689 Other abnormalities of gait and mobility: Secondary | ICD-10-CM | POA: Diagnosis not present

## 2022-06-22 ENCOUNTER — Non-Acute Institutional Stay (SKILLED_NURSING_FACILITY): Payer: Medicare Other | Admitting: Adult Health

## 2022-06-22 ENCOUNTER — Encounter: Payer: Self-pay | Admitting: Adult Health

## 2022-06-22 DIAGNOSIS — R350 Frequency of micturition: Secondary | ICD-10-CM

## 2022-06-22 DIAGNOSIS — M25562 Pain in left knee: Secondary | ICD-10-CM | POA: Diagnosis not present

## 2022-06-22 DIAGNOSIS — M159 Polyosteoarthritis, unspecified: Secondary | ICD-10-CM | POA: Diagnosis not present

## 2022-06-22 DIAGNOSIS — D62 Acute posthemorrhagic anemia: Secondary | ICD-10-CM | POA: Diagnosis not present

## 2022-06-22 DIAGNOSIS — K5901 Slow transit constipation: Secondary | ICD-10-CM | POA: Diagnosis not present

## 2022-06-22 DIAGNOSIS — I10 Essential (primary) hypertension: Secondary | ICD-10-CM | POA: Diagnosis not present

## 2022-06-22 DIAGNOSIS — Z96652 Presence of left artificial knee joint: Secondary | ICD-10-CM

## 2022-06-22 DIAGNOSIS — R2689 Other abnormalities of gait and mobility: Secondary | ICD-10-CM | POA: Diagnosis not present

## 2022-06-22 DIAGNOSIS — N1831 Chronic kidney disease, stage 3a: Secondary | ICD-10-CM | POA: Diagnosis not present

## 2022-06-22 DIAGNOSIS — E785 Hyperlipidemia, unspecified: Secondary | ICD-10-CM | POA: Diagnosis not present

## 2022-06-22 DIAGNOSIS — M62552 Muscle wasting and atrophy, not elsewhere classified, left thigh: Secondary | ICD-10-CM | POA: Diagnosis not present

## 2022-06-22 DIAGNOSIS — R278 Other lack of coordination: Secondary | ICD-10-CM | POA: Diagnosis not present

## 2022-06-22 DIAGNOSIS — N401 Enlarged prostate with lower urinary tract symptoms: Secondary | ICD-10-CM

## 2022-06-22 DIAGNOSIS — Z471 Aftercare following joint replacement surgery: Secondary | ICD-10-CM | POA: Diagnosis not present

## 2022-06-22 DIAGNOSIS — M1712 Unilateral primary osteoarthritis, left knee: Secondary | ICD-10-CM | POA: Diagnosis not present

## 2022-06-22 NOTE — Progress Notes (Signed)
Location:  [place] Room Number: NO/151/A Place of Service:  SNF [phone])  Provider: Royal Hawthorn, NP   PCP: Virgie Dad, MD Patient Care Team: Virgie Dad, MD as PCP - General (Internal Medicine) Jerline Pain, MD as PCP - Cardiology (Cardiology) [facility] Amaryllis Dyke, Argentina Donovan, NP as Nurse Practitioner (Geriatric Medicine) [name], MD as Consulting Physician (Cardiology) Raynelle Bring, MD as Consulting Physician (Urology)  Extended Emergency Contact Information Primary Emergency Contact: [facility] Phone: [phone] Mobile Phone: [phone] Relation: Daughter  Code Status: FULL Goals of care:  Advanced Directive information    06/22/2022    9:21 AM  Advanced Directives  Does Patient Have a Medical Advance Directive? Yes  Type of Advance Directive Living will  Does patient want to make changes to medical advance directive? No - Patient declined     Allergies  Allergen Reactions   Shingrix [Zoster Vac Recomb Adjuvanted]     Chief Complaint  Patient presents with   Discharge Note    Patient is being discharged from facility    HPI:  88 y.o. male seen for discharge from [place] rehab.PMH significant for mitral valve repair, OA, HTN, HLD, leg edema, prosate ca with BPH, CAD with angioplasty in 2017, GERD, hypothyroidism,   He was admitted after  having a Left knee TKA due to OA on 06/08/22.  He has been working with therapy and made gaines. He is walking with a walker. He is only using tylenol for pain. Bowels are moving well. He has urinary frequency which is not new but slightly worse since surgery. We tried reducing the torsemide but it did not help and his leg edema got worse. He prefers to have the higher dose of torsemide. He is not having dysuria, bladder pain, or fever. He does have a mild acute blood loss anemia and was placed on iron by orthopedics this weeks.  Noted hx of  prostate ca with surgery and seed implant 2000. Also hx of BPH on uroxatral and vesicare.   He is ready for discharge and requesting to go home today.        Past Medical History:  Diagnosis Date   Aortoiliac occlusive disease ([place])    Carotid artery disease ([place])    Complication of anesthesia 1955   sodium pentathol ?, N/V   Contracture of palmar fascia 06/21/2010   Dysrhythmia    PAF   Enthesopathy of hip region 11/27/2009   Essential hypertension, benign 06/21/2010   GERD (gastroesophageal reflux disease)    occ   Head injury with loss of consciousness ([place]) 2001   short period   Heart murmur    Hyperlipidemia LDL goal <100 06/21/2010   Hypertrophy of prostate without urinary obstruction and other lower urinary tract symptoms (LUTS) 06/21/2010   Hypothyroidism 08/27/2011   Malignant neoplasm of prostate ([place]) 06/21/2010   Mitral regurgitation 10/07/2015   Severe MR noted on ECHO with partially flail post leaflet Oct 03 2015    Osteoarthrosis, unspecified whether generalized or localized, unspecified site 06/21/2010   Other and unspecified hyperlipidemia 06/21/2010   Pain in joint, lower leg 02/25/2012   Palpitations 08/14/2010   Patent foramen ovale 01/04/2016   Closed at the time of mitral valve repair    PONV (postoperative nausea and vomiting)    S/P minimally invasive mitral valve repair 01/04/2016   Complex valvuloplasty including triangular resection of flail segment of posterior leaflet, artificial Gore-tex neochord placement x6 and 31m  Sorin Memo 3D ring annuloplasty via right mini thoracotomy approach with closure of PFO and clipping of LA appendage    Unspecified constipation 06/21/2010   Unspecified essential hypertension 06/21/2010   Unspecified hypothyroidism 08/27/2011   Urinary frequency 08/14/2008    Past Surgical History:  Procedure Laterality Date   CARDIAC CATHETERIZATION N/A 11/10/2015   Procedure: Right/Left Heart Cath and Coronary Angiography;  Surgeon: Peter M  Martinique, MD;  Location: [place] [facility];  Service: Cardiovascular;  Laterality: N/A;   CATARACT EXTRACTION W/ INTRAOCULAR LENS  IMPLANT, BILATERAL  2006   CLIPPING OF ATRIAL APPENDAGE N/A 01/04/2016   Procedure: CLIPPING OF ATRIAL APPENDAGE;  Surgeon: Rexene Alberts, MD;  Location: [place];  Service: Open Heart Surgery;  Laterality: N/A;   COLONOSCOPY     CORONARY ANGIOPLASTY  2017   Dr Martinique   INGUINAL HERNIA REPAIR Left 12/19/2020   Procedure: LEFT OPEN INGUINAL HERNIA REPAIR WITHOUT MESH;  Surgeon: Kinsinger, Arta Bruce, MD;  Location: [place];  Service: General;  Laterality: Left;  ROOM 3 STARTING AT 11:30AM FOR 60 MIN   KNEE ARTHROSCOPY WITH MENISCAL REPAIR Left 1968   MITRAL VALVE REPAIR Right 01/04/2016   Procedure: MINIMALLY INVASIVE MITRAL VALVE REPAIR (MVR);  Surgeon: Rexene Alberts, MD;  Location: [place];  Service: Open Heart Surgery;  Laterality: Right;   Open knee- ligament repair Right 1955   Dr. Ethel Rana   PROSTATE SURGERY  2000   seed implants   REVERSE SHOULDER ARTHROPLASTY Right 08/12/2020   Procedure: REVERSE SHOULDER ARTHROPLASTY;  Surgeon: Justice Britain, MD;  Location: [place];  Service: Orthopedics;  Laterality: Right;  131mn   TEE WITHOUT CARDIOVERSION N/A 11/10/2015   Procedure: TRANSESOPHAGEAL ECHOCARDIOGRAM (TEE);  Surgeon: TSkeet Latch MD;  Location: [place]  Service: Cardiovascular;  Laterality: N/A;   TEE WITHOUT CARDIOVERSION N/A 01/04/2016   Procedure: TRANSESOPHAGEAL ECHOCARDIOGRAM (TEE);  Surgeon: CRexene Alberts MD;  [place]  Service: Open Heart Surgery;  Laterality: N/A;   TOTAL KNEE ARTHROPLASTY Left 06/08/2022   Procedure: LEFT TOTAL KNEE ARTHROPLASTY;  Surgeon: NNetta Cedars MD;  Location: [facility];  Service: Orthopedics;  Laterality: Left;      reports that he quit smoking about 52 years ago. His smoking use included cigarettes and pipe. He has a 2.50 pack-year smoking history. He has never used smokeless tobacco.  He reports current alcohol use of about 14.0 standard drinks of alcohol per week. He reports that he does not use drugs. Social History   Socioeconomic History   Marital status: Married    Spouse name: Not on file   Number of children: Not on file   Years of education: Not on file   Highest education level: Not on file  Occupational History   Occupation: retired AEngineer, [employer]   Smoking status: Former    Packs/day: 0.50    Years: 5.00    Total pack years: 2.50    Types: Cigarettes, Pipe    Quit date: 09/04/1969    Years since quitting: 52.8   Smokeless tobacco: Never  Vaping Use   Vaping Use: Never used  Substance and Sexual Activity   Alcohol use: Yes    Alcohol/week: 14.0 standard drinks of alcohol    Types: 14 Shots of liquor per week    Comment: 2 drinks per day   Drug use: No   Sexual activity: Not Currently  Other Topics Concern   Not on file  Social History Narrative   Lives at [facility] since 05/2010   Married [name]    Has living will, POA   Retired from [employer] after 29 years   Stopped smoking 1970   Exercise run 1 1/2 mile every other day, swim 15 minutes every other day, use machines in gym   Alcohol 2 drinks daily   Social Determinants of Health   Financial Resource Strain: Low Risk  (02/18/2018)   Overall Financial Resource Strain (CARDIA)    Difficulty of Paying Living Expenses: Not hard at all  Food Insecurity: No Food Insecurity (02/18/2018)   Hunger Vital Sign    Worried About Running Out of Food in the Last Year: Never true    [place] in the Last Year: Never true  Transportation Needs: No Transportation Needs (02/18/2018)   PRAPARE - Hydrologist (Medical): No    Lack of Transportation (Non-Medical): No  Physical Activity: Insufficiently Active (02/18/2018)   Exercise Vital Sign    Days of Exercise per Week: 4 days    Minutes of Exercise per Session: 30 min  Stress: Stress  Concern Present (02/18/2018)   [place]    Feeling of Stress : To some extent  Social Connections: Moderately Integrated (02/18/2018)   Social Connection and Isolation Panel [NHANES]    Frequency of Communication with Friends and Family: More than three times a week    Frequency of Social Gatherings with Friends and Family: More than three times a week    Attends Religious Services: More than 4 times per year    Active Member of [employer] or Organizations: No    Attends Archivist Meetings: Never    Marital Status: Married  Human resources officer Violence: Not At Risk (02/18/2018)   Humiliation, Afraid, Rape, and Kick questionnaire    Fear of Current or Ex-Partner: No    Emotionally Abused: No    Physically Abused: No    Sexually Abused: No   Functional Status Survey:    Allergies  Allergen Reactions   Shingrix [Zoster Vac Recomb Adjuvanted]     Pertinent  Health Maintenance Due  Topic Date Due   INFLUENZA VACCINE  05/22/2022    Medications: Outpatient Encounter Medications as of 06/22/2022  Medication Sig   acetaminophen (TYLENOL) 325 MG tablet Take 650 mg by mouth every 4 (four) hours as needed.   alfuzosin (UROXATRAL) 10 MG 24 hr tablet Take 10 mg by mouth daily with breakfast.   amoxicillin (AMOXIL) 250 MG chewable tablet Chew 500 mg by mouth as needed. '2000mg'$  1x one hour prior to dental procedures.   aspirin 81 MG tablet Take 1 tablet (81 mg total) by mouth 2 (two) times daily. for anticoagulation   docusate sodium (COLACE) 100 MG capsule Take 100 mg by mouth 2 (two) times daily.   dupilumab (DUPIXENT) 300 MG/2ML prefilled syringe Inject 300 mg into the skin every 14 (fourteen) days.   latanoprost (XALATAN) 0.005 % ophthalmic solution Place 1 drop into both eyes at bedtime.   Levothyroxine Sodium (TIROSINT) 50 MCG CAPS TAKE 1 CAPSULE DAILY BEFORE BREAKFAST   losartan (COZAAR) 25 MG tablet Take 0.5 tablets  (12.5 mg total) by mouth daily.   nabumetone (RELAFEN) 750 MG tablet TAKE 1 TABLET TWICE A DAY   nitroGLYCERIN (NITROSTAT) 0.4 MG SL tablet Place 1 tablet (0.4 mg total) under the tongue every 5 (five) minutes as needed for chest pain.  omeprazole (PRILOSEC) 20 MG capsule Take 20 mg by mouth daily.   polyethylene glycol (MIRALAX / GLYCOLAX) 17 g packet Take 17 g by mouth daily as needed for mild constipation.   simvastatin (ZOCOR) 40 MG tablet Take 0.5 tablets (20 mg total) by mouth daily.   Timolol Maleate, Once-Daily, 0.5 % SOLN Apply 1 drop to eye daily. Left eye every morning   torsemide (DEMADEX) 20 MG tablet Take 20 mg by mouth 3 (three) times a week. .   traMADol (ULTRAM) 50 MG tablet Take 1 tablet (50 mg total) by mouth every 6 (six) hours as needed for severe pain or moderate pain.   VESICARE 5 MG tablet Take 5 mg by mouth daily.   No facility-administered encounter medications on file as of 06/22/2022.    Review of Systems  Constitutional:  Positive for activity change. Negative for appetite change, chills, diaphoresis, fatigue, fever and unexpected weight change.  Respiratory:  Negative for cough, shortness of breath, wheezing and stridor.   Cardiovascular:  Positive for leg swelling. Negative for chest pain and palpitations.  Gastrointestinal:  Negative for abdominal distention, abdominal pain, constipation and diarrhea.  Genitourinary:  Positive for frequency. Negative for difficulty urinating and dysuria.  Musculoskeletal:  Positive for gait problem. Negative for arthralgias, back pain, joint swelling and myalgias.  Neurological:  Negative for dizziness, seizures, syncope, facial asymmetry, speech difficulty, weakness and headaches.  Hematological:  Negative for adenopathy. Does not bruise/bleed easily.  Psychiatric/Behavioral:  Negative for agitation, behavioral problems and confusion.     Vitals:   06/22/22 0926  BP: 115/61  Pulse: 74  Resp: 14  Temp: 98 F (36.7 C)   SpO2: 96%  Weight: 160 lb (72.6 kg)  Height: '5\' 7"'$  (1.702 m)   Body mass index is 25.06 kg/m. Physical Exam Vitals and nursing note reviewed.  Constitutional:      General: He is not in acute distress.    Appearance: He is not diaphoretic.  HENT:     Head: Normocephalic and atraumatic.  Neck:     Thyroid: No thyromegaly.     Vascular: No JVD.     Trachea: No tracheal deviation.  Cardiovascular:     Rate and Rhythm: Normal rate and regular rhythm.     Heart sounds: No murmur heard. Pulmonary:     Effort: Pulmonary effort is normal. No respiratory distress.     Breath sounds: Normal breath sounds. No wheezing.  Abdominal:     General: Bowel sounds are normal. There is no distension.     Palpations: Abdomen is soft.     Tenderness: There is no abdominal tenderness.  Musculoskeletal:     Comments: BLE edema +1 L>R  Lymphadenopathy:     Cervical: No cervical adenopathy.  Skin:    General: Skin is warm and dry.     Comments: Left knee incision with no erythema or drainage.   Neurological:     Mental Status: He is alert and oriented to person, place, and time.     Cranial Nerves: No cranial nerve deficit.     Labs reviewed: Basic Metabolic Panel: Recent Labs    04/26/22 0000 06/01/22 1041 06/09/22 0350  NA 137 139 137  K 4.8 4.3 4.0  CL 103 107 108  CO2 25* 26 24  GLUCOSE  --  79 108*  BUN 22* 28* 20  CREATININE 1.5* 1.70* 1.33*  CALCIUM 9.2 9.2 8.1*   Liver Function Tests: Recent Labs    07/25/21 0000 10/24/21  0000  AST 36 27  ALT 25 23  ALKPHOS 96 97  ALBUMIN 4.1 3.9   No results for input(s): "LIPASE", "AMYLASE" in the last 8760 hours. No results for input(s): "AMMONIA" in the last 8760 hours. CBC: Recent Labs    04/26/22 0000 06/01/22 1041 06/09/22 0350  WBC 4.2 4.4 8.0  HGB 12.6* 12.4* 10.1*  HCT 37* 37.5* 30.1*  MCV  --  93.3 92.9  PLT 201 221 178   Cardiac Enzymes: No results for input(s): "CKTOTAL", "CKMB", "CKMBINDEX", "TROPONINI"  in the last 8760 hours. BNP: Invalid input(s): "POCBNP" CBG: No results for input(s): "GLUCAP" in the last 8760 hours.  Procedures and Imaging Studies During Stay: No results found.  Assessment/Plan:    1. Primary osteoarthritis involving multiple joints Takes relafen long term On prilosec while on asa with relafen.    2. S/P total knee arthroplasty, left WBAT Follow up with ortho as indicated.  Tylenol for pain Discussed signs of a DVT  3. Slow transit constipation Continue colace and miralax   4. Acute blood loss anemia Lab Results  Component Value Date   HGB 10.1 (L) 06/09/2022   On ferrous sulfate.  CBC on 8/28 was not drawn.  Will have him f/u in one month with CBC   5. Stage 3a chronic kidney disease ([place]) Continue to periodically monitor BMP and avoid nephrotoxic agents  6. Hyperlipidemia LDL goal <100 Lab Results  Component Value Date   LDLCALC 88 03/06/2022    On zocor   7. Essential hypertension, benign Controlled on low dose losartan   8. Benign prostatic hyperplasia with urinary frequency Slight worsening No sign of infection Continue current meds, f/u if needed   [facility] for discharge to [place]   Patient is being discharged with the following home health services:  therapy   Patient is being discharged with the following durable medical equipment:  has walker   Patient has been advised to f/u with their PCP in 1-2 weeks to for a transitions of care visit.  Social services at their facility was responsible for arranging this appointment.  Pt was provided with adequate prescriptions of noncontrolled medications to reach the scheduled appointment .  For controlled substances, a limited supply was provided as appropriate for the individual patient.  If the pt normally receives these medications from a pain clinic or has a contract with another physician, these medications should be received from that clinic or physician only).    Future labs/tests needed:   CBC     Discharge review, assessment, plan, and coordination took >30 min

## 2022-06-26 DIAGNOSIS — R278 Other lack of coordination: Secondary | ICD-10-CM | POA: Diagnosis not present

## 2022-06-26 DIAGNOSIS — M62552 Muscle wasting and atrophy, not elsewhere classified, left thigh: Secondary | ICD-10-CM | POA: Diagnosis not present

## 2022-06-26 DIAGNOSIS — M25562 Pain in left knee: Secondary | ICD-10-CM | POA: Diagnosis not present

## 2022-06-26 DIAGNOSIS — Z471 Aftercare following joint replacement surgery: Secondary | ICD-10-CM | POA: Diagnosis not present

## 2022-06-26 DIAGNOSIS — M25561 Pain in right knee: Secondary | ICD-10-CM | POA: Diagnosis not present

## 2022-06-26 DIAGNOSIS — R2689 Other abnormalities of gait and mobility: Secondary | ICD-10-CM | POA: Diagnosis not present

## 2022-06-26 DIAGNOSIS — M1712 Unilateral primary osteoarthritis, left knee: Secondary | ICD-10-CM | POA: Diagnosis not present

## 2022-06-28 DIAGNOSIS — M1712 Unilateral primary osteoarthritis, left knee: Secondary | ICD-10-CM | POA: Diagnosis not present

## 2022-06-28 DIAGNOSIS — M62552 Muscle wasting and atrophy, not elsewhere classified, left thigh: Secondary | ICD-10-CM | POA: Diagnosis not present

## 2022-06-28 DIAGNOSIS — R278 Other lack of coordination: Secondary | ICD-10-CM | POA: Diagnosis not present

## 2022-06-28 DIAGNOSIS — M25561 Pain in right knee: Secondary | ICD-10-CM | POA: Diagnosis not present

## 2022-06-28 DIAGNOSIS — M25562 Pain in left knee: Secondary | ICD-10-CM | POA: Diagnosis not present

## 2022-06-28 DIAGNOSIS — Z471 Aftercare following joint replacement surgery: Secondary | ICD-10-CM | POA: Diagnosis not present

## 2022-06-28 DIAGNOSIS — R2689 Other abnormalities of gait and mobility: Secondary | ICD-10-CM | POA: Diagnosis not present

## 2022-07-02 DIAGNOSIS — R2689 Other abnormalities of gait and mobility: Secondary | ICD-10-CM | POA: Diagnosis not present

## 2022-07-02 DIAGNOSIS — M25561 Pain in right knee: Secondary | ICD-10-CM | POA: Diagnosis not present

## 2022-07-02 DIAGNOSIS — M25562 Pain in left knee: Secondary | ICD-10-CM | POA: Diagnosis not present

## 2022-07-02 DIAGNOSIS — Z471 Aftercare following joint replacement surgery: Secondary | ICD-10-CM | POA: Diagnosis not present

## 2022-07-02 DIAGNOSIS — M1712 Unilateral primary osteoarthritis, left knee: Secondary | ICD-10-CM | POA: Diagnosis not present

## 2022-07-02 DIAGNOSIS — M62552 Muscle wasting and atrophy, not elsewhere classified, left thigh: Secondary | ICD-10-CM | POA: Diagnosis not present

## 2022-07-02 DIAGNOSIS — R278 Other lack of coordination: Secondary | ICD-10-CM | POA: Diagnosis not present

## 2022-07-05 DIAGNOSIS — Z471 Aftercare following joint replacement surgery: Secondary | ICD-10-CM | POA: Diagnosis not present

## 2022-07-05 DIAGNOSIS — M25561 Pain in right knee: Secondary | ICD-10-CM | POA: Diagnosis not present

## 2022-07-05 DIAGNOSIS — R2689 Other abnormalities of gait and mobility: Secondary | ICD-10-CM | POA: Diagnosis not present

## 2022-07-05 DIAGNOSIS — M1712 Unilateral primary osteoarthritis, left knee: Secondary | ICD-10-CM | POA: Diagnosis not present

## 2022-07-05 DIAGNOSIS — M62552 Muscle wasting and atrophy, not elsewhere classified, left thigh: Secondary | ICD-10-CM | POA: Diagnosis not present

## 2022-07-05 DIAGNOSIS — R278 Other lack of coordination: Secondary | ICD-10-CM | POA: Diagnosis not present

## 2022-07-05 DIAGNOSIS — M25562 Pain in left knee: Secondary | ICD-10-CM | POA: Diagnosis not present

## 2022-07-11 DIAGNOSIS — M25562 Pain in left knee: Secondary | ICD-10-CM | POA: Diagnosis not present

## 2022-07-11 DIAGNOSIS — Z471 Aftercare following joint replacement surgery: Secondary | ICD-10-CM | POA: Diagnosis not present

## 2022-07-11 DIAGNOSIS — M1712 Unilateral primary osteoarthritis, left knee: Secondary | ICD-10-CM | POA: Diagnosis not present

## 2022-07-11 DIAGNOSIS — M62552 Muscle wasting and atrophy, not elsewhere classified, left thigh: Secondary | ICD-10-CM | POA: Diagnosis not present

## 2022-07-11 DIAGNOSIS — R278 Other lack of coordination: Secondary | ICD-10-CM | POA: Diagnosis not present

## 2022-07-11 DIAGNOSIS — R2689 Other abnormalities of gait and mobility: Secondary | ICD-10-CM | POA: Diagnosis not present

## 2022-07-23 ENCOUNTER — Non-Acute Institutional Stay: Payer: Medicare Other | Admitting: Adult Health

## 2022-07-23 ENCOUNTER — Encounter: Payer: Self-pay | Admitting: Adult Health

## 2022-07-23 VITALS — BP 118/72 | HR 86 | Temp 97.9°F | Ht 67.0 in | Wt 164.0 lb

## 2022-07-23 DIAGNOSIS — N400 Enlarged prostate without lower urinary tract symptoms: Secondary | ICD-10-CM

## 2022-07-23 DIAGNOSIS — Z96652 Presence of left artificial knee joint: Secondary | ICD-10-CM

## 2022-07-23 DIAGNOSIS — D62 Acute posthemorrhagic anemia: Secondary | ICD-10-CM | POA: Diagnosis not present

## 2022-07-23 DIAGNOSIS — E039 Hypothyroidism, unspecified: Secondary | ICD-10-CM | POA: Diagnosis not present

## 2022-07-23 DIAGNOSIS — N1831 Chronic kidney disease, stage 3a: Secondary | ICD-10-CM | POA: Diagnosis not present

## 2022-07-23 DIAGNOSIS — E785 Hyperlipidemia, unspecified: Secondary | ICD-10-CM | POA: Diagnosis not present

## 2022-07-23 DIAGNOSIS — Z9889 Other specified postprocedural states: Secondary | ICD-10-CM

## 2022-07-23 DIAGNOSIS — I10 Essential (primary) hypertension: Secondary | ICD-10-CM | POA: Diagnosis not present

## 2022-07-23 DIAGNOSIS — M159 Polyosteoarthritis, unspecified: Secondary | ICD-10-CM

## 2022-07-23 NOTE — Progress Notes (Signed)
Location:  [facility]   POS: Clinic  Provider:  Cindi Carbon, [place] [phone]    Goals of Care:     06/22/2022    9:21 AM  Advanced Directives  Does Patient Have a Medical Advance Directive? Yes  Type of Advance Directive Living will  Does patient want to make changes to medical advance directive? No - Patient declined     Chief Complaint  Patient presents with   Medical Management of Chronic Issues    Rehab Follow up.    Quality Metric Gaps    To discuss Need for Covid and Flu or postpone if patient refuses. NCIR Verified.   Had flu shot today, will get covid vaccine soon at [facility]   HPI: Patient is a 88 y.o. male seen today for medical management of chronic diseases.    PMH significant for mitral valve repair, OA, HTN, HLD, leg edema, prosate ca with BPH, CAD with angioplasty in 2017, GERD, hypothyroidism,   He was admitted to rehab after  having a Left knee TKA due to OA on 06/08/22 and has worked with therapy and is now here in the clinic for follow up. He says he is doing better and is ready to start exercising again.   Goal is to swim 2 days a week and walk 3 days a week  Still has some swelling in his left leg, which is unchanged. Takes torsemide three times weekly. No calf warmth, pain or tenderness.  Has been on Relafen for years for arthritis and does not want to taper. Has multiple joints with arthritic pain.  Does has CKD but labs are improved from last check  Has chronic urinary frequency which was worse after surgery but is now back to baseline.   After knee surgery he had acute blood loss anemia and took iron for month.  Lab Results  Component Value Date   HGB 10.1 (L) 06/09/2022    Past Medical History:  Diagnosis Date   Aortoiliac occlusive disease ([place])    Carotid artery disease ([place])    Complication of anesthesia 1955   sodium pentathol ?, N/V   Contracture of palmar fascia 06/21/2010   Dysrhythmia    PAF    Enthesopathy of hip region 11/27/2009   Essential hypertension, benign 06/21/2010   GERD (gastroesophageal reflux disease)    occ   Head injury with loss of consciousness ([place]) 2001   short period   Heart murmur    Hyperlipidemia LDL goal <100 06/21/2010   Hypertrophy of prostate without urinary obstruction and other lower urinary tract symptoms (LUTS) 06/21/2010   Hypothyroidism 08/27/2011   Malignant neoplasm of prostate ([place]) 06/21/2010   Mitral regurgitation 10/07/2015   Severe MR noted on ECHO with partially flail post leaflet Oct 03 2015    Osteoarthrosis, unspecified whether generalized or localized, unspecified site 06/21/2010   Other and unspecified hyperlipidemia 06/21/2010   Pain in joint, lower leg 02/25/2012   Palpitations 08/14/2010   Patent foramen ovale 01/04/2016   Closed at the time of mitral valve repair    PONV (postoperative nausea and vomiting)    S/P minimally invasive mitral valve repair 01/04/2016   Complex valvuloplasty including triangular resection of flail segment of posterior leaflet, artificial Gore-tex neochord placement x6 and 44m Sorin Memo 3D ring annuloplasty via right mini thoracotomy approach with closure of PFO and clipping of LA appendage    Unspecified constipation 06/21/2010   Unspecified essential hypertension 06/21/2010   Unspecified hypothyroidism  08/27/2011   Urinary frequency 08/14/2008    Past Surgical History:  Procedure Laterality Date   CARDIAC CATHETERIZATION N/A 11/10/2015   Procedure: Right/Left Heart Cath and Coronary Angiography;  Surgeon: Peter M Martinique, MD;  Location: [facility];  Service: Cardiovascular;  Laterality: N/A;   CATARACT EXTRACTION W/ INTRAOCULAR LENS  IMPLANT, BILATERAL  2006   CLIPPING OF ATRIAL APPENDAGE N/A 01/04/2016   Procedure: CLIPPING OF ATRIAL APPENDAGE;  Surgeon: Rexene Alberts, MD;  Location: [facility];  Service: Open Heart Surgery;  Laterality: N/A;   COLONOSCOPY     CORONARY ANGIOPLASTY  2017   Dr Martinique    INGUINAL HERNIA REPAIR Left 12/19/2020   Procedure: LEFT OPEN INGUINAL HERNIA REPAIR WITHOUT MESH;  Surgeon: Kinsinger, Arta Bruce, MD;  Location: [facility];  Service: General;  Laterality: Left;  ROOM 3 STARTING AT 11:30AM FOR 60 MIN   KNEE ARTHROSCOPY WITH MENISCAL REPAIR Left 1968   MITRAL VALVE REPAIR Right 01/04/2016   Procedure: MINIMALLY INVASIVE MITRAL VALVE REPAIR (MVR);  Surgeon: Rexene Alberts, MD;  Location: [facility];  Service: Open Heart Surgery;  Laterality: Right;   Open knee- ligament repair Right 1955   Dr. Ethel Rana   PROSTATE SURGERY  2000   seed implants   REVERSE SHOULDER ARTHROPLASTY Right 08/12/2020   Procedure: REVERSE SHOULDER ARTHROPLASTY;  Surgeon: Justice Britain, MD;  Location: [place];  Service: Orthopedics;  Laterality: Right;  111mn   TEE WITHOUT CARDIOVERSION N/A 11/10/2015   Procedure: TRANSESOPHAGEAL ECHOCARDIOGRAM (TEE);  Surgeon: TSkeet Latch MD;  [place] [facility]  Service: Cardiovascular;  Laterality: N/A;   TEE WITHOUT CARDIOVERSION N/A 01/04/2016   Procedure: TRANSESOPHAGEAL ECHOCARDIOGRAM (TEE);  Surgeon: CRexene Alberts MD;  [place] [facility]  Service: Open Heart Surgery;  Laterality: N/A;   TOTAL KNEE ARTHROPLASTY Left 06/08/2022   Procedure: LEFT TOTAL KNEE ARTHROPLASTY;  Surgeon: NNetta Cedars MD;  Location: [facility];  Service: Orthopedics;  Laterality: Left;    Allergies  Allergen Reactions   Shingrix [Zoster Vac Recomb Adjuvanted]     Outpatient Encounter Medications as of 07/23/2022  Medication Sig   acetaminophen (TYLENOL) 325 MG tablet Take 650 mg by mouth every 4 (four) hours as needed.   alfuzosin (UROXATRAL) 10 MG 24 hr tablet Take 10 mg by mouth daily with breakfast.   amoxicillin (AMOXIL) 250 MG chewable tablet Chew 500 mg by mouth as needed. '2000mg'$  1x one hour prior to dental procedures.   docusate sodium (COLACE) 100 MG capsule Take 100 mg by mouth 2 (two) times daily.   dupilumab (DUPIXENT) 300 MG/2ML  prefilled syringe Inject 300 mg into the skin every 14 (fourteen) days.   latanoprost (XALATAN) 0.005 % ophthalmic solution Place 1 drop into both eyes at bedtime.   Levothyroxine Sodium (TIROSINT) 50 MCG CAPS TAKE 1 CAPSULE DAILY BEFORE BREAKFAST   losartan (COZAAR) 25 MG tablet Take 0.5 tablets (12.5 mg total) by mouth daily.   nabumetone (RELAFEN) 750 MG tablet TAKE 1 TABLET TWICE A DAY   nitroGLYCERIN (NITROSTAT) 0.4 MG SL tablet Place 1 tablet (0.4 mg total) under the tongue every 5 (five) minutes as needed for chest pain.   polyethylene glycol (MIRALAX / GLYCOLAX) 17 g packet Take 17 g by mouth daily as needed for mild constipation.   simvastatin (ZOCOR) 40 MG tablet Take 0.5 tablets (20 mg total) by mouth daily.   Timolol Maleate, Once-Daily, 0.5 % SOLN Apply 1 drop to eye daily. Left eye every morning  torsemide (DEMADEX) 20 MG tablet Take 20 mg by mouth 3 (three) times a week. .   VESICARE 5 MG tablet Take 5 mg by mouth daily.   ferrous sulfate 325 (65 FE) MG tablet Take 325 mg by mouth 2 (two) times daily with a meal.   [DISCONTINUED] omeprazole (PRILOSEC) 20 MG capsule Take 20 mg by mouth daily. While on asa   No facility-administered encounter medications on file as of 07/23/2022.    Review of Systems:  Review of Systems  Constitutional:  Negative for activity change, appetite change, chills, diaphoresis, fatigue, fever and unexpected weight change.  Respiratory:  Negative for cough, shortness of breath, wheezing and stridor.   Cardiovascular:  Positive for leg swelling. Negative for chest pain and palpitations.  Gastrointestinal:  Negative for abdominal distention, abdominal pain, constipation and diarrhea.  Genitourinary:  Negative for difficulty urinating and dysuria.  Musculoskeletal:  Positive for arthralgias. Negative for back pain, gait problem, joint swelling and myalgias.  Neurological:  Negative for dizziness, seizures, syncope, facial asymmetry, speech difficulty,  weakness and headaches.  Hematological:  Negative for adenopathy. Does not bruise/bleed easily.  Psychiatric/Behavioral:  Negative for agitation, behavioral problems and confusion.     Health Maintenance  Topic Date Due   COVID-19 Vaccine (5 - Moderna risk series) 11/29/2021   INFLUENZA VACCINE  05/22/2022   TETANUS/TDAP  02/27/2025   Pneumonia Vaccine 67+ Years old  Completed   Zoster Vaccines- Shingrix  Completed   HPV VACCINES  Aged Out    Physical Exam: Vitals:   07/23/22 1401  BP: 118/72  Pulse: 86  Temp: 97.9 F (36.6 C)  SpO2: 96%  Weight: 164 lb (74.4 kg)  Height: '5\' 7"'$  (1.702 m)   Body mass index is 25.69 kg/m. Wt Readings from Last 3 Encounters:  07/23/22 164 lb (74.4 kg)  06/22/22 160 lb (72.6 kg)  06/15/22 162 lb (73.5 kg)   Physical Exam Vitals reviewed.  Constitutional:      General: He is not in acute distress.    Appearance: He is not diaphoretic.  HENT:     Head: Normocephalic and atraumatic.     Nose: Nose normal.     Mouth/Throat:     Mouth: Mucous membranes are moist.     Pharynx: Oropharynx is clear.  Eyes:     Conjunctiva/sclera: Conjunctivae normal.     Pupils: Pupils are equal, round, and reactive to light.  Neck:     Thyroid: No thyromegaly.     Vascular: No JVD.     Trachea: No tracheal deviation.  Cardiovascular:     Rate and Rhythm: Normal rate and regular rhythm.     Heart sounds: No murmur heard. Pulmonary:     Effort: Pulmonary effort is normal. No respiratory distress.     Breath sounds: Normal breath sounds. No wheezing.  Abdominal:     General: Bowel sounds are normal. There is no distension.     Palpations: Abdomen is soft.     Tenderness: There is no abdominal tenderness.  Musculoskeletal:     Right lower leg: Edema (+1) present.     Left lower leg: Edema (+2) present.  Lymphadenopathy:     Cervical: No cervical adenopathy.  Skin:    General: Skin is warm and dry.  Neurological:     Mental Status: He is alert and  oriented to person, place, and time.     Cranial Nerves: No cranial nerve deficit.     Labs reviewed: Basic Metabolic Panel:  Recent Labs    10/24/21 0000 03/06/22 0000 04/26/22 0000 06/01/22 1041 06/09/22 0350 06/18/22 0000  NA 141 142 137 139 137 139  K 4.6 4.5 4.8 4.3 4.0 4.8  CL 101 105 103 107 108 104  CO2 24* 23* 25* 26 24 26*  GLUCOSE  --   --   --  79 108*  --   BUN 28* 27* 22* 28* 20 19  CREATININE 1.5* 1.6* 1.5* 1.70* 1.33* 1.3  CALCIUM 9.1 8.7 9.2 9.2 8.1* 8.6*  TSH 5.60 4.45 5.46  --   --   --    Liver Function Tests: Recent Labs    07/25/21 0000 10/24/21 0000  AST 36 27  ALT 25 23  ALKPHOS 96 97  ALBUMIN 4.1 3.9   No results for input(s): "LIPASE", "AMYLASE" in the last 8760 hours. No results for input(s): "AMMONIA" in the last 8760 hours. CBC: Recent Labs    04/26/22 0000 06/01/22 1041 06/09/22 0350  WBC 4.2 4.4 8.0  HGB 12.6* 12.4* 10.1*  HCT 37* 37.5* 30.1*  MCV  --  93.3 92.9  PLT 201 221 178   Lipid Panel: Recent Labs    07/25/21 0000 10/24/21 0000 03/06/22 0000  CHOL 161 185 160  HDL 68 63 59  LDLCALC 78 100 88  TRIG 74 106 70   Lab Results  Component Value Date   HGBA1C 5.5 01/02/2016    Procedures since last visit: No results found.  Assessment/Plan  1. Primary osteoarthritis involving multiple joints Does not want to attempt reduction with relafen which has helped him so much  BUN/Cr has improved.   2. S/P total knee arthroplasty, left Has improved after rehab stay and therapy   3. Essential hypertension, benign Controlled   4. Hypothyroidism, unspecified type Lab Results  Component Value Date   TSH 5.46 04/26/2022  On Synthroid, recheck at next visit.   5. Stage 3a chronic kidney disease [facility]) Lab Results  Component Value Date   BUN 19 06/18/2022   Lab Results  Component Value Date   CREATININE 1.3 06/18/2022  improved  6. Hyperlipidemia LDL goal <100 Lab Results  Component Value Date   LDLCALC 88  03/06/2022  On zocor    7. S/P minimally invasive mitral valve repair Takes amoxicillin for dental apts  8. Benign prostatic hyperplasia, unspecified whether lower urinary tract symptoms present Has frequency unchanged from baseline.  Continue current meds.   9. Acute blood loss anemia Took one month of iron Repeat CBC in am  10. Edema No warmth or tenderness Unchanged.  Due to venous insuff.  Takes torsemide three times a week Quit wearing hose due to lack of benefit per patient     Labs/tests ordered:  * No order type specified *  TSH Lipid CBC BMP Next appt:  F/U with Dr Lyndel Safe in 6 months    Total time 15mn:  time greater than 50% of total time spent doing pt counseling and coordination of care

## 2022-07-24 ENCOUNTER — Other Ambulatory Visit: Payer: Self-pay

## 2022-07-24 DIAGNOSIS — I5032 Chronic diastolic (congestive) heart failure: Secondary | ICD-10-CM | POA: Diagnosis not present

## 2022-07-24 DIAGNOSIS — B351 Tinea unguium: Secondary | ICD-10-CM | POA: Diagnosis not present

## 2022-07-24 LAB — CBC AND DIFFERENTIAL
HCT: 32 — AB (ref 41–53)
Hemoglobin: 11 — AB (ref 13.5–17.5)
Platelets: 237 10*3/uL (ref 150–400)
WBC: 4.1

## 2022-07-24 LAB — CBC: RBC: 3.35 — AB (ref 3.87–5.11)

## 2022-07-24 MED ORDER — ALFUZOSIN HCL ER 10 MG PO TB24
10.0000 mg | ORAL_TABLET | Freq: Every day | ORAL | 1 refills | Status: DC
Start: 2022-07-24 — End: 2023-03-28

## 2022-07-24 NOTE — Telephone Encounter (Signed)
Patient has request refill on medication through [facility]. Patient medication pend and sent to PCP Virgie Dad, MD for approval. Please Advise.

## 2022-07-24 NOTE — Telephone Encounter (Signed)
Medication refilled by Royal Hawthorn, NP.

## 2022-07-25 DIAGNOSIS — M25562 Pain in left knee: Secondary | ICD-10-CM | POA: Diagnosis not present

## 2022-07-25 DIAGNOSIS — R2689 Other abnormalities of gait and mobility: Secondary | ICD-10-CM | POA: Diagnosis not present

## 2022-07-25 DIAGNOSIS — Z471 Aftercare following joint replacement surgery: Secondary | ICD-10-CM | POA: Diagnosis not present

## 2022-07-25 DIAGNOSIS — M62552 Muscle wasting and atrophy, not elsewhere classified, left thigh: Secondary | ICD-10-CM | POA: Diagnosis not present

## 2022-07-25 DIAGNOSIS — R278 Other lack of coordination: Secondary | ICD-10-CM | POA: Diagnosis not present

## 2022-07-25 DIAGNOSIS — M1712 Unilateral primary osteoarthritis, left knee: Secondary | ICD-10-CM | POA: Diagnosis not present

## 2022-08-03 ENCOUNTER — Other Ambulatory Visit (HOSPITAL_BASED_OUTPATIENT_CLINIC_OR_DEPARTMENT_OTHER): Payer: Self-pay

## 2022-08-03 DIAGNOSIS — Z23 Encounter for immunization: Secondary | ICD-10-CM | POA: Diagnosis not present

## 2022-08-03 MED ORDER — FLUAD QUADRIVALENT 0.5 ML IM PRSY
PREFILLED_SYRINGE | INTRAMUSCULAR | 0 refills | Status: DC
  Filled 2022-08-03: qty 0.5, 1d supply, fill #0

## 2022-08-17 DIAGNOSIS — Z8546 Personal history of malignant neoplasm of prostate: Secondary | ICD-10-CM | POA: Diagnosis not present

## 2022-08-22 ENCOUNTER — Other Ambulatory Visit: Payer: Self-pay

## 2022-08-22 DIAGNOSIS — Z23 Encounter for immunization: Secondary | ICD-10-CM | POA: Diagnosis not present

## 2022-08-22 MED ORDER — SIMVASTATIN 20 MG PO TABS: 20.0000 mg | ORAL_TABLET | Freq: Every day | ORAL | 3 refills | Status: DC

## 2022-08-22 NOTE — Telephone Encounter (Signed)
Pharmacy send a request for the Simvastatin 20 mg instead of the '40mg'$  due to patient not getting equal half of medication of the 40 mg. Simvastatin 20 mg is pending for provider approval.

## 2022-08-24 DIAGNOSIS — R350 Frequency of micturition: Secondary | ICD-10-CM | POA: Diagnosis not present

## 2022-08-24 DIAGNOSIS — N401 Enlarged prostate with lower urinary tract symptoms: Secondary | ICD-10-CM | POA: Diagnosis not present

## 2022-09-17 ENCOUNTER — Ambulatory Visit: Payer: Medicare Other | Attending: Cardiology | Admitting: Cardiology

## 2022-09-17 ENCOUNTER — Encounter: Payer: Self-pay | Admitting: Cardiology

## 2022-09-17 VITALS — BP 130/60 | HR 72 | Ht 67.0 in | Wt 168.6 lb

## 2022-09-17 DIAGNOSIS — I10 Essential (primary) hypertension: Secondary | ICD-10-CM | POA: Diagnosis not present

## 2022-09-17 DIAGNOSIS — I4891 Unspecified atrial fibrillation: Secondary | ICD-10-CM | POA: Insufficient documentation

## 2022-09-17 DIAGNOSIS — I480 Paroxysmal atrial fibrillation: Secondary | ICD-10-CM | POA: Diagnosis not present

## 2022-09-17 DIAGNOSIS — Z9889 Other specified postprocedural states: Secondary | ICD-10-CM | POA: Insufficient documentation

## 2022-09-17 NOTE — Progress Notes (Signed)
Cardiology Office Note:    Date:  09/17/2022   ID:  Samuel [name], DOB [birth year], MRN [id number]  PCP:  Virgie Dad, MD  [facility] Cardiologist:  Candee Furbish, MD  [facility] Electrophysiologist:  None   Referring MD: Virgie Dad, MD     History of Present Illness:    Samuel [name] is a 88 y.o. male here for the follow-up of paroxysmal atrial fibrillation.  Prior clipping of left atrial appendage.  Small PFO closure in 2017.  Catheterization previously sewed normal LV function and no CAD.  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 Celebrated his 65th anniversary from [place].  His wife continues to have worsening dementia.  At his last visit with me in 2021 he was doing very well and exercising routinely including weight lifting.  Today, he states that he has been doing well. He is swimming 3 days a week and has continued to weight lift without difficulty.  He denies any syncope or falls. He denies any palpitations, chest pain, shortness of breath, or peripheral edema. No lightheadedness, headaches, orthopnea, or PND.   Past Medical History:  Diagnosis Date   Aortoiliac occlusive disease ([place])    Carotid artery disease ([place])    Complication of anesthesia 1955   sodium pentathol ?, N/V   Contracture of palmar fascia 06/21/2010   Dysrhythmia    PAF   Enthesopathy of hip region 11/27/2009   Essential hypertension, benign 06/21/2010   GERD (gastroesophageal reflux disease)    occ   Head injury with loss of consciousness ([place]) 2001   short period   Heart murmur    Hyperlipidemia LDL goal <100 06/21/2010   Hypertrophy of prostate without urinary obstruction and other lower urinary tract symptoms (LUTS) 06/21/2010   Hypothyroidism 08/27/2011   Malignant neoplasm of prostate ([place]) 06/21/2010   Mitral regurgitation 10/07/2015   Severe MR noted on ECHO with partially flail post leaflet Oct 03 2015    Osteoarthrosis, unspecified whether  generalized or localized, unspecified site 06/21/2010   Other and unspecified hyperlipidemia 06/21/2010   Pain in joint, lower leg 02/25/2012   Palpitations 08/14/2010   Patent foramen ovale 01/04/2016   Closed at the time of mitral valve repair    PONV (postoperative nausea and vomiting)    S/P minimally invasive mitral valve repair 01/04/2016   Complex valvuloplasty including triangular resection of flail segment of posterior leaflet, artificial Gore-tex neochord placement x6 and 37m Sorin Memo 3D ring annuloplasty via right mini thoracotomy approach with closure of PFO and clipping of LA appendage    Unspecified constipation 06/21/2010   Unspecified essential hypertension 06/21/2010   Unspecified hypothyroidism 08/27/2011   Urinary frequency 08/14/2008    Past Surgical History:  Procedure Laterality Date   CARDIAC CATHETERIZATION N/A 11/10/2015   Procedure: Right/Left Heart Cath and Coronary Angiography;  Surgeon: Peter M JMartinique MD;  [facility];  Service: Cardiovascular;  Laterality: N/A;   CATARACT EXTRACTION W/ INTRAOCULAR LENS  IMPLANT, BILATERAL  2006   CLIPPING OF ATRIAL APPENDAGE N/A 01/04/2016   Procedure: CLIPPING OF ATRIAL APPENDAGE;  Surgeon: CRexene Alberts MD;  [facility]  Service: Open Heart Surgery;  Laterality: N/A;   COLONOSCOPY     CORONARY ANGIOPLASTY  2017   Dr JMartinique  INGUINAL HERNIA REPAIR Left 12/19/2020   Procedure: LEFT OPEN INGUINAL HERNIA REPAIR WITHOUT MESH;  Surgeon: Kinsinger, LArta Bruce MD;  [facility]  Service: General;  Laterality: Left;  ROOM 3 STARTING AT 11:30AM FOR 60 MIN   KNEE ARTHROSCOPY WITH MENISCAL REPAIR Left 1968   MITRAL VALVE REPAIR Right 01/04/2016   Procedure: MINIMALLY INVASIVE MITRAL VALVE REPAIR (MVR);  Surgeon: Rexene Alberts, MD;  Location: [facility];  Service: Open Heart Surgery;  Laterality: Right;   Open knee- ligament repair Right 1955   Dr. Ethel Rana   PROSTATE SURGERY  2000   seed  implants   REVERSE SHOULDER ARTHROPLASTY Right 08/12/2020   Procedure: REVERSE SHOULDER ARTHROPLASTY;  Surgeon: Justice Britain, MD;  Location: [facility];  Service: Orthopedics;  Laterality: Right;  146mn   TEE WITHOUT CARDIOVERSION N/A 11/10/2015   Procedure: TRANSESOPHAGEAL ECHOCARDIOGRAM (TEE);  Surgeon: TSkeet Latch MD;  [facility]  Service: Cardiovascular;  Laterality: N/A;   TEE WITHOUT CARDIOVERSION N/A 01/04/2016   Procedure: TRANSESOPHAGEAL ECHOCARDIOGRAM (TEE);  Surgeon: CRexene Alberts MD;  [facility]  Service: Open Heart Surgery;  Laterality: N/A;   TOTAL KNEE ARTHROPLASTY Left 06/08/2022   Procedure: LEFT TOTAL KNEE ARTHROPLASTY;  Surgeon: NNetta Cedars MD;  [facility];  Service: Orthopedics;  Laterality: Left;    Current Medications: Current Meds  Medication Sig   acetaminophen (TYLENOL) 325 MG tablet Take 650 mg by mouth every 4 (four) hours as needed.   alfuzosin (UROXATRAL) 10 MG 24 hr tablet Take 1 tablet (10 mg total) by mouth daily with breakfast.   amoxicillin (AMOXIL) 250 MG chewable tablet Chew 500 mg by mouth as needed. '2000mg'$  1x one hour prior to dental procedures.   dupilumab (DUPIXENT) 300 MG/2ML prefilled syringe Inject 300 mg into the skin every 14 (fourteen) days.   influenza vaccine adjuvanted (FLUAD QUADRIVALENT) 0.5 ML injection Inject into the muscle.   latanoprost (XALATAN) 0.005 % ophthalmic solution Place 1 drop into both eyes at bedtime.   Levothyroxine Sodium (TIROSINT) 50 MCG CAPS TAKE 1 CAPSULE DAILY BEFORE BREAKFAST   losartan (COZAAR) 25 MG tablet Take 0.5 tablets (12.5 mg total) by mouth daily.   nabumetone (RELAFEN) 750 MG tablet TAKE 1 TABLET TWICE A DAY   nitroGLYCERIN (NITROSTAT) 0.4 MG SL tablet Place 1 tablet (0.4 mg total) under the tongue every 5 (five) minutes as needed for chest pain.   polyethylene glycol (MIRALAX / GLYCOLAX) 17 g packet Take 17 g by mouth daily as needed for mild constipation.   simvastatin  (ZOCOR) 20 MG tablet Take 1 tablet (20 mg total) by mouth at bedtime.   Timolol Maleate, Once-Daily, 0.5 % SOLN Apply 1 drop to eye daily. Left eye every morning   VESICARE 5 MG tablet Take 5 mg by mouth daily.     Allergies:   Shingrix [zoster vac recomb adjuvanted]   Social History   Socioeconomic History   Marital status: Married    Spouse name: Not on file   Number of children: Not on file   Years of education: Not on file   Highest education level: Not on file  Occupational History   Occupation: retired AEngineer, [employer]at [employer]   Smoking status: Former    Packs/day: 0.50    Years: 5.00    Total pack years: 2.50    Types: Cigarettes, Pipe    Quit date: 09/04/1969    Years since quitting: 53.0   Smokeless tobacco: Never  Vaping Use   Vaping Use: Never used  Substance and Sexual Activity   Alcohol use: Yes    Alcohol/week: 14.0 standard drinks  of alcohol    Types: 14 Shots of liquor per week    Comment: 2 drinks per day   Drug use: No   Sexual activity: Not Currently  Other Topics Concern   Not on file  Social History Narrative   Lives at [facility] since 05/2010   Married [name]    Has living will, POA   Retired from [employer] after 29 years   Stopped smoking 1970   Exercise run 1 1/2 mile every other day, swim 15 minutes every other day, use machines in gym   Alcohol 2 drinks daily   Social Determinants of Health   Financial Resource Strain: Low Risk  (02/18/2018)   Overall Financial Resource Strain (CARDIA)    Difficulty of Paying Living Expenses: Not hard at all  Food Insecurity: No Food Insecurity (02/18/2018)   Hunger Vital Sign    Worried About Running Out of Food in the Last Year: Never true    [place] in the Last Year: Never true  Transportation Needs: No Transportation Needs (02/18/2018)   PRAPARE - Hydrologist (Medical): No    Lack of Transportation (Non-Medical): No  Physical Activity:  Insufficiently Active (02/18/2018)   Exercise Vital Sign    Days of Exercise per Week: 4 days    Minutes of Exercise per Session: 30 min  Stress: Stress Concern Present (02/18/2018)   [place]    Feeling of Stress : To some extent  Social Connections: Moderately Integrated (02/18/2018)   Social Connection and Isolation Panel [NHANES]    Frequency of Communication with Friends and Family: More than three times a week    Frequency of Social Gatherings with Friends and Family: More than three times a week    Attends Religious Services: More than 4 times per year    Active Member of [employer] or Organizations: No    Attends Archivist Meetings: Never    Marital Status: Married     Family History: The patient's family history includes Heart disease in his father and mother.  ROS:   Please see the history of present illness.    All other systems reviewed and are negative.  EKGs/Labs/Other Studies Reviewed:    EKG:  EKG ordered today, 09/17/22. The ekg ordered today demonstrates Sinus rhythm with a rate of 72bpm, First degree AV block, and PR interval of 256m.  EKG 08/03/2020: paroxysmal atrial tachycardia rate 115 bpm, left anterior fascicular block chronic.  Recent Labs: 10/24/2021: ALT 23 04/26/2022: TSH 5.46 06/18/2022: BUN 19; Creatinine 1.3; Potassium 4.8; Sodium 139 07/24/2022: Hemoglobin 11.0; Platelets 237  Recent Lipid Panel    Component Value Date/Time   CHOL 160 03/06/2022 0000   TRIG 70 03/06/2022 0000   HDL 59 03/06/2022 0000   LDLCALC 88 03/06/2022 0000     Risk Assessment/Calculations:       Physical Exam:    VS:  BP 130/60   Pulse 72   Ht '5\' 7"'$  (1.702 m)   Wt 168 lb 9.6 oz (76.5 kg)   SpO2 97%   BMI 26.41 kg/m     Wt Readings from Last 3 Encounters:  09/17/22 168 lb 9.6 oz (76.5 kg)  07/23/22 164 lb (74.4 kg)  06/22/22 160 lb (72.6 kg)     GEN:  Well nourished, well developed in  no acute distress HEENT: Normal NECK: No JVD; No carotid bruits LYMPHATICS: No lymphadenopathy CARDIAC: RRR, no murmurs,  rubs, or gallops RESPIRATORY:  Clear to auscultation without rales, wheezing or rhonchi  ABDOMEN: Soft, non-tender, non-distended MUSCULOSKELETAL:  No edema; No deformity  SKIN: Warm and dry NEUROLOGIC:  Alert and oriented x 3 PSYCHIATRIC:  Normal affect   ASSESSMENT:    1. Paroxysmal atrial fibrillation (HCC)   2. Essential hypertension   3. Atrial fibrillation, unspecified type ([place])   4. S/P minimally invasive mitral valve repair     PLAN:    In order of problems listed above:  Paroxysmal atrial fibrillation - Today's EKG shows sinus rhythm with first-degree AV block 252 ms.  Prior EKG is demonstrating heart rate of 115 bpm looks like possible sinus tachycardia.  I only see a small P waves preceding each QRS complex.  Unusual heart rate however.  Could be some atrial tachycardia.  Overall feeling well.  No strokes.  No changes with medical management.  Consider aspirin every other day.  Feels well.  No palpitations.  Left anterior fascicular block -Chronic.  No symptoms  Mitral valve repair -Dr. Roxy Manns.  Dental prophylaxis.  2017  Essential hypertension -Excellent.  On losartan 12.5 mg a day.  Excellent blood pressure control.  No changes made with medical management.  Mild carotid plaque noted.  We talked previously about aspirin prophylaxis.  He could consider every other day aspirin.  Follow up: 1 year  Medication Adjustments/Labs and Tests Ordered: Current medicines are reviewed at length with the patient today.  Concerns regarding medicines are outlined above.  Orders Placed This Encounter  Procedures   EKG 12-Lead   No orders of the defined types were placed in this encounter.   Patient Instructions  Medication Instructions:  No changes. *If you need a refill on your cardiac medications before your next appointment, please call your  pharmacy*   Lab Work: None.   Testing/Procedures: None.   Follow-Up: At [facility], you and your health needs are our priority.  As part of our continuing mission to provide you with exceptional heart care, we have created designated Provider Care Teams.  These Care Teams include your primary Cardiologist (physician) and Advanced Practice Providers (APPs -  Physician Assistants and Nurse Practitioners) who all work together to provide you with the care you need, when you need it.  We recommend signing up for the patient portal called "MyChart".  Sign up information is provided on this After Visit Summary.  MyChart is used to connect with patients for Virtual Visits (Telemedicine).  Patients are able to view lab/test results, encounter notes, upcoming appointments, etc.  Non-urgent messages can be sent to your provider as well.   To learn more about what you can do with MyChart, go to [email or web address].    Your next appointment:   1 year(s)  The format for your next appointment:   In Person  Provider:   Candee Furbish, MD      Important Information About Sugar         Signed, Candee Furbish, MD  09/17/2022 10:46 AM    [place] as a scribe for Candee Furbish, MD.,have documented all relevant documentation on the behalf of Candee Furbish, MD,as directed by  Candee Furbish, MD while in the presence of Candee Furbish, MD.  I, Candee Furbish, MD, have reviewed all documentation for this visit. The documentation on 09/17/22 for the exam, diagnosis, procedures, and orders are all accurate and complete.

## 2022-09-17 NOTE — Patient Instructions (Signed)
Medication Instructions:  No changes. *If you need a refill on your cardiac medications before your next appointment, please call your pharmacy*   Lab Work: None.   Testing/Procedures: None.   Follow-Up: At [facility], you and your health needs are our priority.  As part of our continuing mission to provide you with exceptional heart care, we have created designated Provider Care Teams.  These Care Teams include your primary Cardiologist (physician) and Advanced Practice Providers (APPs -  Physician Assistants and Nurse Practitioners) who all work together to provide you with the care you need, when you need it.  We recommend signing up for the patient portal called "MyChart".  Sign up information is provided on this After Visit Summary.  MyChart is used to connect with patients for Virtual Visits (Telemedicine).  Patients are able to view lab/test results, encounter notes, upcoming appointments, etc.  Non-urgent messages can be sent to your provider as well.   To learn more about what you can do with MyChart, go to [email or web address].    Your next appointment:   1 year(s)  The format for your next appointment:   In Person  Provider:   Candee Furbish, MD      Important Information About Sugar

## 2022-11-19 DIAGNOSIS — H401422 Capsular glaucoma with pseudoexfoliation of lens, left eye, moderate stage: Secondary | ICD-10-CM | POA: Diagnosis not present

## 2022-11-19 DIAGNOSIS — H401112 Primary open-angle glaucoma, right eye, moderate stage: Secondary | ICD-10-CM | POA: Diagnosis not present

## 2022-11-22 ENCOUNTER — Other Ambulatory Visit: Payer: Self-pay | Admitting: Internal Medicine

## 2023-01-03 ENCOUNTER — Other Ambulatory Visit: Payer: Self-pay | Admitting: Internal Medicine

## 2023-01-17 DIAGNOSIS — E02 Subclinical iodine-deficiency hypothyroidism: Secondary | ICD-10-CM | POA: Diagnosis not present

## 2023-01-17 DIAGNOSIS — D631 Anemia in chronic kidney disease: Secondary | ICD-10-CM | POA: Diagnosis not present

## 2023-01-17 DIAGNOSIS — D500 Iron deficiency anemia secondary to blood loss (chronic): Secondary | ICD-10-CM | POA: Diagnosis not present

## 2023-01-17 LAB — LAB REPORT - SCANNED: EGFR: 48

## 2023-01-17 LAB — COMPREHENSIVE METABOLIC PANEL: Calcium: 9.1 (ref 8.7–10.7)

## 2023-01-17 LAB — BASIC METABOLIC PANEL
CO2: 24 — AB (ref 13–22)
Chloride: 103 (ref 99–108)
Creatinine: 1.4 — AB (ref 0.6–1.3)
Glucose: 92
Potassium: 4.5 mEq/L (ref 3.5–5.1)
Sodium: 137 (ref 137–147)

## 2023-01-17 LAB — TSH: TSH: 5.41 (ref 0.41–5.90)

## 2023-01-17 LAB — LIPID PANEL
Cholesterol: 166 (ref 0–200)
HDL: 61 (ref 35–70)
LDL Cholesterol: 87
Triglycerides: 88 (ref 40–160)

## 2023-01-22 ENCOUNTER — Non-Acute Institutional Stay: Payer: Medicare Other | Admitting: Internal Medicine

## 2023-01-22 ENCOUNTER — Encounter: Payer: Self-pay | Admitting: Internal Medicine

## 2023-01-22 VITALS — BP 118/72 | HR 81 | Temp 97.9°F | Resp 17 | Ht 67.0 in | Wt 162.8 lb

## 2023-01-22 DIAGNOSIS — I10 Essential (primary) hypertension: Secondary | ICD-10-CM | POA: Diagnosis not present

## 2023-01-22 DIAGNOSIS — N400 Enlarged prostate without lower urinary tract symptoms: Secondary | ICD-10-CM

## 2023-01-22 DIAGNOSIS — E785 Hyperlipidemia, unspecified: Secondary | ICD-10-CM | POA: Diagnosis not present

## 2023-01-22 DIAGNOSIS — M159 Polyosteoarthritis, unspecified: Secondary | ICD-10-CM | POA: Diagnosis not present

## 2023-01-22 DIAGNOSIS — N1831 Chronic kidney disease, stage 3a: Secondary | ICD-10-CM | POA: Diagnosis not present

## 2023-01-22 DIAGNOSIS — Z96652 Presence of left artificial knee joint: Secondary | ICD-10-CM

## 2023-01-22 DIAGNOSIS — H6123 Impacted cerumen, bilateral: Secondary | ICD-10-CM | POA: Diagnosis not present

## 2023-01-22 DIAGNOSIS — E039 Hypothyroidism, unspecified: Secondary | ICD-10-CM | POA: Diagnosis not present

## 2023-01-22 NOTE — Progress Notes (Signed)
Location:  [facility] features editor of Service:  Clinic (12)  Provider:   Code Status:  Goals of Care:     01/22/2023    1:54 PM  Advanced Directives  Does Patient Have a Medical Advance Directive? Yes  Type of Estate agent of [place];Living will  Does patient want to make changes to medical advance directive? No - Patient declined  Copy of Healthcare Power of Attorney in Chart? No - copy requested     Chief Complaint  Patient presents with   Medical Management of Chronic Issues    6 month follow up with labs     HPI: Patient is a 89 y.o. male seen today for medical management of chronic diseases.   [place]ives in [place]  Patient also  He has h/o Arthritis Takes Relafen for past 8-9 years. Tapering has not helped LE edema  BPH with Frequency  Recent Inguinal Hernia repair  H/o Right Shoulder Arthroplasty MVR repair CKD Stage 3 A Left TKA 06/08/22  Doing well Had no complains Does his exercises and Swims Walks now with no assist    Past Medical History:  Diagnosis Date   Aortoiliac occlusive disease    Carotid artery disease    Complication of anesthesia 1955   sodium pentathol ?, N/V   Contracture of palmar fascia 06/21/2010   Dysrhythmia    PAF   Enthesopathy of hip region 11/27/2009   Essential hypertension, benign 06/21/2010   GERD (gastroesophageal reflux disease)    occ   Head injury with loss of consciousness 2001   short period   Heart murmur    Hyperlipidemia LDL goal <100 06/21/2010   Hypertrophy of prostate without urinary obstruction and other lower urinary tract symptoms (LUTS) 06/21/2010   Hypothyroidism 08/27/2011   Malignant neoplasm of prostate 06/21/2010   Mitral regurgitation 10/07/2015   Severe MR noted on ECHO with partially flail post leaflet Oct 03 2015    Osteoarthrosis, unspecified whether generalized or localized, unspecified site 06/21/2010   Other and unspecified hyperlipidemia 06/21/2010    Pain in joint, lower leg 02/25/2012   Palpitations 08/14/2010   Patent foramen ovale 01/04/2016   Closed at the time of mitral valve repair    PONV (postoperative nausea and vomiting)    S/P minimally invasive mitral valve repair 01/04/2016   Complex valvuloplasty including triangular resection of flail segment of posterior leaflet, artificial Gore-tex neochord placement x6 and 32mm Sorin Memo 3D ring annuloplasty via right mini thoracotomy approach with closure of PFO and clipping of LA appendage    Unspecified constipation 06/21/2010   Unspecified essential hypertension 06/21/2010   Unspecified hypothyroidism 08/27/2011   Urinary frequency 08/14/2008    Past Surgical History:  Procedure Laterality Date   CARDIAC CATHETERIZATION N/A 11/10/2015   Procedure: Right/Left Heart Cath and Coronary Angiography;  Surgeon: Peter M Swaziland, MD;  Location: [facility]VAS[facility] CV LAB;  Service: Cardiovascular;  Laterality: N/A;   CATARACT EXTRACTION W/ INTRAOCULAR LENS  IMPLANT, BILATERAL  2006   CLIPPING OF ATRIAL APPENDAGE N/A 01/04/2016   Procedure: CLIPPING OF ATRIAL APPENDAGE;  Surgeon: Purcell Nails, MD;  Location: [facility] OR;  Service: Open Heart Surgery;  Laterality: N/A;   COLONOSCOPY     CORONARY ANGIOPLASTY  2017   Dr Swaziland   INGUINAL HERNIA REPAIR Left 12/19/2020   Procedure: LEFT OPEN INGUINAL HERNIA REPAIR WITHOUT MESH;  Surgeon: Kinsinger, De Blanch, MD;  Location: [facility];  Service: General;  Laterality: Left;  ROOM 3 STARTING AT 11:30AM FOR 60 MIN   KNEE ARTHROSCOPY WITH MENISCAL REPAIR Left 1968   MITRAL VALVE REPAIR Right 01/04/2016   Procedure: MINIMALLY INVASIVE MITRAL VALVE REPAIR (MVR);  Surgeon: Purcell Nails, MD;  Location: [facility] OR;  Service: Open Heart Surgery;  Laterality: Right;   Open knee- ligament repair Right 1955   Dr. Sandra Cockayne   PROSTATE SURGERY  2000   seed implants   REVERSE SHOULDER ARTHROPLASTY Right 08/12/2020   Procedure: REVERSE SHOULDER ARTHROPLASTY;   Surgeon: Francena Hanly, MD;  Location: [facility] OR;  Service: Orthopedics;  Laterality: Right;    TEE WITHOUT CARDIOVERSION N/A 11/10/2015   Procedure: TRANSESOPHAGEAL ECHOCARDIOGRAM (TEE);  Surgeon: Chilton Si, MD;  Location: [facility] ENDOSCOPY;  Service: Cardiovascular;  Laterality: N/A;   TEE WITHOUT CARDIOVERSION N/A 01/04/2016   Procedure: TRANSESOPHAGEAL ECHOCARDIOGRAM (TEE);  Surgeon: Purcell Nails, MD;  Location: [facility] OR;  Service: Open Heart Surgery;  Laterality: N/A;   TOTAL KNEE ARTHROPLASTY Left 06/08/2022   Procedure: LEFT TOTAL KNEE ARTHROPLASTY;  Surgeon: Beverely Low, MD;  Location: [facility] ORS;  Service: Orthopedics;  Laterality: Left;    Allergies  Allergen Reactions   Shingrix [Zoster Vac Recomb Adjuvanted]     Outpatient Encounter Medications as of 01/22/2023  Medication Sig   acetaminophen (TYLENOL) 325 MG tablet Take 650 mg by mouth every 4 (four) hours as needed.   alfuzosin (UROXATRAL) 10 MG 24 hr tablet Take 1 tablet (10 mg total) by mouth daily with breakfast.   amoxicillin (AMOXIL) 250 MG chewable tablet Chew 500 mg by mouth as needed. 2000mg  1x one hour prior to dental procedures.   dupilumab (DUPIXENT) 300 MG/2ML prefilled syringe Inject 300 mg into the skin every 14 (fourteen) days.   DUPIXENT 300 MG/2ML SOPN INJECT 300 MG UNDER THE SKIN EVERY 14 DAYS   influenza vaccine adjuvanted (FLUAD QUADRIVALENT) 0.5 ML injection Inject into the muscle.   latanoprost (XALATAN) 0.005 % ophthalmic solution Place 1 drop into both eyes at bedtime.   Levothyroxine Sodium (TIROSINT) 50 MCG CAPS TAKE 1 CAPSULE DAILY BEFORE BREAKFAST   losartan (COZAAR) 25 MG tablet Take 0.5 tablets (12.5 mg total) by mouth daily.   nabumetone (RELAFEN) 750 MG tablet TAKE 1 TABLET TWICE A DAY   nitroGLYCERIN (NITROSTAT) 0.4 MG SL tablet Place 1 tablet (0.4 mg total) under the tongue every 5 (five) minutes as needed for chest pain.   polyethylene glycol (MIRALAX / GLYCOLAX) 17 g packet Take 17 g by  mouth daily as needed for mild constipation.   simvastatin (ZOCOR) 20 MG tablet Take 1 tablet (20 mg total) by mouth at bedtime.   Timolol Maleate, Once-Daily, 0.5 % SOLN Apply 1 drop to eye daily. Left eye every morning   VESICARE 5 MG tablet Take 5 mg by mouth daily.   No facility-administered encounter medications on file as of 01/22/2023.    Review of Systems:  Review of Systems  Constitutional:  Negative for activity change, appetite change and unexpected weight change.  HENT: Negative.    Respiratory:  Negative for cough and shortness of breath.   Cardiovascular:  Negative for leg swelling.  Gastrointestinal:  Negative for constipation.  Genitourinary:  Negative for frequency.  Musculoskeletal:  Negative for arthralgias, gait problem and myalgias.  Skin: Negative.  Negative for rash.  Neurological:  Negative for dizziness and weakness.  Psychiatric/Behavioral:  Negative for confusion and sleep disturbance.   All other systems reviewed and are negative.   Health Maintenance  Topic Date Due   COVID-19 Vaccine (5 - 2023-24 season) 02/07/2023 (Originally 06/22/2022)   Medicare Annual Wellness (AWV)  02/21/2023 (Originally 08/26/2021)   INFLUENZA VACCINE  05/23/2023   DTaP/Tdap/Td (3 - Td or Tdap) 02/27/2025   Pneumonia Vaccine 34+ Years old  Completed   Zoster Vaccines- Shingrix  Completed   HPV VACCINES  Aged Out    Physical Exam: Vitals:   01/22/23 1351  BP: 118/72  Pulse: 81  Resp: 17  Temp: 97.9 F (36.6 C)  TempSrc: Temporal  SpO2: 96%  Weight: 162 lb 12.8 oz (73.8 kg)  Height: 5\' 7"  (1.702 m)   Body mass index is 25.5 kg/m. Physical Exam Vitals reviewed.  Constitutional:      Appearance: Normal appearance.  HENT:     Head: Normocephalic.     Right Ear: Tympanic membrane normal.     Left Ear: Tympanic membrane normal.     Ears:     Comments: Wax in both ears    Nose: Nose normal.     Mouth/Throat:     Mouth: Mucous membranes are moist.     Pharynx:  Oropharynx is clear.  Eyes:     Pupils: Pupils are equal, round, and reactive to light.  Cardiovascular:     Rate and Rhythm: Normal rate and regular rhythm.     Pulses: Normal pulses.     Heart sounds: No murmur heard. Pulmonary:     Effort: Pulmonary effort is normal. No respiratory distress.     Breath sounds: Normal breath sounds. No rales.  Abdominal:     General: Abdomen is flat. Bowel sounds are normal.     Palpations: Abdomen is soft.  Musculoskeletal:        General: No swelling.     Cervical back: Neck supple.  Skin:    General: Skin is warm.  Neurological:     General: No focal deficit present.     Mental Status: He is alert and oriented to person, place, and time.  Psychiatric:        Mood and Affect: Mood normal.        Thought Content: Thought content normal.     Labs reviewed: Basic Metabolic Panel: Recent Labs    03/06/22 0000 04/26/22 0000 04/26/22 0000 06/01/22 1041 06/09/22 0350 06/18/22 0000 01/17/23 0000  NA 142 137   < > 139 137 139 137  K 4.5 4.8  --  4.3 4.0 4.8 4.5  CL 105 103  --  107 108 104 103  CO2 23* 25*  --  26 24 26* 24*  GLUCOSE  --   --   --  79 108*  --   --   BUN 27* 22*  --  28* 20 19  --   CREATININE 1.6* 1.5*   < > 1.70* 1.33* 1.3 1.4*  CALCIUM 8.7 9.2  --  9.2 8.1* 8.6* 9.1  TSH 4.45 5.46  --   --   --   --  5.41   < > = values in this interval not displayed.   Liver Function Tests: No results for input(s): "AST", "ALT", "ALKPHOS", "BILITOT", "PROT", "ALBUMIN" in the last 8760 hours. No results for input(s): "LIPASE", "AMYLASE" in the last 8760 hours. No results for input(s): "AMMONIA" in the last 8760 hours. CBC: Recent Labs    06/01/22 1041 06/09/22 0350 07/24/22 0000  WBC 4.4 8.0 4.1  HGB 12.4* 10.1* 11.0*  HCT 37.5* 30.1* 32*  MCV 93.3 92.9  --  PLT 221 178 237   Lipid Panel: Recent Labs    03/06/22 0000 01/17/23 0000  CHOL 160 166  HDL 59 61  LDLCALC 88 87  TRIG 70 88   Lab Results  Component  Value Date   HGBA1C 5.5 01/02/2016    Procedures since last visit: No results found.  Assessment/Plan 1. Primary osteoarthritis involving multiple joints Takes Relafen I suggested him to try taking it Every other day  2. Hypothyroidism, unspecified type TSH normal  3. S/P total knee arthroplasty, left Doing well  4. Hyperlipidemia LDL goal <100 On Statin  5. Bilateral impacted cerumen Talked ot Facility Nurse for Ear wash 6 Hypertension On Cozaar  7 BPH On Uroxatral and vesicare 8 Dermatitis DUpixent   Labs/tests ordered:  CBC,CMP,TSH Lipid Next visit Next appt:  Visit date not found

## 2023-02-12 ENCOUNTER — Other Ambulatory Visit: Payer: Self-pay | Admitting: Internal Medicine

## 2023-03-06 DIAGNOSIS — Z85828 Personal history of other malignant neoplasm of skin: Secondary | ICD-10-CM | POA: Diagnosis not present

## 2023-03-06 DIAGNOSIS — C44319 Basal cell carcinoma of skin of other parts of face: Secondary | ICD-10-CM | POA: Diagnosis not present

## 2023-03-28 ENCOUNTER — Other Ambulatory Visit: Payer: Self-pay | Admitting: Adult Health

## 2023-04-26 ENCOUNTER — Encounter (HOSPITAL_BASED_OUTPATIENT_CLINIC_OR_DEPARTMENT_OTHER): Payer: Self-pay | Admitting: Emergency Medicine

## 2023-04-26 ENCOUNTER — Emergency Department (HOSPITAL_BASED_OUTPATIENT_CLINIC_OR_DEPARTMENT_OTHER): Payer: Medicare Other | Admitting: Radiology

## 2023-04-26 ENCOUNTER — Emergency Department (HOSPITAL_BASED_OUTPATIENT_CLINIC_OR_DEPARTMENT_OTHER)
Admission: EM | Admit: 2023-04-26 | Discharge: 2023-04-26 | Disposition: A | Discharge status: Home / Self Care | Payer: Medicare Other | Attending: Emergency Medicine | Admitting: Emergency Medicine

## 2023-04-26 ENCOUNTER — Other Ambulatory Visit (HOSPITAL_BASED_OUTPATIENT_CLINIC_OR_DEPARTMENT_OTHER): Payer: Self-pay

## 2023-04-26 ENCOUNTER — Other Ambulatory Visit: Payer: Self-pay

## 2023-04-26 DIAGNOSIS — I2510 Atherosclerotic heart disease of native coronary artery without angina pectoris: Secondary | ICD-10-CM | POA: Diagnosis not present

## 2023-04-26 DIAGNOSIS — J984 Other disorders of lung: Secondary | ICD-10-CM | POA: Diagnosis not present

## 2023-04-26 DIAGNOSIS — F17210 Nicotine dependence, cigarettes, uncomplicated: Secondary | ICD-10-CM | POA: Diagnosis not present

## 2023-04-26 DIAGNOSIS — Z96652 Presence of left artificial knee joint: Secondary | ICD-10-CM | POA: Insufficient documentation

## 2023-04-26 DIAGNOSIS — S2242XA Multiple fractures of ribs, left side, initial encounter for closed fracture: Secondary | ICD-10-CM | POA: Insufficient documentation

## 2023-04-26 DIAGNOSIS — E039 Hypothyroidism, unspecified: Secondary | ICD-10-CM | POA: Insufficient documentation

## 2023-04-26 DIAGNOSIS — S43402A Unspecified sprain of left shoulder joint, initial encounter: Secondary | ICD-10-CM | POA: Insufficient documentation

## 2023-04-26 DIAGNOSIS — I480 Paroxysmal atrial fibrillation: Secondary | ICD-10-CM | POA: Diagnosis not present

## 2023-04-26 DIAGNOSIS — I700 Atherosclerosis of aorta: Secondary | ICD-10-CM | POA: Diagnosis not present

## 2023-04-26 DIAGNOSIS — M8580 Other specified disorders of bone density and structure, unspecified site: Secondary | ICD-10-CM | POA: Diagnosis not present

## 2023-04-26 DIAGNOSIS — W11XXXA Fall on and from ladder, initial encounter: Secondary | ICD-10-CM | POA: Insufficient documentation

## 2023-04-26 DIAGNOSIS — N1831 Chronic kidney disease, stage 3a: Secondary | ICD-10-CM | POA: Insufficient documentation

## 2023-04-26 DIAGNOSIS — I129 Hypertensive chronic kidney disease with stage 1 through stage 4 chronic kidney disease, or unspecified chronic kidney disease: Secondary | ICD-10-CM | POA: Insufficient documentation

## 2023-04-26 DIAGNOSIS — Z8546 Personal history of malignant neoplasm of prostate: Secondary | ICD-10-CM | POA: Diagnosis not present

## 2023-04-26 DIAGNOSIS — M19032 Primary osteoarthritis, left wrist: Secondary | ICD-10-CM | POA: Diagnosis not present

## 2023-04-26 DIAGNOSIS — S2232XA Fracture of one rib, left side, initial encounter for closed fracture: Secondary | ICD-10-CM | POA: Diagnosis not present

## 2023-04-26 DIAGNOSIS — S62115A Nondisplaced fracture of triquetrum [cuneiform] bone, left wrist, initial encounter for closed fracture: Secondary | ICD-10-CM | POA: Diagnosis not present

## 2023-04-26 DIAGNOSIS — M79622 Pain in left upper arm: Secondary | ICD-10-CM | POA: Diagnosis not present

## 2023-04-26 DIAGNOSIS — S20302A Unspecified superficial injuries of left front wall of thorax, initial encounter: Secondary | ICD-10-CM | POA: Diagnosis present

## 2023-04-26 MED ORDER — LIDOCAINE 5 % EX PTCH: 1.0000 | MEDICATED_PATCH | Freq: Every day | CUTANEOUS | 0 refills | Status: DC | PRN

## 2023-04-26 MED ORDER — CYCLOBENZAPRINE HCL 5 MG PO TABS: 5.0000 mg | ORAL_TABLET | Freq: Two times a day (BID) | ORAL | 0 refills | Status: DC | PRN

## 2023-04-26 MED ORDER — LIDOCAINE 5 % EX PTCH
1.0000 | MEDICATED_PATCH | Freq: Once | CUTANEOUS | Status: DC
  Administered 2023-04-26: 1 via TRANSDERMAL
  Filled 2023-04-26: qty 1

## 2023-04-26 MED ORDER — ACETAMINOPHEN 325 MG PO TABS: 650.0000 mg | ORAL_TABLET | Freq: Four times a day (QID) | ORAL | 0 refills | Status: AC | PRN

## 2023-04-26 NOTE — ED Provider Notes (Addendum)
[place] EMERGENCY DEPARTMENT AT [facility] Provider Note  CSN: [id number] Arrival date & time: 04/26/23 4098  Chief Complaint(s) Fall  HPI Samuel [name] is a 89 y.o. male with past medical history as below, significant for CAD,, PAF, HTN, GERD, HLD, mitral regurg, mitral valve repair, prostate cancer who presents to the ED with complaint of fall.  Patient resides at independent nursing facility, 3 days ago he was on a 2-3 step stepladder and lost his footing on the way back down and fell to the ground.  Fell onto his left side.  No head injury, no LOC.  Experienced immediate pain to his left wrist and left upper arm, left chest wall.  Has been having weakness to his left arm and pain primarily to his proximal humerus over the past couple days.  Adamantly denies head injury or LOC.  He has been ambulatory since the event.  No difficulty breathing, nausea or vomiting, no vision or hearing changes, no numbness or tingling.  Reduced range of motion to left upper extremity secondary to pain.  Past Medical History Past Medical History:  Diagnosis Date   Aortoiliac occlusive disease (HCC)    Carotid artery disease (HCC)    Complication of anesthesia 1955   sodium pentathol ?, N/V   Contracture of palmar fascia 06/21/2010   Dysrhythmia    PAF   Enthesopathy of hip region 11/27/2009   Essential hypertension, benign 06/21/2010   GERD (gastroesophageal reflux disease)    occ   Head injury with loss of consciousness (HCC) 2001   short period   Heart murmur    Hyperlipidemia LDL goal <100 06/21/2010   Hypertrophy of prostate without urinary obstruction and other lower urinary tract symptoms (LUTS) 06/21/2010   Hypothyroidism 08/27/2011   Malignant neoplasm of prostate (HCC) 06/21/2010   Mitral regurgitation 10/07/2015   Severe MR noted on ECHO with partially flail post leaflet Oct 03 2015    Osteoarthrosis, unspecified whether generalized or localized, unspecified site 06/21/2010    Other and unspecified hyperlipidemia 06/21/2010   Pain in joint, lower leg 02/25/2012   Palpitations 08/14/2010   Patent foramen ovale 01/04/2016   Closed at the time of mitral valve repair    PONV (postoperative nausea and vomiting)    S/P minimally invasive mitral valve repair 01/04/2016   Complex valvuloplasty including triangular resection of flail segment of posterior leaflet, artificial Gore-tex neochord placement x6 and 32mm Sorin Memo 3D ring annuloplasty via right mini thoracotomy approach with closure of PFO and clipping of LA appendage    Unspecified constipation 06/21/2010   Unspecified essential hypertension 06/21/2010   Unspecified hypothyroidism 08/27/2011   Urinary frequency 08/14/2008   Patient Active Problem List   Diagnosis Date Noted   Stage 3a chronic kidney disease (HCC) 06/22/2022   H/O total knee replacement, left 06/08/2022   History of reverse total replacement of right shoulder joint 09/14/2020   Atopic dermatitis 06/25/2019   Chronic aorto-iliac occlusion syndrome (HCC) 05/07/2018   Caregiver stress 04/17/2017   Allergic drug reaction 04/17/2017   S/P minimally invasive mitral valve repair 01/04/2016   Paroxysmal atrial fibrillation (HCC) 01/04/2016   Carotid artery disease (HCC)    Hypothyroidism 08/27/2011   Hyperlipidemia LDL goal <100 06/21/2010   Essential hypertension, benign 06/21/2010   Contracture of palmar fascia 06/21/2010   Enthesopathy of hip region 11/27/2009   Home Medication(s) Prior to Admission medications   Medication Sig Start Date End Date Taking? Authorizing Provider  acetaminophen (TYLENOL) 325 MG tablet  Take 2 tablets (650 mg total) by mouth every 6 (six) hours as needed. 04/26/23  Yes Tanda Rockers A, DO  alfuzosin (UROXATRAL) 10 MG 24 hr tablet TAKE 1 TABLET DAILY WITH BREAKFAST 03/28/23   Mahlon Gammon, MD  cyclobenzaprine (FLEXERIL) 5 MG tablet Take 1 tablet (5 mg total) by mouth 2 (two) times daily as needed for muscle spasms. 04/26/23   Yes Tanda Rockers A, DO  lidocaine (LIDODERM) 5 % Place 1 patch onto the skin daily as needed. Remove & Discard patch within 12 hours or as directed by MD 04/26/23  Yes Sloan Leiter, DO  acetaminophen (TYLENOL) 325 MG tablet Take 650 mg by mouth every 4 (four) hours as needed.    [provider]  amoxicillin (AMOXIL) 250 MG chewable tablet Chew 500 mg by mouth as needed. 2000mg  1x one hour prior to dental procedures.    [provider]  dupilumab (DUPIXENT) 300 MG/2ML prefilled syringe Inject 300 mg into the skin every 14 (fourteen) days.    [provider]  DUPIXENT 300 MG/2ML SOPN INJECT 300 MG UNDER THE SKIN EVERY 14 DAYS 11/23/22   Fletcher Anon, NP  influenza vaccine adjuvanted (FLUAD QUADRIVALENT) 0.5 ML injection Inject into the muscle. 07/23/22   Judyann Munson, MD  latanoprost (XALATAN) 0.005 % ophthalmic solution Place 1 drop into both eyes at bedtime.    [provider]  Levothyroxine Sodium (TIROSINT) 50 MCG CAPS TAKE 1 CAPSULE DAILY BEFORE BREAKFAST 02/12/23   Mahlon Gammon, MD  losartan (COZAAR) 25 MG tablet Take 0.5 tablets (12.5 mg total) by mouth daily. 09/20/21   Dyann Kief, PA-C  nabumetone (RELAFEN) 750 MG tablet TAKE 1 TABLET TWICE A DAY 01/03/23   Mahlon Gammon, MD  nitroGLYCERIN (NITROSTAT) 0.4 MG SL tablet Place 1 tablet (0.4 mg total) under the tongue every 5 (five) minutes as needed for chest pain. 09/20/21   Dyann Kief, PA-C  polyethylene glycol (MIRALAX / GLYCOLAX) 17 g packet Take 17 g by mouth daily as needed for mild constipation.    [provider]  simvastatin (ZOCOR) 20 MG tablet Take 1 tablet (20 mg total) by mouth at bedtime. 08/22/22   Mahlon Gammon, MD  Timolol Maleate, Once-Daily, 0.5 % SOLN Apply 1 drop to eye daily. Left eye every morning    [provider]  VESICARE 5 MG tablet Take 5 mg by mouth daily. 08/09/16   [provider]                                                                                                                                     Past Surgical History Past Surgical History:  Procedure Laterality Date   CARDIAC CATHETERIZATION N/A 11/10/2015   Procedure: Right/Left Heart Cath and Coronary Angiography;  Surgeon: Peter M Swaziland, MD;  Location: [facility] INVASIVE CV LAB;  Service: Cardiovascular;  Laterality: N/A;   CATARACT  EXTRACTION W/ INTRAOCULAR LENS  IMPLANT, BILATERAL  2006   CLIPPING OF ATRIAL APPENDAGE N/A 01/04/2016   Procedure: CLIPPING OF ATRIAL APPENDAGE;  Surgeon: Purcell Nails, MD;  Location: [facility] OR;  Service: Open Heart Surgery;  Laterality: N/A;   COLONOSCOPY     CORONARY ANGIOPLASTY  2017   Dr Swaziland   INGUINAL HERNIA REPAIR Left 12/19/2020   Procedure: LEFT OPEN INGUINAL HERNIA REPAIR WITHOUT MESH;  Surgeon: Kinsinger, De Blanch, MD;  Location[facility];  Service: General;  Laterality: Left;  ROOM 3 STARTING AT 11:30AM FOR 60 MIN   KNEE ARTHROSCOPY WITH MENISCAL REPAIR Left 1968   MITRAL VALVE REPAIR Right 01/04/2016   Procedure: MINIMALLY INVASIVE MITRAL VALVE REPAIR (MVR);  Surgeon: Purcell Nails, MD;  Location: [facility] OR;  Service: Open Heart Surgery;  Laterality: Right;   Open knee- ligament repair Right 1955   Dr. Sandra Cockayne   PROSTATE SURGERY  2000   seed implants   REVERSE SHOULDER ARTHROPLASTY Right 08/12/2020   Procedure: REVERSE SHOULDER ARTHROPLASTY;  Surgeon: Francena Hanly, MD;  Location: [facility] OR;  Service: Orthopedics;  Laterality: Right;    TEE WITHOUT CARDIOVERSION N/A 11/10/2015   Procedure: TRANSESOPHAGEAL ECHOCARDIOGRAM (TEE);  Surgeon: Chilton Si, MD;  Location: [facility] ENDOSCOPY;  Service: Cardiovascular;  Laterality: N/A;   TEE WITHOUT CARDIOVERSION N/A 01/04/2016   Procedure: TRANSESOPHAGEAL ECHOCARDIOGRAM (TEE);  Surgeon: Purcell Nails, MD;  Location: [facility] OR;  Service: Open Heart Surgery;  Laterality: N/A;   TOTAL KNEE ARTHROPLASTY Left 06/08/2022   Procedure: LEFT TOTAL KNEE ARTHROPLASTY;   Surgeon: Beverely Low, MD;  Location: [facility] ORS;  Service: Orthopedics;  Laterality: Left;   Family History Family History  Problem Relation Age of Onset   Heart disease Mother        myocardial infarction   Heart disease Father        myocardial infarction    Social History Social History   Tobacco Use   Smoking status: Some Days    Packs/day: 0.50    Years: 5.00    Additional pack years: 0.00    Total pack years: 2.50    Types: Cigarettes, Pipe, Cigars    Last attempt to quit: 09/04/1969    Years since quitting: 53.6   Smokeless tobacco: Never  Vaping Use   Vaping Use: Never used  Substance Use Topics   Alcohol use: Yes    Alcohol/week: 14.0 standard drinks of alcohol    Types: 14 Shots of liquor per week    Comment: 2 drinks per day   Drug use: No   Allergies Shingrix [zoster vac recomb adjuvanted]  Review of Systems Review of Systems  Constitutional:  Negative for chills and fever.  HENT:  Negative for trouble swallowing.   Eyes:  Negative for visual disturbance.  Respiratory:  Negative for chest tightness and shortness of breath.   Cardiovascular:  Negative for chest pain and palpitations.  Gastrointestinal:  Negative for abdominal pain, diarrhea, nausea and vomiting.  Genitourinary:  Negative for difficulty urinating and dysuria.  Musculoskeletal:  Positive for arthralgias. Negative for gait problem.  Neurological:  Negative for syncope, numbness and headaches.  All other systems reviewed and are negative.   Physical Exam Vital Signs  I have reviewed the triage vital signs BP (!) 156/77   Pulse 72   Temp 97.9 F (36.6 C) (Axillary)   Resp 16   SpO2 94%  Physical Exam Vitals and nursing note reviewed.  Constitutional:  General: He is not in acute distress.    Appearance: He is well-developed.  HENT:     Head: Normocephalic and atraumatic. No raccoon eyes, Battle's sign, right periorbital erythema or left periorbital erythema.     Jaw: There is  normal jaw occlusion. No trismus.     Right Ear: External ear normal.     Left Ear: External ear normal.     Mouth/Throat:     Mouth: Mucous membranes are moist.  Eyes:     General: No scleral icterus. Cardiovascular:     Rate and Rhythm: Normal rate and regular rhythm.     Pulses: Normal pulses.     Heart sounds: Normal heart sounds.  Pulmonary:     Effort: Pulmonary effort is normal. No respiratory distress.     Breath sounds: Normal breath sounds.  Abdominal:     General: Abdomen is flat.     Palpations: Abdomen is soft.     Tenderness: There is no abdominal tenderness.  Musculoskeletal:       Arms:       Back:     Right lower leg: No edema.     Left lower leg: No edema.       Legs:     Comments: No midline spinous process tenderness to palpation or percussion, no crepitus or step-off.  .  Left upper extremity NVI to radial, median and ulnar nerve distributions.  Radial pulses intact bilateral  Skin:    General: Skin is warm and dry.     Capillary Refill: Capillary refill takes less than 2 seconds.  Neurological:     Mental Status: He is alert and oriented to person, place, and time.     GCS: GCS eye subscore is 4. GCS verbal subscore is 5. GCS motor subscore is 6.  Psychiatric:        Mood and Affect: Mood normal.        Behavior: Behavior normal.     ED Results and Treatments Labs (all labs ordered are listed, but only abnormal results are displayed) Labs Reviewed - No data to display                                                                                                                        Radiology DG Ribs Unilateral W/Chest Left  Result Date: 04/26/2023 CLINICAL DATA:  Status post fall from ladder. EXAM: LEFT RIBS AND CHEST - 3+ VIEW COMPARISON:  CT 01/23/2016 FINDINGS: Heart size normal. Status post mitral valve repair and left atrial clipping. Chronic pleuroparenchymal scarring identified within the lung bases. No signs of pleural effusion,  airspace consolidation, atelectasis, or pneumothorax. Chronic coarsened interstitial markings as noted previously. Multiple remote healed right posterior rib fracture deformities appears similar to previous exam. Fractures involving the anterior aspect of the left fifth and sixth ribs are identified. Previous right shoulder reverse arthroplasty. IMPRESSION: 1. Acute fractures involving the anterior aspect of the left fifth and sixth ribs. 2. Multiple remote  healed right posterior rib fracture deformities. 3. Chronic pleuroparenchymal scarring within the lung bases. Electronically Signed   By: Signa Kell M.D.   On: 04/26/2023 09:20   DG Shoulder Left  Result Date: 04/26/2023 CLINICAL DATA:  prox humerus pain, fall from ladder EXAM: LEFT SHOULDER - 2+ VIEW COMPARISON:  None Available. FINDINGS: There is no evidence of shoulder fracture or dislocation. There is no evidence of arthropathy or other focal bone abnormality. Minimally displaced fracture anterolateral aspect left fifth rib incidentally noted. Left atrial clip. Aortic Atherosclerosis (ICD10-170.0). Soft tissues are unremarkable. IMPRESSION: 1. Negative left shoulder. 2. Left fifth rib fracture. Electronically Signed   By: Corlis Leak M.D.   On: 04/26/2023 09:19   DG Elbow Complete Left  Result Date: 04/26/2023 CLINICAL DATA:  prox humerus pain, fall from ladder EXAM: LEFT ELBOW - COMPLETE 3+ VIEW COMPARISON:  None Available. FINDINGS: There is no evidence of fracture, dislocation, or joint effusion. There is no evidence of arthropathy or other focal bone abnormality. Soft tissues are unremarkable. IMPRESSION: Negative. Electronically Signed   By: Corlis Leak M.D.   On: 04/26/2023 09:17   DG Wrist Complete Left  Result Date: 04/26/2023 CLINICAL DATA:  prox humerus pain, fall from ladder EXAM: LEFT WRIST - COMPLETE 3+ VIEW COMPARISON:  None Available. FINDINGS: Mild diffuse osteopenia. Mild DJD at the 1st [facility] articulation. Carpal rows intact. Small  calcific density dorsal to the first carpal row may represent avulsion fracture from the triquetrum. No other fracture or dislocation. Scattered arterial calcifications. Chondrocalcinosis in the TFCC. IMPRESSION: 1. Possible triquetral avulsion fracture. Correlate with point tenderness. 2. Osteopenia and degenerative changes as above. Electronically Signed   By: Corlis Leak M.D.   On: 04/26/2023 09:16    Pertinent labs & imaging results that were available during my care of the patient were reviewed by me and considered in my medical decision making (see MDM for details).  Medications Ordered in ED Medications  lidocaine (LIDODERM) 5 % 1 patch (1 patch Transdermal Patch Applied 04/26/23 1004)                                                                                                                                     Procedures Procedures  (including critical care time)  Medical Decision Making / ED Course    Medical Decision Making:    Braddox Harnage is a 89 y.o. male with past medical history as below, significant for CAD,, PAF, HTN, GERD, HLD, mitral regurg, mitral valve repair, prostate cancer who presents to the ED with complaint of fall.. The complaint involves an extensive differential diagnosis and also carries with it a high risk of complications and morbidity.  Serious etiology was considered. Ddx includes but is not limited to: Fracture, dislocation, soft tissue injury, ligamentous injury, etc.  Complete initial physical exam performed, notably the patient  was no acute distress, resting comfortably,.    Reviewed  and confirmed nursing documentation for past medical history, family history, social history.  Vital signs reviewed.    Clinical Course as of 04/26/23 1033  Fri Apr 26, 2023  0981 Rib fx left 5th/6th ribs [SG]  0937 Wrist XR with possible triquetral avulsion fracture, he does have some mild ttp at this location. Place in splint, f/u ortho-hand [SG]    Clinical  Course User Index [SG] Sloan Leiter, DO    Imaging reviewed, does show multiple rib fractures and possible cuneiform fracture.  Applied shoulder sling, wrist splint, Lidoderm patch for ribs.  No pneumothorax fortunately.  Give incentive spirometer.  Analgesics for home, multi modal.  Follow-up orthopedics in regards to cuneiform avulsion fracture possible  He is feeling better, eager for discharge  F/u with orthopedics   The patient improved significantly and was discharged in stable condition. Detailed discussions were had with the patient regarding current findings, and need for close f/u with PCP or on call doctor. The patient has been instructed to return immediately if the symptoms worsen in any way for re-evaluation. Patient verbalized understanding and is in agreement with current care plan. All questions answered prior to discharge.        Additional history obtained: -Additional history obtained from na -External records from outside source obtained and reviewed including: Chart review including previous notes, labs, imaging, consultation notes including primary care recommendation, medications >> No blood thinners   Lab Tests: na  EKG   EKG Interpretation Date/Time:    Ventricular Rate:    PR Interval:    QRS Duration:    QT Interval:    QTC Calculation:   R Axis:      Text Interpretation:           Imaging Studies ordered: I ordered imaging studies including ribs/shoulder/elbow/wrist xr I independently visualized the following imaging with scope of interpretation limited to determining acute life threatening conditions related to emergency care; findings noted above, significant for rib fx, cuneiform fx? I independently visualized and interpreted imaging. I agree with the radiologist interpretation   Medicines ordered and prescription drug management: Meds ordered this encounter  Medications   lidocaine (LIDODERM) 5 % 1 patch   acetaminophen  (TYLENOL) 325 MG tablet    Sig: Take 2 tablets (650 mg total) by mouth every 6 (six) hours as needed.    Dispense:  36 tablet    Refill:  0   lidocaine (LIDODERM) 5 %    Sig: Place 1 patch onto the skin daily as needed. Remove & Discard patch within 12 hours or as directed by MD    Dispense:  15 patch    Refill:  0   cyclobenzaprine (FLEXERIL) 5 MG tablet    Sig: Take 1 tablet (5 mg total) by mouth 2 (two) times daily as needed for muscle spasms.    Dispense:  10 tablet    Refill:  0    -I have reviewed the patients home medicines and have made adjustments as needed   Consultations Obtained: na   Cardiac Monitoring: Continuous pulse oximetry 95-97% interpreted by myself  Social Determinants of Health:  Diagnosis or treatment significantly limited by social determinants of health: current smoker   Reevaluation: After the interventions noted above, I reevaluated the patient and found that they have improved  Co morbidities that complicate the patient evaluation  Past Medical History:  Diagnosis Date   Aortoiliac occlusive disease (HCC)    Carotid artery disease (HCC)    Complication  of anesthesia 1955   sodium pentathol ?, N/V   Contracture of palmar fascia 06/21/2010   Dysrhythmia    PAF   Enthesopathy of hip region 11/27/2009   Essential hypertension, benign 06/21/2010   GERD (gastroesophageal reflux disease)    occ   Head injury with loss of consciousness (HCC) 2001   short period   Heart murmur    Hyperlipidemia LDL goal <100 06/21/2010   Hypertrophy of prostate without urinary obstruction and other lower urinary tract symptoms (LUTS) 06/21/2010   Hypothyroidism 08/27/2011   Malignant neoplasm of prostate (HCC) 06/21/2010   Mitral regurgitation 10/07/2015   Severe MR noted on ECHO with partially flail post leaflet Oct 03 2015    Osteoarthrosis, unspecified whether generalized or localized, unspecified site 06/21/2010   Other and unspecified hyperlipidemia 06/21/2010    Pain in joint, lower leg 02/25/2012   Palpitations 08/14/2010   Patent foramen ovale 01/04/2016   Closed at the time of mitral valve repair    PONV (postoperative nausea and vomiting)    S/P minimally invasive mitral valve repair 01/04/2016   Complex valvuloplasty including triangular resection of flail segment of posterior leaflet, artificial Gore-tex neochord placement x6 and 32mm Sorin Memo 3D ring annuloplasty via right mini thoracotomy approach with closure of PFO and clipping of LA appendage    Unspecified constipation 06/21/2010   Unspecified essential hypertension 06/21/2010   Unspecified hypothyroidism 08/27/2011   Urinary frequency 08/14/2008      Dispostion: Disposition decision including need for hospitalization was considered, and patient discharged from emergency department.    Final Clinical Impression(s) / ED Diagnoses Final diagnoses:  Closed fracture of multiple ribs of left side, initial encounter  Sprain of left shoulder, unspecified shoulder sprain type, initial encounter  Nondisplaced fracture of triquetrum (cuneiform) bone, left wrist, initial encounter for closed fracture     This chart was dictated using voice recognition software.  Despite best efforts to proofread,  errors can occur which can change the documentation meaning.    Sloan Leiter, DO 04/26/23 1032    Tanda Rockers A, DO 04/26/23 [phone]

## 2023-04-26 NOTE — ED Triage Notes (Signed)
Pt arrives pov, slow gait, endorses mechanical fall x 3 days pta from small step ladder. Pt c/o LT wrist pain and weakness. Cap refill < 3. Denies thinners or head injury. Aox4

## 2023-04-26 NOTE — ED Notes (Signed)
Pt in bed, pt ambulatory from the bathroom with steady gait, cleaned pt's knee and dressed wound per md instructions.  Splint placed on L arm, sling placed, pt states that he has 2/10 pain, states that he is ready to go home, pt verbalized understanding d/c instructions and follow up, pt from department.

## 2023-04-26 NOTE — Discharge Instructions (Addendum)
It was a pleasure caring for you today in the emergency department.  You have 2 broken ribs on the left side and also a possible small fracture in your wrist.  Please follow-up with orthopedics in regards to your possible wrist fracture.  Please use incentive spirometer regularly to reduce the chance of developing pneumonia secondary to your rib fractures.  Please return to the emergency department for any worsening or worrisome symptoms.

## 2023-04-29 ENCOUNTER — Encounter: Payer: Self-pay | Admitting: Internal Medicine

## 2023-04-29 ENCOUNTER — Non-Acute Institutional Stay (SKILLED_NURSING_FACILITY): Payer: Medicare Other | Admitting: Internal Medicine

## 2023-04-29 DIAGNOSIS — E039 Hypothyroidism, unspecified: Secondary | ICD-10-CM | POA: Diagnosis not present

## 2023-04-29 DIAGNOSIS — S2242XD Multiple fractures of ribs, left side, subsequent encounter for fracture with routine healing: Secondary | ICD-10-CM

## 2023-04-29 DIAGNOSIS — I10 Essential (primary) hypertension: Secondary | ICD-10-CM

## 2023-04-29 DIAGNOSIS — M159 Polyosteoarthritis, unspecified: Secondary | ICD-10-CM

## 2023-04-29 DIAGNOSIS — N1831 Chronic kidney disease, stage 3a: Secondary | ICD-10-CM | POA: Diagnosis not present

## 2023-04-29 DIAGNOSIS — M150 Primary generalized (osteo)arthritis: Secondary | ICD-10-CM

## 2023-04-29 DIAGNOSIS — E785 Hyperlipidemia, unspecified: Secondary | ICD-10-CM

## 2023-04-29 DIAGNOSIS — S62102D Fracture of unspecified carpal bone, left wrist, subsequent encounter for fracture with routine healing: Secondary | ICD-10-CM | POA: Diagnosis not present

## 2023-04-29 NOTE — Progress Notes (Signed)
Provider:   Location:  Medical illustrator of Service:  SNF ([id number])  PCP: Mahlon Gammon, MD Patient Care Team: Mahlon Gammon, MD as PCP - General (Internal Medicine) Jake Bathe, MD as PCP - Cardiology (Cardiology) [facility] Jamie Kato, Fredrik Cove, NP as Nurse Practitioner (Geriatric Medicine) Othella Boyer, MD as Consulting Physician (Cardiology) Heloise Purpura, MD as Consulting Physician (Urology)  Extended Emergency Contact Information Primary Emergency Contact: [facility] Phone: [phone] Mobile Phone: [phone] Relation: Daughter  Code Status:  Goals of Care: Advanced Directive information    04/26/2023    8:16 AM  Advanced Directives  Does Patient Have a Medical Advance Directive? Yes      Chief Complaint  Patient presents with   New Admit To SNF    HPI: Patient is a 89 y.o. male seen today for admission to SNF for Rehab  Lives in [place] in [place]  Was on Ladder at his home when he slipped and fell Left wrist was swollen and had pain.  X-rays in ED showed possible left Left triquetral avulsion fracture Also fifth and sixthAlso 5 and 6th Left Rib Fracture Patient was [facility] This morning he states his pain is controlled with Tylenol  he is not having any shortness of breath.   He is performing all his ADLs and wants to go back to his apartment Denies any dizziness  Other history He has h/o Arthritis Takes Relafen for past 8-9 years. Tapering has not helped LE edema  BPH with Frequency  Recent Inguinal Hernia repair  H/o Right Shoulder Arthroplasty MVR repair CKD Stage 3 A Left TKA 06/08/22    Past Medical History:  Diagnosis Date   Aortoiliac occlusive disease (HCC)    Carotid artery disease (HCC)    Complication of anesthesia 1955   sodium pentathol ?, N/V   Contracture of palmar fascia 06/21/2010   Dysrhythmia    PAF   Enthesopathy of hip region 11/27/2009    Essential hypertension, benign 06/21/2010   GERD (gastroesophageal reflux disease)    occ   Head injury with loss of consciousness (HCC) 2001   short period   Heart murmur    Hyperlipidemia LDL goal <100 06/21/2010   Hypertrophy of prostate without urinary obstruction and other lower urinary tract symptoms (LUTS) 06/21/2010   Hypothyroidism 08/27/2011   Malignant neoplasm of prostate (HCC) 06/21/2010   Mitral regurgitation 10/07/2015   Severe MR noted on ECHO with partially flail post leaflet Oct 03 2015    Osteoarthrosis, unspecified whether generalized or localized, unspecified site 06/21/2010   Other and unspecified hyperlipidemia 06/21/2010   Pain in joint, lower leg 02/25/2012   Palpitations 08/14/2010   Patent foramen ovale 01/04/2016   Closed at the time of mitral valve repair    PONV (postoperative nausea and vomiting)    S/P minimally invasive mitral valve repair 01/04/2016   Complex valvuloplasty including triangular resection of flail segment of posterior leaflet, artificial Gore-tex neochord placement x6 and 32mm Sorin Memo 3D ring annuloplasty via right mini thoracotomy approach with closure of PFO and clipping of LA appendage    Unspecified constipation 06/21/2010   Unspecified essential hypertension 06/21/2010   Unspecified hypothyroidism 08/27/2011   Urinary frequency 08/14/2008   Past Surgical History:  Procedure Laterality Date   CARDIAC CATHETERIZATION N/A 11/10/2015   Procedure: Right/Left Heart Cath and Coronary Angiography;  Surgeon: Peter M Swaziland, MD;  Location: [facility] INVASIVE CV LAB;  Service: Cardiovascular;  Laterality: N/A;   CATARACT EXTRACTION W/ INTRAOCULAR LENS  IMPLANT, BILATERAL  2006   CLIPPING OF ATRIAL APPENDAGE N/A 01/04/2016   Procedure: CLIPPING OF ATRIAL APPENDAGE;  Surgeon: Purcell Nails, MD;  Location: [facility] OR;  Service: Open Heart Surgery;  Laterality: N/A;   COLONOSCOPY     CORONARY ANGIOPLASTY  2017   Dr Swaziland   INGUINAL HERNIA REPAIR Left 12/19/2020    Procedure: LEFT OPEN INGUINAL HERNIA REPAIR WITHOUT MESH;  Surgeon: Kinsinger, De Blanch, MD;  Location: [facility] [place];  Service: General;  Laterality: Left;  ROOM 3 STARTING AT 11:30AM FOR 60 MIN   KNEE ARTHROSCOPY WITH MENISCAL REPAIR Left 1968   MITRAL VALVE REPAIR Right 01/04/2016   Procedure: MINIMALLY INVASIVE MITRAL VALVE REPAIR (MVR);  Surgeon: Purcell Nails, MD;  Location: [facility] OR;  Service: Open Heart Surgery;  Laterality: Right;   Open knee- ligament repair Right 1955   Dr. Sandra Cockayne   PROSTATE SURGERY  2000   seed implants   REVERSE SHOULDER ARTHROPLASTY Right 08/12/2020   Procedure: REVERSE SHOULDER ARTHROPLASTY;  Surgeon: Francena Hanly, MD;  Location: [facility] OR;  Service: Orthopedics;  Laterality: Right;    TEE WITHOUT CARDIOVERSION N/A 11/10/2015   Procedure: TRANSESOPHAGEAL ECHOCARDIOGRAM (TEE);  Surgeon: Chilton Si, MD;  Location: [facility] ENDOSCOPY;  Service: Cardiovascular;  Laterality: N/A;   TEE WITHOUT CARDIOVERSION N/A 01/04/2016   Procedure: TRANSESOPHAGEAL ECHOCARDIOGRAM (TEE);  Surgeon: Purcell Nails, MD;  Location: [facility] OR;  Service: Open Heart Surgery;  Laterality: N/A;   TOTAL KNEE ARTHROPLASTY Left 06/08/2022   Procedure: LEFT TOTAL KNEE ARTHROPLASTY;  Surgeon: Beverely Low, MD;  Location: [facility] ORS;  Service: Orthopedics;  Laterality: Left;    reports that he has been smoking cigarettes, pipe, and cigars. He has a 2.50 pack-year smoking history. He has never used smokeless tobacco. He reports current alcohol use of about 14.0 standard drinks of alcohol per week. He reports that he does not use drugs. Social History   Socioeconomic History   Marital status: Married    Spouse name: Not on file   Number of children: Not on file   Years of education: Not on file   Highest education level: Not on file  Occupational History   Occupation: retired Multimedia programmer at [employer]  Tobacco Use   Smoking status: Some Days    Packs/day: 0.50     Years: 5.00    Additional pack years: 0.00    Total pack years: 2.50    Types: Cigarettes, Pipe, Cigars    Last attempt to quit: 09/04/1969    Years since quitting: 53.6   Smokeless tobacco: Never  Vaping Use   Vaping Use: Never used  Substance and Sexual Activity   Alcohol use: Yes    Alcohol/week: 14.0 standard drinks of alcohol    Types: 14 Shots of liquor per week    Comment: 2 drinks per day   Drug use: No   Sexual activity: Not Currently  Other Topics Concern   Not on file  Social History Narrative   Lives at [place] since 05/2010   Married Aurea Graff    Has living will, POA   Retired from [employer] after 29 years   Stopped smoking 1970   Exercise run 1 1/2 mile every other day, swim 15 minutes every other day, use machines in gym   Alcohol 2 drinks daily   Social Determinants of Health   Financial Resource Strain: Low Risk  (02/18/2018)  Overall Financial Resource Strain (CARDIA)    Difficulty of Paying Living Expenses: Not hard at all  Food Insecurity: No Food Insecurity (02/18/2018)   Hunger Vital Sign    Worried About Running Out of Food in the Last Year: Never true    Ran Out of Food in the Last Year: Never true  Transportation Needs: No Transportation Needs (02/18/2018)   PRAPARE - Administrator, Civil Service (Medical): No    Lack of Transportation (Non-Medical): No  Physical Activity: Insufficiently Active (02/18/2018)   Exercise Vital Sign    Days of Exercise per Week: 4 days    Minutes of Exercise per Session: 30 min  Stress: Stress Concern Present (02/18/2018)   [employer] of Occupational Health - Occupational Stress Questionnaire    Feeling of Stress : To some extent  Social Connections: Moderately Integrated (02/18/2018)   Social Connection and Isolation Panel [NHANES]    Frequency of Communication with Friends and Family: More than three times a week    Frequency of Social Gatherings with Friends and Family: More than three times a week     Attends Religious Services: More than 4 times per year    Active Member of Clubs or Organizations: No    Attends Banker Meetings: Never    Marital Status: Married  Catering manager Violence: Not At Risk (02/18/2018)   Humiliation, Afraid, Rape, and Kick questionnaire    Fear of Current or Ex-Partner: No    Emotionally Abused: No    Physically Abused: No    Sexually Abused: No    Functional Status Survey:    Family History  Problem Relation Age of Onset   Heart disease Mother        myocardial infarction   Heart disease Father        myocardial infarction    Health Maintenance  Topic Date Due   Medicare Annual Wellness (AWV)  08/26/2021   COVID-19 Vaccine (5 - 2023-24 season) 06/22/2022   INFLUENZA VACCINE  05/23/2023   DTaP/Tdap/Td (3 - Td or Tdap) 02/27/2025   Pneumonia Vaccine 51+ Years old  Completed   Zoster Vaccines- Shingrix  Completed   HPV VACCINES  Aged Out    Allergies  Allergen Reactions   Shingrix [Zoster Vac Recomb Adjuvanted]     Outpatient Encounter Medications as of 04/29/2023  Medication Sig   alfuzosin (UROXATRAL) 10 MG 24 hr tablet TAKE 1 TABLET DAILY WITH BREAKFAST   acetaminophen (TYLENOL) 325 MG tablet Take 2 tablets (650 mg total) by mouth every 6 (six) hours as needed.   amoxicillin (AMOXIL) 250 MG chewable tablet Chew 500 mg by mouth as needed. 2000mg  1x one hour prior to dental procedures.   cyclobenzaprine (FLEXERIL) 5 MG tablet Take 1 tablet (5 mg total) by mouth 2 (two) times daily as needed for muscle spasms.   DUPIXENT 300 MG/2ML SOPN INJECT 300 MG UNDER THE SKIN EVERY 14 DAYS   latanoprost (XALATAN) 0.005 % ophthalmic solution Place 1 drop into both eyes at bedtime.   Levothyroxine Sodium (TIROSINT) 50 MCG CAPS TAKE 1 CAPSULE DAILY BEFORE BREAKFAST   lidocaine (LIDODERM) 5 % Place 1 patch onto the skin daily as needed. Remove & Discard patch within 12 hours or as directed by MD   losartan (COZAAR) 25 MG tablet Take 0.5  tablets (12.5 mg total) by mouth daily.   nabumetone (RELAFEN) 750 MG tablet TAKE 1 TABLET TWICE A DAY   nitroGLYCERIN (NITROSTAT) 0.4 MG SL tablet  Place 1 tablet (0.4 mg total) under the tongue every 5 (five) minutes as needed for chest pain.   polyethylene glycol (MIRALAX / GLYCOLAX) 17 g packet Take 17 g by mouth daily as needed for mild constipation.   simvastatin (ZOCOR) 20 MG tablet Take 1 tablet (20 mg total) by mouth at bedtime.   Timolol Maleate, Once-Daily, 0.5 % SOLN Apply 1 drop to eye daily. Left eye every morning   VESICARE 5 MG tablet Take 5 mg by mouth daily.   [DISCONTINUED] acetaminophen (TYLENOL) 325 MG tablet Take 650 mg by mouth every 4 (four) hours as needed.   [DISCONTINUED] dupilumab (DUPIXENT) 300 MG/2ML prefilled syringe Inject 300 mg into the skin every 14 (fourteen) days.   [DISCONTINUED] influenza vaccine adjuvanted (FLUAD QUADRIVALENT) 0.5 ML injection Inject into the muscle.   No facility-administered encounter medications on file as of 04/29/2023.    Review of Systems  Constitutional:  Negative for activity change, appetite change and unexpected weight change.  HENT: Negative.    Respiratory:  Negative for cough and shortness of breath.   Cardiovascular:  Positive for leg swelling.  Gastrointestinal:  Negative for constipation.  Genitourinary:  Negative for frequency.  Musculoskeletal:  Negative for arthralgias, gait problem and myalgias.  Skin: Negative.  Negative for rash.  Neurological:  Negative for dizziness and weakness.  Psychiatric/Behavioral:  Negative for confusion and sleep disturbance.   All other systems reviewed and are negative.   Vitals:   04/29/23 1238  BP: 115/72  Pulse: 71  Resp: 18  Temp: 98.1 F (36.7 C)   There is no height or weight on file to calculate BMI. Physical Exam Vitals reviewed.  Constitutional:      Appearance: Normal appearance.  HENT:     Head: Normocephalic.     Nose: Nose normal.     Mouth/Throat:      Mouth: Mucous membranes are moist.     Pharynx: Oropharynx is clear.  Eyes:     Pupils: Pupils are equal, round, and reactive to light.  Cardiovascular:     Rate and Rhythm: Normal rate and regular rhythm.     Pulses: Normal pulses.     Heart sounds: Murmur heard.  Pulmonary:     Effort: Pulmonary effort is normal. No respiratory distress.     Breath sounds: Normal breath sounds. No rales.  Abdominal:     General: Abdomen is flat. Bowel sounds are normal.     Palpations: Abdomen is soft.  Musculoskeletal:        General: Swelling present.     Cervical back: Neck supple.     Comments: Left wrist swollen but no tenderness Moving it well  Skin:    General: Skin is warm.  Neurological:     General: No focal deficit present.     Mental Status: He is alert and oriented to person, place, and time.  Psychiatric:        Mood and Affect: Mood normal.        Thought Content: Thought content normal.     Labs reviewed: Basic Metabolic Panel: Recent Labs    06/01/22 1041 06/09/22 0350 06/18/22 0000 01/17/23 0000  NA 139 137 139 137  K 4.3 4.0 4.8 4.5  CL 107 108 104 103  CO2 26 24 26* 24*  GLUCOSE 79 108*  --   --   BUN 28* 20 19  --   CREATININE 1.70* 1.33* 1.3 1.4*  CALCIUM 9.2 8.1* 8.6* 9.1   Liver  Function Tests: No results for input(s): "AST", "ALT", "ALKPHOS", "BILITOT", "PROT", "ALBUMIN" in the last 8760 hours. No results for input(s): "LIPASE", "AMYLASE" in the last 8760 hours. No results for input(s): "AMMONIA" in the last 8760 hours. CBC: Recent Labs    06/01/22 1041 06/09/22 0350 07/24/22 0000  WBC 4.4 8.0 4.1  HGB 12.4* 10.1* 11.0*  HCT 37.5* 30.1* 32*  MCV 93.3 92.9  --   PLT 221 178 237   Cardiac Enzymes: No results for input(s): "CKTOTAL", "CKMB", "CKMBINDEX", "TROPONINI" in the last 8760 hours. BNP: Invalid input(s): "POCBNP" Lab Results  Component Value Date   HGBA1C 5.5 01/02/2016   Lab Results  Component Value Date   TSH 5.41 01/17/2023    No results found for: "VITAMINB12" No results found for: "FOLATE" Lab Results  Component Value Date   IRON 120 06/23/2019    Imaging and Procedures obtained prior to SNF admission: DG Ribs Unilateral W/Chest Left  Result Date: 04/26/2023 CLINICAL DATA:  Status post fall from ladder. EXAM: LEFT RIBS AND CHEST - 3+ VIEW COMPARISON:  CT 01/23/2016 FINDINGS: Heart size normal. Status post mitral valve repair and left atrial clipping. Chronic pleuroparenchymal scarring identified within the lung bases. No signs of pleural effusion, airspace consolidation, atelectasis, or pneumothorax. Chronic coarsened interstitial markings as noted previously. Multiple remote healed right posterior rib fracture deformities appears similar to previous exam. Fractures involving the anterior aspect of the left fifth and sixth ribs are identified. Previous right shoulder reverse arthroplasty. IMPRESSION: 1. Acute fractures involving the anterior aspect of the left fifth and sixth ribs. 2. Multiple remote healed right posterior rib fracture deformities. 3. Chronic pleuroparenchymal scarring within the lung bases. Electronically Signed   By: Signa Kell M.D.   On: 04/26/2023 09:20   DG Shoulder Left  Result Date: 04/26/2023 CLINICAL DATA:  prox humerus pain, fall from ladder EXAM: LEFT SHOULDER - 2+ VIEW COMPARISON:  None Available. FINDINGS: There is no evidence of shoulder fracture or dislocation. There is no evidence of arthropathy or other focal bone abnormality. Minimally displaced fracture anterolateral aspect left fifth rib incidentally noted. Left atrial clip. Aortic Atherosclerosis (ICD10-170.0). Soft tissues are unremarkable. IMPRESSION: 1. Negative left shoulder. 2. Left fifth rib fracture. Electronically Signed   By: Corlis Leak M.D.   On: 04/26/2023 09:19   DG Elbow Complete Left  Result Date: 04/26/2023 CLINICAL DATA:  prox humerus pain, fall from ladder EXAM: LEFT ELBOW - COMPLETE 3+ VIEW COMPARISON:  None  Available. FINDINGS: There is no evidence of fracture, dislocation, or joint effusion. There is no evidence of arthropathy or other focal bone abnormality. Soft tissues are unremarkable. IMPRESSION: Negative. Electronically Signed   By: Corlis Leak M.D.   On: 04/26/2023 09:17   DG Wrist Complete Left  Result Date: 04/26/2023 CLINICAL DATA:  prox humerus pain, fall from ladder EXAM: LEFT WRIST - COMPLETE 3+ VIEW COMPARISON:  None Available. FINDINGS: Mild diffuse osteopenia. Mild DJD at the 1st [facility] articulation. Carpal rows intact. Small calcific density dorsal to the first carpal row may represent avulsion fracture from the triquetrum. No other fracture or dislocation. Scattered arterial calcifications. Chondrocalcinosis in the TFCC. IMPRESSION: 1. Possible triquetral avulsion fracture. Correlate with point tenderness. 2. Osteopenia and degenerative changes as above. Electronically Signed   By: Corlis Leak M.D.   On: 04/26/2023 09:16    Assessment/Plan 1. Closed fracture of left wrist with routine healing, subsequent encounter Pain Controlled with Tylenol Follow up with Ortho arranged Continue using Brace/Splint till sees  Ortho 2. Closed fracture of multiple ribs of left side with routine healing, subsequent encounter Pain Controlled Tylenol PRN  3. Primary osteoarthritis involving multiple joints Takes Relafen Trying GDR  4. Hypothyroidism, unspecified type TSH followed in CLinic  5. Hyperlipidemia LDL goal <100 On statin  6. Stage 3a chronic kidney disease (HCC) Follow  7. Essential hypertension, benign On Low dose of Cozaar    Family/ staff Communication:   Labs/tests ordered:

## 2023-04-29 NOTE — Addendum Note (Signed)
Addended by: Cephus Richer on: 04/29/2023 12:48 PM   Modules accepted: Orders

## 2023-05-01 DIAGNOSIS — Z85828 Personal history of other malignant neoplasm of skin: Secondary | ICD-10-CM | POA: Diagnosis not present

## 2023-05-01 DIAGNOSIS — C44319 Basal cell carcinoma of skin of other parts of face: Secondary | ICD-10-CM | POA: Diagnosis not present

## 2023-05-06 DIAGNOSIS — M25532 Pain in left wrist: Secondary | ICD-10-CM | POA: Diagnosis not present

## 2023-05-17 ENCOUNTER — Other Ambulatory Visit: Payer: Self-pay | Admitting: Internal Medicine

## 2023-05-21 DIAGNOSIS — H401422 Capsular glaucoma with pseudoexfoliation of lens, left eye, moderate stage: Secondary | ICD-10-CM | POA: Diagnosis not present

## 2023-05-21 DIAGNOSIS — H26491 Other secondary cataract, right eye: Secondary | ICD-10-CM | POA: Diagnosis not present

## 2023-06-19 DIAGNOSIS — H26491 Other secondary cataract, right eye: Secondary | ICD-10-CM | POA: Diagnosis not present

## 2023-06-25 DIAGNOSIS — D485 Neoplasm of uncertain behavior of skin: Secondary | ICD-10-CM | POA: Diagnosis not present

## 2023-06-25 DIAGNOSIS — C44319 Basal cell carcinoma of skin of other parts of face: Secondary | ICD-10-CM | POA: Diagnosis not present

## 2023-06-25 DIAGNOSIS — L821 Other seborrheic keratosis: Secondary | ICD-10-CM | POA: Diagnosis not present

## 2023-06-25 DIAGNOSIS — Z85828 Personal history of other malignant neoplasm of skin: Secondary | ICD-10-CM | POA: Diagnosis not present

## 2023-06-25 DIAGNOSIS — Z08 Encounter for follow-up examination after completed treatment for malignant neoplasm: Secondary | ICD-10-CM | POA: Diagnosis not present

## 2023-06-25 DIAGNOSIS — L814 Other melanin hyperpigmentation: Secondary | ICD-10-CM | POA: Diagnosis not present

## 2023-06-25 DIAGNOSIS — D229 Melanocytic nevi, unspecified: Secondary | ICD-10-CM | POA: Diagnosis not present

## 2023-06-25 DIAGNOSIS — R229 Localized swelling, mass and lump, unspecified: Secondary | ICD-10-CM | POA: Diagnosis not present

## 2023-06-25 DIAGNOSIS — L570 Actinic keratosis: Secondary | ICD-10-CM | POA: Diagnosis not present

## 2023-07-01 ENCOUNTER — Telehealth: Payer: Self-pay

## 2023-07-01 MED ORDER — SIMVASTATIN 20 MG PO TABS: 20.0000 mg | ORAL_TABLET | Freq: Every day | ORAL | 0 refills | Status: DC

## 2023-07-01 MED ORDER — ALFUZOSIN HCL ER 10 MG PO TB24: 10.0000 mg | ORAL_TABLET | Freq: Every day | ORAL | 0 refills | Status: AC

## 2023-07-01 MED ORDER — LOSARTAN POTASSIUM 25 MG PO TABS: 12.5000 mg | ORAL_TABLET | Freq: Every day | ORAL | Status: DC

## 2023-07-01 MED ORDER — TORSEMIDE 20 MG PO TABS: 10.0000 mg | ORAL_TABLET | ORAL | 0 refills | Status: DC

## 2023-07-01 NOTE — Telephone Encounter (Signed)
Medications was sent to the pharmacy and were approve by Mahlon Gammon, MD

## 2023-07-11 DIAGNOSIS — C44319 Basal cell carcinoma of skin of other parts of face: Secondary | ICD-10-CM | POA: Diagnosis not present

## 2023-07-23 DIAGNOSIS — D500 Iron deficiency anemia secondary to blood loss (chronic): Secondary | ICD-10-CM | POA: Diagnosis not present

## 2023-07-23 DIAGNOSIS — I020 Rheumatic chorea with heart involvement: Secondary | ICD-10-CM | POA: Diagnosis not present

## 2023-07-23 DIAGNOSIS — E039 Hypothyroidism, unspecified: Secondary | ICD-10-CM | POA: Diagnosis not present

## 2023-07-23 DIAGNOSIS — D631 Anemia in chronic kidney disease: Secondary | ICD-10-CM | POA: Diagnosis not present

## 2023-07-23 LAB — TSH: TSH: 5.07 (ref 0.41–5.90)

## 2023-07-23 LAB — CBC AND DIFFERENTIAL
HCT: 38 — AB (ref 41–53)
Hemoglobin: 13 — AB (ref 13.5–17.5)
Platelets: 224 10*3/uL (ref 150–400)
WBC: 4.6

## 2023-07-23 LAB — BASIC METABOLIC PANEL
BUN: 30 — AB (ref 4–21)
CO2: 24 — AB (ref 13–22)
Chloride: 104 (ref 99–108)
Creatinine: 1.7 — AB (ref 0.6–1.3)
Glucose: 93
Potassium: 4.3 meq/L (ref 3.5–5.1)
Sodium: 140 (ref 137–147)

## 2023-07-23 LAB — LIPID PANEL
Cholesterol: 204 — AB (ref 0–200)
HDL: 57 (ref 35–70)
LDL Cholesterol: 131
Triglycerides: 83 (ref 40–160)

## 2023-07-23 LAB — HEPATIC FUNCTION PANEL
ALT: 28 U/L (ref 10–40)
AST: 36 (ref 14–40)
Alkaline Phosphatase: 87 (ref 25–125)
Bilirubin, Total: 0.3

## 2023-07-23 LAB — COMPREHENSIVE METABOLIC PANEL
Albumin: 3.9 (ref 3.5–5.0)
Calcium: 8.9 (ref 8.7–10.7)
Globulin: 2.9

## 2023-07-23 LAB — CBC: RBC: 4.08 (ref 3.87–5.11)

## 2023-07-26 ENCOUNTER — Other Ambulatory Visit (HOSPITAL_BASED_OUTPATIENT_CLINIC_OR_DEPARTMENT_OTHER): Payer: Self-pay

## 2023-07-26 DIAGNOSIS — Z23 Encounter for immunization: Secondary | ICD-10-CM | POA: Diagnosis not present

## 2023-07-26 MED ORDER — INFLUENZA VAC A&B SURF ANT ADJ 0.5 ML IM SUSY
0.5000 mL | PREFILLED_SYRINGE | Freq: Once | INTRAMUSCULAR | 0 refills | Status: AC
  Filled 2023-07-26: qty 0.5, 1d supply, fill #0

## 2023-07-26 MED ORDER — COMIRNATY 30 MCG/0.3ML IM SUSY
0.3000 mL | PREFILLED_SYRINGE | Freq: Once | INTRAMUSCULAR | 0 refills | Status: AC
  Filled 2023-07-26: qty 0.3, 1d supply, fill #0

## 2023-07-29 ENCOUNTER — Non-Acute Institutional Stay: Payer: Medicare Other | Admitting: Adult Health

## 2023-07-29 ENCOUNTER — Encounter: Payer: Self-pay | Admitting: Adult Health

## 2023-07-29 ENCOUNTER — Other Ambulatory Visit (HOSPITAL_BASED_OUTPATIENT_CLINIC_OR_DEPARTMENT_OTHER): Payer: Self-pay

## 2023-07-29 VITALS — BP 128/73 | HR 89 | Temp 97.6°F | Resp 17 | Ht 67.0 in | Wt 161.8 lb

## 2023-07-29 DIAGNOSIS — E785 Hyperlipidemia, unspecified: Secondary | ICD-10-CM | POA: Diagnosis not present

## 2023-07-29 DIAGNOSIS — I10 Essential (primary) hypertension: Secondary | ICD-10-CM

## 2023-07-29 DIAGNOSIS — Z9889 Other specified postprocedural states: Secondary | ICD-10-CM | POA: Diagnosis not present

## 2023-07-29 DIAGNOSIS — N1832 Chronic kidney disease, stage 3b: Secondary | ICD-10-CM | POA: Diagnosis not present

## 2023-07-29 DIAGNOSIS — M150 Primary generalized (osteo)arthritis: Secondary | ICD-10-CM

## 2023-07-29 DIAGNOSIS — R413 Other amnesia: Secondary | ICD-10-CM

## 2023-07-29 DIAGNOSIS — E039 Hypothyroidism, unspecified: Secondary | ICD-10-CM

## 2023-07-29 DIAGNOSIS — R600 Localized edema: Secondary | ICD-10-CM

## 2023-07-29 DIAGNOSIS — L209 Atopic dermatitis, unspecified: Secondary | ICD-10-CM | POA: Diagnosis not present

## 2023-07-29 MED ORDER — NABUMETONE 750 MG PO TABS: 750.0000 mg | ORAL_TABLET | Freq: Every day | ORAL | Status: DC

## 2023-07-29 MED ORDER — TORSEMIDE 20 MG PO TABS: 10.0000 mg | ORAL_TABLET | Freq: Every day | ORAL | Status: DC | PRN

## 2023-07-29 MED ORDER — RSVPREF3 VAC RECOMB ADJUVANTED 120 MCG/0.5ML IM SUSR
0.5000 mL | Freq: Once | INTRAMUSCULAR | 0 refills | Status: AC
  Filled 2023-07-29: qty 0.5, 1d supply, fill #0

## 2023-07-29 NOTE — Patient Instructions (Addendum)
Please reduce the Relefan to once daily Encourage hydration  Follow up apt in two months to re check kidney function  Labs to be done prior apt

## 2023-07-29 NOTE — Progress Notes (Signed)
Location:  [facility]   POS: Clinic  Provider:  Peggye Ley, ANP [facility] [phone]    Goals of Care:     07/29/2023    2:35 PM  Advanced Directives  Does Patient Have a Medical Advance Directive? Yes  Type of Estate agent of [place];Living will  Does patient want to make changes to medical advance directive? No - Patient declined  Copy of Healthcare Power of Attorney in Chart? No - copy requested     Chief Complaint  Patient presents with   Medical Management of Chronic Issues    Patient is being seen for a 3 month follow up with labs    Quality Metric Gaps    Patient is due for AWV     HPI: Patient is a 89 y.o. male seen today for medical management of chronic diseases.    PMH significant for mitral valve repair, OA, HTN, HLD, leg edema, prosate ca with BPH, CAD with angioplasty in 2017, GERD, hypothyroidism,   He was late for his apt because he forgot. He says he is getting more forgetful. He says he is no longer taking torsemide. He also forgot about the dupixent he was prescribed for atopic dermatitis. He has not had any new rashes. He "thinks" he stopped it one month ago. He forgot who prescribes it.   He has no acute complaints. Well dressed and groomed. Lives by himself. He reports he is able to manage his home and in independent in [place].    Works out swimming and in the exercise room three times a week Has lost 6-7 lbs in the past year. Not intentionally.  Has been on Relafen for years for arthritis. Has multiple joints with arthritic pain in the past but no current issues.    CKD 3b: Lab Results  Component Value Date   BUN 30 (A) 07/23/2023   Lab Results  Component Value Date   CREATININE 1.7 (A) 07/23/2023     Hypothyroidism Lab Results  Component Value Date   TSH 5.07 07/23/2023   HLD Lab Results  Component Value Date   LDLCALC 131 07/23/2023     Past Medical History:  Diagnosis Date    Aortoiliac occlusive disease (HCC)    Carotid artery disease (HCC)    Complication of anesthesia 1955   sodium pentathol ?, N/V   Contracture of palmar fascia 06/21/2010   Dysrhythmia    PAF   Enthesopathy of hip region 11/27/2009   Essential hypertension, benign 06/21/2010   GERD (gastroesophageal reflux disease)    occ   Head injury with loss of consciousness (HCC) 2001   short period   Heart murmur    Hyperlipidemia LDL goal <100 06/21/2010   Hypertrophy of prostate without urinary obstruction and other lower urinary tract symptoms (LUTS) 06/21/2010   Hypothyroidism 08/27/2011   Malignant neoplasm of prostate (HCC) 06/21/2010   Mitral regurgitation 10/07/2015   Severe MR noted on ECHO with partially flail post leaflet Oct 03 2015    Osteoarthrosis, unspecified whether generalized or localized, unspecified site 06/21/2010   Other and unspecified hyperlipidemia 06/21/2010   Pain in joint, lower leg 02/25/2012   Palpitations 08/14/2010   Patent foramen ovale 01/04/2016   Closed at the time of mitral valve repair    PONV (postoperative nausea and vomiting)    S/P minimally invasive mitral valve repair 01/04/2016   Complex valvuloplasty including triangular resection of flail segment of posterior leaflet, artificial Gore-tex neochord placement x6  and 32mm Sorin Memo 3D ring annuloplasty via right mini thoracotomy approach with closure of PFO and clipping of LA appendage    Unspecified constipation 06/21/2010   Unspecified essential hypertension 06/21/2010   Unspecified hypothyroidism 08/27/2011   Urinary frequency 08/14/2008    Past Surgical History:  Procedure Laterality Date   CARDIAC CATHETERIZATION N/A 11/10/2015   Procedure: Right/Left Heart Cath and Coronary Angiography;  Surgeon: Peter M Swaziland, MD;  Location: [facility] INVASIVE CV LAB;  Service: Cardiovascular;  Laterality: N/A;   CATARACT EXTRACTION W/ INTRAOCULAR LENS  IMPLANT, BILATERAL  2006   CLIPPING OF ATRIAL APPENDAGE N/A 01/04/2016    Procedure: CLIPPING OF ATRIAL APPENDAGE;  Surgeon: Purcell Nails, MD;  Location: [facility] OR;  Service: Open Heart Surgery;  Laterality: N/A;   COLONOSCOPY     CORONARY ANGIOPLASTY  2017   Dr Swaziland   INGUINAL HERNIA REPAIR Left 12/19/2020   Procedure: LEFT OPEN INGUINAL HERNIA REPAIR WITHOUT MESH;  Surgeon: Kinsinger, De Blanch, MD;  Location: [facility] ;  Service: General;  Laterality: Left;  ROOM 3 STARTING AT 11:30AM FOR 60 MIN   KNEE ARTHROSCOPY WITH MENISCAL REPAIR Left 1968   MITRAL VALVE REPAIR Right 01/04/2016   Procedure: MINIMALLY INVASIVE MITRAL VALVE REPAIR (MVR);  Surgeon: Purcell Nails, MD;  Location: [facility] OR;  Service: Open Heart Surgery;  Laterality: Right;   Open knee- ligament repair Right 1955   Dr. Sandra Cockayne   PROSTATE SURGERY  2000   seed implants   REVERSE SHOULDER ARTHROPLASTY Right 08/12/2020   Procedure: REVERSE SHOULDER ARTHROPLASTY;  Surgeon: Francena Hanly, MD;  Location: [facility] OR;  Service: Orthopedics;  Laterality: Right;    TEE WITHOUT CARDIOVERSION N/A 11/10/2015   Procedure: TRANSESOPHAGEAL ECHOCARDIOGRAM (TEE);  Surgeon: Chilton Si, MD;  Location: [facility] ENDOSCOPY;  Service: Cardiovascular;  Laterality: N/A;   TEE WITHOUT CARDIOVERSION N/A 01/04/2016   Procedure: TRANSESOPHAGEAL ECHOCARDIOGRAM (TEE);  Surgeon: Purcell Nails, MD;  Location: [facility] OR;  Service: Open Heart Surgery;  Laterality: N/A;   TOTAL KNEE ARTHROPLASTY Left 06/08/2022   Procedure: LEFT TOTAL KNEE ARTHROPLASTY;  Surgeon: Beverely Low, MD;  Location: [facility] ORS;  Service: Orthopedics;  Laterality: Left;    Allergies  Allergen Reactions   Shingrix [Zoster Vac Recomb Adjuvanted]     Outpatient Encounter Medications as of 07/29/2023  Medication Sig   acetaminophen (TYLENOL) 325 MG tablet Take 2 tablets (650 mg total) by mouth every 6 (six) hours as needed.   alfuzosin (UROXATRAL) 10 MG 24 hr tablet Take 1 tablet (10 mg total) by mouth daily with breakfast.   amoxicillin  (AMOXIL) 250 MG chewable tablet Chew 500 mg by mouth as needed. 2000mg  1x one hour prior to dental procedures.   DUPIXENT 300 MG/2ML SOPN INJECT 300 MG UNDER THE SKIN EVERY 14 DAYS   latanoprost (XALATAN) 0.005 % ophthalmic solution Place 1 drop into both eyes at bedtime.   Levothyroxine Sodium (TIROSINT) 50 MCG CAPS TAKE 1 CAPSULE DAILY BEFORE BREAKFAST   lidocaine (LIDODERM) 5 % Place 1 patch onto the skin daily as needed. Remove & Discard patch within 12 hours or as directed by MD   losartan (COZAAR) 25 MG tablet Take 0.5 tablets (12.5 mg total) by mouth daily.   nabumetone (RELAFEN) 750 MG tablet TAKE 1 TABLET TWICE A DAY   nitroGLYCERIN (NITROSTAT) 0.4 MG SL tablet Place 1 tablet (0.4 mg total) under the tongue every 5 (five) minutes as needed for chest pain.   polyethylene glycol (MIRALAX / GLYCOLAX)  17 g packet Take 17 g by mouth daily as needed for mild constipation.   RSV vaccine recomb adjuvanted (AREXVY) 120 MCG/0.5ML injection Inject 0.5 mLs into the muscle once for 1 dose.   simvastatin (ZOCOR) 20 MG tablet Take 1 tablet (20 mg total) by mouth at bedtime.   Timolol Maleate, Once-Daily, 0.5 % SOLN Apply 1 drop to eye daily. Left eye every morning   torsemide (DEMADEX) 20 MG tablet Take 0.5 tablets (10 mg total) by mouth 3 (three) times a week. Take it as needed for LE swelling   VESICARE 5 MG tablet Take 5 mg by mouth daily.   No facility-administered encounter medications on file as of 07/29/2023.    Review of Systems:  Review of Systems  Constitutional:  Positive for unexpected weight change. Negative for activity change, appetite change, chills, diaphoresis, fatigue and fever.  Respiratory:  Negative for cough, shortness of breath, wheezing and stridor.   Cardiovascular:  Positive for leg swelling. Negative for chest pain and palpitations.  Gastrointestinal:  Negative for abdominal distention, abdominal pain, constipation and diarrhea.  Genitourinary:  Negative for difficulty  urinating and dysuria.  Musculoskeletal:  Negative for arthralgias, back pain, gait problem, joint swelling and myalgias.  Neurological:  Negative for dizziness, seizures, syncope, facial asymmetry, speech difficulty, weakness and headaches.  Hematological:  Negative for adenopathy. Does not bruise/bleed easily.  Psychiatric/Behavioral:  Negative for agitation, behavioral problems and confusion.        Memory loss    Health Maintenance  Topic Date Due   Medicare Annual Wellness (AWV)  08/26/2021   COVID-19 Vaccine (6 - 2023-24 season) 09/20/2023   DTaP/Tdap/Td (3 - Td or Tdap) 02/27/2025   Pneumonia Vaccine 67+ Years old  Completed   INFLUENZA VACCINE  Completed   Zoster Vaccines- Shingrix  Completed   HPV VACCINES  Aged Out    Physical Exam: Vitals:   07/29/23 1433  BP: 128/73  Pulse: 89  Resp: 17  Temp: 97.6 F (36.4 C)  TempSrc: Temporal  SpO2: 97%  Weight: 161 lb 12.8 oz (73.4 kg)  Height: 5\' 7"  (1.702 m)   Body mass index is 25.34 kg/m. Wt Readings from Last 3 Encounters:  07/29/23 161 lb 12.8 oz (73.4 kg)  01/22/23 162 lb 12.8 oz (73.8 kg)  09/17/22 168 lb 9.6 oz (76.5 kg)   Physical Exam Vitals reviewed.  Constitutional:      General: He is not in acute distress.    Appearance: He is not diaphoretic.  HENT:     Head: Normocephalic and atraumatic.     Nose: Nose normal.     Mouth/Throat:     Mouth: Mucous membranes are moist.     Pharynx: Oropharynx is clear.  Eyes:     Conjunctiva/sclera: Conjunctivae normal.     Pupils: Pupils are equal, round, and reactive to light.  Neck:     Thyroid: No thyromegaly.     Vascular: No JVD.     Trachea: No tracheal deviation.  Cardiovascular:     Rate and Rhythm: Normal rate and regular rhythm.     Heart sounds: No murmur heard. Pulmonary:     Effort: Pulmonary effort is normal. No respiratory distress.     Breath sounds: Normal breath sounds. No wheezing.  Abdominal:     General: Bowel sounds are normal. There  is no distension.     Palpations: Abdomen is soft.     Tenderness: There is no abdominal tenderness.  Musculoskeletal:  Right lower leg: Edema (trace) present.     Left lower leg: Edema (trace) present.  Lymphadenopathy:     Cervical: No cervical adenopathy.  Skin:    General: Skin is warm and dry.  Neurological:     Mental Status: He is alert and oriented to person, place, and time.     Cranial Nerves: No cranial nerve deficit.     Labs reviewed: Basic Metabolic Panel: Recent Labs    01/17/23 0000 07/23/23 0000  NA 137 140  K 4.5 4.3  CL 103 104  CO2 24* 24*  BUN  --  30*  CREATININE 1.4* 1.7*  CALCIUM 9.1 8.9  TSH 5.41 5.07   Liver Function Tests: Recent Labs    07/23/23 0000  AST 36  ALT 28  ALKPHOS 87  ALBUMIN 3.9   No results for input(s): "LIPASE", "AMYLASE" in the last 8760 hours. No results for input(s): "AMMONIA" in the last 8760 hours. CBC: Recent Labs    07/23/23 0000  WBC 4.6  HGB 13.0*  HCT 38*  PLT 224   Lipid Panel: Recent Labs    01/17/23 0000 07/23/23 0000  CHOL 166 204*  HDL 61 57  LDLCALC 87 131  TRIG 88 83   Lab Results  Component Value Date   HGBA1C 5.5 01/02/2016    Procedures since last visit: No results found.  Assessment/Plan  1. Stage 3b chronic kidney disease (HCC) Worsening Reduce relafan Encourage hydration Repeat BMP 2months   2. Primary osteoarthritis involving multiple joints Reduce relefan to one tablet daily   3. Memory loss, short term Noticing more issues  Will need MMSE at next visit   4. Essential hypertension, benign Controlled Continue losartan  5. Hypothyroidism, unspecified type Continue synthroid   6. Atopic dermatitis, unspecified type He is not having any issues. Was on dupixent but quit taking it. He will monitor for symptoms and re start if they return  7. Hyperlipidemia LDL goal <100 LDL 131  Has lost weight Remains active Repeat 1 year Continue Zocor   8. S/P  minimally invasive mitral valve repair Take amoxicillin prior to dental apts  9. Localized edema Improved Change torsemide to prn (he is no longer taking it scheduled)     Labs/tests ordered:  * No order type specified *  BMP prior to apt  Next appt:  F/U with Dr Chales Abrahams in 2 months    Total time :  time greater than 50% of total time spent doing pt counseling and coordination of care

## 2023-08-29 DIAGNOSIS — Z8546 Personal history of malignant neoplasm of prostate: Secondary | ICD-10-CM | POA: Diagnosis not present

## 2023-09-17 ENCOUNTER — Ambulatory Visit: Payer: Medicare Other | Attending: Physician Assistant | Admitting: Physician Assistant

## 2023-09-17 NOTE — Progress Notes (Deleted)
   Cardiology Office Note:    Date:  09/17/2023  ID:  Myriam Jacobson, DOB 01/23/1932, MRN [id number] PCP: Mahlon Gammon, MD  [facility] Providers Cardiologist:  Donato Schultz, MD { Click to update primary MD,subspecialty MD or APP then REFRESH:1}    {Click to Open Review  :1}   Patient Profile:      Mitral regurgitation  S/p min inv MV repair, LAA clipping, PFO closure in 12/2015 TTE 01/10/2016: Mild concentric LVH, EF 60-65, normal wall motion, GR 2 DD, mild MR, moderate LAE, normal RVSF, mild RAE, mild TR, PASP normal Paroxysmal atrial fibrillation  S/p LAA clipping Patent Foramen Ovale s/p closure in 2017  Coronary artery disease  LHC 11/10/2015: Mid LAD 40 Carotid artery disease [place] 01/02/2016: Bilateral ICA 1-39 Hypertension  Hyperlipidemia  Prostate CA       {      :1}   History of Present Illness:  Discussed the use of AI scribe software for clinical note transcription with the patient, who gave verbal consent to proceed.  Samuel [name] is a 89 y.o. male who returns for follow up of MV disease, AFib. He was last seen by Dr. Anne Fu in 08/2022.         ROSSee HPI ***    Studies Reviewed:       *** Results          Risk Assessment/Calculations:   {Does this patient have ATRIAL FIBRILLATION?:[id number]} No BP recorded.  {Refresh Note OR Click here to enter BP  :1}***       Physical Exam:   VS:  There were no vitals taken for this visit.   Wt Readings from Last 3 Encounters:  07/29/23 161 lb 12.8 oz (73.4 kg)  01/22/23 162 lb 12.8 oz (73.8 kg)  09/17/22 168 lb 9.6 oz (76.5 kg)    Physical Exam***     Assessment and Plan:   Assessment & Plan Nonrheumatic mitral valve regurgitation  Paroxysmal atrial fibrillation (HCC)  Essential hypertension, benign   Assessment and Plan             {      :1}    {Are you ordering a CV Procedure (e.g. stress test, cath, DCCV, TEE, etc)?   Press F2        :[id number]}  Dispo:  No follow-ups on  file.  Signed, Tereso Newcomer, PA-C

## 2023-09-24 DIAGNOSIS — N183 Chronic kidney disease, stage 3 unspecified: Secondary | ICD-10-CM | POA: Diagnosis not present

## 2023-09-24 LAB — COMPREHENSIVE METABOLIC PANEL
Calcium: 9.2 (ref 8.7–10.7)
eGFR: 54

## 2023-09-24 LAB — BASIC METABOLIC PANEL
BUN: 24 — AB (ref 4–21)
CO2: 24 — AB (ref 13–22)
Chloride: 102 (ref 99–108)
Creatinine: 1.3 (ref 0.6–1.3)
EGFR: 54
Glucose: 99
Potassium: 4.3 meq/L (ref 3.5–5.1)
Sodium: 138 (ref 137–147)

## 2023-10-01 ENCOUNTER — Encounter: Payer: Medicare Other | Admitting: Internal Medicine

## 2023-10-01 ENCOUNTER — Encounter: Payer: Self-pay | Admitting: Internal Medicine

## 2023-10-07 ENCOUNTER — Other Ambulatory Visit: Payer: Self-pay | Admitting: Internal Medicine

## 2023-10-10 ENCOUNTER — Other Ambulatory Visit: Payer: Self-pay | Admitting: Internal Medicine

## 2023-10-13 ENCOUNTER — Emergency Department (HOSPITAL_BASED_OUTPATIENT_CLINIC_OR_DEPARTMENT_OTHER)
Admission: EM | Admit: 2023-10-13 | Discharge: 2023-10-13 | Disposition: A | Discharge status: Home / Self Care | Payer: Medicare Other | Attending: Emergency Medicine | Admitting: Emergency Medicine

## 2023-10-13 ENCOUNTER — Emergency Department (HOSPITAL_BASED_OUTPATIENT_CLINIC_OR_DEPARTMENT_OTHER): Payer: Medicare Other

## 2023-10-13 ENCOUNTER — Other Ambulatory Visit: Payer: Self-pay

## 2023-10-13 ENCOUNTER — Emergency Department (HOSPITAL_BASED_OUTPATIENT_CLINIC_OR_DEPARTMENT_OTHER): Payer: Medicare Other | Admitting: Radiology

## 2023-10-13 ENCOUNTER — Encounter (HOSPITAL_BASED_OUTPATIENT_CLINIC_OR_DEPARTMENT_OTHER): Payer: Self-pay | Admitting: Emergency Medicine

## 2023-10-13 DIAGNOSIS — S0003XA Contusion of scalp, initial encounter: Secondary | ICD-10-CM | POA: Diagnosis not present

## 2023-10-13 DIAGNOSIS — Z7901 Long term (current) use of anticoagulants: Secondary | ICD-10-CM | POA: Diagnosis not present

## 2023-10-13 DIAGNOSIS — I10 Essential (primary) hypertension: Secondary | ICD-10-CM | POA: Diagnosis not present

## 2023-10-13 DIAGNOSIS — S0510XA Contusion of eyeball and orbital tissues, unspecified eye, initial encounter: Secondary | ICD-10-CM | POA: Diagnosis not present

## 2023-10-13 DIAGNOSIS — M25512 Pain in left shoulder: Secondary | ICD-10-CM | POA: Diagnosis not present

## 2023-10-13 DIAGNOSIS — S0990XA Unspecified injury of head, initial encounter: Secondary | ICD-10-CM | POA: Diagnosis not present

## 2023-10-13 DIAGNOSIS — M79602 Pain in left arm: Secondary | ICD-10-CM | POA: Diagnosis not present

## 2023-10-13 DIAGNOSIS — I4891 Unspecified atrial fibrillation: Secondary | ICD-10-CM | POA: Insufficient documentation

## 2023-10-13 DIAGNOSIS — W19XXXA Unspecified fall, initial encounter: Secondary | ICD-10-CM | POA: Diagnosis not present

## 2023-10-13 DIAGNOSIS — W01198A Fall on same level from slipping, tripping and stumbling with subsequent striking against other object, initial encounter: Secondary | ICD-10-CM | POA: Diagnosis not present

## 2023-10-13 DIAGNOSIS — S40012A Contusion of left shoulder, initial encounter: Secondary | ICD-10-CM | POA: Diagnosis not present

## 2023-10-13 DIAGNOSIS — Z23 Encounter for immunization: Secondary | ICD-10-CM | POA: Insufficient documentation

## 2023-10-13 DIAGNOSIS — I700 Atherosclerosis of aorta: Secondary | ICD-10-CM | POA: Diagnosis not present

## 2023-10-13 DIAGNOSIS — S199XXA Unspecified injury of neck, initial encounter: Secondary | ICD-10-CM | POA: Diagnosis not present

## 2023-10-13 DIAGNOSIS — R296 Repeated falls: Secondary | ICD-10-CM | POA: Diagnosis not present

## 2023-10-13 DIAGNOSIS — G319 Degenerative disease of nervous system, unspecified: Secondary | ICD-10-CM | POA: Diagnosis not present

## 2023-10-13 DIAGNOSIS — I517 Cardiomegaly: Secondary | ICD-10-CM | POA: Diagnosis not present

## 2023-10-13 DIAGNOSIS — M79622 Pain in left upper arm: Secondary | ICD-10-CM | POA: Diagnosis not present

## 2023-10-13 MED ORDER — TETANUS-DIPHTH-ACELL PERTUSSIS 5-2.5-18.5 LF-MCG/0.5 IM SUSY
0.5000 mL | PREFILLED_SYRINGE | Freq: Once | INTRAMUSCULAR | Status: AC
  Administered 2023-10-13: 0.5 mL via INTRAMUSCULAR
  Filled 2023-10-13: qty 0.5

## 2023-10-13 NOTE — Discharge Instructions (Signed)
You were seen in the emerged department after a fall The CAT scan taken of your head neck and x-rays of your chest and shoulder do not show any broken bones or other concerning findings We change her dressing and cleaned your the abrasion on your forehead out No need for stitches Follow-up with your primary care doctor in 1 week for reevaluation Return to the emerged part for severe headaches or any other concerns

## 2023-10-13 NOTE — Progress Notes (Signed)
error 

## 2023-10-13 NOTE — ED Provider Notes (Signed)
[name] AT [facility] Provider Note   CSN: [id number] Arrival date & time: 10/13/23  1815     History  Chief Complaint  Patient presents with   Marletta Lor    Samuel [name] is a 89 y.o. male.  With a history of hypertension A-fib not on anticoagulation who presents to the ED after fall.  Patient was walking on the sidewalk tonight when he tripped and lost his balance falling face first striking the left side of his forehead on the pavement.  No LOC.  Landed on his left shoulder as well.  Has some left shoulder pain and headache now.  No changes in vision neck pain chest pain shortness of breath abdominal pain nausea or vomiting.  No pain in other extremities.   Fall       Home Medications Prior to Admission medications   Medication Sig Start Date End Date Taking? Authorizing Provider  acetaminophen (TYLENOL) 325 MG tablet Take 2 tablets (650 mg total) by mouth every 6 (six) hours as needed. 04/26/23   Sloan Leiter, DO  alfuzosin (UROXATRAL) 10 MG 24 hr tablet Take 1 tablet (10 mg total) by mouth daily with breakfast. 07/01/23   Mahlon Gammon, MD  amoxicillin (AMOXIL) 250 MG chewable tablet Chew 500 mg by mouth as needed. 2000mg  1x one hour prior to dental procedures.    [provider]  DUPIXENT 300 MG/2ML SOPN INJECT 300 MG UNDER THE SKIN EVERY 14 DAYS Patient not taking: Reported on 07/29/2023 11/23/22   Fletcher Anon, NP  latanoprost (XALATAN) 0.005 % ophthalmic solution Place 1 drop into both eyes at bedtime.    [provider]  Levothyroxine Sodium (TIROSINT) 50 MCG CAPS TAKE 1 CAPSULE DAILY BEFORE BREAKFAST 02/12/23   Mahlon Gammon, MD  lidocaine (LIDODERM) 5 % Place 1 patch onto the skin daily as needed. Remove & Discard patch within 12 hours or as directed by MD 04/26/23   Sloan Leiter, DO  losartan (COZAAR) 25 MG tablet Take 0.5 tablets (12.5 mg total) by mouth daily. 07/01/23   Mahlon Gammon, MD  nabumetone (RELAFEN) 750  MG tablet TAKE 1 TABLET TWICE A DAY 10/07/23   Mahlon Gammon, MD  nitroGLYCERIN (NITROSTAT) 0.4 MG SL tablet Place 1 tablet (0.4 mg total) under the tongue every 5 (five) minutes as needed for chest pain. 09/20/21   Dyann Kief, PA-C  polyethylene glycol (MIRALAX / GLYCOLAX) 17 g packet Take 17 g by mouth daily as needed for mild constipation.    [provider]  simvastatin (ZOCOR) 20 MG tablet TAKE 1 TABLET AT BEDTIME 10/11/23   Mahlon Gammon, MD  Timolol Maleate, Once-Daily, 0.5 % SOLN Apply 1 drop to eye daily. Left eye every morning    [provider]  torsemide (DEMADEX) 20 MG tablet Take 0.5 tablets (10 mg total) by mouth daily as needed. Take it as needed for LE swelling 07/29/23   Fletcher Anon, NP  VESICARE 5 MG tablet Take 5 mg by mouth daily. 08/09/16   [provider]      Allergies    Shingrix [zoster vac recomb adjuvanted]    Review of Systems   Review of Systems  Physical Exam Updated Vital Signs BP (!) 154/95 (BP Location: Right Arm)   Pulse 60   Temp 98 F (36.7 C) (Oral)   Resp 16   SpO2 95%  Physical Exam Vitals and nursing note reviewed.  HENT:  Head: Normocephalic.     Comments: Large hematoma over left frontal/brow region No ophthalmoplegia Abrasions overlying no active bleeding No lacerations Eyes:     Pupils: Pupils are equal, round, and reactive to light.  Cardiovascular:     Rate and Rhythm: Normal rate and regular rhythm.  Pulmonary:     Effort: Pulmonary effort is normal.     Breath sounds: Normal breath sounds.  Abdominal:     Palpations: Abdomen is soft.     Tenderness: There is no abdominal tenderness.  Musculoskeletal:     Cervical back: Neck supple. Tenderness present.     Comments: Tenderness to palpation over proximal left humerus and anterior left shoulder with full active range of motion with flexion extension abduction and adduction of the shoulder joint No tenderness at the elbow or left wrist  or hand Right upper extremity and bilateral lower extremities without evidence of trauma with full active range of motion Pelvis stable to rocking compression No midline tenderness step-off deformities of back  Skin:    General: Skin is warm and dry.  Neurological:     Mental Status: He is alert.  Psychiatric:        Mood and Affect: Mood normal.     ED Results / Procedures / Treatments   Labs (all labs ordered are listed, but only abnormal results are displayed) Labs Reviewed - No data to display  EKG None  Radiology DG Shoulder Left Result Date: 10/13/2023 CLINICAL DATA:  Fall, pain EXAM: LEFT SHOULDER - 2+ VIEW COMPARISON:  04/26/2023 FINDINGS: There is no evidence of fracture or dislocation. There is no evidence of arthropathy or other focal bone abnormality. Soft tissues are unremarkable. IMPRESSION: Negative. Electronically Signed   By: Charlett Nose M.D.   On: 10/13/2023 21:17   DG Humerus Left Result Date: 10/13/2023 CLINICAL DATA:  Fall, pain EXAM: LEFT HUMERUS - 2+ VIEW COMPARISON:  04/26/2023 FINDINGS: There is no evidence of fracture or other focal bone lesions. Soft tissues are unremarkable. IMPRESSION: Negative. Electronically Signed   By: Charlett Nose M.D.   On: 10/13/2023 21:16   DG Chest Portable 1 View Result Date: 10/13/2023 CLINICAL DATA:  Left upper extremity pain, fall EXAM: PORTABLE CHEST 1 VIEW COMPARISON:  04/26/2023 FINDINGS: Cardiomegaly. Aortic atherosclerosis. No confluent opacities, effusions or pneumothorax. No acute bony abnormality. Multiple old right rib fractures noted. IMPRESSION: Mild cardiomegaly. No acute cardiopulmonary disease. Electronically Signed   By: Charlett Nose M.D.   On: 10/13/2023 21:16   CT Head Wo Contrast Result Date: 10/13/2023 CLINICAL DATA:  Trauma. EXAM: CT HEAD WITHOUT CONTRAST CT CERVICAL SPINE WITHOUT CONTRAST TECHNIQUE: Multidetector CT imaging of the head and cervical spine was performed following the standard protocol  without intravenous contrast. Multiplanar CT image reconstructions of the cervical spine were also generated. RADIATION DOSE REDUCTION: This exam was performed according to the departmental dose-optimization program which includes automated exposure control, adjustment of the mA and/or kV according to patient size and/or use of iterative reconstruction technique. COMPARISON:  None Available. FINDINGS: CT HEAD FINDINGS Brain: Moderate age-related atrophy and chronic microvascular ischemic changes. Old right occipital infarct as well as several small foci of old right cerebellar infarcts. There is no acute intracranial hemorrhage. No mass effect or midline shift. No extra-axial fluid collection. Vascular: No hyperdense vessel or unexpected calcification. Skull: Normal. Negative for fracture or focal lesion. Sinuses/Orbits: No acute finding. Other: Left forehead and periorbital hematoma. CT CERVICAL SPINE FINDINGS Alignment: No acute subluxation. Skull base and vertebrae:  No acute fracture.  Osteopenia. Soft tissues and spinal canal: No prevertebral fluid or swelling. No visible canal hematoma. Disc levels:  No acute findings.  Multilevel degenerative changes. Upper chest: Biapical subpleural scarring. Other: Bilateral carotid bulb calcified plaques. IMPRESSION: 1. No acute intracranial pathology. Moderate age-related atrophy and chronic microvascular ischemic changes. 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 2. No acute/traumatic cervical spine pathology. Electronically Signed   By: Elgie Collard M.D.   On: 10/13/2023 21:06   CT Cervical Spine Wo Contrast Result Date: 10/13/2023 CLINICAL DATA:  Trauma. EXAM: CT HEAD WITHOUT CONTRAST CT CERVICAL SPINE WITHOUT CONTRAST TECHNIQUE: Multidetector CT imaging of the head and cervical spine was performed following the standard protocol without intravenous contrast. Multiplanar CT image reconstructions of the cervical spine were also generated. RADIATION DOSE  REDUCTION: This exam was performed according to the departmental dose-optimization program which includes automated exposure control, adjustment of the mA and/or kV according to patient size and/or use of iterative reconstruction technique. COMPARISON:  None Available. FINDINGS: CT HEAD FINDINGS Brain: Moderate age-related atrophy and chronic microvascular ischemic changes. Old right occipital infarct as well as several small foci of old right cerebellar infarcts. There is no acute intracranial hemorrhage. No mass effect or midline shift. No extra-axial fluid collection. Vascular: No hyperdense vessel or unexpected calcification. Skull: Normal. Negative for fracture or focal lesion. Sinuses/Orbits: No acute finding. Other: Left forehead and periorbital hematoma. CT CERVICAL SPINE FINDINGS Alignment: No acute subluxation. Skull base and vertebrae: No acute fracture.  Osteopenia. Soft tissues and spinal canal: No prevertebral fluid or swelling. No visible canal hematoma. Disc levels:  No acute findings.  Multilevel degenerative changes. Upper chest: Biapical subpleural scarring. Other: Bilateral carotid bulb calcified plaques. IMPRESSION: 1. No acute intracranial pathology. Moderate age-related atrophy and chronic microvascular ischemic changes. 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 2. No acute/traumatic cervical spine pathology. Electronically Signed   By: Elgie Collard M.D.   On: 10/13/2023 21:06    Procedures Procedures    Medications Ordered in ED Medications  Tdap (BOOSTRIX) injection 0.5 mL (has no administration in time range)    ED Course/ Medical Decision Making/ A&P Clinical Course as of 10/13/23 2225  Sun Oct 13, 2023  2223 No significant traumatic findings on CT head C-spine chest x-ray left humerus x-ray left shoulder x-ray.  Patient has remained stable.  We have cleaned his scalp abrasion out here and applied a dressing.  Will update his Tdap and discharged with instruction  for PCP follow-up.  Stable for discharge at this time. [MP]    Clinical Course User Index [MP] Royanne Foots, DO                                 Medical Decision Making 89 year old male with history as above presenting after mechanical fall on sidewalk.  Tripped and fell onto pavement falling face first.  Struck the left side of his forehead and left shoulder.  Large hematoma with no eye involvement.  Vision is normal.  No entrapment.  No midline tenderness of the neck.  Some anterior left shoulder tenderness and tenderness over the left proximal humerus without deformity.  Other extremities intact.  Will obtain CT head C-spine shoulder x-ray and humerus x-ray to evaluate for traumatic injury.  Pain under control now patient declines analgesia  Amount and/or Complexity of Data Reviewed Radiology: ordered.  Final Clinical Impression(s) / ED Diagnoses Final diagnoses:  Hematoma of scalp, initial encounter  Contusion of left shoulder, initial encounter  Fall in elderly patient    Rx / DC Orders ED Discharge Orders     None         Royanne Foots, DO 10/13/23 2225

## 2023-10-13 NOTE — ED Triage Notes (Signed)
Pt fell on the sidewalk, hit his left side of his body. Left side of scalp is sore/laceration, left eye has a large hematoma, left shoulder and left knee sore. Alert and oriented in triage, able to stand from stretcher to wheelchair.  Brought in [facility]

## 2023-10-14 ENCOUNTER — Non-Acute Institutional Stay: Payer: Self-pay | Admitting: Internal Medicine

## 2023-10-14 ENCOUNTER — Encounter: Payer: Self-pay | Admitting: Internal Medicine

## 2023-10-14 NOTE — Progress Notes (Unsigned)
Location:  [facility] Room Number: [phone] Place of Service:  Clinic [phone]) Provider:  Mahlon Gammon, MD   Mahlon Gammon, MD  Patient Care Team: Mahlon Gammon, MD as PCP - General (Internal Medicine) Jake Bathe, MD as PCP - Cardiology (Cardiology) [facility] Kato, Fredrik Cove, NP as Nurse Practitioner (Geriatric Medicine) Othella Boyer, MD as Consulting Physician (Cardiology) Heloise Purpura, MD as Consulting Physician (Urology)  Extended Emergency Contact Information Primary Emergency Contact: [facility] Phone: [phone] Mobile Phone: [phone] Relation: Daughter  Code Status:  Full Code  Goals of care: Advanced Directive information    10/14/2023    4:00 PM  Advanced Directives  Does Patient Have a Medical Advance Directive? No  Would patient like information on creating a medical advance directive? No - Patient declined     Chief Complaint  Patient presents with   Acute Visit    Patient is being seen for ED follow up   Immunizations    Patient is due for a covid vaccine    Health Maintenance    Patient is due for a AWV    HPI:  Pt is a 89 y.o. male seen today for an acute visit for    Past Medical History:  Diagnosis Date   Aortoiliac occlusive disease (HCC)    Carotid artery disease (HCC)    Complication of anesthesia 1955   sodium pentathol ?, N/V   Contracture of palmar fascia 06/21/2010   Dysrhythmia    PAF   Enthesopathy of hip region 11/27/2009   Essential hypertension, benign 06/21/2010   GERD (gastroesophageal reflux disease)    occ   Head injury with loss of consciousness (HCC) 2001   short period   Heart murmur    Hyperlipidemia LDL goal <100 06/21/2010   Hypertrophy of prostate without urinary obstruction and other lower urinary tract symptoms (LUTS) 06/21/2010   Hypothyroidism 08/27/2011   Malignant neoplasm of prostate (HCC) 06/21/2010   Mitral regurgitation  10/07/2015   Severe MR noted on ECHO with partially flail post leaflet Oct 03 2015    Osteoarthrosis, unspecified whether generalized or localized, unspecified site 06/21/2010   Other and unspecified hyperlipidemia 06/21/2010   Pain in joint, lower leg 02/25/2012   Palpitations 08/14/2010   Patent foramen ovale 01/04/2016   Closed at the time of mitral valve repair    PONV (postoperative nausea and vomiting)    S/P minimally invasive mitral valve repair 01/04/2016   Complex valvuloplasty including triangular resection of flail segment of posterior leaflet, artificial Gore-tex neochord placement x6 and 32mm Sorin Memo 3D ring annuloplasty via right mini thoracotomy approach with closure of PFO and clipping of LA appendage    Unspecified constipation 06/21/2010   Unspecified essential hypertension 06/21/2010   Unspecified hypothyroidism 08/27/2011   Urinary frequency 08/14/2008   Past Surgical History:  Procedure Laterality Date   CARDIAC CATHETERIZATION N/A 11/10/2015   Procedure: Right/Left Heart Cath and Coronary Angiography;  Surgeon: Peter M Swaziland, MD;  Location: [facility] INVASIVE CV LAB;  Service: Cardiovascular;  Laterality: N/A;   CATARACT EXTRACTION W/ INTRAOCULAR LENS  IMPLANT, BILATERAL  2006   CLIPPING OF ATRIAL APPENDAGE N/A 01/04/2016   Procedure: CLIPPING OF ATRIAL APPENDAGE;  Surgeon: Purcell Nails, MD;  Location: [facility] OR;  Service: Open Heart Surgery;  Laterality: N/A;   COLONOSCOPY     CORONARY ANGIOPLASTY  2017   Dr Swaziland   INGUINAL HERNIA REPAIR Left 12/19/2020   Procedure:  LEFT OPEN INGUINAL HERNIA REPAIR WITHOUT MESH;  Surgeon: Kinsinger, De Blanch, MD;  Location: [facility] [place];  Service: General;  Laterality: Left;  ROOM 3 STARTING AT 11:30AM FOR 60 MIN   KNEE ARTHROSCOPY WITH MENISCAL REPAIR Left 1968   MITRAL VALVE REPAIR Right 01/04/2016   Procedure: MINIMALLY INVASIVE MITRAL VALVE REPAIR (MVR);  Surgeon: Purcell Nails, MD;  Location: [facility] OR;  Service: Open  Heart Surgery;  Laterality: Right;   Open knee- ligament repair Right 1955   Dr. Sandra Cockayne   PROSTATE SURGERY  2000   seed implants   REVERSE SHOULDER ARTHROPLASTY Right 08/12/2020   Procedure: REVERSE SHOULDER ARTHROPLASTY;  Surgeon: Francena Hanly, MD;  Location: [facility] OR;  Service: Orthopedics;  Laterality: Right;    TEE WITHOUT CARDIOVERSION N/A 11/10/2015   Procedure: TRANSESOPHAGEAL ECHOCARDIOGRAM (TEE);  Surgeon: Chilton Si, MD;  Location[facility] ENDOSCOPY;  Service: Cardiovascular;  Laterality: N/A;   TEE WITHOUT CARDIOVERSION N/A 01/04/2016   Procedure: TRANSESOPHAGEAL ECHOCARDIOGRAM (TEE);  Surgeon: Purcell Nails, MD;  Location[facility] OR;  Service: Open Heart Surgery;  Laterality: N/A;   TOTAL KNEE ARTHROPLASTY Left 06/08/2022   Procedure: LEFT TOTAL KNEE ARTHROPLASTY;  Surgeon: Beverely Low, MD;  Location: [facility] ORS;  Service: Orthopedics;  Laterality: Left;    Allergies  Allergen Reactions   Shingrix [Zoster Vac Recomb Adjuvanted]     Outpatient Encounter Medications as of 10/14/2023  Medication Sig   acetaminophen (TYLENOL) 325 MG tablet Take 2 tablets (650 mg total) by mouth every 6 (six) hours as needed.   alfuzosin (UROXATRAL) 10 MG 24 hr tablet Take 1 tablet (10 mg total) by mouth daily with breakfast.   amoxicillin (AMOXIL) 250 MG chewable tablet Chew 500 mg by mouth as needed. 2000mg  1x one hour prior to dental procedures.   DUPIXENT 300 MG/2ML SOPN INJECT 300 MG UNDER THE SKIN EVERY 14 DAYS   latanoprost (XALATAN) 0.005 % ophthalmic solution Place 1 drop into both eyes at bedtime.   Levothyroxine Sodium (TIROSINT) 50 MCG CAPS TAKE 1 CAPSULE DAILY BEFORE BREAKFAST   lidocaine (LIDODERM) 5 % Place 1 patch onto the skin daily as needed. Remove & Discard patch within 12 hours or as directed by MD   losartan (COZAAR) 25 MG tablet Take 0.5 tablets (12.5 mg total) by mouth daily.   nabumetone (RELAFEN) 750 MG tablet TAKE 1 TABLET TWICE A DAY   nitroGLYCERIN (NITROSTAT) 0.4  MG SL tablet Place 1 tablet (0.4 mg total) under the tongue every 5 (five) minutes as needed for chest pain.   polyethylene glycol (MIRALAX / GLYCOLAX) 17 g packet Take 17 g by mouth daily as needed for mild constipation.   simvastatin (ZOCOR) 20 MG tablet TAKE 1 TABLET AT BEDTIME   Timolol Maleate, Once-Daily, 0.5 % SOLN Apply 1 drop to eye daily. Left eye every morning   torsemide (DEMADEX) 20 MG tablet Take 0.5 tablets (10 mg total) by mouth daily as needed. Take it as needed for LE swelling (Patient not taking: Reported on 10/14/2023)   VESICARE 5 MG tablet Take 5 mg by mouth daily.   No facility-administered encounter medications on file as of 10/14/2023.    Review of Systems  Immunization History  Administered Date(s) Administered   Fluad Quad(high Dose 65+) 07/23/2022, 08/03/2022   Fluad Trivalent(High Dose 65+) 07/26/2023   Influenza, High Dose Seasonal PF 08/16/2017, 08/22/2020   Influenza,inj,Quad PF,6+ Mos 07/20/2016, 08/15/2018, 07/31/2019   Influenza-Unspecified 07/27/2013, 07/07/2014, 08/11/2015, 08/11/2021   Moderna Covid-19 Vaccine Bivalent  Booster 52yrs & up 10/04/2021   Moderna SARS-COV2 Booster Vaccination 05/26/2021   Moderna Sars-Covid-2 Vaccination 11/30/2018, 11/03/2019, 09/06/2020   Pfizer(Comirnaty)Fall Seasonal Vaccine 12 years and older 07/26/2023   Pneumococcal Conjugate-13 10/26/2014   Pneumococcal Polysaccharide-23 11/23/2015   Td 10/23/2007   Tdap 02/28/2015, 10/13/2023   Zoster Recombinant(Shingrix) 12/03/2016, 03/06/2017   Zoster, Live 10/22/2008   Pertinent  Health Maintenance Due  Topic Date Due   INFLUENZA VACCINE  Completed      06/08/2022   10:00 PM 06/09/2022    9:00 AM 06/22/2022    9:22 AM 01/22/2023    1:53 PM 07/29/2023    2:35 PM  Fall Risk  Falls in the past year?    0 0  Was there an injury with Fall?    0 0  Fall Risk Category Calculator    0 0  (RETIRED) Patient Fall Risk Level High fall risk High fall risk High fall risk     Patient at Risk for Falls Due to   History of fall(s)  No Fall Risks  Fall risk Follow up   Falls evaluation completed Falls evaluation completed Falls evaluation completed   Functional Status Survey:    Vitals:   10/14/23 1555  BP: 115/72  Pulse: 71  Resp: 18  Temp: 98.1 F (36.7 C)  TempSrc: Temporal  SpO2: 93%  Height: 5\' 7"  (1.702 m)   Body mass index is 25.34 kg/m. Physical Exam  Labs reviewed: Recent Labs    01/17/23 0000 07/23/23 0000 09/24/23 0000  NA 137 140 138  K 4.5 4.3 4.3  CL 103 104 102  CO2 24* 24* 24*  BUN  --  30* 24*  CREATININE 1.4* 1.7* 1.3  CALCIUM 9.1 8.9 9.2   Recent Labs    07/23/23 0000  AST 36  ALT 28  ALKPHOS 87  ALBUMIN 3.9   Recent Labs    07/23/23 0000  WBC 4.6  HGB 13.0*  HCT 38*  PLT 224   Lab Results  Component Value Date   TSH 5.07 07/23/2023   Lab Results  Component Value Date   HGBA1C 5.5 01/02/2016   Lab Results  Component Value Date   CHOL 204 (A) 07/23/2023   HDL 57 07/23/2023   LDLCALC 131 07/23/2023   TRIG 83 07/23/2023    Significant Diagnostic Results in last 30 days:  DG Shoulder Left Result Date: 10/13/2023 CLINICAL DATA:  Fall, pain EXAM: LEFT SHOULDER - 2+ VIEW COMPARISON:  04/26/2023 FINDINGS: There is no evidence of fracture or dislocation. There is no evidence of arthropathy or other focal bone abnormality. Soft tissues are unremarkable. IMPRESSION: Negative. Electronically Signed   By: Charlett Nose M.D.   On: 10/13/2023 21:17   DG Humerus Left Result Date: 10/13/2023 CLINICAL DATA:  Fall, pain EXAM: LEFT HUMERUS - 2+ VIEW COMPARISON:  04/26/2023 FINDINGS: There is no evidence of fracture or other focal bone lesions. Soft tissues are unremarkable. IMPRESSION: Negative. Electronically Signed   By: Charlett Nose M.D.   On: 10/13/2023 21:16   DG Chest Portable 1 View Result Date: 10/13/2023 CLINICAL DATA:  Left upper extremity pain, fall EXAM: PORTABLE CHEST 1 VIEW COMPARISON:  04/26/2023  FINDINGS: Cardiomegaly. Aortic atherosclerosis. No confluent opacities, effusions or pneumothorax. No acute bony abnormality. Multiple old right rib fractures noted. IMPRESSION: Mild cardiomegaly. No acute cardiopulmonary disease. Electronically Signed   By: Charlett Nose M.D.   On: 10/13/2023 21:16   CT Head Wo Contrast Result Date: 10/13/2023 CLINICAL DATA:  Trauma. EXAM: CT HEAD WITHOUT CONTRAST CT CERVICAL SPINE WITHOUT CONTRAST TECHNIQUE: Multidetector CT imaging of the head and cervical spine was performed following the standard protocol without intravenous contrast. Multiplanar CT image reconstructions of the cervical spine were also generated. RADIATION DOSE REDUCTION: This exam was performed according to the departmental dose-optimization program which includes automated exposure control, adjustment of the mA and/or kV according to patient size and/or use of iterative reconstruction technique. COMPARISON:  None Available. FINDINGS: CT HEAD FINDINGS Brain: Moderate age-related atrophy and chronic microvascular ischemic changes. Old right occipital infarct as well as several small foci of old right cerebellar infarcts. There is no acute intracranial hemorrhage. No mass effect or midline shift. No extra-axial fluid collection. Vascular: No hyperdense vessel or unexpected calcification. Skull: Normal. Negative for fracture or focal lesion. Sinuses/Orbits: No acute finding. Other: Left forehead and periorbital hematoma. CT CERVICAL SPINE FINDINGS Alignment: No acute subluxation. Skull base and vertebrae: No acute fracture.  Osteopenia. Soft tissues and spinal canal: No prevertebral fluid or swelling. No visible canal hematoma. Disc levels:  No acute findings.  Multilevel degenerative changes. Upper chest: Biapical subpleural scarring. Other: Bilateral carotid bulb calcified plaques. IMPRESSION: 1. No acute intracranial pathology. Moderate age-related atrophy and chronic microvascular ischemic changes. 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 2. No acute/traumatic cervical spine pathology. Electronically Signed   By: Elgie Collard M.D.   On: 10/13/2023 21:06   CT Cervical Spine Wo Contrast Result Date: 10/13/2023 CLINICAL DATA:  Trauma. EXAM: CT HEAD WITHOUT CONTRAST CT CERVICAL SPINE WITHOUT CONTRAST TECHNIQUE: Multidetector CT imaging of the head and cervical spine was performed following the standard protocol without intravenous contrast. Multiplanar CT image reconstructions of the cervical spine were also generated. RADIATION DOSE REDUCTION: This exam was performed according to the departmental dose-optimization program which includes automated exposure control, adjustment of the mA and/or kV according to patient size and/or use of iterative reconstruction technique. COMPARISON:  None Available. FINDINGS: CT HEAD FINDINGS Brain: Moderate age-related atrophy and chronic microvascular ischemic changes. Old right occipital infarct as well as several small foci of old right cerebellar infarcts. There is no acute intracranial hemorrhage. No mass effect or midline shift. No extra-axial fluid collection. Vascular: No hyperdense vessel or unexpected calcification. Skull: Normal. Negative for fracture or focal lesion. Sinuses/Orbits: No acute finding. Other: Left forehead and periorbital hematoma. CT CERVICAL SPINE FINDINGS Alignment: No acute subluxation. Skull base and vertebrae: No acute fracture.  Osteopenia. Soft tissues and spinal canal: No prevertebral fluid or swelling. No visible canal hematoma. Disc levels:  No acute findings.  Multilevel degenerative changes. Upper chest: Biapical subpleural scarring. Other: Bilateral carotid bulb calcified plaques. IMPRESSION: 1. No acute intracranial pathology. Moderate age-related atrophy and chronic microvascular ischemic changes. 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 2. No acute/traumatic cervical spine pathology. Electronically Signed   By:  Elgie Collard M.D.   On: 10/13/2023 21:06    Assessment/Plan There are no diagnoses linked to this encounter.   Family/ staff Communication: ***  Labs/tests ordered:  ***

## 2023-10-22 ENCOUNTER — Non-Acute Institutional Stay: Payer: Medicare Other | Admitting: Internal Medicine

## 2023-10-22 ENCOUNTER — Encounter: Payer: Self-pay | Admitting: Internal Medicine

## 2023-10-22 VITALS — BP 110/70 | HR 82 | Temp 97.9°F | Resp 17 | Ht 67.0 in | Wt 162.0 lb

## 2023-10-22 DIAGNOSIS — E039 Hypothyroidism, unspecified: Secondary | ICD-10-CM

## 2023-10-22 DIAGNOSIS — I10 Essential (primary) hypertension: Secondary | ICD-10-CM

## 2023-10-22 DIAGNOSIS — M25531 Pain in right wrist: Secondary | ICD-10-CM

## 2023-10-22 DIAGNOSIS — R4189 Other symptoms and signs involving cognitive functions and awareness: Secondary | ICD-10-CM

## 2023-10-22 DIAGNOSIS — W19XXXD Unspecified fall, subsequent encounter: Secondary | ICD-10-CM

## 2023-10-22 DIAGNOSIS — N1832 Chronic kidney disease, stage 3b: Secondary | ICD-10-CM

## 2023-10-22 DIAGNOSIS — L209 Atopic dermatitis, unspecified: Secondary | ICD-10-CM | POA: Diagnosis not present

## 2023-10-22 NOTE — Patient Instructions (Signed)
Stop Cozaar/Losartan Check BP once a week with Nurse I am making Therapy referal

## 2023-10-25 DIAGNOSIS — R2689 Other abnormalities of gait and mobility: Secondary | ICD-10-CM | POA: Diagnosis not present

## 2023-10-25 DIAGNOSIS — M6259 Muscle wasting and atrophy, not elsewhere classified, multiple sites: Secondary | ICD-10-CM | POA: Diagnosis not present

## 2023-10-25 DIAGNOSIS — Z9181 History of falling: Secondary | ICD-10-CM | POA: Diagnosis not present

## 2023-10-25 NOTE — Progress Notes (Signed)
 Location: [facility] of Service:  Clinic (12)  Provider:   Code Status:  Goals of Care:     10/22/2023    1:04 PM  Advanced Directives  Does Patient Have a Medical Advance Directive? Yes  Type of Estate Agent of [place];Living will  Does patient want to make changes to medical advance directive? No - Patient declined  Copy of Healthcare Power of Attorney in Chart? No - copy requested  Would patient like information on creating a medical advance directive? No - Patient declined     Chief Complaint  Patient presents with   Acute Visit    Patient states he is here for a fall that he had     HPI: Patient is a 89 y.o. male seen today for an acute visit for Follow up after a fall  [place] in [place]   Was in ED in after a fall He says he fell on the side walk Lost his balance No Dizziness or Syncope ED CT scan of head and Neck Negative  He still has swelling in his Left Forehead and Bruise on his left eye  In Last few months nurse in the facility has also noticed patient having increased cognitive impairment.  Patient said yet agreed that he is having some memory issues. He is still walking without any cane or walker.  Does want therapy to evaluate him.   Other issues He has h/o Arthritis Takes Relafen  for past 8-9 years. Tapering has not helped LE edema  BPH with Frequency  Recent Inguinal Hernia repair  H/o Right Shoulder Arthroplasty MVR repair CKD Stage 3 A Left TKA 06/08/22  Past Medical History:  Diagnosis Date   Aortoiliac occlusive disease (HCC)    Carotid artery disease (HCC)    Complication of anesthesia 1955   sodium pentathol ?, N/V   Contracture of palmar fascia 06/21/2010   Dysrhythmia    PAF   Enthesopathy of hip region 11/27/2009   Essential hypertension, benign 06/21/2010   GERD (gastroesophageal reflux disease)    occ   Head injury with loss of consciousness (HCC) 2001   short period    Heart murmur    Hyperlipidemia LDL goal <100 06/21/2010   Hypertrophy of prostate without urinary obstruction and other lower urinary tract symptoms (LUTS) 06/21/2010   Hypothyroidism 08/27/2011   Malignant neoplasm of prostate (HCC) 06/21/2010   Mitral regurgitation 10/07/2015   Severe MR noted on ECHO with partially flail post leaflet Oct 03 2015    Osteoarthrosis, unspecified whether generalized or localized, unspecified site 06/21/2010   Other and unspecified hyperlipidemia 06/21/2010   Pain in joint, lower leg 02/25/2012   Palpitations 08/14/2010   Patent foramen ovale 01/04/2016   Closed at the time of mitral valve repair    PONV (postoperative nausea and vomiting)    S/P minimally invasive mitral valve repair 01/04/2016   Complex valvuloplasty including triangular resection of flail segment of posterior leaflet, artificial Gore-tex neochord placement x6 and 32mm Sorin Memo 3D ring annuloplasty via right mini thoracotomy approach with closure of PFO and clipping of LA appendage    Unspecified constipation 06/21/2010   Unspecified essential hypertension 06/21/2010   Unspecified hypothyroidism 08/27/2011   Urinary frequency 08/14/2008    Past Surgical History:  Procedure Laterality Date   CARDIAC CATHETERIZATION N/A 11/10/2015   Procedure: Right/Left Heart Cath and Coronary Angiography;  Surgeon: Peter M Jordan, MD;  Location: [facility] INVASIVE CV LAB;  Service:  Cardiovascular;  Laterality: N/A;   CATARACT EXTRACTION W/ INTRAOCULAR LENS  IMPLANT, BILATERAL  2006   CLIPPING OF ATRIAL APPENDAGE N/A 01/04/2016   Procedure: CLIPPING OF ATRIAL APPENDAGE;  Surgeon: Sudie VEAR Laine, MD;  Location: [facility] OR;  Service: Open Heart Surgery;  Laterality: N/A;   COLONOSCOPY     CORONARY ANGIOPLASTY  2017   Dr Jordan   INGUINAL HERNIA REPAIR Left 12/19/2020   Procedure: LEFT OPEN INGUINAL HERNIA REPAIR WITHOUT MESH;  Surgeon: Kinsinger, Herlene Righter, MD;  Location: [facility] [place];  Service: General;   Laterality: Left;  ROOM 3 STARTING AT 11:30AM FOR 60 MIN   KNEE ARTHROSCOPY WITH MENISCAL REPAIR Left 1968   MITRAL VALVE REPAIR Right 01/04/2016   Procedure: MINIMALLY INVASIVE MITRAL VALVE REPAIR (MVR);  Surgeon: Sudie VEAR Laine, MD;  Location: [facility] OR;  Service: Open Heart Surgery;  Laterality: Right;   Open knee- ligament repair Right 1955   Dr. Dessa   PROSTATE SURGERY  2000   seed implants   REVERSE SHOULDER ARTHROPLASTY Right 08/12/2020   Procedure: REVERSE SHOULDER ARTHROPLASTY;  Surgeon: Melita Drivers, MD;  Location: [facility] OR;  Service: Orthopedics;  Laterality: Right;    TEE WITHOUT CARDIOVERSION N/A 11/10/2015   Procedure: TRANSESOPHAGEAL ECHOCARDIOGRAM (TEE);  Surgeon: Annabella Scarce, MD;  Location: [facility] ENDOSCOPY;  Service: Cardiovascular;  Laterality: N/A;   TEE WITHOUT CARDIOVERSION N/A 01/04/2016   Procedure: TRANSESOPHAGEAL ECHOCARDIOGRAM (TEE);  Surgeon: Sudie VEAR Laine, MD;  Location: [facility] OR;  Service: Open Heart Surgery;  Laterality: N/A;   TOTAL KNEE ARTHROPLASTY Left 06/08/2022   Procedure: LEFT TOTAL KNEE ARTHROPLASTY;  Surgeon: Kay Kemps, MD;  Location: [facility] ORS;  Service: Orthopedics;  Laterality: Left;    Allergies  Allergen Reactions   Shingrix [Zoster Vac Recomb Adjuvanted]     Outpatient Encounter Medications as of 10/22/2023  Medication Sig   acetaminophen  (TYLENOL ) 325 MG tablet Take 2 tablets (650 mg total) by mouth every 6 (six) hours as needed.   alfuzosin  (UROXATRAL ) 10 MG 24 hr tablet Take 1 tablet (10 mg total) by mouth daily with breakfast.   amoxicillin  (AMOXIL ) 250 MG chewable tablet Chew 500 mg by mouth as needed. 2000mg  1x one hour prior to dental procedures.   DUPIXENT  300 MG/2ML SOPN INJECT 300 MG UNDER THE SKIN EVERY 14 DAYS   latanoprost (XALATAN) 0.005 % ophthalmic solution Place 1 drop into both eyes at bedtime.   Levothyroxine  Sodium (TIROSINT ) 50 MCG CAPS TAKE 1 CAPSULE DAILY BEFORE BREAKFAST   nabumetone  (RELAFEN ) 750 MG tablet TAKE  1 TABLET TWICE A DAY   polyethylene glycol (MIRALAX  / GLYCOLAX ) 17 g packet Take 17 g by mouth daily as needed for mild constipation.   simvastatin  (ZOCOR ) 20 MG tablet TAKE 1 TABLET AT BEDTIME   Timolol Maleate, Once-Daily, 0.5 % SOLN Apply 1 drop to eye daily. Left eye every morning   torsemide  (DEMADEX ) 20 MG tablet Take 0.5 tablets (10 mg total) by mouth daily as needed. Take it as needed for LE swelling   VESICARE 5 MG tablet Take 5 mg by mouth daily.   [DISCONTINUED] losartan  (COZAAR ) 25 MG tablet Take 0.5 tablets (12.5 mg total) by mouth daily.   lidocaine  (LIDODERM ) 5 % Place 1 patch onto the skin daily as needed. Remove & Discard patch within 12 hours or as directed by MD (Patient not taking: Reported on 10/22/2023)   nitroGLYCERIN  (NITROSTAT ) 0.4 MG SL tablet Place 1 tablet (0.4 mg total) under the tongue every 5 (five)  minutes as needed for chest pain. (Patient not taking: Reported on 10/22/2023)   No facility-administered encounter medications on file as of 10/22/2023.    Review of Systems:  Review of Systems  Constitutional:  Negative for activity change, appetite change and unexpected weight change.  HENT: Negative.    Respiratory:  Negative for cough and shortness of breath.   Cardiovascular:  Negative for leg swelling.  Gastrointestinal:  Negative for constipation.  Genitourinary:  Negative for frequency.  Musculoskeletal:  Positive for gait problem. Negative for arthralgias and myalgias.  Skin: Negative.  Negative for rash.  Neurological:  Negative for dizziness and weakness.  Psychiatric/Behavioral:  Positive for confusion. Negative for sleep disturbance.   All other systems reviewed and are negative.   Health Maintenance  Topic Date Due   Medicare Annual Wellness (AWV)  08/26/2021   COVID-19 Vaccine (6 - 2024-25 season) 11/07/2023 (Originally 09/20/2023)   DTaP/Tdap/Td (4 - Td or Tdap) 10/12/2033   Pneumonia Vaccine 49+ Years old  Completed   INFLUENZA VACCINE   Completed   Zoster Vaccines- Shingrix  Completed   HPV VACCINES  Aged Out    Physical Exam: Vitals:   10/22/23 1257  BP: 110/70  Pulse: 82  Resp: 17  Temp: 97.9 F (36.6 C)  TempSrc: Temporal  SpO2: 97%  Weight: 162 lb (73.5 kg)  Height: 5' 7 (1.702 m)   Body mass index is 25.37 kg/m. Physical Exam Vitals reviewed.  Constitutional:      Appearance: Normal appearance.  HENT:     Head: Normocephalic.     Comments: Had Lump on his Left side forehead and Bruised eyes    Nose: Nose normal.     Mouth/Throat:     Mouth: Mucous membranes are moist.     Pharynx: Oropharynx is clear.  Eyes:     Pupils: Pupils are equal, round, and reactive to light.  Cardiovascular:     Rate and Rhythm: Normal rate and regular rhythm.     Pulses: Normal pulses.     Heart sounds: No murmur heard. Pulmonary:     Effort: Pulmonary effort is normal. No respiratory distress.     Breath sounds: Normal breath sounds. No rales.  Abdominal:     General: Abdomen is flat. Bowel sounds are normal.     Palpations: Abdomen is soft.  Musculoskeletal:        General: No swelling.     Cervical back: Neck supple.     Comments: Mild Swelling Bilateral  Right hand was not swollen anymore  Skin:    General: Skin is warm.  Neurological:     General: No focal deficit present.     Mental Status: He is alert and oriented to person, place, and time.     Comments: Gait was stable  Psychiatric:        Mood and Affect: Mood normal.        Thought Content: Thought content normal.     Labs reviewed: Basic Metabolic Panel: Recent Labs    01/17/23 0000 07/23/23 0000 09/24/23 0000  NA 137 140 138  K 4.5 4.3 4.3  CL 103 104 102  CO2 24* 24* 24*  BUN  --  30* 24*  CREATININE 1.4* 1.7* 1.3  CALCIUM 9.1 8.9 9.2  TSH 5.41 5.07  --    Liver Function Tests: Recent Labs    07/23/23 0000  AST 36  ALT 28  ALKPHOS 87  ALBUMIN  3.9   No results for input(s): LIPASE,  AMYLASE in the last 8760  hours. No results for input(s): AMMONIA in the last 8760 hours. CBC: Recent Labs    07/23/23 0000  WBC 4.6  HGB 13.0*  HCT 38*  PLT 224   Lipid Panel: Recent Labs    01/17/23 0000 07/23/23 0000  CHOL 166 204*  HDL 61 57  LDLCALC 87 131  TRIG 88 83   Lab Results  Component Value Date   HGBA1C 5.5 01/02/2016    Procedures since last visit: DG Shoulder Left Result Date: 10/13/2023 CLINICAL DATA:  Fall, pain EXAM: LEFT SHOULDER - 2+ VIEW COMPARISON:  04/26/2023 FINDINGS: There is no evidence of fracture or dislocation. There is no evidence of arthropathy or other focal bone abnormality. Soft tissues are unremarkable. IMPRESSION: Negative. Electronically Signed   By: Franky Crease M.D.   On: 10/13/2023 21:17   DG Humerus Left Result Date: 10/13/2023 CLINICAL DATA:  Fall, pain EXAM: LEFT HUMERUS - 2+ VIEW COMPARISON:  04/26/2023 FINDINGS: There is no evidence of fracture or other focal bone lesions. Soft tissues are unremarkable. IMPRESSION: Negative. Electronically Signed   By: Franky Crease M.D.   On: 10/13/2023 21:16   DG Chest Portable 1 View Result Date: 10/13/2023 CLINICAL DATA:  Left upper extremity pain, fall EXAM: PORTABLE CHEST 1 VIEW COMPARISON:  04/26/2023 FINDINGS: Cardiomegaly. Aortic atherosclerosis. No confluent opacities, effusions or pneumothorax. No acute bony abnormality. Multiple old right rib fractures noted. IMPRESSION: Mild cardiomegaly. No acute cardiopulmonary disease. Electronically Signed   By: Franky Crease M.D.   On: 10/13/2023 21:16   CT Head Wo Contrast Result Date: 10/13/2023 CLINICAL DATA:  Trauma. EXAM: CT HEAD WITHOUT CONTRAST CT CERVICAL SPINE WITHOUT CONTRAST TECHNIQUE: Multidetector CT imaging of the head and cervical spine was performed following the standard protocol without intravenous contrast. Multiplanar CT image reconstructions of the cervical spine were also generated. RADIATION DOSE REDUCTION: This exam was performed according to the  departmental dose-optimization program which includes automated exposure control, adjustment of the mA and/or kV according to patient size and/or use of iterative reconstruction technique. COMPARISON:  None Available. FINDINGS: CT HEAD FINDINGS Brain: Moderate age-related atrophy and chronic microvascular ischemic changes. Old right occipital infarct as well as several small foci of old right cerebellar infarcts. There is no acute intracranial hemorrhage. No mass effect or midline shift. No extra-axial fluid collection. Vascular: No hyperdense vessel or unexpected calcification. Skull: Normal. Negative for fracture or focal lesion. Sinuses/Orbits: No acute finding. Other: Left forehead and periorbital hematoma. CT CERVICAL SPINE FINDINGS Alignment: No acute subluxation. Skull base and vertebrae: No acute fracture.  Osteopenia. Soft tissues and spinal canal: No prevertebral fluid or swelling. No visible canal hematoma. Disc levels:  No acute findings.  Multilevel degenerative changes. Upper chest: Biapical subpleural scarring. Other: Bilateral carotid bulb calcified plaques. IMPRESSION: 1. No acute intracranial pathology. Moderate age-related atrophy and chronic microvascular ischemic changes. 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 2. No acute/traumatic cervical spine pathology. Electronically Signed   By: Vanetta Chou M.D.   On: 10/13/2023 21:06   CT Cervical Spine Wo Contrast Result Date: 10/13/2023 CLINICAL DATA:  Trauma. EXAM: CT HEAD WITHOUT CONTRAST CT CERVICAL SPINE WITHOUT CONTRAST TECHNIQUE: Multidetector CT imaging of the head and cervical spine was performed following the standard protocol without intravenous contrast. Multiplanar CT image reconstructions of the cervical spine were also generated. RADIATION DOSE REDUCTION: This exam was performed according to the departmental dose-optimization program which includes automated exposure control, adjustment of the mA and/or kV according  to  patient size and/or use of iterative reconstruction technique. COMPARISON:  None Available. FINDINGS: CT HEAD FINDINGS Brain: Moderate age-related atrophy and chronic microvascular ischemic changes. Old right occipital infarct as well as several small foci of old right cerebellar infarcts. There is no acute intracranial hemorrhage. No mass effect or midline shift. No extra-axial fluid collection. Vascular: No hyperdense vessel or unexpected calcification. Skull: Normal. Negative for fracture or focal lesion. Sinuses/Orbits: No acute finding. Other: Left forehead and periorbital hematoma. CT CERVICAL SPINE FINDINGS Alignment: No acute subluxation. Skull base and vertebrae: No acute fracture.  Osteopenia. Soft tissues and spinal canal: No prevertebral fluid or swelling. No visible canal hematoma. Disc levels:  No acute findings.  Multilevel degenerative changes. Upper chest: Biapical subpleural scarring. Other: Bilateral carotid bulb calcified plaques. IMPRESSION: 1. No acute intracranial pathology. Moderate age-related atrophy and chronic microvascular ischemic changes. 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 2. No acute/traumatic cervical spine pathology. Electronically Signed   By: Vanetta Chou M.D.   On: 10/13/2023 21:06    Assessment/Plan 1. Fall, subsequent encounter (Primary) Therapy referral Made  2. Right wrist pain No Swelling   3. Stage 3b chronic kidney disease (HCC) Repeat labs    4. Cognitive impairment MMSE next visit  5. Essential hypertension, benign His BP is running low and was low in ED Will discontinue Cozaar  He will monitor his BP with Facility Nurse 6. Hypothyroidism, unspecified type TSH normal in 10/24   7 BPH On Uroxatral  and vesicare   Labs/tests ordered:   Next appt:  11/19/2023

## 2023-11-18 ENCOUNTER — Encounter: Payer: Self-pay | Admitting: Internal Medicine

## 2023-11-19 ENCOUNTER — Encounter: Payer: Medicare Other | Admitting: Internal Medicine

## 2023-11-19 ENCOUNTER — Encounter: Payer: Self-pay | Admitting: Internal Medicine

## 2023-11-19 ENCOUNTER — Non-Acute Institutional Stay: Payer: Medicare Other | Admitting: Internal Medicine

## 2023-11-19 VITALS — BP 120/80 | HR 80 | Temp 97.6°F | Resp 18 | Ht 67.0 in | Wt 166.2 lb

## 2023-11-19 DIAGNOSIS — M25512 Pain in left shoulder: Secondary | ICD-10-CM | POA: Diagnosis not present

## 2023-11-19 DIAGNOSIS — R600 Localized edema: Secondary | ICD-10-CM

## 2023-11-19 DIAGNOSIS — L209 Atopic dermatitis, unspecified: Secondary | ICD-10-CM | POA: Diagnosis not present

## 2023-11-19 DIAGNOSIS — M150 Primary generalized (osteo)arthritis: Secondary | ICD-10-CM

## 2023-11-19 DIAGNOSIS — E039 Hypothyroidism, unspecified: Secondary | ICD-10-CM | POA: Diagnosis not present

## 2023-11-19 DIAGNOSIS — E785 Hyperlipidemia, unspecified: Secondary | ICD-10-CM

## 2023-11-19 DIAGNOSIS — R4189 Other symptoms and signs involving cognitive functions and awareness: Secondary | ICD-10-CM

## 2023-11-19 MED ORDER — HYDROCORTISONE 2.5 % EX CREA: TOPICAL_CREAM | Freq: Two times a day (BID) | CUTANEOUS | 0 refills | Status: DC

## 2023-11-19 NOTE — Patient Instructions (Addendum)
Stop taking Vesicare Do not take Nabumetone unless you need for pain Look bottles for Simvastatin and Levothyroxine I am calling in Steroid Cream for your neck to apply twice a day till it gets better

## 2023-11-21 NOTE — Progress Notes (Signed)
Location: [facility] features editor of Service:  Clinic (12)  Provider:   Code Status:  Goals of Care:     11/19/2023   11:26 AM  Advanced Directives  Does Patient Have a Medical Advance Directive? Yes  Type of Estate agent of [place];Living will  Does patient want to make changes to medical advance directive? No - Patient declined  Copy of Healthcare Power of Attorney in Chart? No - copy requested  Would patient like information on creating a medical advance directive? No - Patient declined     Chief Complaint  Patient presents with   Medical Management of Chronic Issues    4 month follow up. Patient has no concerns    HPI: Patient is a 89 y.o. male seen today for an acute visit for Follow up   [place] in [place]    Patient Was in ED in after a fall on 12/22 He says he fell on the side walk Lost his balance No Dizziness or Syncope ED CT scan of head and Neck Negative  He is now for  follow up after last visit  Discussed the use of AI scribe software for clinical note transcription with the patient, who gave verbal consent to proceed.  History of Present Illness    Cognitive impairment The patient presents with memory decline and medication management issues.  He experiences memory decline, often forgetting tasks and needing to write them down.  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 CT in ED Moderate Atrophy with chronic microvascular ischemic changes. 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  He is currently taking Uroxatral once daily,   He was previously on Flomax but is no longer taking it.   He does not recall being told to stop taking his cholesterol medication, Zocor (simvastatin), or his thyroid medication, Synthroid (levothyroxine), but he is not currently taking them.  He also mentions not taking Dupixent anymore and  reports no rash or itching, although he experiences some itching on his neck.  LE edema Stopped Torsemide himself and does not want to take it Swelling does not bother him  He has a history of prostate cancer diagnosed 15 years ago and was previously under the care of a urologist, Dr. Heloise Purpura, whom he has not seen in two years. He was prescribed VESIcare by his urologist but is not currently taking it. No issues with frequent urination or dry mouth,   Socially, he lives alone and drives himself to the grocery store and church. His daughter lives in [place], [place], and he has no family nearby. He has been living at [place] since 2011, where he and his late wife resided until her passing two years ago. He eats primarily at the [employer] or main dining room at [employer].      Previous History  He has h/o Arthritis Takes Relafen for past 8-9 years. Tapering has not helped LE edema  BPH with Frequency  Inguinal Hernia repair  H/o Right Shoulder Arthroplasty MVR repair CKD Stage 3 A Left TKA 06/08/22 Past Medical History:  Diagnosis Date   Aortoiliac occlusive disease (HCC)    Carotid artery disease (HCC)    Complication of anesthesia 1955   sodium pentathol ?, N/V   Contracture of palmar fascia 06/21/2010   Dysrhythmia    PAF   Enthesopathy of hip region 11/27/2009   Essential hypertension, benign 06/21/2010  GERD (gastroesophageal reflux disease)    occ   Head injury with loss of consciousness (HCC) 2001   short period   Heart murmur    Hyperlipidemia LDL goal <100 06/21/2010   Hypertrophy of prostate without urinary obstruction and other lower urinary tract symptoms (LUTS) 06/21/2010   Hypothyroidism 08/27/2011   Malignant neoplasm of prostate (HCC) 06/21/2010   Mitral regurgitation 10/07/2015   Severe MR noted on ECHO with partially flail post leaflet Oct 03 2015    Osteoarthrosis, unspecified whether generalized or localized, unspecified site 06/21/2010   Other and  unspecified hyperlipidemia 06/21/2010   Pain in joint, lower leg 02/25/2012   Palpitations 08/14/2010   Patent foramen ovale 01/04/2016   Closed at the time of mitral valve repair    PONV (postoperative nausea and vomiting)    S/P minimally invasive mitral valve repair 01/04/2016   Complex valvuloplasty including triangular resection of flail segment of posterior leaflet, artificial Gore-tex neochord placement x6 and 32mm Sorin Memo 3D ring annuloplasty via right mini thoracotomy approach with closure of PFO and clipping of LA appendage    Unspecified constipation 06/21/2010   Unspecified essential hypertension 06/21/2010   Unspecified hypothyroidism 08/27/2011   Urinary frequency 08/14/2008    Past Surgical History:  Procedure Laterality Date   CARDIAC CATHETERIZATION N/A 11/10/2015   Procedure: Right/Left Heart Cath and Coronary Angiography;  Surgeon: Peter M Swaziland, MD;  Location: [facility] INVASIVE CV LAB;  Service: Cardiovascular;  Laterality: N/A;   CATARACT EXTRACTION W/ INTRAOCULAR LENS  IMPLANT, BILATERAL  2006   CLIPPING OF ATRIAL APPENDAGE N/A 01/04/2016   Procedure: CLIPPING OF ATRIAL APPENDAGE;  Surgeon: Purcell Nails, MD;  Location: [facility] OR;  Service: Open Heart Surgery;  Laterality: N/A;   COLONOSCOPY     CORONARY ANGIOPLASTY  2017   Dr Swaziland   INGUINAL HERNIA REPAIR Left 12/19/2020   Procedure: LEFT OPEN INGUINAL HERNIA REPAIR WITHOUT MESH;  Surgeon: Kinsinger, De Blanch, MD;  Location: [facility];  Service: General;  Laterality: Left;  ROOM 3 STARTING AT 11:30AM FOR 60 MIN   KNEE ARTHROSCOPY WITH MENISCAL REPAIR Left 1968   MITRAL VALVE REPAIR Right 01/04/2016   Procedure: MINIMALLY INVASIVE MITRAL VALVE REPAIR (MVR);  Surgeon: Purcell Nails, MD;  Location: [facility] OR;  Service: Open Heart Surgery;  Laterality: Right;   Open knee- ligament repair Right 1955   Dr. Sandra Cockayne   PROSTATE SURGERY  2000   seed implants   REVERSE SHOULDER ARTHROPLASTY Right 08/12/2020    Procedure: REVERSE SHOULDER ARTHROPLASTY;  Surgeon: Francena Hanly, MD;  Location: [facility] OR;  Service: Orthopedics;  Laterality: Right;    TEE WITHOUT CARDIOVERSION N/A 11/10/2015   Procedure: TRANSESOPHAGEAL ECHOCARDIOGRAM (TEE);  Surgeon: Chilton Si, MD;  Location: [facility] ENDOSCOPY;  Service: Cardiovascular;  Laterality: N/A;   TEE WITHOUT CARDIOVERSION N/A 01/04/2016   Procedure: TRANSESOPHAGEAL ECHOCARDIOGRAM (TEE);  Surgeon: Purcell Nails, MD;  Location: [facility] OR;  Service: Open Heart Surgery;  Laterality: N/A;   TOTAL KNEE ARTHROPLASTY Left 06/08/2022   Procedure: LEFT TOTAL KNEE ARTHROPLASTY;  Surgeon: Beverely Low, MD;  Location: [facility] ORS;  Service: Orthopedics;  Laterality: Left;    Allergies  Allergen Reactions   Shingrix [Zoster Vac Recomb Adjuvanted]     Outpatient Encounter Medications as of 11/19/2023  Medication Sig   acetaminophen (TYLENOL) 325 MG tablet Take 2 tablets (650 mg total) by mouth every 6 (six) hours as needed.   alfuzosin (UROXATRAL) 10 MG 24 hr tablet Take 1 tablet (  10 mg total) by mouth daily with breakfast.   amoxicillin (AMOXIL) 250 MG chewable tablet Chew 500 mg by mouth as needed. 2000mg  1x one hour prior to dental procedures.   hydrocortisone 2.5 % cream Apply topically 2 (two) times daily. Apply till rash is resolved   latanoprost (XALATAN) 0.005 % ophthalmic solution Place 1 drop into both eyes at bedtime.   nabumetone (RELAFEN) 750 MG tablet TAKE 1 TABLET TWICE A DAY (Patient taking differently: Take 750 mg by mouth daily.)   polyethylene glycol (MIRALAX / GLYCOLAX) 17 g packet Take 17 g by mouth daily as needed for mild constipation.   Timolol Maleate, Once-Daily, 0.5 % SOLN Apply 1 drop to eye daily. Left eye every morning   DUPIXENT 300 MG/2ML SOPN INJECT 300 MG UNDER THE SKIN EVERY 14 DAYS (Patient not taking: Reported on 11/19/2023)   Levothyroxine Sodium (TIROSINT) 50 MCG CAPS TAKE 1 CAPSULE DAILY BEFORE BREAKFAST (Patient not taking: Reported on  11/19/2023)   nitroGLYCERIN (NITROSTAT) 0.4 MG SL tablet Place 1 tablet (0.4 mg total) under the tongue every 5 (five) minutes as needed for chest pain. (Patient not taking: Reported on 11/19/2023)   simvastatin (ZOCOR) 20 MG tablet TAKE 1 TABLET AT BEDTIME (Patient not taking: Reported on 11/19/2023)   VESICARE 5 MG tablet Take 5 mg by mouth daily. (Patient not taking: Reported on 11/19/2023)   No facility-administered encounter medications on file as of 11/19/2023.    Review of Systems:  Review of Systems  Constitutional:  Negative for activity change, appetite change and unexpected weight change.  HENT: Negative.    Respiratory:  Negative for cough and shortness of breath.   Cardiovascular:  Positive for leg swelling.  Gastrointestinal:  Negative for constipation.  Genitourinary:  Negative for frequency.  Musculoskeletal:  Negative for arthralgias, gait problem and myalgias.  Skin: Negative.  Negative for rash.  Neurological:  Negative for dizziness and weakness.  Psychiatric/Behavioral:  Positive for confusion. Negative for sleep disturbance.   All other systems reviewed and are negative.   Health Maintenance  Topic Date Due   Medicare Annual Wellness (AWV)  08/26/2021   COVID-19 Vaccine (6 - 2024-25 season) 09/20/2023   DTaP/Tdap/Td (4 - Td or Tdap) 10/12/2033   Pneumonia Vaccine 60+ Years old  Completed   INFLUENZA VACCINE  Completed   Zoster Vaccines- Shingrix  Completed   HPV VACCINES  Aged Out    Physical Exam: Vitals:   11/19/23 1126  BP: 120/80  Pulse: 80  Resp: 18  Temp: 97.6 F (36.4 C)  TempSrc: Temporal  SpO2: 97%  Weight: 166 lb 3.2 oz (75.4 kg)  Height: 5\' 7"  (1.702 m)   Body mass index is 26.03 kg/m. Physical Exam Vitals reviewed.  Constitutional:      Appearance: Normal appearance.  HENT:     Head: Normocephalic.     Comments: Mild Bruise on His Left side    Nose: Nose normal.     Mouth/Throat:     Mouth: Mucous membranes are moist.      Pharynx: Oropharynx is clear.  Eyes:     Pupils: Pupils are equal, round, and reactive to light.  Cardiovascular:     Rate and Rhythm: Normal rate and regular rhythm.     Pulses: Normal pulses.     Heart sounds: No murmur heard. Pulmonary:     Effort: Pulmonary effort is normal. No respiratory distress.     Breath sounds: Normal breath sounds. No rales.  Abdominal:  General: Abdomen is flat. Bowel sounds are normal.     Palpations: Abdomen is soft.  Musculoskeletal:        General: Swelling present.     Cervical back: Neck supple.     Comments: Pain in His Left Shoulder  Was unable to lift his Left Arm due to pain  Skin:    General: Skin is warm.  Neurological:     General: No focal deficit present.     Mental Status: He is alert and oriented to person, place, and time.  Psychiatric:        Mood and Affect: Mood normal.        Thought Content: Thought content normal.       11/19/2023   11:32 AM 02/18/2018   11:14 AM 12/12/2016    2:35 PM  MMSE - Mini Mental State Exam  Orientation to time 3 5 5   Orientation to Place 5 5 5   Registration 3 3 3   Attention/ Calculation 5 4 5   Recall 2 2 1   Language- name 2 objects 2 2 2   Language- repeat 1 1 1   Language- follow 3 step command 3 3 3   Language- read & follow direction 1 1 1   Write a sentence 1 1 1   Copy design 1 1 1   Total score 27 28 28      Labs reviewed: Basic Metabolic Panel: Recent Labs    01/17/23 0000 07/23/23 0000 09/24/23 0000  NA 137 140 138  K 4.5 4.3 4.3  CL 103 104 102  CO2 24* 24* 24*  BUN  --  30* 24*  CREATININE 1.4* 1.7* 1.3  CALCIUM 9.1 8.9 9.2  TSH 5.41 5.07  --    Liver Function Tests: Recent Labs    07/23/23 0000  AST 36  ALT 28  ALKPHOS 87  ALBUMIN 3.9   No results for input(s): "LIPASE", "AMYLASE" in the last 8760 hours. No results for input(s): "AMMONIA" in the last 8760 hours. CBC: Recent Labs    07/23/23 0000  WBC 4.6  HGB 13.0*  HCT 38*  PLT 224   Lipid  Panel: Recent Labs    01/17/23 0000 07/23/23 0000  CHOL 166 204*  HDL 61 57  LDLCALC 87 131  TRIG 88 83   Lab Results  Component Value Date   HGBA1C 5.5 01/02/2016    Procedures since last visit: No results found.  Assessment/Plan  Acute pain of left shoulder (Primary) Xray was negative in ED  - Ambulatory referral to Orthopedic Surgery      Cognitive decline Patient reports subjective memory decline and has difficulty with date and season during cognitive testing. However, patient is still able to manage finances independently. -Continue to monitor cognitive function. MMSE today 27/30 Passed Clock I have talked to facility Nurse to continue monitoring him He is refusing to move to [place] or do Med management  Medication Management Patient has stopped taking several medications including VESIcare, cholesterol medication, and thyroid medication. Patient also inconsistently takes arthritis medication. -Discontinue VESIcare as patient reports no urinary symptoms. -Check blood work including cholesterol and thyroid levels. -Advise patient to take arthritis medication as needed for pain. -Plan to review medication regimen again in a couple of weeks after blood work results.  Skin Itching Patient reports itching at the back of the neck. -Prescribe hydrocortisone cream for itching. -Advise patient to restart Dupixent if itching becomes generalized.  Prostate Follow-up Patient has not seen urologist in two years and has a history  of prostate cancer. -Advise patient to schedule follow-up appointment with urologist. -Order prostate test with upcoming blood work.  Lower Extremity Edema Patient has lower extremity edema but reports no discomfort or other symptoms. -Monitor edema and consider medication adjustment if it becomes symptomatic.  Follow-up Multiple issues to address including medication management, memory decline, and prostate follow-up. -Schedule follow-up  appointment in a couple of weeks.        Labs/tests ordered:  CBC,,PSA, Lipid,TSH Next appt:  12/17/2023

## 2023-11-22 NOTE — Progress Notes (Signed)
 A user error has taken place.

## 2023-12-03 DIAGNOSIS — M19012 Primary osteoarthritis, left shoulder: Secondary | ICD-10-CM | POA: Diagnosis not present

## 2023-12-05 DIAGNOSIS — D631 Anemia in chronic kidney disease: Secondary | ICD-10-CM | POA: Diagnosis not present

## 2023-12-05 DIAGNOSIS — I020 Rheumatic chorea with heart involvement: Secondary | ICD-10-CM | POA: Diagnosis not present

## 2023-12-05 DIAGNOSIS — N183 Chronic kidney disease, stage 3 unspecified: Secondary | ICD-10-CM | POA: Diagnosis not present

## 2023-12-05 DIAGNOSIS — E039 Hypothyroidism, unspecified: Secondary | ICD-10-CM | POA: Diagnosis not present

## 2023-12-05 DIAGNOSIS — D500 Iron deficiency anemia secondary to blood loss (chronic): Secondary | ICD-10-CM | POA: Diagnosis not present

## 2023-12-05 DIAGNOSIS — E02 Subclinical iodine-deficiency hypothyroidism: Secondary | ICD-10-CM | POA: Diagnosis not present

## 2023-12-05 LAB — CBC AND DIFFERENTIAL
HCT: 38 — AB (ref 41–53)
Hemoglobin: 12.7 — AB (ref 13.5–17.5)
Platelets: 236 K/uL (ref 150–400)
WBC: 3.9

## 2023-12-05 LAB — LIPID PANEL
Cholesterol: 196 (ref 0–200)
HDL: 72 — AB (ref 35–70)
LDL Cholesterol: 108
LDl/HDL Ratio: 2.7
Triglycerides: 81 (ref 40–160)

## 2023-12-05 LAB — PSA, TOTAL AND FREE
PSA, FREE: 0.01
PSA, Total: 0.1

## 2023-12-05 LAB — TSH: TSH: 7.44 — AB (ref 0.41–5.90)

## 2023-12-05 LAB — CBC: RBC: 4.12 (ref 3.87–5.11)

## 2023-12-06 ENCOUNTER — Encounter: Payer: Self-pay | Admitting: Internal Medicine

## 2023-12-09 ENCOUNTER — Encounter: Payer: Self-pay | Admitting: Internal Medicine

## 2023-12-17 ENCOUNTER — Non-Acute Institutional Stay: Payer: Medicare Other | Admitting: Internal Medicine

## 2023-12-17 VITALS — BP 130/82 | HR 73 | Temp 98.0°F | Resp 17 | Ht 67.0 in | Wt 167.0 lb

## 2023-12-17 DIAGNOSIS — E039 Hypothyroidism, unspecified: Secondary | ICD-10-CM

## 2023-12-17 DIAGNOSIS — R600 Localized edema: Secondary | ICD-10-CM

## 2023-12-17 DIAGNOSIS — M150 Primary generalized (osteo)arthritis: Secondary | ICD-10-CM

## 2023-12-17 DIAGNOSIS — L209 Atopic dermatitis, unspecified: Secondary | ICD-10-CM

## 2023-12-17 DIAGNOSIS — E785 Hyperlipidemia, unspecified: Secondary | ICD-10-CM | POA: Diagnosis not present

## 2023-12-17 DIAGNOSIS — R4189 Other symptoms and signs involving cognitive functions and awareness: Secondary | ICD-10-CM

## 2023-12-17 DIAGNOSIS — I10 Essential (primary) hypertension: Secondary | ICD-10-CM | POA: Diagnosis not present

## 2023-12-17 MED ORDER — HYDROCORTISONE 2.5 % EX CREA: TOPICAL_CREAM | Freq: Two times a day (BID) | CUTANEOUS | Status: AC

## 2023-12-17 NOTE — Patient Instructions (Addendum)
 Take your Levothyroxine empty stomach and then not eat for 30 min  Take your Simvastatin every day Apply Hydrocortisone Ointment /cream is for you rash apply BID till it resolves

## 2023-12-22 NOTE — Progress Notes (Signed)
 Location: [facility] features editor of Service:  Clinic (12)  Provider:   Code Status:  Goals of Care:     12/17/2023    4:16 PM  Advanced Directives  Does Patient Have a Medical Advance Directive? Yes  Type of Estate agent of [place];Living will  Does patient want to make changes to medical advance directive? No - Patient declined  Copy of Healthcare Power of Attorney in Chart? No - copy requested  Would patient like information on creating a medical advance directive? No - Patient declined     Chief Complaint  Patient presents with   Medical Management of Chronic Issues    4 week follow up.  Discuss the need for AWV and covid.    HPI: Patient is a 89 y.o. male seen today for an acute visit for Follow up  [place]    Patient Was in ED in after a fall on 12/22 He says he fell on the side walk Lost his balance No Dizziness or Syncope ED CT scan of head and Neck Negative  Cognitive decline Patient has had recent Cognitive decline He forgot his Appointment today again and came 2 hours late He manages his own billing and finances without the oversight.  Does acknowledge that he has made some mistakes   CT in ED Moderate Atrophy with chronic microvascular ischemic changes. 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 He is not on med management and I feel like he is not taking his medications properly .  I told the nurse and [facility] to go and check and they found he has not been taking his statin or  his Synthroid.  Patient refuses medical management at this point. Refuses AL LE edema Stopped Torsemide himself and does not want to take it Swelling does not bother him Neck Rash He also mentions not taking Dupixent anymore     Socially, he lives alone and drives himself to the grocery store and church.   His daughter lives in [place], [place], and he has no family nearby. He has been living at [place] since  2011, where he and his late wife resided until her passing two years ago. He eats primarily at the [place] or main dining room at [employer].      Previous History   He has h/o Arthritis Takes Relafen for past 8-9 years. Tapering has not helped LE edema  BPH with Frequency  Inguinal Hernia repair  H/o Right Shoulder Arthroplasty MVR repair CKD Stage 3 A Left TKA 06/08/22 Past Medical History:  Diagnosis Date   Aortoiliac occlusive disease (HCC)    Carotid artery disease (HCC)    Complication of anesthesia 1955   sodium pentathol ?, N/V   Contracture of palmar fascia 06/21/2010   Dysrhythmia    PAF   Enthesopathy of hip region 11/27/2009   Essential hypertension, benign 06/21/2010   GERD (gastroesophageal reflux disease)    occ   Head injury with loss of consciousness (HCC) 2001   short period   Heart murmur    Hyperlipidemia LDL goal <100 06/21/2010   Hypertrophy of prostate without urinary obstruction and other lower urinary tract symptoms (LUTS) 06/21/2010   Hypothyroidism 08/27/2011   Malignant neoplasm of prostate (HCC) 06/21/2010   Mitral regurgitation 10/07/2015   Severe MR noted on ECHO with partially flail post leaflet Oct 03 2015    Osteoarthrosis, unspecified whether generalized or localized, unspecified site 06/21/2010   Other and unspecified  hyperlipidemia 06/21/2010   Pain in joint, lower leg 02/25/2012   Palpitations 08/14/2010   Patent foramen ovale 01/04/2016   Closed at the time of mitral valve repair    PONV (postoperative nausea and vomiting)    S/P minimally invasive mitral valve repair 01/04/2016   Complex valvuloplasty including triangular resection of flail segment of posterior leaflet, artificial Gore-tex neochord placement x6 and 32mm Sorin Memo 3D ring annuloplasty via right mini thoracotomy approach with closure of PFO and clipping of LA appendage    Unspecified constipation 06/21/2010   Unspecified essential hypertension 06/21/2010   Unspecified hypothyroidism  08/27/2011   Urinary frequency 08/14/2008    Past Surgical History:  Procedure Laterality Date   CARDIAC CATHETERIZATION N/A 11/10/2015   Procedure: Right/Left Heart Cath and Coronary Angiography;  Surgeon: Peter M Swaziland, MD;  Location: [facility] INVASIVE CV LAB;  Service: Cardiovascular;  Laterality: N/A;   CATARACT EXTRACTION W/ INTRAOCULAR LENS  IMPLANT, BILATERAL  2006   CLIPPING OF ATRIAL APPENDAGE N/A 01/04/2016   Procedure: CLIPPING OF ATRIAL APPENDAGE;  Surgeon: Purcell Nails, MD;  Location: [facility] OR;  Service: Open Heart Surgery;  Laterality: N/A;   COLONOSCOPY     CORONARY ANGIOPLASTY  2017   Dr Swaziland   INGUINAL HERNIA REPAIR Left 12/19/2020   Procedure: LEFT OPEN INGUINAL HERNIA REPAIR WITHOUT MESH;  Surgeon: Kinsinger, De Blanch, MD;  Location: [facility] [place];  Service: General;  Laterality: Left;  ROOM 3 STARTING AT 11:30AM FOR 60 MIN   KNEE ARTHROSCOPY WITH MENISCAL REPAIR Left 1968   MITRAL VALVE REPAIR Right 01/04/2016   Procedure: MINIMALLY INVASIVE MITRAL VALVE REPAIR (MVR);  Surgeon: Purcell Nails, MD;  Location: [facility] OR;  Service: Open Heart Surgery;  Laterality: Right;   Open knee- ligament repair Right 1955   Dr. Sandra Cockayne   PROSTATE SURGERY  2000   seed implants   REVERSE SHOULDER ARTHROPLASTY Right 08/12/2020   Procedure: REVERSE SHOULDER ARTHROPLASTY;  Surgeon: Francena Hanly, MD;  Location: [facility] OR;  Service: Orthopedics;  Laterality: Right;    TEE WITHOUT CARDIOVERSION N/A 11/10/2015   Procedure: TRANSESOPHAGEAL ECHOCARDIOGRAM (TEE);  Surgeon: Chilton Si, MD;  Location: [facility] ENDOSCOPY;  Service: Cardiovascular;  Laterality: N/A;   TEE WITHOUT CARDIOVERSION N/A 01/04/2016   Procedure: TRANSESOPHAGEAL ECHOCARDIOGRAM (TEE);  Surgeon: Purcell Nails, MD;  Location: [facility] OR;  Service: Open Heart Surgery;  Laterality: N/A;   TOTAL KNEE ARTHROPLASTY Left 06/08/2022   Procedure: LEFT TOTAL KNEE ARTHROPLASTY;  Surgeon: Beverely Low, MD;  Location: [facility] ORS;   Service: Orthopedics;  Laterality: Left;    Allergies  Allergen Reactions   Shingrix [Zoster Vac Recomb Adjuvanted]     Outpatient Encounter Medications as of 12/17/2023  Medication Sig   acetaminophen (TYLENOL) 325 MG tablet Take 2 tablets (650 mg total) by mouth every 6 (six) hours as needed.   alfuzosin (UROXATRAL) 10 MG 24 hr tablet Take 1 tablet (10 mg total) by mouth daily with breakfast.   amoxicillin (AMOXIL) 250 MG chewable tablet Chew 500 mg by mouth as needed. 2000mg  1x one hour prior to dental procedures.   hydrocortisone 2.5 % cream Apply topically 2 (two) times daily.   latanoprost (XALATAN) 0.005 % ophthalmic solution Place 1 drop into both eyes at bedtime.   Levothyroxine Sodium (TIROSINT) 50 MCG CAPS TAKE 1 CAPSULE DAILY BEFORE BREAKFAST   nabumetone (RELAFEN) 750 MG tablet TAKE 1 TABLET TWICE A DAY   polyethylene glycol (MIRALAX / GLYCOLAX) 17 g packet Take 17 g  by mouth daily as needed for mild constipation.   simvastatin (ZOCOR) 20 MG tablet TAKE 1 TABLET AT BEDTIME   Timolol Maleate, Once-Daily, 0.5 % SOLN Apply 1 drop to eye daily. Left eye every morning   [DISCONTINUED] DUPIXENT 300 MG/2ML SOPN INJECT 300 MG UNDER THE SKIN EVERY 14 DAYS (Patient not taking: Reported on 12/17/2023)   [DISCONTINUED] hydrocortisone 2.5 % cream Apply topically 2 (two) times daily. Apply till rash is resolved (Patient not taking: Reported on 12/17/2023)   [DISCONTINUED] nitroGLYCERIN (NITROSTAT) 0.4 MG SL tablet Place 1 tablet (0.4 mg total) under the tongue every 5 (five) minutes as needed for chest pain. (Patient not taking: Reported on 11/19/2023)   No facility-administered encounter medications on file as of 12/17/2023.    Review of Systems:  Review of Systems  Constitutional:  Negative for activity change, appetite change and unexpected weight change.  HENT: Negative.    Respiratory:  Negative for cough and shortness of breath.   Cardiovascular:  Negative for leg swelling.   Gastrointestinal:  Negative for constipation.  Genitourinary:  Negative for frequency.  Musculoskeletal:  Negative for arthralgias, gait problem and myalgias.  Skin: Negative.  Negative for rash.  Neurological:  Negative for dizziness and weakness.  Psychiatric/Behavioral:  Positive for confusion. Negative for sleep disturbance.   All other systems reviewed and are negative.   Health Maintenance  Topic Date Due   Medicare Annual Wellness (AWV)  08/26/2021   COVID-19 Vaccine (6 - 2024-25 season) 01/02/2024 (Originally 09/20/2023)   DTaP/Tdap/Td (4 - Td or Tdap) 10/12/2033   Pneumonia Vaccine 71+ Years old  Completed   INFLUENZA VACCINE  Completed   Zoster Vaccines- Shingrix  Completed   HPV VACCINES  Aged Out    Physical Exam: Vitals:   12/17/23 0953  BP: 130/82  Pulse: 73  Resp: 17  Temp: 98 F (36.7 C)  TempSrc: Temporal  SpO2: 95%  Weight: 167 lb (75.8 kg)  Height: 5\' 7"  (1.702 m)   Body mass index is 26.16 kg/m. Physical Exam Vitals reviewed.  Constitutional:      Appearance: Normal appearance.  HENT:     Head: Normocephalic.     Nose: Nose normal.     Mouth/Throat:     Mouth: Mucous membranes are moist.     Pharynx: Oropharynx is clear.  Eyes:     Pupils: Pupils are equal, round, and reactive to light.  Cardiovascular:     Rate and Rhythm: Normal rate and regular rhythm.     Pulses: Normal pulses.     Heart sounds: No murmur heard. Pulmonary:     Effort: Pulmonary effort is normal. No respiratory distress.     Breath sounds: Normal breath sounds. No rales.  Abdominal:     General: Abdomen is flat. Bowel sounds are normal.     Palpations: Abdomen is soft.  Musculoskeletal:        General: Swelling present.     Cervical back: Neck supple.  Skin:    General: Skin is warm.  Neurological:     General: No focal deficit present.     Mental Status: He is alert and oriented to person, place, and time.  Psychiatric:        Mood and Affect: Mood normal.         Thought Content: Thought content normal.       11/19/2023   11:32 AM 02/18/2018   11:14 AM 12/12/2016    2:35 PM  MMSE - Mini Mental State  Exam  Orientation to time 3 5 5   Orientation to Place 5 5 5   Registration 3 3 3   Attention/ Calculation 5 4 5   Recall 2 2 1   Language- name 2 objects 2 2 2   Language- repeat 1 1 1   Language- follow 3 step command 3 3 3   Language- read & follow direction 1 1 1   Write a sentence 1 1 1   Copy design 1 1 1   Total score 27 28 28      Labs reviewed: Basic Metabolic Panel: Recent Labs    01/17/23 0000 07/23/23 0000 09/24/23 0000  NA 137 140 138  K 4.5 4.3 4.3  CL 103 104 102  CO2 24* 24* 24*  BUN  --  30* 24*  CREATININE 1.4* 1.7* 1.3  CALCIUM 9.1 8.9 9.2  TSH 5.41 5.07  --    Liver Function Tests: Recent Labs    07/23/23 0000  AST 36  ALT 28  ALKPHOS 87  ALBUMIN 3.9   No results for input(s): "LIPASE", "AMYLASE" in the last 8760 hours. No results for input(s): "AMMONIA" in the last 8760 hours. CBC: Recent Labs    07/23/23 0000  WBC 4.6  HGB 13.0*  HCT 38*  PLT 224   Lipid Panel: Recent Labs    01/17/23 0000 07/23/23 0000  CHOL 166 204*  HDL 61 57  LDLCALC 87 131  TRIG 88 83   Lab Results  Component Value Date   HGBA1C 5.5 01/02/2016    Procedures since last visit: No results found.  Assessment/Plan 1. Cognitive impairment (Primary) Last MMSE done in Jan showed 27/30' He would benefit from frequent nursing visit by [facility] He is refusing med management and moving to [place] at this point Continues to drive and manage his finances  2. Hypothyroidism, unspecified type His TSH is 7.4 which is elevated but he has not been taking it Synthroid for past 1 year The nurses have reordered Will follow  3. Atopic dermatitis, unspecified type Has stopped Dupixent I have told him to use the hydrocortisone as needed 4. Primary osteoarthritis involving multiple joints Continues to take Relafen Has refused to  taper  5. Hyperlipidemia LDL goal <100 He would benefit from statin due to CT showing old infarcts He has not been taking it We have restarted statin  6. Bilateral leg edema He is not taking his diuretics We would not restart that  7 CKD Creatinine is better as he is off his diuretics time 8 BPH He is not taking Flomax anymore.  Continues to take Uroxatrol   Labs/tests ordered:  * No order type specified * Next appt:  01/20/2024

## 2023-12-24 ENCOUNTER — Other Ambulatory Visit: Payer: Self-pay

## 2023-12-24 MED ORDER — LEVOTHYROXINE SODIUM 50 MCG PO CAPS: ORAL_CAPSULE | ORAL | 1 refills | Status: DC

## 2024-01-13 DIAGNOSIS — H401422 Capsular glaucoma with pseudoexfoliation of lens, left eye, moderate stage: Secondary | ICD-10-CM | POA: Diagnosis not present

## 2024-01-13 DIAGNOSIS — H401111 Primary open-angle glaucoma, right eye, mild stage: Secondary | ICD-10-CM | POA: Diagnosis not present

## 2024-01-13 DIAGNOSIS — Z961 Presence of intraocular lens: Secondary | ICD-10-CM | POA: Diagnosis not present

## 2024-01-20 ENCOUNTER — Non-Acute Institutional Stay: Payer: Medicare Other | Admitting: Adult Health

## 2024-01-20 ENCOUNTER — Encounter: Payer: Self-pay | Admitting: Adult Health

## 2024-01-20 VITALS — BP 136/84 | HR 64 | Temp 97.9°F | Ht 67.0 in | Wt 164.0 lb

## 2024-01-20 DIAGNOSIS — Z0000 Encounter for general adult medical examination without abnormal findings: Secondary | ICD-10-CM | POA: Diagnosis not present

## 2024-01-20 NOTE — Patient Instructions (Addendum)
 [name] , Thank you for taking time to come for your Medicare Wellness Visit. I appreciate your ongoing commitment to your health goals. Please review the following plan we discussed and let me know if I can assist you in the future.   Screening recommendations/referrals: Colonoscopy aged out Recommended yearly ophthalmology/optometry visit for glaucoma screening and checkup Recommended yearly dental visit for hygiene and checkup  Vaccinations: Influenza vaccine due annually in September/October Pneumococcal vaccine Recommend Prevnar 20 pneumonia vaccine at your local pharmacy Tdap vaccine up to date Shingles vaccine up to date    Advanced directives: reviewed   Conditions/risks identified: Mild memory loss with recall issues.   Next appointment: 1 year  Preventive Care 88 Years and Older, Male Preventive care refers to lifestyle choices and visits with your health care provider that can promote health and wellness. What does preventive care include? A yearly physical exam. This is also called an annual well check. Dental exams once or twice a year. Routine eye exams. Ask your health care provider how often you should have your eyes checked. Personal lifestyle choices, including: Daily care of your teeth and gums. Regular physical activity. Eating a healthy diet. Avoiding tobacco and drug use. Limiting alcohol use. Practicing safe sex. Taking low doses of aspirin every day. Taking vitamin and mineral supplements as recommended by your health care provider. What happens during an annual well check? The services and screenings done by your health care provider during your annual well check will depend on your age, overall health, lifestyle risk factors, and family history of disease. Counseling  Your health care provider may ask you questions about your: Alcohol use. Tobacco use. Drug use. Emotional well-being. Home and relationship well-being. Sexual  activity. Eating habits. History of falls. Memory and ability to understand (cognition). Work and work Astronomer. Screening  You may have the following tests or measurements: Height, weight, and BMI. Blood pressure. Lipid and cholesterol levels. These may be checked every 5 years, or more frequently if you are over 88 years old. Skin check. Lung cancer screening. You may have this screening every year starting at age 88 if you have a 30-pack-year history of smoking and currently smoke or have quit within the past 15 years. Fecal occult blood test (FOBT) of the stool. You may have this test every year starting at age 88. Flexible sigmoidoscopy or colonoscopy. You may have a sigmoidoscopy every 5 years or a colonoscopy every 10 years starting at age 88. Prostate cancer screening. Recommendations will vary depending on your family history and other risks. Hepatitis C blood test. Hepatitis B blood test. Sexually transmitted disease (STD) testing. Diabetes screening. This is done by checking your blood sugar (glucose) after you have not eaten for a while (fasting). You may have this done every 1-3 years. Abdominal aortic aneurysm (AAA) screening. You may need this if you are a current or former smoker. Osteoporosis. You may be screened starting at age 88 if you are at high risk. Talk with your health care provider about your test results, treatment options, and if necessary, the need for more tests. Vaccines  Your health care provider may recommend certain vaccines, such as: Influenza vaccine. This is recommended every year. Tetanus, diphtheria, and acellular pertussis (Tdap, Td) vaccine. You may need a Td booster every 10 years. Zoster vaccine. You may need this after age 88. Pneumococcal 13-valent conjugate (PCV13) vaccine. One dose is recommended after age 88. Pneumococcal polysaccharide (PPSV23) vaccine. One dose is recommended after age  65. Talk to your health care provider about which  screenings and vaccines you need and how often you need them. This information is not intended to replace advice given to you by your health care provider. Make sure you discuss any questions you have with your health care provider. Document Released: 11/04/2015 Document Revised: 06/27/2016 Document Reviewed: 08/09/2015 Elsevier Interactive Patient Education  2017 [employer].  Fall Prevention in the Home Falls can cause injuries. They can happen to people of all ages. There are many things you can do to make your home safe and to help prevent falls. What can I do on the outside of my home? Regularly fix the edges of walkways and driveways and fix any cracks. Remove anything that might make you trip as you walk through a door, such as a raised step or threshold. Trim any bushes or trees on the path to your home. Use bright outdoor lighting. Clear any walking paths of anything that might make someone trip, such as rocks or tools. Regularly check to see if handrails are loose or broken. Make sure that both sides of any steps have handrails. Any raised decks and porches should have guardrails on the edges. Have any leaves, snow, or ice cleared regularly. Use sand or salt on walking paths during winter. Clean up any spills in your garage right away. This includes oil or grease spills. What can I do in the bathroom? Use night lights. Install grab bars by the toilet and in the tub and shower. Do not use towel bars as grab bars. Use non-skid mats or decals in the tub or shower. If you need to sit down in the shower, use a plastic, non-slip stool. Keep the floor dry. Clean up any water that spills on the floor as soon as it happens. Remove soap buildup in the tub or shower regularly. Attach bath mats securely with double-sided non-slip rug tape. Do not have throw rugs and other things on the floor that can make you trip. What can I do in the bedroom? Use night lights. Make sure that you have a  light by your bed that is easy to reach. Do not use any sheets or blankets that are too big for your bed. They should not hang down onto the floor. Have a firm chair that has side arms. You can use this for support while you get dressed. Do not have throw rugs and other things on the floor that can make you trip. What can I do in the kitchen? Clean up any spills right away. Avoid walking on wet floors. Keep items that you use a lot in easy-to-reach places. If you need to reach something above you, use a strong step stool that has a grab bar. Keep electrical cords out of the way. Do not use floor polish or wax that makes floors slippery. If you must use wax, use non-skid floor wax. Do not have throw rugs and other things on the floor that can make you trip. What can I do with my stairs? Do not leave any items on the stairs. Make sure that there are handrails on both sides of the stairs and use them. Fix handrails that are broken or loose. Make sure that handrails are as long as the stairways. Check any carpeting to make sure that it is firmly attached to the stairs. Fix any carpet that is loose or worn. Avoid having throw rugs at the top or bottom of the stairs. If you do have  throw rugs, attach them to the floor with carpet tape. Make sure that you have a light switch at the top of the stairs and the bottom of the stairs. If you do not have them, ask someone to add them for you. What else can I do to help prevent falls? Wear shoes that: Do not have high heels. Have rubber bottoms. Are comfortable and fit you well. Are closed at the toe. Do not wear sandals. If you use a stepladder: Make sure that it is fully opened. Do not climb a closed stepladder. Make sure that both sides of the stepladder are locked into place. Ask someone to hold it for you, if possible. Clearly mark and make sure that you can see: Any grab bars or handrails. First and last steps. Where the edge of each step  is. Use tools that help you move around (mobility aids) if they are needed. These include: Canes. Walkers. Scooters. Crutches. Turn on the lights when you go into a dark area. Replace any light bulbs as soon as they burn out. Set up your furniture so you have a clear path. Avoid moving your furniture around. If any of your floors are uneven, fix them. If there are any pets around you, be aware of where they are. Review your medicines with your doctor. Some medicines can make you feel dizzy. This can increase your chance of falling. Ask your doctor what other things that you can do to help prevent falls. This information is not intended to replace advice given to you by your health care provider. Make sure you discuss any questions you have with your health care provider. Document Released: 08/04/2009 Document Revised: 03/15/2016 Document Reviewed: 11/12/2014 Elsevier Interactive Patient Education  2017 [employer].

## 2024-01-20 NOTE — Progress Notes (Signed)
 Subjective:   Samuel [name] is a 89 y.o. male who presents for Medicare Annual/Subsequent preventive examination at [facility].   Visit Complete: In person  Patient Medicare AWV questionnaire was completed by the patient on 01/20/24; I have confirmed that all information answered by patient is correct and no changes since this date.  Cardiac Risk Factors include: hypertension;advanced age (>39men, >2 women)     Objective:    Today's Vitals   01/20/24 1534  BP: 136/84  Pulse: 64  Temp: 97.9 F (36.6 C)  SpO2: 96%  Weight: 164 lb (74.4 kg)  Height: 5\' 7"  (1.702 m)   Body mass index is 25.69 kg/m.     01/20/2024    3:38 PM 12/17/2023    4:16 PM 11/19/2023   11:26 AM 11/18/2023    2:57 PM 10/22/2023    1:04 PM 10/14/2023    4:00 PM 10/13/2023    6:20 PM  Advanced Directives  Does Patient Have a Medical Advance Directive? Yes Yes Yes Yes Yes No No  Type of Estate agent of [place];Living will Healthcare Power of [employer];Living will Healthcare Power of [place];Living will Healthcare Power of [place];Living will Healthcare Power of [place];Living will    Does patient want to make changes to medical advance directive? No - Patient declined No - Patient declined No - Patient declined  No - Patient declined    Copy of Healthcare Power of Attorney in Chart? No - copy requested No - copy requested No - copy requested No - copy requested No - copy requested    Would patient like information on creating a medical advance directive?  No - Patient declined No - Patient declined No - Patient declined No - Patient declined No - Patient declined No - Patient declined    Current Medications (verified) Outpatient Encounter Medications as of 01/20/2024  Medication Sig   acetaminophen (TYLENOL) 325 MG tablet Take 2 tablets (650 mg total) by mouth every 6 (six) hours as needed.   alfuzosin (UROXATRAL) 10 MG 24 hr tablet Take 1  tablet (10 mg total) by mouth daily with breakfast.   amoxicillin (AMOXIL) 250 MG chewable tablet Chew 500 mg by mouth as needed. 2000mg  1x one hour prior to dental procedures.   Dupilumab (DUPIXENT) 300 MG/2ML SOAJ Inject 300 mg into the skin once. Once a day on Thu Every 2 weeks   hydrocortisone 2.5 % cream Apply topically 2 (two) times daily.   latanoprost (XALATAN) 0.005 % ophthalmic solution Place 1 drop into both eyes at bedtime.   Levothyroxine Sodium (TIROSINT) 50 MCG CAPS TAKE 1 CAPSULE DAILY BEFORE BREAKFAST   lidocaine (LIDODERM) 5 % Place 1 patch onto the skin daily. 1, topical, once a morning.   nabumetone (RELAFEN) 750 MG tablet TAKE 1 TABLET TWICE A DAY   polyethylene glycol (MIRALAX / GLYCOLAX) 17 g packet Take 17 g by mouth daily as needed for mild constipation.   simvastatin (ZOCOR) 20 MG tablet TAKE 1 TABLET AT BEDTIME   Timolol Maleate, Once-Daily, 0.5 % SOLN Apply 1 drop to eye daily. Left eye every morning   No facility-administered encounter medications on file as of 01/20/2024.    Allergies (verified) Shingrix [zoster vac recomb adjuvanted]   History: Past Medical History:  Diagnosis Date   Aortoiliac occlusive disease (HCC)    Carotid artery disease (HCC)    Complication of anesthesia 1955   sodium pentathol ?, N/V   Contracture of palmar fascia 06/21/2010  Dysrhythmia    PAF   Enthesopathy of hip region 11/27/2009   Essential hypertension, benign 06/21/2010   GERD (gastroesophageal reflux disease)    occ   Head injury with loss of consciousness (HCC) 2001   short period   Heart murmur    Hyperlipidemia LDL goal <100 06/21/2010   Hypertrophy of prostate without urinary obstruction and other lower urinary tract symptoms (LUTS) 06/21/2010   Hypothyroidism 08/27/2011   Malignant neoplasm of prostate (HCC) 06/21/2010   Mitral regurgitation 10/07/2015   Severe MR noted on ECHO with partially flail post leaflet Oct 03 2015    Osteoarthrosis, unspecified whether  generalized or localized, unspecified site 06/21/2010   Other and unspecified hyperlipidemia 06/21/2010   Pain in joint, lower leg 02/25/2012   Palpitations 08/14/2010   Patent foramen ovale 01/04/2016   Closed at the time of mitral valve repair    PONV (postoperative nausea and vomiting)    S/P minimally invasive mitral valve repair 01/04/2016   Complex valvuloplasty including triangular resection of flail segment of posterior leaflet, artificial Gore-tex neochord placement x6 and 32mm Sorin Memo 3D ring annuloplasty via right mini thoracotomy approach with closure of PFO and clipping of LA appendage    Unspecified constipation 06/21/2010   Unspecified essential hypertension 06/21/2010   Unspecified hypothyroidism 08/27/2011   Urinary frequency 08/14/2008   Past Surgical History:  Procedure Laterality Date   CARDIAC CATHETERIZATION N/A 11/10/2015   Procedure: Right/Left Heart Cath and Coronary Angiography;  Surgeon: Peter M Swaziland, MD;  Location: [facility] INVASIVE CV LAB;  Service: Cardiovascular;  Laterality: N/A;   CATARACT EXTRACTION W/ INTRAOCULAR LENS  IMPLANT, BILATERAL  2006   CLIPPING OF ATRIAL APPENDAGE N/A 01/04/2016   Procedure: CLIPPING OF ATRIAL APPENDAGE;  Surgeon: Purcell Nails, MD;  Location: [facility] OR;  Service: Open Heart Surgery;  Laterality: N/A;   COLONOSCOPY     CORONARY ANGIOPLASTY  2017   Dr Swaziland   INGUINAL HERNIA REPAIR Left 12/19/2020   Procedure: LEFT OPEN INGUINAL HERNIA REPAIR WITHOUT MESH;  Surgeon: Kinsinger, De Blanch, MD;  Location: [facility] [place];  Service: General;  Laterality: Left;  ROOM 3 STARTING AT 11:30AM FOR 60 MIN   KNEE ARTHROSCOPY WITH MENISCAL REPAIR Left 1968   MITRAL VALVE REPAIR Right 01/04/2016   Procedure: MINIMALLY INVASIVE MITRAL VALVE REPAIR (MVR);  Surgeon: Purcell Nails, MD;  Location: [facility] OR;  Service: Open Heart Surgery;  Laterality: Right;   Open knee- ligament repair Right 1955   Dr. Sandra Cockayne   PROSTATE SURGERY  2000   seed  implants   REVERSE SHOULDER ARTHROPLASTY Right 08/12/2020   Procedure: REVERSE SHOULDER ARTHROPLASTY;  Surgeon: Francena Hanly, MD;  Location: [facility] OR;  Service: Orthopedics;  Laterality: Right;    TEE WITHOUT CARDIOVERSION N/A 11/10/2015   Procedure: TRANSESOPHAGEAL ECHOCARDIOGRAM (TEE);  Surgeon: Chilton Si, MD;  Location: [facility] ENDOSCOPY;  Service: Cardiovascular;  Laterality: N/A;   TEE WITHOUT CARDIOVERSION N/A 01/04/2016   Procedure: TRANSESOPHAGEAL ECHOCARDIOGRAM (TEE);  Surgeon: Purcell Nails, MD;  Location: [facility] OR;  Service: Open Heart Surgery;  Laterality: N/A;   TOTAL KNEE ARTHROPLASTY Left 06/08/2022   Procedure: LEFT TOTAL KNEE ARTHROPLASTY;  Surgeon: Beverely Low, MD;  Location: [facility] ORS;  Service: Orthopedics;  Laterality: Left;   Family History  Problem Relation Age of Onset   Heart disease Mother        myocardial infarction   Heart disease Father        myocardial infarction   Social  History   Socioeconomic History   Marital status: Married    Spouse name: Not on file   Number of children: Not on file   Years of education: Not on file   Highest education level: Not on file  Occupational History   Occupation: retired Multimedia programmer at [employer]  Tobacco Use   Smoking status: Some Days    Current packs/day: 0.00    Average packs/day: 0.5 packs/day for 5.0 years (2.5 ttl pk-yrs)    Types: Cigarettes, Pipe, Cigars    Start date: 09/04/1964    Last attempt to quit: 09/04/1969    Years since quitting: 54.4   Smokeless tobacco: Never  Vaping Use   Vaping status: Never Used  Substance and Sexual Activity   Alcohol use: Yes    Alcohol/week: 14.0 standard drinks of alcohol    Types: 14 Shots of liquor per week    Comment: 2 drinks per day   Drug use: No   Sexual activity: Not Currently  Other Topics Concern   Not on file  Social History Narrative   Lives at [place] since 05/2010   Married Aurea Graff    Has living will, POA   Retired from [employer]  after 29 years   Stopped smoking 1970   Exercise run 1 1/2 mile every other day, swim 15 minutes every other day, use machines in gym   Alcohol 2 drinks daily   Social Drivers of Corporate investment banker Strain: Low Risk  (02/18/2018)   Overall Financial Resource Strain (CARDIA)    Difficulty of Paying Living Expenses: Not hard at all  Food Insecurity: No Food Insecurity (01/20/2024)   Hunger Vital Sign    Worried About Running Out of Food in the Last Year: Never true    Ran Out of Food in the Last Year: Never true  Transportation Needs: No Transportation Needs (01/20/2024)   PRAPARE - Administrator, Civil Service (Medical): No    Lack of Transportation (Non-Medical): No  Physical Activity: Insufficiently Active (02/18/2018)   Exercise Vital Sign    Days of Exercise per Week: 4 days    Minutes of Exercise per Session: 30 min  Stress: Stress Concern Present (02/18/2018)   [employer] of Occupational Health - Occupational Stress Questionnaire    Feeling of Stress : To some extent  Social Connections: Moderately Integrated (02/18/2018)   Social Connection and Isolation Panel [NHANES]    Frequency of Communication with Friends and Family: More than three times a week    Frequency of Social Gatherings with Friends and Family: More than three times a week    Attends Religious Services: More than 4 times per year    Active Member of [employer] or Organizations: No    Attends Engineer, structural: Never    Marital Status: Married    Tobacco Counseling Ready to quit: Not Answered Counseling given: Not Answered   Clinical Intake:  Pre-visit preparation completed: No  Pain : No/denies pain     BMI - recorded: 25.69 Nutritional Status: BMI 25 -29 Overweight Diabetes: No  How often do you need to have someone help you when you read instructions, pamphlets, or other written materials from your doctor or pharmacy?: 1 - Never What is the last grade level you  completed in school?: College PhD  Interpreter Needed?: No  Information entered by :: Fletcher Anon NP   Activities of Daily Living    01/20/2024    3:54 PM  01/20/2024    3:35 PM  In your present state of health, do you have any difficulty performing the following activities:  Hearing? 0 0  Vision? 1 0  Difficulty concentrating or making decisions? 1 1  Walking or climbing stairs? 0 0  Dressing or bathing? 0 0  Doing errands, shopping? 0 0  Preparing Food and eating ? N N  Using the Toilet? N N  In the past six months, have you accidently leaked urine? N N  Do you have problems with loss of bowel control? N N  Managing your Medications? N N  Managing your Finances? N N  Housekeeping or managing your Housekeeping? N N    Patient Care Team: Mahlon Gammon, MD as PCP - General (Internal Medicine) Jake Bathe, MD as PCP - Cardiology (Cardiology) [facility] Krell, Fredrik Cove, NP as Nurse Practitioner (Geriatric Medicine) Othella Boyer, MD as Consulting Physician (Cardiology) Heloise Purpura, MD as Consulting Physician (Urology)  Indicate any recent Medical Services you may have received from other than [facility] providers in the past year (date may be approximate).     Assessment:   This is a routine wellness examination for Samuel [name].  Hearing/Vision screen Vision Screening - Comments:: Dr. Eliot Ford Last Exam: 12/2023   Goals Addressed             This Visit's Progress    Exercise 3x per week (30 min per time)   On track    Exercise every other day of the week      Patient Stated       Continue to be active and increase frequency of swimming 2-3 times a week        Depression Screen    01/20/2024    3:39 PM 11/19/2023   11:26 AM 10/22/2023    1:04 PM 07/29/2023    2:35 PM 01/22/2023    1:53 PM 05/02/2022   10:25 AM 11/30/2020    1:41 PM  PHQ 2/9 Scores  PHQ - 2 Score 0 0 0 0 0 0 0    Fall Risk    01/20/2024    3:39 PM 11/19/2023    11:25 AM 10/22/2023    1:03 PM 07/29/2023    2:35 PM 01/22/2023    1:53 PM  Fall Risk   Falls in the past year? 0 1 1 0 0  Number falls in past yr: 0 0 1 0 0  Injury with Fall? 0 1 1 0 0  Risk for fall due to : No Fall Risks History of fall(s);Impaired balance/gait Impaired balance/gait;Impaired mobility;History of fall(s) No Fall Risks   Follow up Falls evaluation completed  Falls evaluation completed Falls evaluation completed Falls evaluation completed    MEDICARE RISK AT HOME:    TIMED UP AND GO:  Was the test performed?  No    Cognitive Function:    11/19/2023   11:32 AM 02/18/2018   11:14 AM 12/12/2016    2:35 PM 11/02/2015    2:58 PM  MMSE - Mini Mental State Exam  Orientation to time 3 5 5 5   Orientation to Place 5 5 5 5   Registration 3 3 3 3   Attention/ Calculation 5 4 5 5   Recall 2 2 1 1   Language- name 2 objects 2 2 2 2   Language- repeat 1 1 1 1   Language- follow 3 step command 3 3 3 3   Language- read & follow direction 1 1 1 1   Write  a sentence 1 1 1 1   Copy design 1 1 1 1   Total score 27 28 28 28         01/20/2024    3:39 PM 08/26/2020    2:56 PM 08/20/2019    4:02 PM  6CIT Screen  What Year? 0 points 0 points 0 points  What month? 0 points 0 points 0 points  What time? 0 points 0 points 0 points  Count back from 20 0 points 0 points 0 points  Months in reverse 0 points 0 points 0 points  Repeat phrase 6 points 0 points 4 points  Total Score 6 points 0 points 4 points    Immunizations Immunization History  Administered Date(s) Administered   Fluad Quad(high Dose 65+) 07/23/2022, 08/03/2022   Fluad Trivalent(High Dose 65+) 07/26/2023   Influenza, High Dose Seasonal PF 08/16/2017, 08/22/2020   Influenza,inj,Quad PF,6+ Mos 07/20/2016, 08/15/2018, 07/31/2019   Influenza-Unspecified 07/27/2013, 07/07/2014, 08/11/2015, 08/11/2021   Moderna Covid-19 Vaccine Bivalent Booster 42yrs & up 10/04/2021   Moderna SARS-COV2 Booster Vaccination 05/26/2021    Moderna Sars-Covid-2 Vaccination 11/30/2018, 11/03/2019, 09/06/2020   Pfizer(Comirnaty)Fall Seasonal Vaccine 12 years and older 07/26/2023   Pneumococcal Conjugate-13 10/26/2014   Pneumococcal Polysaccharide-23 11/23/2015   Td 10/23/2007   Tdap 02/28/2015, 10/13/2023   Zoster Recombinant(Shingrix) 12/03/2016, 03/06/2017   Zoster, Live 10/22/2008    TDAP status: Up to date  Flu Vaccine status: Up to date  Pneumococcal vaccine status: Due, Education has been provided regarding the importance of this vaccine. Advised may receive this vaccine at local pharmacy or Health Dept. Aware to provide a copy of the vaccination record if obtained from local pharmacy or Health Dept. Verbalized acceptance and understanding.  Covid-19 vaccine status: Completed vaccines  Qualifies for Shingles Vaccine? Yes   Zostavax completed Yes   Shingrix Completed?: Yes  Screening Tests Health Maintenance  Topic Date Due   COVID-19 Vaccine (6 - Moderna risk 2024-25 season) 01/24/2024   Medicare Annual Wellness (AWV)  01/19/2025   DTaP/Tdap/Td (4 - Td or Tdap) 10/12/2033   Pneumonia Vaccine 34+ Years old  Completed   INFLUENZA VACCINE  Completed   Zoster Vaccines- Shingrix  Completed   HPV VACCINES  Aged Out    Health Maintenance  Health Maintenance Due  Topic Date Due   COVID-19 Vaccine (6 - Moderna risk 2024-25 season) 01/24/2024    Colorectal cancer screening: No longer required.   Lung Cancer Screening: (Low Dose CT Chest recommended if Age 30-80 years, 20 pack-year currently smoking OR have quit w/in 15years.) does not qualify.   Lung Cancer Screening Referral: NA  Additional Screening:  Hepatitis C Screening: does not qualify; Completed NA  Vision Screening: Recommended annual ophthalmology exams for early detection of glaucoma and other disorders of the eye. Is the patient up to date with their annual eye exam?  Yes  Who is the provider or what is the name of the office in which the  patient attends annual eye exams? [facility] If pt is not established with a provider, would they like to be referred to a provider to establish care? No .   Dental Screening: Recommended annual dental exams for proper oral hygiene  Diabetic Foot Exam NA  Community Resource Referral / Chronic Care Management: CRR required this visit?  No   CCM required this visit?  No     Plan:     I have personally reviewed and noted the following in the patient's chart:   Medical and social history  Use of alcohol, tobacco or illicit drugs  Current medications and supplements including opioid prescriptions. Patient is not currently taking opioid prescriptions. Functional ability and status Nutritional status Physical activity Advanced directives List of other physicians Hospitalizations, surgeries, and ER visits in previous 12 months Vitals Screenings to include cognitive, depression, and falls Referrals and appointments  In addition, I have reviewed and discussed with patient certain preventive protocols, quality metrics, and best practice recommendations. A written personalized care plan for preventive services as well as general preventive health recommendations were provided to patient.     Fletcher Anon, NP   01/20/2024   After Visit Summary: (In Person-Printed) AVS printed and given to the patient  Nurse Notes: NA

## 2024-02-27 DIAGNOSIS — H00011 Hordeolum externum right upper eyelid: Secondary | ICD-10-CM | POA: Diagnosis not present

## 2024-02-27 DIAGNOSIS — H10501 Unspecified blepharoconjunctivitis, right eye: Secondary | ICD-10-CM | POA: Diagnosis not present

## 2024-03-23 ENCOUNTER — Encounter: Payer: Medicare Other | Admitting: Adult Health

## 2024-03-24 ENCOUNTER — Encounter: Admitting: Internal Medicine

## 2024-03-31 ENCOUNTER — Ambulatory Visit: Admitting: Internal Medicine

## 2024-04-11 NOTE — Progress Notes (Signed)
 A user error has taken place.

## 2024-04-28 ENCOUNTER — Encounter: Payer: Self-pay | Admitting: Internal Medicine

## 2024-04-28 ENCOUNTER — Ambulatory Visit: Admitting: Internal Medicine

## 2024-04-28 VITALS — BP 124/78 | HR 79 | Temp 97.9°F | Ht 67.5 in | Wt 161.4 lb

## 2024-04-28 DIAGNOSIS — L209 Atopic dermatitis, unspecified: Secondary | ICD-10-CM | POA: Diagnosis not present

## 2024-04-28 DIAGNOSIS — E785 Hyperlipidemia, unspecified: Secondary | ICD-10-CM

## 2024-04-28 DIAGNOSIS — N1832 Chronic kidney disease, stage 3b: Secondary | ICD-10-CM

## 2024-04-28 DIAGNOSIS — R600 Localized edema: Secondary | ICD-10-CM | POA: Diagnosis not present

## 2024-04-28 DIAGNOSIS — M150 Primary generalized (osteo)arthritis: Secondary | ICD-10-CM | POA: Diagnosis not present

## 2024-04-28 DIAGNOSIS — R7989 Other specified abnormal findings of blood chemistry: Secondary | ICD-10-CM

## 2024-04-28 MED ORDER — NABUMETONE 750 MG PO TABS: 750.0000 mg | ORAL_TABLET | Freq: Every day | ORAL | Status: AC

## 2024-04-28 NOTE — Patient Instructions (Addendum)
 Try Relafen  every other day. Watch for symptoms like pain and swelling in your finger.  You can go back to once a day if that happens Can use Hydrocortisone  ointment for itching Follow up with [facility]

## 2024-04-28 NOTE — Progress Notes (Signed)
 Location:   [facility]   Place of Service:  Clinic   Provider:   Code Status: Full COde Goals of Care:     01/20/2024    3:38 PM  Advanced Directives  Does Patient Have a Medical Advance Directive? Yes  Type of Estate agent of [place];Living will  Does patient want to make changes to medical advance directive? No - Patient declined  Copy of Healthcare Power of Attorney in Chart? No - copy requested     Chief Complaint  Patient presents with   chronic itching    Itching on shoulder area on left side. Going on for about 9 months. Problems hearing, requesting hearing test. ( Possible hearing aids). Discuss need for getting labs. Discuss MOST/DNR. Discuss covid vaccine. Concerned about being dehydrated.    HPI: Patient is a 89 y.o. male seen today for medical management of chronic diseases.    Patient came with his daughter today  [place] lives in [place] in [place] Daughter lives in [place]  but has a place in [place] and comes and check on him often He is still driving short distance Cognitive decline Patient has had recent Cognitive decline He still driving and doing his Finances  CT in ED in 10/15/23 Moderate Atrophy with chronic microvascular ischemic changes. 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  Daughter going to provide more oversight on him He stopped his statin himself And His  Synthroid   LE edema He was on Diuretic at one point Stopped it him self Does not want to restart it  He has h/o Arthritis Takes Relafen  for past 8-9 years. Tapering has not helped Still taking it ? Once a day  Rash  Was diagnosed with Atopic dermatitis by Elita and was doing well on Dupixent  Also stopped himself Hearing loss Per daughter wants his Ear examined by Audiology Daughter also wanted to talk about DNR form and MOST Form  BPH with Frequency  Does take Uroxtal  Past Medical History:  Diagnosis Date   Aortoiliac occlusive disease (HCC)     Carotid artery disease (HCC)    Complication of anesthesia 1955   sodium pentathol ?, N/V   Contracture of palmar fascia 06/21/2010   Dysrhythmia    PAF   Enthesopathy of hip region 11/27/2009   Essential hypertension, benign 06/21/2010   GERD (gastroesophageal reflux disease)    occ   Head injury with loss of consciousness (HCC) 2001   short period   Heart murmur    Hyperlipidemia LDL goal <100 06/21/2010   Hypertrophy of prostate without urinary obstruction and other lower urinary tract symptoms (LUTS) 06/21/2010   Hypothyroidism 08/27/2011   Malignant neoplasm of prostate (HCC) 06/21/2010   Mitral regurgitation 10/07/2015   Severe MR noted on ECHO with partially flail post leaflet Oct 03 2015    Osteoarthrosis, unspecified whether generalized or localized, unspecified site 06/21/2010   Other and unspecified hyperlipidemia 06/21/2010   Pain in joint, lower leg 02/25/2012   Palpitations 08/14/2010   Patent foramen ovale 01/04/2016   Closed at the time of mitral valve repair    PONV (postoperative nausea and vomiting)    S/P minimally invasive mitral valve repair 01/04/2016   Complex valvuloplasty including triangular resection of flail segment of posterior leaflet, artificial Gore-tex neochord placement x6 and 32mm Sorin Memo 3D ring annuloplasty via right mini thoracotomy approach with closure of PFO and clipping of LA appendage    Unspecified constipation 06/21/2010   Unspecified essential hypertension 06/21/2010  Unspecified hypothyroidism 08/27/2011   Urinary frequency 08/14/2008    Past Surgical History:  Procedure Laterality Date   CARDIAC CATHETERIZATION N/A 11/10/2015   Procedure: Right/Left Heart Cath and Coronary Angiography;  Surgeon: Peter M Swaziland, MD;  Location: [facility] INVASIVE CV LAB;  Service: Cardiovascular;  Laterality: N/A;   CATARACT EXTRACTION W/ INTRAOCULAR LENS  IMPLANT, BILATERAL  2006   CLIPPING OF ATRIAL APPENDAGE N/A 01/04/2016   Procedure: CLIPPING OF ATRIAL  APPENDAGE;  Surgeon: Sudie VEAR Laine, MD;  Location: [facility] OR;  Service: Open Heart Surgery;  Laterality: N/A;   COLONOSCOPY     CORONARY ANGIOPLASTY  2017   Dr Swaziland   INGUINAL HERNIA REPAIR Left 12/19/2020   Procedure: LEFT OPEN INGUINAL HERNIA REPAIR WITHOUT MESH;  Surgeon: Kinsinger, Herlene Righter, MD;  Location: [facility];  Service: General;  Laterality: Left;  ROOM 3 STARTING AT 11:30AM FOR 60 MIN   KNEE ARTHROSCOPY WITH MENISCAL REPAIR Left 1968   MITRAL VALVE REPAIR Right 01/04/2016   Procedure: MINIMALLY INVASIVE MITRAL VALVE REPAIR (MVR);  Surgeon: Sudie VEAR Laine, MD;  Location: [facility] OR;  Service: Open Heart Surgery;  Laterality: Right;   Open knee- ligament repair Right 1955   Dr. Dessa   PROSTATE SURGERY  2000   seed implants   REVERSE SHOULDER ARTHROPLASTY Right 08/12/2020   Procedure: REVERSE SHOULDER ARTHROPLASTY;  Surgeon: Melita Drivers, MD;  Location: [facility] OR;  Service: Orthopedics;  Laterality: Right;    TEE WITHOUT CARDIOVERSION N/A 11/10/2015   Procedure: TRANSESOPHAGEAL ECHOCARDIOGRAM (TEE);  Surgeon: Annabella Scarce, MD;  Location: [facility] ENDOSCOPY;  Service: Cardiovascular;  Laterality: N/A;   TEE WITHOUT CARDIOVERSION N/A 01/04/2016   Procedure: TRANSESOPHAGEAL ECHOCARDIOGRAM (TEE);  Surgeon: Sudie VEAR Laine, MD;  Location: [facility] OR;  Service: Open Heart Surgery;  Laterality: N/A;   TOTAL KNEE ARTHROPLASTY Left 06/08/2022   Procedure: LEFT TOTAL KNEE ARTHROPLASTY;  Surgeon: Kay Kemps, MD;  Location: [facility] ORS;  Service: Orthopedics;  Laterality: Left;    Allergies  Allergen Reactions   Shingrix [Zoster Vac Recomb Adjuvanted]     Outpatient Encounter Medications as of 04/28/2024  Medication Sig   acetaminophen  (TYLENOL ) 325 MG tablet Take 2 tablets (650 mg total) by mouth every 6 (six) hours as needed.   alfuzosin  (UROXATRAL ) 10 MG 24 hr tablet Take 1 tablet (10 mg total) by mouth daily with breakfast.   amoxicillin  (AMOXIL ) 250 MG chewable tablet Chew  500 mg by mouth as needed. 2000mg  1x one hour prior to dental procedures.   hydrocortisone  2.5 % cream Apply topically 2 (two) times daily.   latanoprost (XALATAN) 0.005 % ophthalmic solution Place 1 drop into both eyes at bedtime.   polyethylene glycol (MIRALAX  / GLYCOLAX ) 17 g packet Take 17 g by mouth daily as needed for mild constipation.   Timolol Maleate, Once-Daily, 0.5 % SOLN Apply 1 drop to eye daily. Left eye every morning   [DISCONTINUED] nabumetone  (RELAFEN ) 750 MG tablet TAKE 1 TABLET TWICE A DAY   nabumetone  (RELAFEN ) 750 MG tablet Take 1 tablet (750 mg total) by mouth daily.   [DISCONTINUED] Levothyroxine  Sodium (TIROSINT ) 50 MCG CAPS TAKE 1 CAPSULE DAILY BEFORE BREAKFAST (Patient not taking: Reported on 04/28/2024)   [DISCONTINUED] lidocaine  (LIDODERM ) 5 % Place 1 patch onto the skin daily. 1, topical, once a morning. (Patient not taking: Reported on 04/28/2024)   [DISCONTINUED] simvastatin  (ZOCOR ) 20 MG tablet TAKE 1 TABLET AT BEDTIME (Patient not taking: Reported on 04/28/2024)   No facility-administered encounter medications on  file as of 04/28/2024.    Review of Systems:  Review of Systems  Constitutional:  Negative for activity change, appetite change and unexpected weight change.  HENT: Negative.    Respiratory:  Negative for cough and shortness of breath.   Cardiovascular:  Positive for leg swelling.  Gastrointestinal:  Negative for constipation.  Genitourinary:  Negative for frequency.  Musculoskeletal:  Negative for arthralgias, gait problem and myalgias.  Skin:  Positive for rash.  Neurological:  Negative for dizziness and weakness.  Psychiatric/Behavioral:  Positive for confusion. Negative for sleep disturbance.   All other systems reviewed and are negative.   Health Maintenance  Topic Date Due   COVID-19 Vaccine (6 - Moderna risk 2024-25 season) 01/24/2024   INFLUENZA VACCINE  05/22/2024   Medicare Annual Wellness (AWV)  01/19/2025   DTaP/Tdap/Td (4 - Td or Tdap)  10/12/2033   Pneumococcal Vaccine: 50+ Years  Completed   Zoster Vaccines- Shingrix  Completed   Hepatitis B Vaccines  Aged Out   HPV VACCINES  Aged Out   Meningococcal B Vaccine  Aged Out    Physical Exam: Vitals:   04/28/24 1109  BP: 124/78  Pulse: 79  Temp: 97.9 F (36.6 C)  SpO2: 93%  Weight: 161 lb 6.4 oz (73.2 kg)  Height: 5' 7.5 (1.715 m)   Body mass index is 24.91 kg/m. Physical Exam Vitals reviewed.  Constitutional:      Appearance: Normal appearance.  HENT:     Head: Normocephalic.     Nose: Nose normal.     Mouth/Throat:     Mouth: Mucous membranes are moist.     Pharynx: Oropharynx is clear.  Eyes:     Pupils: Pupils are equal, round, and reactive to light.  Cardiovascular:     Rate and Rhythm: Normal rate and regular rhythm.     Pulses: Normal pulses.     Heart sounds: No murmur heard. Pulmonary:     Effort: Pulmonary effort is normal. No respiratory distress.     Breath sounds: Normal breath sounds. No rales.  Abdominal:     General: Abdomen is flat. Bowel sounds are normal.     Palpations: Abdomen is soft.  Musculoskeletal:        General: Swelling present.     Cervical back: Neck supple.  Skin:    General: Skin is warm.     Comments: Dermatitis in Left shoulder area  Neurological:     General: No focal deficit present.     Mental Status: He is alert and oriented to person, place, and time.  Psychiatric:        Mood and Affect: Mood normal.        Thought Content: Thought content normal.       11/19/2023   11:32 AM 02/18/2018   11:14 AM 12/12/2016    2:35 PM  MMSE - Mini Mental State Exam  Orientation to time 3 5 5    Orientation to Place 5 5 5    Registration 3 3 3    Attention/ Calculation 5 4 5    Recall 2 2 1    Language- name 2 objects 2 2 2    Language- repeat 1 1 1   Language- follow 3 step command 3 3 3    Language- read & follow direction 1 1 1    Write a sentence 1 1 1    Copy design 1 1 1    Total score 27 28 28       Data saved  with a previous flowsheet row  definition     Labs reviewed: Basic Metabolic Panel: Recent Labs    07/23/23 0000 09/24/23 0000 12/05/23 0000  NA 140 138  --   K 4.3 4.3  --   CL 104 102  --   CO2 24* 24*  --   BUN 30* 24*  --   CREATININE 1.7* 1.3  --   CALCIUM 8.9 9.2  --   TSH 5.07  --  7.44*   Liver Function Tests: Recent Labs    07/23/23 0000  AST 36  ALT 28  ALKPHOS 87  ALBUMIN  3.9   No results for input(s): LIPASE, AMYLASE in the last 8760 hours. No results for input(s): AMMONIA in the last 8760 hours. CBC: Recent Labs    07/23/23 0000 12/05/23 0000  WBC 4.6 3.9  HGB 13.0* 12.7*  HCT 38* 38*  PLT 224 236   Lipid Panel: Recent Labs    07/23/23 0000 12/05/23 0000  CHOL 204* 196  HDL 57 72*  LDLCALC 131 108  TRIG 83 81   Lab Results  Component Value Date   HGBA1C 5.5 01/02/2016    Procedures since last visit: No results found.  Assessment/Plan Assessment and Plan    Chronic Kidney Disease, Stage 3 Moderate stage 3 CKD likely exacerbated by long-term NSAID use. Monitoring required, no nephrology referral needed. - Reduce Relafen  to once daily./Every other day - Consider acetaminophen  for pain.  Arthritis Chronic arthritis managed with reduced Relafen  due to kidney concerns. Discussed acetaminophen  as alternative. - Try Relafen  Every other day  - Use acetaminophen  for pain. - Monitor for pain, stiffness, swelling.  Atopic Dermatitis Intermittent itching and mild redness managed with hydrocortisone . Discussed topical vs systemic treatment. Was on Dupixent  per Derm Has stopped it  - Apply hydrocortisone  cream twice daily. - Refer to dermatology if symptoms worsen or persist.  Memory Decline/Cognitive impairment Mild age-related memory decline. No significant functional or safety issues. Family involved in management. - Monitor memory and daily functioning. - Family to manage finances and legal matters.  H/o Glaucoma Regular  ophthalmology follow-up required.  General Health Maintenance Emphasized social engagement and physical activity. - Maintain regular physical activity.  Goals of Care Discussed advance directives and DNR preferences. At this time patient does want to be Full Code. D/w Daughter in Room  Completed MOST form for full treatment scope without long-term feeding tube. - Document MOST form. - Discuss and document advance directives with family.  Hearing Assessment Scheduled hearing test. Discussed potential need for hearing aids. - Schedule hearing test with [facility]when they come [facility] - Evaluate need for hearing aids based on results.      Elevated TSH At this time we will just monitor instead of restarting Synthroid  He has been off it for many months  HLD He has also stopped his Statin HDL is good LDL mildily elevated Daughter agreed to minimize his meds    Labs/tests ordered:  * No order type specified * Next appt:  Visit date not found

## 2024-06-22 ENCOUNTER — Other Ambulatory Visit: Payer: Self-pay | Admitting: Internal Medicine

## 2024-06-23 NOTE — Telephone Encounter (Signed)
 Medication has never been prescribed by pcp Charlanne Fredia CROME, MD Sent to patients pcp for further review.

## 2024-06-23 NOTE — Telephone Encounter (Signed)
 Noted

## 2024-06-29 NOTE — Telephone Encounter (Signed)
 Copied from CRM #[id number]. Topic: Clinical - Medication Question >> Jun 29, 2024 10:57 AM Miquel SAILOR wrote: Reason for CRM: Joesph from [facility] [phone] Ref [id number]  Levothyroxine  Sodium 50 MCG CAPS [[id number]]  DISCONTINUED-calling to see if prescribed again for Pt . Needs call back     Please Advise if Medication has been DISCONTINUED.

## 2024-07-21 DIAGNOSIS — Z23 Encounter for immunization: Secondary | ICD-10-CM | POA: Diagnosis not present

## 2024-07-24 DIAGNOSIS — M150 Primary generalized (osteo)arthritis: Secondary | ICD-10-CM | POA: Diagnosis not present

## 2024-07-24 DIAGNOSIS — R413 Other amnesia: Secondary | ICD-10-CM | POA: Diagnosis not present

## 2024-07-24 DIAGNOSIS — R2689 Other abnormalities of gait and mobility: Secondary | ICD-10-CM | POA: Diagnosis not present

## 2024-07-24 DIAGNOSIS — Z9181 History of falling: Secondary | ICD-10-CM | POA: Diagnosis not present

## 2024-07-24 DIAGNOSIS — R41841 Cognitive communication deficit: Secondary | ICD-10-CM | POA: Diagnosis not present

## 2024-07-24 DIAGNOSIS — M6281 Muscle weakness (generalized): Secondary | ICD-10-CM | POA: Diagnosis not present

## 2024-07-24 DIAGNOSIS — R296 Repeated falls: Secondary | ICD-10-CM | POA: Diagnosis not present

## 2024-07-28 ENCOUNTER — Encounter: Admitting: Internal Medicine

## 2024-07-28 ENCOUNTER — Encounter: Payer: Self-pay | Admitting: Internal Medicine

## 2024-07-28 DIAGNOSIS — R413 Other amnesia: Secondary | ICD-10-CM | POA: Diagnosis not present

## 2024-07-28 DIAGNOSIS — R2689 Other abnormalities of gait and mobility: Secondary | ICD-10-CM | POA: Diagnosis not present

## 2024-07-28 DIAGNOSIS — Z9181 History of falling: Secondary | ICD-10-CM | POA: Diagnosis not present

## 2024-07-28 DIAGNOSIS — R41841 Cognitive communication deficit: Secondary | ICD-10-CM | POA: Diagnosis not present

## 2024-07-28 DIAGNOSIS — M150 Primary generalized (osteo)arthritis: Secondary | ICD-10-CM | POA: Diagnosis not present

## 2024-07-28 DIAGNOSIS — M6281 Muscle weakness (generalized): Secondary | ICD-10-CM | POA: Diagnosis not present

## 2024-08-04 DIAGNOSIS — Z9181 History of falling: Secondary | ICD-10-CM | POA: Diagnosis not present

## 2024-08-04 DIAGNOSIS — M150 Primary generalized (osteo)arthritis: Secondary | ICD-10-CM | POA: Diagnosis not present

## 2024-08-04 DIAGNOSIS — R2689 Other abnormalities of gait and mobility: Secondary | ICD-10-CM | POA: Diagnosis not present

## 2024-08-04 DIAGNOSIS — R413 Other amnesia: Secondary | ICD-10-CM | POA: Diagnosis not present

## 2024-08-04 DIAGNOSIS — R41841 Cognitive communication deficit: Secondary | ICD-10-CM | POA: Diagnosis not present

## 2024-08-04 DIAGNOSIS — M6281 Muscle weakness (generalized): Secondary | ICD-10-CM | POA: Diagnosis not present

## 2024-08-07 DIAGNOSIS — M150 Primary generalized (osteo)arthritis: Secondary | ICD-10-CM | POA: Diagnosis not present

## 2024-08-07 DIAGNOSIS — R2689 Other abnormalities of gait and mobility: Secondary | ICD-10-CM | POA: Diagnosis not present

## 2024-08-07 DIAGNOSIS — R41841 Cognitive communication deficit: Secondary | ICD-10-CM | POA: Diagnosis not present

## 2024-08-07 DIAGNOSIS — Z9181 History of falling: Secondary | ICD-10-CM | POA: Diagnosis not present

## 2024-08-07 DIAGNOSIS — R413 Other amnesia: Secondary | ICD-10-CM | POA: Diagnosis not present

## 2024-08-07 DIAGNOSIS — M6281 Muscle weakness (generalized): Secondary | ICD-10-CM | POA: Diagnosis not present

## 2024-08-14 DIAGNOSIS — R2689 Other abnormalities of gait and mobility: Secondary | ICD-10-CM | POA: Diagnosis not present

## 2024-08-14 DIAGNOSIS — M150 Primary generalized (osteo)arthritis: Secondary | ICD-10-CM | POA: Diagnosis not present

## 2024-08-14 DIAGNOSIS — R413 Other amnesia: Secondary | ICD-10-CM | POA: Diagnosis not present

## 2024-08-14 DIAGNOSIS — Z9181 History of falling: Secondary | ICD-10-CM | POA: Diagnosis not present

## 2024-08-14 DIAGNOSIS — R41841 Cognitive communication deficit: Secondary | ICD-10-CM | POA: Diagnosis not present

## 2024-08-14 DIAGNOSIS — M6281 Muscle weakness (generalized): Secondary | ICD-10-CM | POA: Diagnosis not present

## 2024-08-27 NOTE — Progress Notes (Signed)
 A user error has taken place.

## 2024-09-22 ENCOUNTER — Non-Acute Institutional Stay: Admitting: Internal Medicine

## 2024-09-22 ENCOUNTER — Encounter: Payer: Self-pay | Admitting: Internal Medicine

## 2024-09-22 VITALS — BP 128/80 | HR 74 | Temp 97.9°F | Ht 67.5 in | Wt 162.0 lb

## 2024-09-22 DIAGNOSIS — E785 Hyperlipidemia, unspecified: Secondary | ICD-10-CM | POA: Diagnosis not present

## 2024-09-22 DIAGNOSIS — R600 Localized edema: Secondary | ICD-10-CM

## 2024-09-22 DIAGNOSIS — L209 Atopic dermatitis, unspecified: Secondary | ICD-10-CM

## 2024-09-22 DIAGNOSIS — M150 Primary generalized (osteo)arthritis: Secondary | ICD-10-CM

## 2024-09-22 DIAGNOSIS — R4189 Other symptoms and signs involving cognitive functions and awareness: Secondary | ICD-10-CM

## 2024-09-22 DIAGNOSIS — R7989 Other specified abnormal findings of blood chemistry: Secondary | ICD-10-CM | POA: Diagnosis not present

## 2024-09-22 DIAGNOSIS — N1832 Chronic kidney disease, stage 3b: Secondary | ICD-10-CM | POA: Diagnosis not present

## 2024-09-22 NOTE — Progress Notes (Unsigned)
 Location:   [facility]   Place of Service:  Clinic   Provider:   Code Status:  Goals of Care:     01/20/2024    3:38 PM  Advanced Directives  Does Patient Have a Medical Advance Directive? Yes  Type of Estate Agent of [place];Living will  Does patient want to make changes to medical advance directive? No - Patient declined  Copy of Healthcare Power of Attorney in Chart? No - copy requested     Chief Complaint  Patient presents with   Acute Visit   Follow-up    HPI: Patient is a 89 y.o. male seen today for medical management of chronic diseases.    [place] in [place] Daughter lives in [place]  but has a place in [place] and comes and check on him often He is still driving short distance Cognitive decline Recent decline in his cognition Refuses to move to [place] He still driving and doing his Finances  CT in ED in 10/15/23 Moderate Atrophy with chronic microvascular ischemic changes. 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   Daughter going to provide more oversight on him He stopped his statin himself And His  Synthroid    LE edema He was on Diuretic at one point Stopped it him self Does not want to restart it   He has h/o Arthritis Takes Relafen  for past 8-9 years. Tapering has not helped Still taking it ? Once a day Now has run out of it and had no Issues today   Rash  Was diagnosed with Atopic dermatitis by Elita and was doing well on Dupixent  Also stopped himself Hearing loss   BPH with Frequency  Doe snot take any meds      Past Medical History:  Diagnosis Date   Aortoiliac occlusive disease (HCC)    Carotid artery disease    Complication of anesthesia 1955   sodium pentathol ?, N/V   Contracture of palmar fascia 06/21/2010   Dysrhythmia    PAF   Enthesopathy of hip region 11/27/2009   Essential hypertension, benign 06/21/2010   GERD (gastroesophageal reflux disease)    occ   Head injury with loss of consciousness  (HCC) 2001   short period   Heart murmur    Hyperlipidemia LDL goal <100 06/21/2010   Hypertrophy of prostate without urinary obstruction and other lower urinary tract symptoms (LUTS) 06/21/2010   Hypothyroidism 08/27/2011   Malignant neoplasm of prostate (HCC) 06/21/2010   Mitral regurgitation 10/07/2015   Severe MR noted on ECHO with partially flail post leaflet Oct 03 2015    Osteoarthrosis, unspecified whether generalized or localized, unspecified site 06/21/2010   Other and unspecified hyperlipidemia 06/21/2010   Pain in joint, lower leg 02/25/2012   Palpitations 08/14/2010   Patent foramen ovale 01/04/2016   Closed at the time of mitral valve repair    PONV (postoperative nausea and vomiting)    S/P minimally invasive mitral valve repair 01/04/2016   Complex valvuloplasty including triangular resection of flail segment of posterior leaflet, artificial Gore-tex neochord placement x6 and 32mm Sorin Memo 3D ring annuloplasty via right mini thoracotomy approach with closure of PFO and clipping of LA appendage    Unspecified constipation 06/21/2010   Unspecified essential hypertension 06/21/2010   Unspecified hypothyroidism 08/27/2011   Urinary frequency 08/14/2008    Past Surgical History:  Procedure Laterality Date   CARDIAC CATHETERIZATION N/A 11/10/2015   Procedure: Right/Left Heart Cath and Coronary Angiography;  Surgeon: Peter M Jordan,  MD;  Location: [facility] INVASIVE CV LAB;  Service: Cardiovascular;  Laterality: N/A;   CATARACT EXTRACTION W/ INTRAOCULAR LENS  IMPLANT, BILATERAL  2006   CLIPPING OF ATRIAL APPENDAGE N/A 01/04/2016   Procedure: CLIPPING OF ATRIAL APPENDAGE;  Surgeon: Sudie VEAR Laine, MD;  Location: [facility] OR;  Service: Open Heart Surgery;  Laterality: N/A;   COLONOSCOPY     CORONARY ANGIOPLASTY  2017   Dr Jordan   INGUINAL HERNIA REPAIR Left 12/19/2020   Procedure: LEFT OPEN INGUINAL HERNIA REPAIR WITHOUT MESH;  Surgeon: Kinsinger, Herlene Righter, MD;  Location: [facility];  Service: General;  Laterality: Left;  ROOM 3 STARTING AT 11:30AM FOR 60 MIN   KNEE ARTHROSCOPY WITH MENISCAL REPAIR Left 1968   MITRAL VALVE REPAIR Right 01/04/2016   Procedure: MINIMALLY INVASIVE MITRAL VALVE REPAIR (MVR);  Surgeon: Sudie VEAR Laine, MD;  Location: [facility] OR;  Service: Open Heart Surgery;  Laterality: Right;   Open knee- ligament repair Right 1955   Dr. Dessa   PROSTATE SURGERY  2000   seed implants   REVERSE SHOULDER ARTHROPLASTY Right 08/12/2020   Procedure: REVERSE SHOULDER ARTHROPLASTY;  Surgeon: Melita Drivers, MD;  Location: [facility] OR;  Service: Orthopedics;  Laterality: Right;    TEE WITHOUT CARDIOVERSION N/A 11/10/2015   Procedure: TRANSESOPHAGEAL ECHOCARDIOGRAM (TEE);  Surgeon: Annabella Scarce, MD;  Location: [facility] ENDOSCOPY;  Service: Cardiovascular;  Laterality: N/A;   TEE WITHOUT CARDIOVERSION N/A 01/04/2016   Procedure: TRANSESOPHAGEAL ECHOCARDIOGRAM (TEE);  Surgeon: Sudie VEAR Laine, MD;  Location: [facility] OR;  Service: Open Heart Surgery;  Laterality: N/A;   TOTAL KNEE ARTHROPLASTY Left 06/08/2022   Procedure: LEFT TOTAL KNEE ARTHROPLASTY;  Surgeon: Kay Kemps, MD;  Location: [facility] ORS;  Service: Orthopedics;  Laterality: Left;    Allergies  Allergen Reactions   Shingrix [Zoster Vac Recomb Adjuvanted]     Outpatient Encounter Medications as of 09/22/2024  Medication Sig   acetaminophen  (TYLENOL ) 325 MG tablet Take 2 tablets (650 mg total) by mouth every 6 (six) hours as needed.   alfuzosin  (UROXATRAL ) 10 MG 24 hr tablet Take 1 tablet (10 mg total) by mouth daily with breakfast.   amoxicillin  (AMOXIL ) 250 MG chewable tablet Chew 500 mg by mouth as needed. 2000mg  1x one hour prior to dental procedures.   latanoprost (XALATAN) 0.005 % ophthalmic solution Place 1 drop into both eyes at bedtime.   nabumetone  (RELAFEN ) 750 MG tablet Take 1 tablet (750 mg total) by mouth daily.   polyethylene glycol (MIRALAX  / GLYCOLAX ) 17 g packet Take 17 g by mouth daily as  needed for mild constipation.   Timolol Maleate, Once-Daily, 0.5 % SOLN Apply 1 drop to eye daily. Left eye every morning   hydrocortisone  2.5 % cream Apply topically 2 (two) times daily. (Patient not taking: Reported on 09/22/2024)   No facility-administered encounter medications on file as of 09/22/2024.    Review of Systems:  Review of Systems  Constitutional:  Negative for activity change, appetite change and unexpected weight change.  HENT: Negative.    Respiratory:  Negative for cough and shortness of breath.   Cardiovascular:  Negative for leg swelling.  Gastrointestinal:  Negative for constipation.  Genitourinary:  Negative for frequency.  Musculoskeletal:  Negative for arthralgias, gait problem and myalgias.  Skin: Negative.  Negative for rash.  Neurological:  Negative for dizziness and weakness.  Psychiatric/Behavioral:  Positive for confusion. Negative for sleep disturbance.   All other systems reviewed and are negative.   Health Maintenance  Topic Date Due   COVID-19 Vaccine (7 - Moderna risk 2025-26 season) 01/18/2025   Medicare Annual Wellness (AWV)  01/19/2025   DTaP/Tdap/Td (4 - Td or Tdap) 10/12/2033   Pneumococcal Vaccine: 50+ Years  Completed   Influenza Vaccine  Completed   Zoster Vaccines- Shingrix  Completed   Meningococcal B Vaccine  Aged Out    Physical Exam: Vitals:   09/22/24 0833  BP: 128/80  Pulse: 74  Temp: 97.9 F (36.6 C)  SpO2: 95%  Weight: 162 lb (73.5 kg)  Height: 5' 7.5 (1.715 m)   Body mass index is 25 kg/m. Physical Exam Vitals reviewed.  Constitutional:      Appearance: Normal appearance.  HENT:     Head: Normocephalic.     Nose: Nose normal.     Mouth/Throat:     Mouth: Mucous membranes are moist.     Pharynx: Oropharynx is clear.  Eyes:     Pupils: Pupils are equal, round, and reactive to light.  Cardiovascular:     Rate and Rhythm: Normal rate and regular rhythm.     Pulses: Normal pulses.     Heart sounds: No murmur  heard. Pulmonary:     Effort: Pulmonary effort is normal. No respiratory distress.     Breath sounds: Normal breath sounds. No rales.  Abdominal:     General: Abdomen is flat. Bowel sounds are normal.     Palpations: Abdomen is soft.  Musculoskeletal:        General: Swelling present.     Cervical back: Neck supple.  Skin:    General: Skin is warm.  Neurological:     General: No focal deficit present.     Mental Status: He is alert and oriented to person, place, and time.     Comments: Passed Clock Drawing  Psychiatric:        Mood and Affect: Mood normal.        Thought Content: Thought content normal.     Labs reviewed: Basic Metabolic Panel: Recent Labs    09/24/23 0000 12/05/23 0000  NA 138  --   K 4.3  --   CL 102  --   CO2 24*  --   BUN 24*  --   CREATININE 1.3  --   CALCIUM 9.2  --   TSH  --  7.44*   Liver Function Tests: No results for input(s): AST, ALT, ALKPHOS, BILITOT, PROT, ALBUMIN  in the last 8760 hours. No results for input(s): LIPASE, AMYLASE in the last 8760 hours. No results for input(s): AMMONIA in the last 8760 hours. CBC: Recent Labs    12/05/23 0000  WBC 3.9  HGB 12.7*  HCT 38*  PLT 236   Lipid Panel: Recent Labs    12/05/23 0000  CHOL 196  HDL 72*  LDLCALC 108  TRIG 81   Lab Results  Component Value Date   HGBA1C 5.5 01/02/2016    Procedures since last visit: No results found.  Assessment/Plan 1. Elevated TSH (Primary) Was on Synthroid  Stopped himself Check TSH  2. Primary osteoarthritis involving multiple joints Ran out of relafen  Not having any issues Will not do refill Can uses tylenol  PRn  3. Hyperlipidemia LDL goal <100 Off statin now  4. Bilateral leg edema Off Torsemide   5. Stage 3b chronic kidney disease (HCC) due to his Chronic NSAID uses Repeat Labs   6. Cognitive impairment Worsening He did Draw the Clock And Passed  7. Atopic dermatitis, unspecified type Off Dupixent   Not  having any issues    Labs/tests ordered:  Next week Labs TSH ,CBC,CMP Next appt:  01/25/2025

## 2024-10-01 DIAGNOSIS — Z130 Encounter for screening for diseases of the blood and blood-forming organs and certain disorders involving the immune mechanism: Secondary | ICD-10-CM | POA: Diagnosis not present

## 2024-10-01 DIAGNOSIS — R946 Abnormal results of thyroid function studies: Secondary | ICD-10-CM | POA: Diagnosis not present

## 2024-10-01 LAB — CBC AND DIFFERENTIAL
HCT: 39 — AB (ref 41–53)
Hemoglobin: 13 — AB (ref 13.5–17.5)
Platelets: 248 K/uL (ref 150–400)
WBC: 4.1

## 2024-10-01 LAB — CBC: RBC: 4.2 (ref 3.87–5.11)

## 2024-10-06 LAB — HEPATIC FUNCTION PANEL
ALT: 29 U/L (ref 10–40)
AST: 33 (ref 14–40)
Alkaline Phosphatase: 100 (ref 25–125)
Bilirubin, Direct: 0.179 (ref 0.01–0.4)
Bilirubin, Total: 0.5

## 2024-10-06 LAB — COMPREHENSIVE METABOLIC PANEL WITH GFR
Albumin: 4 (ref 3.5–5.0)
Calcium: 9.1 (ref 8.7–10.7)
Globulin: 2.9
eGFR: 53

## 2024-10-06 LAB — BASIC METABOLIC PANEL WITH GFR
BUN: 23 — AB (ref 4–21)
CO2: 24 — AB (ref 13–22)
Chloride: 104 (ref 99–108)
Creatinine: 1.3 (ref 0.6–1.3)
Glucose: 98
Potassium: 4.3 meq/L (ref 3.5–5.1)
Sodium: 137 (ref 137–147)

## 2024-10-06 LAB — TSH: TSH: 9.25 — AB (ref 0.41–5.90)

## 2024-12-01 ENCOUNTER — Encounter: Admitting: Internal Medicine

## 2025-01-25 ENCOUNTER — Ambulatory Visit: Payer: Self-pay | Admitting: Adult Health

## 2025-03-22 ENCOUNTER — Encounter: Admitting: Adult Health
